# Patient Record
Sex: Female | Born: 1950 | Race: White | Hispanic: No | Marital: Married
Health system: Midwestern US, Community
[De-identification: ages and names within clinical notes are randomized; demographics above are authoritative.]

## PROBLEM LIST (undated history)

## (undated) DIAGNOSIS — R011 Cardiac murmur, unspecified: Secondary | ICD-10-CM

## (undated) DIAGNOSIS — L89154 Pressure ulcer of sacral region, stage 4: Secondary | ICD-10-CM

## (undated) DIAGNOSIS — I509 Heart failure, unspecified: Secondary | ICD-10-CM

## (undated) DIAGNOSIS — I10 Essential (primary) hypertension: Secondary | ICD-10-CM

## (undated) DIAGNOSIS — C541 Malignant neoplasm of endometrium: Secondary | ICD-10-CM

## (undated) DIAGNOSIS — E119 Type 2 diabetes mellitus without complications: Secondary | ICD-10-CM

## (undated) DIAGNOSIS — M47816 Spondylosis without myelopathy or radiculopathy, lumbar region: Secondary | ICD-10-CM

## (undated) DIAGNOSIS — Z978 Presence of other specified devices: Secondary | ICD-10-CM

## (undated) DIAGNOSIS — F32A Depression, unspecified: Secondary | ICD-10-CM

## (undated) DIAGNOSIS — F329 Major depressive disorder, single episode, unspecified: Secondary | ICD-10-CM

## (undated) DIAGNOSIS — Z8669 Personal history of other diseases of the nervous system and sense organs: Secondary | ICD-10-CM

## (undated) DIAGNOSIS — I5023 Acute on chronic systolic (congestive) heart failure: Secondary | ICD-10-CM

## (undated) DIAGNOSIS — F419 Anxiety disorder, unspecified: Secondary | ICD-10-CM

## (undated) DIAGNOSIS — D649 Anemia, unspecified: Secondary | ICD-10-CM

## (undated) DIAGNOSIS — I7789 Other specified disorders of arteries and arterioles: Secondary | ICD-10-CM

## (undated) DIAGNOSIS — G4733 Obstructive sleep apnea (adult) (pediatric): Secondary | ICD-10-CM

## (undated) DIAGNOSIS — Z960 Presence of urogenital implants: Secondary | ICD-10-CM

## (undated) DIAGNOSIS — R609 Edema, unspecified: Secondary | ICD-10-CM

## (undated) DIAGNOSIS — K760 Fatty (change of) liver, not elsewhere classified: Secondary | ICD-10-CM

## (undated) DIAGNOSIS — G8220 Paraplegia, unspecified: Secondary | ICD-10-CM

## (undated) HISTORY — PX: TONSILLECTOMY: SUR1361

## (undated) HISTORY — DX: Pressure ulcer of sacral region, stage 4: L89.154

## (undated) HISTORY — PX: DILATION AND CURETTAGE OF UTERUS: SHX78

## (undated) HISTORY — DX: Other specified disorders of arteries and arterioles: I77.89

## (undated) HISTORY — PX: JOINT REPLACEMENT: SHX530

## (undated) HISTORY — PX: FRACTURE SURGERY: SHX138

---

## 1999-10-01 DIAGNOSIS — C541 Malignant neoplasm of endometrium: Secondary | ICD-10-CM

## 1999-10-01 HISTORY — DX: Malignant neoplasm of endometrium: C54.1

## 1999-10-01 HISTORY — PX: VAGINAL HYSTERECTOMY: SUR661

## 2003-10-01 HISTORY — PX: FEMUR FRACTURE SURGERY: SHX633

## 2013-09-14 LAB — URINALYSIS W/ RFLX MICROSCOPIC
Bilirubin: NEGATIVE
Glucose: NEGATIVE mg/dL
Ketone: NEGATIVE mg/dL
Nitrites: POSITIVE — AB
Protein: NEGATIVE mg/dL
Specific gravity: 1.015 (ref 1.005–1.030)
Urobilinogen: 0.2 EU/dL (ref 0.1–1.0)
pH (UA): 6.5 (ref 4.5–8.0)

## 2013-09-14 LAB — METABOLIC PANEL, COMPREHENSIVE
A-G Ratio: 1.1 (ref 1.0–1.5)
ALT (SGPT): 39 U/L (ref 12–78)
AST (SGOT): 21 U/L (ref 15–37)
Albumin: 4.4 g/dL (ref 3.4–5.0)
Alk. phosphatase: 86 U/L (ref 50–136)
Anion gap: 11 mmol/L (ref 8–20)
BUN: 24 mg/dL — ABNORMAL HIGH (ref 7–18)
Bilirubin, total: 1.1 mg/dL — ABNORMAL HIGH (ref 0.2–1.0)
CO2: 28 mmol/L (ref 21–32)
Calcium: 11.9 mg/dL — ABNORMAL HIGH (ref 8.5–10.1)
Chloride: 102 mmol/L (ref 98–107)
Creatinine: 0.9 mg/dL (ref 0.6–1.3)
GFR est AA: >60 mL/min/{1.73_m2} (ref >60)
GFR est non-AA: >60 mL/min/{1.73_m2} (ref >60)
Globulin: 4 g/dL (ref 2.5–5.0)
Glucose: 137 mg/dL — ABNORMAL HIGH (ref 74–106)
Potassium: 4 mmol/L (ref 3.5–5.1)
Protein, total: 8.4 g/dL — ABNORMAL HIGH (ref 6.4–8.2)
Sodium: 141 mmol/L (ref 136–145)

## 2013-09-14 LAB — TSH 3RD GENERATION: TSH: 2.67 u[IU]/mL (ref 0.360–3.740)

## 2013-09-14 LAB — LIPID PANEL
CHOL/HDL Ratio: 3.7
Cholesterol, total: 191 mg/dL (ref <200)
HDL Cholesterol: 51 mg/dL (ref 40–60)
LDL, calculated: 113 MG/DL (ref 0–130)
Triglyceride: 135 mg/dL (ref <150)
VLDL, calculated: 27 MG/DL (ref 3–35)

## 2013-09-14 LAB — CBC W/O DIFF
HCT: 50.3 % — ABNORMAL HIGH (ref 37.0–47.0)
HGB: 16.5 g/dL — ABNORMAL HIGH (ref 12.0–16.0)
MCH: 28.3 PG (ref 27.0–31.0)
MCHC: 32.8 g/dL (ref 30.5–36.0)
MCV: 86.3 FL (ref 81.0–100.0)
MPV: 11.6 FL (ref 10.2–12.7)
PLATELET: 182 10*3/uL (ref 122–400)
RBC: 5.83 M/uL — ABNORMAL HIGH (ref 4.20–5.40)
RDW: 14.8 % — ABNORMAL HIGH (ref 11.4–14.6)
WBC: 11.3 10*3/uL — ABNORMAL HIGH (ref 4.8–10.8)

## 2013-09-14 LAB — URINE MICROSCOPIC

## 2013-09-14 LAB — VITAMIN D, 25 HYDROXY: Vitamin D 25-Hydroxy: 17.8 ng/mL

## 2013-09-14 LAB — HEMOGLOBIN A1C WITH EAG: Hemoglobin A1c: 5.2 % (ref 4.2–6.3)

## 2013-09-14 LAB — VITAMIN B12: Vitamin B12: 1403 pg/mL — ABNORMAL HIGH (ref 193–986)

## 2013-09-22 ENCOUNTER — Encounter

## 2013-10-03 LAB — PHOSPHORUS: Phosphorus: 2.7 mg/dL (ref 2.5–4.9)

## 2013-10-03 LAB — METABOLIC PANEL, BASIC
Anion gap: 8 mmol/L (ref 8–20)
BUN: 20 mg/dL — ABNORMAL HIGH (ref 7–18)
CO2: 28 mmol/L (ref 21–32)
Calcium: 11.2 mg/dL — ABNORMAL HIGH (ref 8.5–10.1)
Chloride: 106 mmol/L (ref 98–107)
Creatinine: 0.8 mg/dL (ref 0.6–1.3)
GFR est AA: >60 mL/min/{1.73_m2} (ref >60)
GFR est non-AA: >60 mL/min/{1.73_m2} (ref >60)
Glucose: 117 mg/dL — ABNORMAL HIGH (ref 74–106)
Potassium: 4.1 mmol/L (ref 3.5–5.1)
Sodium: 142 mmol/L (ref 136–145)

## 2013-10-03 LAB — MAGNESIUM: Magnesium: 2.1 mg/dL (ref 1.8–2.4)

## 2013-10-03 MED ORDER — ACETAMINOPHEN 325 MG TABLET
325 mg | Freq: Four times a day (QID) | ORAL | Status: DC | PRN
Start: 2013-10-03 — End: 2013-10-03
  Administered 2013-10-03: 20:00:00 via ORAL

## 2013-10-03 MED ORDER — IBUPROFEN 400 MG TAB
400 mg | Freq: Once | ORAL | Status: AC
Start: 2013-10-03 — End: 2013-10-03
  Administered 2013-10-03: 20:00:00 via ORAL

## 2013-10-03 NOTE — ED Notes (Signed)
Patient ate 100 percent of lunch.

## 2013-10-03 NOTE — ED Notes (Addendum)
Patient is awake, alert, and oriented, speech is clear and patient is and ready for discharge. Report to medics.  Verbal and written discharge instructions provided and patient  has the cognitive understanding of discharge instructions. Discharged home with medics via ambulance. All questions answered.

## 2013-10-03 NOTE — ED Notes (Signed)
Patient received in ED 3 from EMS.  Patient states that she has been having trouble with her knees for the past 2 weeks.  Patient states that "they feel like jelly".  Patient states that she fell last night while in the bathroom and injured the toes of her left foot and her left hand.  Patient states that EMS was called to her house and they were able to help her up - but today the pain was worse and her son called 911 for patient to be evaluated in the ED.  Patient being seen by MD at this time.

## 2013-10-03 NOTE — ED Notes (Signed)
Pt is resting on stretcher.  eval by md.  xrays ordered.
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
## 2013-10-03 NOTE — ED Notes (Signed)
Dr. Link SnufferXie.  Was at bedside to re eval.  Plan is for pt to go home via ambulance.  [facility]  Will be here in 30 min.
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
## 2013-10-04 NOTE — Progress Notes (Signed)
Spoke with Dr. Link SnufferXie who saw patient yesterday in ER, request that a homecare referral be done.  Patient has [employer], [employer] participating, referral done and faxed to [facility] for visiting nurse and home PT. I spoke with Sheri Fry and made him aware that referral was sent and gave him phone number for [employer].

## 2013-10-18 ENCOUNTER — Inpatient Hospital Stay
Admit: 2013-10-18 | Discharge: 2013-10-22 | Disposition: A | Discharge status: Skilled Nursing Facility | Payer: BLUE CROSS/BLUE SHIELD | Attending: Internal Medicine | Admitting: Internal Medicine

## 2013-10-18 DIAGNOSIS — M6281 Muscle weakness (generalized): Secondary | ICD-10-CM

## 2013-10-18 LAB — METABOLIC PANEL, COMPREHENSIVE
A-G Ratio: 1.1 (ref 1.0–1.5)
ALT (SGPT): 37 U/L (ref 12–78)
AST (SGOT): 32 U/L (ref 15–37)
Albumin: 4.2 g/dL (ref 3.4–5.0)
Alk. phosphatase: 121 U/L (ref 50–136)
Anion gap: 11 mmol/L (ref 8–20)
BUN: 19 mg/dL — ABNORMAL HIGH (ref 7–18)
Bilirubin, total: 1 mg/dL (ref 0.2–1.0)
CO2: 26 mmol/L (ref 21–32)
Calcium: 11.6 mg/dL — ABNORMAL HIGH (ref 8.5–10.1)
Chloride: 103 mmol/L (ref 98–107)
Creatinine: 1.1 mg/dL (ref 0.6–1.3)
GFR est AA: >60 mL/min/{1.73_m2} (ref >60)
GFR est non-AA: 54 mL/min/{1.73_m2} — ABNORMAL LOW (ref >60)
Globulin: 3.9 g/dL (ref 2.5–5.0)
Glucose: 132 mg/dL — ABNORMAL HIGH (ref 74–106)
Potassium: 4.3 mmol/L (ref 3.5–5.1)
Protein, total: 8.1 g/dL (ref 6.4–8.2)
Sodium: 140 mmol/L (ref 136–145)

## 2013-10-18 LAB — CBC WITH AUTOMATED DIFF
ABS. BASOPHILS: 0.4 10*3/uL
ABS. EOSINOPHILS: 0.3 10*3/uL
ABS. LYMPHOCYTES: 5.5 10*3/uL
ABS. MONOCYTES: 0.6 10*3/uL
ABS. NEUTROPHILS: 6.1 10*3/uL
ATYPICAL LYMPHS: 4 %
BASOPHILS: 3 % — ABNORMAL HIGH (ref 0–1)
EOSINOPHILS: 2 % (ref 0–2)
HCT: 48.8 % — ABNORMAL HIGH (ref 37.0–47.0)
HGB: 16.1 g/dL — ABNORMAL HIGH (ref 12.0–16.0)
LYMPHOCYTES: 39 % (ref 21–51)
MCH: 28.4 PG (ref 27.0–31.0)
MCHC: 33 g/dL (ref 30.5–36.0)
MCV: 86.2 FL (ref 81.0–100.0)
MONOCYTES: 5 % (ref 2–9)
MPV: 11.3 FL (ref 10.2–12.7)
NEUTROPHILS: 47 % (ref 42–75)
PLATELET ESTIMATE: ADEQUATE
PLATELET: 202 10*3/uL (ref 122–400)
RBC: 5.66 M/uL — ABNORMAL HIGH (ref 4.20–5.40)
RDW: 14.9 % — ABNORMAL HIGH (ref 11.4–14.6)
WBC: 12.9 10*3/uL — ABNORMAL HIGH (ref 4.8–10.8)

## 2013-10-18 LAB — BNP: BNP: 16 pg/mL (ref 0–100)

## 2013-10-18 NOTE — Progress Notes (Signed)
Pt received from ED via stretcher. Alert and oriented x3. Vitals stable. Eating and resting comfortably. Will continue to monitor.

## 2013-10-18 NOTE — Other (Addendum)
TRANSFER - IN REPORT:    Verbal report received from Susan McDowell(name) on Sheri Fry  being received from ED(unit) for routine progression of care      Report consisted of patient???s Situation, Background, Assessment and   Recommendations(SBAR).     Information from the following report(s) SBAR, Kardex and MAR was reviewed with the receiving nurse.    Opportunity for questions and clarification was provided.      Assessment completed upon patient???s arrival to unit and care assumed.

## 2013-10-18 NOTE — ED Notes (Signed)
Bedside and Verbal shift change report given to Lucius ConnEmily Kearns, RN (oncoming nurse) by Holli Humblesandy Pinkham, RN (offgoing nurse). Report included the following information SBAR and ED Summary.

## 2013-10-18 NOTE — H&P (Signed)
[facility]ator[facility] Systems  [facility]    History and Physical Examination      NAME:  Sheri FilbertRosita Fry   DOB:   [birth year]   MRN:   [id number]     Date/Time:  10/18/2013  ________________________________________________________    Chief Complaint:  Leg Pain  .  Primary Care Provider: Jabier GaussShuang-ping Wang, MD MD  History of Present Illness:  This is a 63 y.o. year old female with a h/o morbid obesity, HTN, high cholesterol, OSAHS, hypokalemia, lymphedema,  and vit D deficiency presented with c/o inability to ambulate and do minimal chores. As per pt and husband pt has been almost bed bound for the last month and has had difficulties using the commode. Her quality of life has been quickly deteriorating, and she had been depressed. Has been gaining weight.  Denies SOB, no cough, no CP or wheezing.  No n/v/d. No f/c/s.  No dizziness. Pt lives with husband who is a wheel chair bound.      Past Medical History:   has a past medical history of Hypertension; Obesity; High cholesterol; Hypokalemia; Vitamin D deficiency; and Sleep apnea.   Surgical History:  Past Surgical History   Procedure Laterality Date   ??? Hx hip fracture tx Right 2006   ??? Hx femur fracture tx Bilateral 2006      Social History:  The patient  reports that she has never smoked. She does not have any smokeless tobacco history on file. She reports that  drinks alcohol. She reports that she does not use illicit drugs.   Family History:  family history is not on file.    Home Medications:  Prior to Admission Medications   Outpatient Medications Last Dose Informant Patient Reported? Taking?   aspirin delayed-release 81 mg tablet   Yes No   Sig: Take 81 mg by mouth daily.   furosemide (LASIX) 40 mg tablet   Yes No   Sig: Take 40 mg by mouth daily.   metoprolol (LOPRESSOR) 100 mg tablet   Yes No   Sig: Take 100 mg by mouth daily.   potassium chloride SR (KLOR-CON 10) 10 mEq tablet   Yes No   Sig: Take 20 mEq by mouth two (2) times a day.    simvastatin (ZOCOR) 10 mg tablet   Yes No   Sig: Take 10 mg by mouth nightly.      Facility-Administered Medications: None       Review of Systems:  Constitutional:  No fever,chills, or night sweats. + Fatigue and weight gain.   HEENT:  No vision changes or headaches. No hearing loss  Respiratory:   No cough, no audible wheeze, respirations regular.  Cardiovascular:  No palpitations.  Gastrointestinal:  No vomiting, diarrhea or constipation.   Genitourinary:  No dysuria or hematuria.  Metabolic/Endocrine:  No polyuria, polydypsia, or polyphagia. No cold/ heat intolerance.  Neuro/Psychiatric:  No dizziness, no emotional disturbances.      Physical Examination:       General alert, well appearing, and in no distress.  Obese.    Vital signs  Blood pressure 184/74, pulse 78, temperature 98.3 ??F (36.8 ??C), resp. rate 20, height 5\' 2"  (1.575 m), weight 121.564 kg (268 lb), SpO2 97.00%.    Mental status alert, oriented to person, place, and time    Neck No JVD, Neck is short, supple and thick , no significant adenopathy    Chest clear to auscultation, no wheezes, rales or rhonchi, symmetric air  entry    Heart normal rate, regular rhythm, normal S1, S2, no murmurs, rubs, clicks or gallops    Abdomen Obese. Soft, nontender, nondistended, no masses or organomegaly    Neurological alert, oriented, normal speech, no focal findings or movement disorder noted    Extremity peripheral pulses normal,, no clubbing or cyanosis.  Bilateral Lymphedema (non-pitting.     Skin normal coloration and turgor, no rashes, no suspicious skin lesions noted        LABS    Labs: Results:       Chemistry Recent Labs      10/18/13   1800   GLU  132*   NA  140   K  4.3   CL  103   CO2  26   BUN  19*   CREA  1.1   CA  11.6*   AGAP  11   AP  121   TP  8.1   ALB  4.2   GLOB  3.9   AGRAT  1.1      CBC w/Diff Recent Labs      10/18/13   1800   WBC  12.9*   RBC  5.66*   HGB  16.1*   HCT  48.8*   PLT  202   GRANS  47   LYMPH  39   EOS  2      Cardiac  Enzymes No results found for this basename: CPK, CKRMB, CKND1, TROIP, MYO,  in the last 72 hours   Coagulation No results found for this basename: PTP, INR, APTT,  in the last 72 hours    Liver Enzymes Recent Labs      10/18/13   1800   TP  8.1   ALB  4.2   AP  121   SGOT  32      Urine Analysis Color   Date Value Range Status   09/14/2013 YELLOW  YEL   Final        Appearance   Date Value Range Status   09/14/2013 HAZY* CLEAR   Final        pH (UA)   Date Value Range Status   09/14/2013 6.5  4.5 - 8.0   Final        Protein   Date Value Range Status   09/14/2013 NEGATIVE   NEG mg/dL Final        Ketone   Date Value Range Status   09/14/2013 NEGATIVE   NEG mg/dL Final        Bilirubin   Date Value Range Status   09/14/2013 NEGATIVE   NEG   Final        Blood   Date Value Range Status   09/14/2013 TRACE* NEG   Final        Urobilinogen   Date Value Range Status   09/14/2013 0.2  0.1 - 1.0 EU/dL Final        Nitrites   Date Value Range Status   09/14/2013 POSITIVE* NEG   Final        Leukocyte Esterase   Date Value Range Status   09/14/2013 MODERATE* NEG   Final      BNP Recent Labs      10/18/13   1800   BNPP  16                imaging:      Results from Hospital Encounter encounter on 10/03/13   XR KNEE  LT 3 V   Narrative Radiographs of the LEFT knee     performed on 10/03/2013 11:37 AM     for patient      female of 62 years    CLINICAL INFORMATION:  Pain     TECHNIQUE:  Frontal, lateral and oblique views of the knee were obtained.    FINDINGS:  No prior exams are available for comparison    Intramedullary rod in the femur.  No fracture is seen.  No joint effusion is   found.  No loose body is identified.  Medial and patellofemoral compartments are  narrowed.  Patellar spurring is.      There are dystrophic calcifications in the soft tissues below the knee..  No   radiopaque foreign body is seen.           Impression IMPRESSION: No fracture demonstrated.      THIS DOCUMENT HAS BEEN ELECTRONICALLY SIGNED BY Viviann Spare  LEFFLER MD        Assessment/Plan:   Active Problems:    * No active hospital problems. *   This is a 63 y.o. year old female with a h/o morbid obesity, HTN, high cholesterol, OSAHS, hypokalemia, lymphedema,  and vit D deficiency, now with:  1. Debility, deconditioning,  and deteriorating QOL.  2. Morbid obesity.  3. OSAHS.  4. HTN.  5. Hypercholesterolemia.  6. DJD    Plan:  Admit to observation for placement as family could not take care of patient and pt is at high risk of fall.   Will use BiPAP to sleep with 16/8. Pt will benefit from NPSG and CPAP titration as an out patient.  Restart ASA, Lopressor, Lasix and simvastatin.  Low Na diet.  DVT prophylaxis.                Prophylaxis:  [] Lovenox  [] Coumadin  [x] Hep SQ  [x] SCD???s  [] H2B/PPI      Care Plan discussed with:    [x] Patient   [x] Family    [] Care Manager    [] Nursing   [] Consultant/Specialist :      Disposition:  [] Home w/ Family   [] HH PT,OT,RN   [] SNF/LTC   [x] SAH/Rehab        Dede Query, MD  October 18, 2013

## 2013-10-18 NOTE — ED Notes (Signed)
Pt c/o difficulty walking over past month, currently being seen by pt

## 2013-10-18 NOTE — Other (Signed)
TRANSFER - OUT REPORT:    Verbal report given to Jenness Corneriffany Oszmanski on [facility] of [facility]       Report consisted of patient???s Situation, Background, Assessment and   Recommendations(SBAR).     Information from the following report(s) SBAR was reviewed with the receiving nurse.    Opportunity for questions and clarification was provided.

## 2013-10-18 NOTE — ED Provider Notes (Signed)
HPI Comments: Patient states she has been having problems ambulating in the home and has been extremely depressed due to that    Patient is a 63 y.o. female presenting with leg pain. The history is provided by the patient. No language interpreter was used.   Leg Pain   This is a recurrent problem. The problem occurs constantly. The problem has been rapidly worsening. The pain is present in the left lower leg and right lower leg. The quality of the pain is described as aching. The pain is mild. Pertinent negatives include no numbness and no back pain.        Past Medical History   Diagnosis Date   ??? Hypertension    ??? Obesity    ??? High cholesterol    ??? Hypokalemia    ??? Vitamin D deficiency    ??? Sleep apnea         Past Surgical History   Procedure Laterality Date   ??? Hx hip fracture tx Right 2006   ??? Hx femur fracture tx Bilateral 2006         History reviewed. No pertinent family history.     History     Social History   ??? Marital Status: MARRIED     Spouse Name: N/A     Number of Children: N/A   ??? Years of Education: N/A     Occupational History   ??? Not on file.     Social History Main Topics   ??? Smoking status: Never Smoker    ??? Smokeless tobacco: Not on file   ??? Alcohol Use: Yes      Comment: Rare   ??? Drug Use: No   ??? Sexually Active: Not on file     Other Topics Concern   ??? Not on file     Social History Narrative   ??? No narrative on file                  ALLERGIES: Pcn      Review of Systems   Constitutional: Positive for fatigue. Negative for chills and appetite change.   HENT: Negative for congestion, sore throat, postnasal drip and sinus pressure.    Eyes: Negative for pain and discharge.   Respiratory: Negative.  Negative for cough, chest tightness and shortness of breath.    Cardiovascular: Negative.  Negative for chest pain, palpitations and leg swelling.   Gastrointestinal: Negative.  Negative for nausea, vomiting, abdominal pain, diarrhea and constipation.   Endocrine: Negative for cold intolerance and  polydipsia.   Genitourinary: Negative for dysuria, frequency and flank pain.   Musculoskeletal: Positive for joint swelling and gait problem. Negative for back pain.        Both leg swelling and pain     Skin: Negative for rash and wound.   Neurological: Negative for dizziness, light-headedness, numbness and headaches.   Psychiatric/Behavioral: Negative for confusion. The patient is not nervous/anxious.    All other systems reviewed and are negative.        Filed Vitals:    10/18/13 1756   BP: 184/74   Pulse: 78   Temp: 98.3 ??F (36.8 ??C)   Resp: 20   Height: 5' 2"  (1.575 m)   Weight: 121.564 kg (268 lb)   SpO2: 97%            Physical Exam   Nursing note and vitals reviewed.  Constitutional: She is oriented to person, place, and time. She appears well-developed  and well-nourished.   HENT:   Head: Normocephalic and atraumatic.   Eyes: Conjunctivae and EOM are normal.   Neck: Neck supple.   Pulmonary/Chest: Effort normal and breath sounds normal. No respiratory distress. She has no wheezes. She has no rales. She exhibits no tenderness.   Abdominal: Soft. Bowel sounds are normal. She exhibits no distension and no mass. There is no tenderness. There is no rebound and no guarding.   Musculoskeletal: Normal range of motion. She exhibits edema and tenderness.   3+ edema of bilateral legs,non pitting   Neurological: She is alert and oriented to person, place, and time.   Skin: Skin is warm and dry.      The following are your lab and/or imaging results:  Recent Results (from the past 24 hour(s))   CBC WITH AUTOMATED DIFF    Collection Time     10/18/13  6:00 PM       Result Value Range    WBC 12.9 (*) 4.8 - 10.8 K/uL    RBC 5.66 (*) 4.20 - 5.40 M/uL    HGB 16.1 (*) 12.0 - 16.0 g/dL    HCT 48.8 (*) 37.0 - 47.0 %    MCV 86.2  81.0 - 100.0 FL    MCH 28.4  27.0 - 31.0 PG    MCHC 33.0  30.5 - 36.0 g/dL    RDW 14.9 (*) 11.4 - 14.6 %    PLATELET 202  122 - 400 K/uL    MPV 11.3  10.2 - 12.7 FL    NEUTROPHILS 47  42 - 75 %     LYMPHOCYTES 39  21 - 51 %    ATYPICAL LYMPHS 4      MONOCYTES 5  2 - 9 %    EOSINOPHILS 2  0 - 2 %    BASOPHILS 3 (*) 0 - 1 %    ABS. NEUTROPHILS 6.1      ABS. LYMPHOCYTES 5.5      ABS. MONOCYTES 0.6      ABS. BASOPHILS 0.4      ABS. EOSINOPHILS 0.3      RBC COMMENTS 1+ (*) NORMO    RBC COMMENTS ANISOCYTOSIS (*) NORMO      PLATELET ESTIMATE ADEQUATE  ADEQ      DF MANUAL     METABOLIC PANEL, COMPREHENSIVE    Collection Time     10/18/13  6:00 PM       Result Value Range    Sodium 140  136 - 145 mmol/L    Potassium 4.3  3.5 - 5.1 mmol/L    Chloride 103  98 - 107 mmol/L    CO2 26  21 - 32 mmol/L    Anion gap 11  8 - 20 mmol/L    Glucose 132 (*) 74 - 106 mg/dL    BUN 19 (*) 7 - 18 mg/dL    Creatinine 1.1  0.6 - 1.3 mg/dL    GFR est AA >60  >60 ml/min/1.21m    GFR est non-AA 54 (*) >60 ml/min/1.720m   Calcium 11.6 (*) 8.5 - 10.1 mg/dL    Bilirubin, total 1.0  0.2 - 1.0 mg/dL    ALT 37  12 - 78 U/L    AST 32  15 - 37 U/L    Alk. phosphatase 121  50 - 136 U/L    Protein, total 8.1  6.4 - 8.2 g/dL    Albumin 4.2  3.4 - 5.0 g/dL  Globulin 3.9  2.5 - 5.0 g/dL    A-G Ratio 1.1  1.0 - 1.5     BNP    Collection Time     10/18/13  6:00 PM       Result Value Range    BNP 16  0 - 100 pg/mL     No results found.    Thank you for choosing our Emergency Department for your care.  Please understand that the emergency care received is not intended to be complete and definitive medical care and treatment. By signing our discharge, you acknowledge that have received instructions for your care, on any prescribed medications, and on follow up and will follow these recommendations for continued and complete medical diagnosis, care and treatment.  Please update your current medication list and please show this to your follow up care providers.  Please understand that interpretations of any imaging or diagnostic study may be preliminary, and that follow up to obtain final results as well as to obtain definitive medical care is important to  your well being.   Should your condition or symptoms persist or worsen at any time, please return to the emergency department or see your primary physician. Finally, please  understand that your signature authorizes this [facility] to release all or any part of my medical record to follow up physicians and providers.  We wish you good health.  Please follow up as recommended.  <EMERGENCY DEPARTMENT CASE SUMMARY>  8:05 PM    D/W DR HMIDI about admission    Impression/Differential Diagnosis: 1)Generalized weakness 2) Multiple falls 3) CHF 4) LYMPHADENITIS    Plan: ADMIT      Final Impression/Diagnosis: WEAKNESS/MULTIPLE FALLS    Patient condition at time of disposition: STABLE      I have reviewed the following home medications:    Prior to Admission medications    Medication Sig Start Date End Date Taking? Authorizing Provider   aspirin delayed-release 81 mg tablet Take 81 mg by mouth daily.    Phys Other, MD   furosemide (LASIX) 40 mg tablet Take 40 mg by mouth daily.    Phys Other, MD   metoprolol (LOPRESSOR) 100 mg tablet Take 100 mg by mouth daily.    Phys Other, MD   simvastatin (ZOCOR) 10 mg tablet Take 10 mg by mouth nightly.    Phys Other, MD   potassium chloride SR (KLOR-CON 10) 10 mEq tablet Take 20 mEq by mouth two (2) times a day.    Phys Other, MD         Dewain Penning, MD         MDM    Procedures

## 2013-10-19 MED ORDER — SIMVASTATIN 10 MG TAB
10 mg | Freq: Every evening | ORAL | Status: DC
Start: 2013-10-19 — End: 2013-10-22
  Administered 2013-10-19 – 2013-10-22 (×5): via ORAL

## 2013-10-19 MED ORDER — ASPIRIN 81 MG TAB, DELAYED RELEASE
81 mg | Freq: Every day | ORAL | Status: DC
Start: 2013-10-19 — End: 2013-10-22
  Administered 2013-10-19 – 2013-10-22 (×4): via ORAL

## 2013-10-19 MED ORDER — FUROSEMIDE 40 MG TAB
40 mg | Freq: Every day | ORAL | Status: DC
Start: 2013-10-19 — End: 2013-10-21
  Administered 2013-10-19 – 2013-10-21 (×3): via ORAL

## 2013-10-19 MED ORDER — METOPROLOL TARTRATE 100 MG TAB
100 mg | Freq: Every day | ORAL | Status: DC
Start: 2013-10-19 — End: 2013-10-22
  Administered 2013-10-19 – 2013-10-22 (×4): via ORAL

## 2013-10-19 MED ADMIN — docusate sodium (COLACE) capsule 100 mg: ORAL | @ 14:00:00 | NDC 63739047810

## 2013-10-19 MED ADMIN — docusate sodium (COLACE) capsule 100 mg: ORAL | @ 23:00:00 | NDC 63739047810

## 2013-10-19 MED ADMIN — heparin (porcine) injection 5,000 Units: SUBCUTANEOUS | @ 05:00:00 | NDC 25021040201

## 2013-10-19 MED ADMIN — heparin (porcine) injection 5,000 Units: SUBCUTANEOUS | @ 23:00:00 | NDC 25021040201

## 2013-10-19 MED ADMIN — heparin (porcine) injection 5,000 Units: SUBCUTANEOUS | @ 13:00:00 | NDC 25021040201

## 2013-10-19 MED FILL — ASPIRIN 81 MG TAB, DELAYED RELEASE: 81 mg | ORAL | Qty: 1

## 2013-10-19 MED FILL — METOPROLOL TARTRATE 100 MG TAB: 100 mg | ORAL | Qty: 1

## 2013-10-19 MED FILL — SIMVASTATIN 10 MG TAB: 10 mg | ORAL | Qty: 1

## 2013-10-19 MED FILL — HEPARIN (PORCINE) 5,000 UNIT/ML IJ SOLN: 5000 unit/mL | INTRAMUSCULAR | Qty: 1

## 2013-10-19 MED FILL — FUROSEMIDE 40 MG TAB: 40 mg | ORAL | Qty: 1

## 2013-10-19 MED FILL — DOCUSATE SODIUM 100 MG CAP: 100 mg | ORAL | Qty: 1

## 2013-10-19 NOTE — Progress Notes (Signed)
Problem: Patient Education: Go to Patient Education Activity  Goal: Patient/Family Education  Physical Therapy Goals 8 visits.  Initiated 10/19/2013  1. Patient will move from supine to sit and sit to supine in bed with moderate assistance within 8 visits.   2. Patient will be able to roll to the L ind with bedrail within 8 visits.  3. Pt. Will be able to sit unsupported at EOB 5 minutes within 8 visits.  PHYSICAL THERAPY EVALUATION    Patient: Sheri Fry 1(62 y.o. female)  Date: 10/19/2013  Primary Diagnosis: debility        Precautions:   Fall      ASSESSMENT :  Based on the objective data described below, the patient presents with pt. In bed, willing to participate with P.T. Tx. NAD.Marland Kitchen.    Patient will benefit from skilled intervention to address the above impairments.  Patient???s rehabilitation potential is considered to be Fair  Factors which may influence rehabilitation potential include:   [ ]          None noted  [ ]          Mental ability/status  [X]          Medical condition  [ ]          Home/family situation and support systems  [ ]          Safety awareness  [ ]          Pain tolerance/management  [ ]          Other:        PLAN :  Recommendations and Planned Interventions:  [X]            Bed Mobility Training             [ ]     Neuromuscular Re-Education  [ ]            Transfer Training                   [ ]     Orthotic/Prosthetic Training  [ ]            Gait Training                         [ ]     Modalities  [X]            Therapeutic Exercises           [ ]     Edema Management/Control  [ ]            Therapeutic Activities            [ ]     Patient and Family Training/Education  [ ]            Other (comment):    Frequency/Duration: Patient will be followed by physical therapy daily to address goals.  Discharge Recommendations: Skilled Nursing Facility  Further Equipment Recommendations for Discharge: hospital bed and mechanical lift       SUBJECTIVE:   Patient stated ???I'm afraid of falling.???      OBJECTIVE  DATA SUMMARY:       Past Medical History   Diagnosis Date   ??? Hypertension     ??? Obesity     ??? High cholesterol     ??? Hypokalemia     ??? Vitamin D deficiency     ??? Sleep apnea     ??? Ill-defined condition       Past Surgical History   Procedure  Laterality Date   ??? Hx hip fracture tx Right 2006   ??? Hx femur fracture tx Bilateral 2006     Prior Level of Function/Home Situation:   Home Situation  Home Environment: Private residence  # Steps to Enter: 1  Rails to Enter: Yes  Hand Rails : Bilateral  Wheelchair Ramp: No  One/Two Story Residence: Two story  Living Alone: No  Support Systems: Family member(s)  Patient Expects to be Discharged to:: Private residence  Current DME Used/Available at Home: CPAP;Cane, straight;Walker, rolling  Critical Behavior:  Neurologic State: Alert  Orientation Level: Oriented X4  Cognition: Follows commands  Safety/Judgement: Awareness of environment  Skin:       Skin Color: Appropriate for ethnicity (discoloration noted to BLLE)  Skin Condition/Temp: Warm  Skin Integrity: Intact  Strength:    Strength:  (UE's:3+/5; LE's:0-1/5)                    Tone & Sensation:                                  Range Of Motion:  AROM:  (UE's:WFL; LE's: subfct.)                       Functional Mobility:  Bed Mobility:  Rolling:  (to the R:ind with bedrail;to the L with bedrail & mod assist)  Supine to Sit: Maximum assistance;Verbal cues  Sit to Supine: Maximum assistance;Verbal cues  Scooting: Maximum assistance;Verbal cues  Transfers:                             Balance:   Sitting: Impaired;With support  Sitting - Static: Poor (constant support)  Sitting - Dynamic: Not tested  Standing:  (NE)  Ambulation/Gait Training/stair:                   Therapeutic Exercises:   Pt. Performed manual AR ex to UE's , p-aa ROM ex & quad sets to BLE's, 10x each, bedside.  Pain:  Pain Scale 1: Numeric (0 - 10)  Pain Intensity 1: 0              Activity Tolerance:   Pt. tol tx well. LOB post wioth pt. Sitting @ EOB. Pt.  Unable to sit @ EOB unsupported.  Please refer to the flowsheet for vital signs taken during this treatment.  After treatment:   [ ]          Patient left in no apparent distress sitting up in chair  [X]          Patient left in no apparent distress in bed  [X]          Call bell left within reach  [X]          Nursing notified  [ ]          Caregiver present  [ ]          Bed alarm activated      COMMUNICATION/EDUCATION:   The patient???s plan of care was discussed with: Registered Nurse and Child psychotherapist.  [ ]          Fall prevention education was provided and the patient/caregiver indicated understanding.  [ ]          Patient/family have participated as able in goal setting and plan of care.  [X]   Patient/family agree to work toward stated goals and plan of care.  [ ]          Patient understands intent and goals of therapy, but is neutral about his/her participation.  [ ]          Patient is unable to participate in goal setting and plan of care.    Thank you for this referral.  [place]   Time Calculation: 40 mins

## 2013-10-19 NOTE — Other (Signed)
Bedside, Verbal and Written shift change report given to Diana Woglom (oncoming nurse) by Tiffany Oszmanski (offgoing nurse). Report included the following information SBAR, Kardex and MAR.

## 2013-10-19 NOTE — Progress Notes (Signed)
[facility] is still reviewing info, I spoke with patient and her husband and updated them.

## 2013-10-19 NOTE — Progress Notes (Signed)
63 year old female placed on observation for debilitation, deconditioning.  She has been declining over the last month at home and is now unable to ambulate.  I met with patient bedside to discuss discharge plan, she has been to [facility] in the past and would like to return there for rehab.  She understands that insurance will need to approve.  PRI/Screen done and faxed to [employer].  I had called insurance, will need to have an accepting facility and then call for auth.  Elige Radon RN ACM

## 2013-10-19 NOTE — Other (Signed)
Bedside and Verbal shift change report given to Tiffany Oszmanski, RN (oncoming nurse) by DIANA M WOGLOM, RN   (offgoing nurse). Report included the following information SBAR, Kardex, MAR and Recent Results.

## 2013-10-19 NOTE — Progress Notes (Signed)
Hospitalist Progress Note    NAME:  Sheri FilbertRosita Fry   DOB:   [birth year]   MRN:   [id number]     Date/Time:  10/19/2013  Subjective:   Chief Complaint:    Feels fine       Review of Systems:    General: No fevers or chills.  Cardiovascular: No chest pain or pressure. No palpitations.   Pulmonary: No shortness of breath.   Gastrointestinal: No nausea, vomiting    Current Facility-Administered Medications   Medication Dose Route Frequency   ??? docusate sodium (COLACE) capsule 100 mg  100 mg Oral BID   ??? heparin (porcine) injection 5,000 Units  5,000 Units SubCUTAneous Q8H   ??? aspirin delayed-release tablet 81 mg  81 mg Oral DAILY   ??? furosemide (LASIX) tablet 40 mg  40 mg Oral DAILY   ??? metoprolol (LOPRESSOR) tablet 100 mg  100 mg Oral DAILY   ??? simvastatin (ZOCOR) tablet 10 mg  10 mg Oral QHS         Objective:   Vitals:  Visit Vitals   Item Reading   ??? BP 153/90   ??? Pulse 74   ??? Temp 97.9 ??F (36.6 ??C)   ??? Resp 20   ??? Ht 5\' 2"  (1.575 m)   ??? Wt 121.564 kg (268 lb)   ??? BMI 49.01 kg/m2   ??? SpO2 95%   ??? Breastfeeding No      Temp (24hrs), Avg:98.3 ??F (36.8 ??C), Min:97.9 ??F (36.6 ??C), Max:98.4 ??F (36.9 ??C)        PHYSICAL EXAM:  General  Alert, in no acute distress      Lungs:  Wheezing [] YES [x] NO  ,Rales [] YES  [x] No , Raspatory distress[] YES   [x] NO    Heart: Regular  Rhythm S1 , S 2     Abdomen: soft [x] YES   [] NO  ,   Bowl sounds [x] YES   [] NO      Tennder[] YES   [x] NO    Extremities: Edema [] YES   [x] NO,  Local swelling  [] YES   [x] NO    Neurologic:  Alert and oriented X 3.  No acute neurological findings             Labs: Results:       Chemistry Recent Labs      10/18/13   1800   GLU  132*   NA  140   K  4.3   CL  103   CO2  26   BUN  19*   CREA  1.1   CA  11.6*   AGAP  11   AP  121   TP  8.1   ALB  4.2   GLOB  3.9   AGRAT  1.1      CBC w/Diff Recent Labs      10/18/13   1800   WBC  12.9*   RBC  5.66*   HGB  16.1*   HCT  48.8*   PLT  202   GRANS  47   LYMPH  39   EOS  2      Cardiac Enzymes No results found for this  basename: CPK, CKRMB, CKND1, TROIP, MYO,  in the last 72 hours   Coagulation No results found for this basename: PTP, INR, APTT,  in the last 72 hours          Assessment/Plan:     Assessment :   Patient Active Problem List  Diagnosis Code   ??? Morbid obesity with BMI of 45.0-49.9, adult 278.01, V85.42   ??? Fall E888.9       Plan :  Cont present meds   CM aware about placement             Active Problems:    * No active hospital problems. *           ___________________________________________________    Prophylaxis:  [] Lovenox  [] Coumadin  [] Hep SQ  [] SCD???s  [] H2B/PPI    Care Plan discussed with:    [] Patient   [] Family    [] Care Manager    [] Nursing   [] Consultant/Specialist :      Disposition:  [] Home w/ Family   [] HH PT,OT,RN   [] SNF/LTC   [] SAH/Rehab      Wynonia Hazard  Demoni Gergen, MD  October 19, 2013

## 2013-10-20 MED ADMIN — heparin (porcine) injection 5,000 Units: SUBCUTANEOUS | @ 22:00:00 | NDC 00069005904

## 2013-10-20 MED ADMIN — docusate sodium (COLACE) capsule 100 mg: ORAL | @ 22:00:00 | NDC 63739047810

## 2013-10-20 MED ADMIN — heparin (porcine) injection 5,000 Units: SUBCUTANEOUS | @ 05:00:00 | NDC 25021040201

## 2013-10-20 MED ADMIN — docusate sodium (COLACE) capsule 100 mg: ORAL | @ 14:00:00 | NDC 63739047810

## 2013-10-20 MED ADMIN — heparin (porcine) injection 5,000 Units: SUBCUTANEOUS | @ 14:00:00 | NDC 25021040201

## 2013-10-20 MED FILL — SIMVASTATIN 10 MG TAB: 10 mg | ORAL | Qty: 1

## 2013-10-20 MED FILL — ASPIRIN 81 MG TAB, DELAYED RELEASE: 81 mg | ORAL | Qty: 1

## 2013-10-20 MED FILL — METOPROLOL TARTRATE 100 MG TAB: 100 mg | ORAL | Qty: 1

## 2013-10-20 MED FILL — HEPARIN (PORCINE) 5,000 UNIT/ML IJ SOLN: 5000 unit/mL | INTRAMUSCULAR | Qty: 1

## 2013-10-20 MED FILL — DOCUSATE SODIUM 100 MG CAP: 100 mg | ORAL | Qty: 1

## 2013-10-20 MED FILL — FUROSEMIDE 40 MG TAB: 40 mg | ORAL | Qty: 1

## 2013-10-20 NOTE — Other (Signed)
Bedside, Verbal and Written shift change report given to Sharon Degraw (oncoming nurse) by Tiffany Oszmanski (offgoing nurse). Report included the following information SBAR, Kardex and MAR.

## 2013-10-20 NOTE — Progress Notes (Addendum)
RECOMMENDATIONS:   Diet change to:1800 weight loss diet. Provided education for weight reduction       Initial Nutrition Assessment     '[x]'$   MD Consult  $Remove'[x]'vMQvLHu$   MST Screen    '[]'$  Follow-Up    Admitting Dx: debility   Medical Hx:   Past Medical History   Diagnosis Date   ??? Hypertension    ??? Obesity    ??? High cholesterol    ??? Hypokalemia    ??? Vitamin D deficiency    ??? Sleep apnea    ??? Ill-defined condition        Diet Order: Regular   Allergies: none to food  Labs: Results for Sheri, Fry (MRN [id number]) as of 10/20/2013 11:36    10/18/2013 18:00   Glucose  132 (H)   BUN  19 (H)     Nutritionally Significant Meds:none significant    Assessment    Last 3 Recorded Weights in this Encounter    10/18/13 1756   Weight: 121.564 kg (268 lb)     Height: $Remove'5\' 2"'DiDqYxp$  (157.5 cm)  Body mass index is 49.01 kg/(m^2).  UBW:  > 350lb   %UBW:lost 80lbs with Atkins diet  IBW: 110+-10%  %IBW: 221%    Appetite:  $RemoveBe'[x]'EOJivennx$ Goo'[]'$ Fair  $Rem'[]'ldxn$ Poor  % Meal Consumed:  $RemoveBe'[]'GvPdcPayS$ <25%  $Rem'[]'RQEd$ 25-50%  $Remov'[]'IxiToL$ 50-75%  $Remov'[x]'AdXJak$ 75-100%   Patient Vitals for the past 100 hrs:   % Diet Eaten   10/20/13 0834 100 %   10/19/13 1823 100 %   10/19/13 1326 100 %   10/19/13 0828 60 %      Oral/GI Issues:  $Remove'[]'LrFnSrW$ Difficulty chewing  $Remove'[]'bnxzZlw$ Difficulty swallowing  $RemoveBef'[]'WAvqtLIZoT$ Nausea  $Remov'[]'RFGEyb$ Vomiting  $RemoveB'[]'znhGQdAJ$ Diarrhea  $RemoveB'[]'nyBLCXbe$ Constipation  Fluid Intake (past 24hrs):  Net I/O:-78ml  IV: none  Skin: $Remo'[]'uKjBj$ Intact  $Remov'[]'ZMEikw$ Pressure Ulcer  $Remo'[]'KghiJ$ Surgical Wound     Subjective: 63 yr old female presents with debility. She is morbidly obese with BMI of 49.  Has lost weight with Atkins diet plan= 80 pounds.    Nutrition Rx: 2420kcal (20kcal/kg); 121g protein (1g/kg); 3614ml fluid (74ml/kg)  [based on: $RemoveBe'[]'btxAkKHGa$   [employer]  $Rem'[]'MGcC$   [employer]  $RemoveB'[x]'kKDPHzbh$   Kcal/kg]    Nutrition Diagnosis     '[]'$ Increased protein needs r/t    '[]'$ Decreased sodium/fat needs r/t    '[]'$ Nutrition knowledge deficit r/t    '[]'$ Altered Nutrition related labs r/t   '[]'$ Altered GI Function r/t   '[]'$ Inadequate Oral intake r/t   '[]'$ Inadequate Energy Needs r/t   '[]'$ Obesity r/t   '[x]'$ Morbid Obesity r/t decreased  mobility , increased calories in AEB  BMI= 49   '[]'$ Excessive CHO Intake r/t   '[]'$ Swallowing Difficulty r/t   '[]'$ Chewing Difficulty r/t      Goals  $Remo'[]'CHDBO$  Wt maintenance $RemoveBefore'[]'eHlLeWhYnqRbZ$ Wt Gain $Remov'[x]'OMNAcd$ Wt Loss  $Rem'[]'CUxH$  Adherence to diet recommendations  $RemoveBeforeDE'[]'vszDAuwjQunFczn$  Labs improved  $RemoveB'[]'LVAYLGiU$  Improved PO intake  $Remov'[]'YalpAb$  Diet advancement w/in 24-48hrs per MD   '[]'$  Tolerance of current diet      Intervention  Recommend:   '[]'$  Continue with current diet   '[x]'$  Diet change to:1800 wright loss diet   '[]'$  Supplement with:   '[]'$  Nutrition Support:   '[]'$  PO intake encouraged  $RemoveBef'[]'IdPEOxJtvV$  Snacks to be provided  $RemoveB'[]'CPlonyFx$  Food preferences taken  $Remo'[]'jyTiU$  Diet education provided  $RemoveB'[]'gkKKMdBU$  Referred to DM educator     Monitoring and Evaluation  $RemoveBef'[x]'rxiKgVrdsc$  Follow for fluid consumption and PO intake  $Remov'[]'MAIrof$  Monitor labs:   '[]'$ Reassess for additional diet reinforcement  $RemoveBefore'[]'pLzQxXObVZZvC$ Adjust EN/TPN pending lab  values and pt tolerance     _0   No cultural, religious, or ethnic dietary needs identified    _1   Cultural, religious and ethnic food preferences identified and addressed    _2   Participated in care plan, discharge planning/Interdisciplinary rounds     Previous Recommendations:   _3   Implemented  _4  Not Implemented (RD to address) <JGGEZMOQHUTMLYYT>_0<\/PTWSFKCLEXNTZGYF>_7 Not applicable   Previous Goal:    _6   Met  _7   Not Met (RD to CBSWHQP)<RFFMBWGYKZLDJTTS>_1<\/XBLTJQZESPQZRAQT>_6  Not applicable    Sheri Fry

## 2013-10-20 NOTE — Progress Notes (Signed)
Received a call from [facility], they cannot accept, no beds available.  I met with patient bedside and discussed other choices, she is agreeable to any facility that participates with her insurance and would like to stay [place] close as [place], [place] or [facility].  Will contact healthcare stratigies again to see who is participating and fax PRI to all facilities.  Elige Radon RN ACM

## 2013-10-20 NOTE — Progress Notes (Signed)
PRI was faxed to [facility], [facility], [facility] [place] and [place] for review.  [facility] a bed offer [facility] [place], I spoke with patient she would accept if we could get auth, I spoke with Dr. Bryn GullingMir, CT spine being done, patient may be ready tomorrow, Info faxed to health care staratigies, pending auth started.  Will await insurance review.  Earney NavyMichele Gregorio RN ACM

## 2013-10-20 NOTE — Progress Notes (Signed)
Hospitalist Progress Note    NAME:  Sheri Fry   DOB:   1951/08/22   MRN:   [id number]     Date/Time:  10/20/2013  Subjective:   Chief Complaint:    C/o L leg weakness   Not able to walk       Review of Systems:    General: No fevers or chills.  Cardiovascular: No chest pain or pressure. No palpitations.   Pulmonary: No shortness of breath.   Gastrointestinal: No nausea, vomiting    Current Facility-Administered Medications   Medication Dose Route Frequency   ??? docusate sodium (COLACE) capsule 100 mg  100 mg Oral BID   ??? heparin (porcine) injection 5,000 Units  5,000 Units SubCUTAneous Q8H   ??? aspirin delayed-release tablet 81 mg  81 mg Oral DAILY   ??? furosemide (LASIX) tablet 40 mg  40 mg Oral DAILY   ??? metoprolol (LOPRESSOR) tablet 100 mg  100 mg Oral DAILY   ??? simvastatin (ZOCOR) tablet 10 mg  10 mg Oral QHS         Objective:   Vitals:  Visit Vitals   Item Reading   ??? BP 148/72   ??? Pulse 63   ??? Temp 98.1 ??F (36.7 ??C)   ??? Resp 20   ??? Ht 5\' 2"  (1.575 m)   ??? Wt 121.564 kg (268 lb)   ??? BMI 49.01 kg/m2   ??? SpO2 95%   ??? Breastfeeding No      Temp (24hrs), Avg:98.1 ??F (36.7 ??C), Min:98 ??F (36.7 ??C), Max:98.2 ??F (36.8 ??C)        PHYSICAL EXAM:  General  Alert, in no acute distress      Lungs:  Wheezing [] YES [x] NO  ,Rales [] YES  [x] No , Raspatory distress[] YES   [x] NO    Heart: Regular  Rhythm S1 , S 2     Abdomen: soft [x] YES   [] NO  ,   Bowl sounds [x] YES   [] NO      Tennder[] YES   [x] NO    Extremities: Edema [] YES   [x] NO,  Local swelling  [] YES   [] NO    Neurologic:  Alert and oriented X 3.  Cant dorsi flex L foot . Can bend L leg on knee . Sensations are intact             Labs: Results:       Chemistry Recent Labs      10/18/13   1800   GLU  132*   NA  140   K  4.3   CL  103   CO2  26   BUN  19*   CREA  1.1   CA  11.6*   AGAP  11   AP  121   TP  8.1   ALB  4.2   GLOB  3.9   AGRAT  1.1      CBC w/Diff Recent Labs      10/18/13   1800   WBC  12.9*   RBC  5.66*   HGB  16.1*   HCT  48.8*   PLT  202   GRANS  47   LYMPH   39   EOS  2      Cardiac Enzymes No results found for this basename: CPK, CKRMB, CKND1, TROIP, MYO,  in the last 72 hours   Coagulation No results found for this basename: PTP, INR, APTT,  in the last 72 hours  Assessment/Plan:     Assessment :   This is a 63 y/o Morbidly obese F , h/o HTN admitted with Inability to ambulate   She was able to walk with cane Dan Humphreys/walker about a month ago . She fell few weeks ago and since then has not walked .   She has weakness of L leg . She stats that L leg used to be the good leg   She cant dori or plantilfex L foot . She has no back pain   She has normal bowl and bladder control   Plan :  Need w/u of LLE weakness which is contributing towards debility   Will start w/u with CT LS spine .   Cont Lopressor and Lasix   DVT prophylaxis             Active Problems:    * No active hospital problems. *           ___________________________________________________    Prophylaxis:  [] Lovenox  [] Coumadin  [] Hep SQ  [] SCD???s  [] H2B/PPI    Care Plan discussed with:    [] Patient   [] Family    [] Care Manager    [] Nursing   [] Consultant/Specialist :      Disposition:  [] Home w/ Family   [] HH PT,OT,RN   [] SNF/LTC   [] SAH/Rehab      Wynonia HazardKhawaja  Herman Mell, MD  October 20, 2013

## 2013-10-20 NOTE — Other (Signed)
Bedside and Verbal shift change report given to Tiffany Oszmanksi  (oncoming nurse) by Vista LawmanSharon DeGraw (offgoing nurse). Report included the following information SBAR, Kardex, Intake/Output, MAR and Recent Results.

## 2013-10-20 NOTE — Progress Notes (Signed)
physical Therapy TREATMENT  Patient: Sheri FilbertRosita Mair [age] y.o. female)  Date: 10/20/2013  Diagnosis: debility <principal problem not specified>       Precautions: Fall    ASSESSMENT:  Pt. In bed, willing to participate with bedside exercise. Pt. Just returned to room after having CT scan. Pt. Husband visiting.  Progression toward goals:  []     Improving appropriately and progressing toward goals  [x]     Improving slowly and progressing toward goals  []     Not making progress toward goals and plan of care will be adjusted     PLAN:  Patient continues to benefit from skilled intervention to address the above impairments.  Continue treatment per established plan of care.  Discharge Recommendations:  Skilled Nursing Facility  Further Equipment Recommendations for Discharge:  hospital bed, mechanical lift and wheelchair      SUBJECTIVE:   Patient stated ???I want to get strong.Marland Kitchen.???    OBJECTIVE DATA SUMMARY:   Critical Behavior:  Neurologic State: Alert  Orientation Level: Oriented X4  Cognition: Appropriate decision making;Appropriate for age attention/concentration;Appropriate safety awareness  Safety/Judgement: Awareness of environment  Functional Mobility Training:  Bed Mobility:                    Transfers:                                   Balance:     Ambulation/Gait Training:                                                        Stairs:            Neuro Re-Education:    Therapeutic Exercises:   Pt. Performed strengthening/ROM ex to BUE's & LE's, 10x each, bedside.  Pain:  Pain Scale 1: Numeric (0 - 10)  Pain Intensity 1: 0              Activity Tolerance:   Pt. tol tx well.  Please refer to the flowsheet for vital signs taken during this treatment.  After treatment:   []     Patient left in no apparent distress sitting up in chair  [x]     Patient left in no apparent distress in bed  [x]     Call bell left within reach  [x]     Nursing notified  []     Caregiver present  []     Bed alarm activated     COMMUNICATION/COLLABORATION:   The patient???s plan of care was discussed with: Social Worker    BeatriceFOTIS, [place] [phone]   Time Calculation: 15 mins

## 2013-10-21 LAB — METABOLIC PANEL, BASIC
Anion gap: 9 mmol/L (ref 8–20)
BUN: 23 mg/dL — ABNORMAL HIGH (ref 7–18)
CO2: 29 mmol/L (ref 21–32)
Calcium: 11.3 mg/dL — ABNORMAL HIGH (ref 8.5–10.1)
Chloride: 103 mmol/L (ref 98–107)
Creatinine: 1 mg/dL (ref 0.6–1.3)
GFR est AA: >60 mL/min/{1.73_m2} (ref >60)
GFR est non-AA: >60 mL/min/{1.73_m2} (ref >60)
Glucose: 112 mg/dL — ABNORMAL HIGH (ref 74–106)
Potassium: 3.5 mmol/L (ref 3.5–5.1)
Sodium: 141 mmol/L (ref 136–145)

## 2013-10-21 LAB — CBC WITH AUTOMATED DIFF
ABS. BASOPHILS: 0.1 10*3/uL (ref 0.0–0.1)
ABS. EOSINOPHILS: 0.4 10*3/uL — ABNORMAL HIGH (ref 0.0–0.2)
ABS. LYMPHOCYTES: 5.3 10*3/uL (ref 1.0–5.5)
ABS. MONOCYTES: 0.7 10*3/uL (ref 0.1–1.0)
ABS. NEUTROPHILS: 4 10*3/uL (ref 2.0–8.1)
BASOPHILS: 1 % (ref 0.0–1.0)
EOSINOPHILS: 4 % — ABNORMAL HIGH (ref 0.0–2.0)
HCT: 48.7 % — ABNORMAL HIGH (ref 37.0–47.0)
HGB: 16 g/dL (ref 12.0–16.0)
LYMPHOCYTES: 50 % (ref 20.5–51.1)
MCH: 28.8 PG (ref 27.0–31.0)
MCHC: 32.9 g/dL (ref 30.5–36.0)
MCV: 87.6 FL (ref 81.0–100.0)
MONOCYTES: 7 % (ref 1.7–10.0)
MPV: 11.2 FL (ref 10.2–12.7)
NEUTROPHILS: 38 % — ABNORMAL LOW (ref 42.2–75.2)
PLATELET: 209 10*3/uL (ref 122–400)
RBC: 5.56 M/uL — ABNORMAL HIGH (ref 4.20–5.40)
RDW: 15 % — ABNORMAL HIGH (ref 11.4–14.6)
WBC: 10.5 10*3/uL (ref 4.8–10.8)

## 2013-10-21 MED ADMIN — heparin (porcine) injection 5,000 Units: SUBCUTANEOUS | NDC 25021040201

## 2013-10-21 MED ADMIN — heparin (porcine) injection 5,000 Units: SUBCUTANEOUS | @ 04:00:00 | NDC 25021040201

## 2013-10-21 MED ADMIN — docusate sodium (COLACE) capsule 100 mg: ORAL | @ 13:00:00 | NDC 63739047810

## 2013-10-21 MED ADMIN — heparin (porcine) injection 5,000 Units: SUBCUTANEOUS | @ 14:00:00 | NDC 25021040201

## 2013-10-21 MED ADMIN — docusate sodium (COLACE) capsule 100 mg: ORAL | @ 22:00:00 | NDC 63739047810

## 2013-10-21 MED FILL — METOPROLOL TARTRATE 100 MG TAB: 100 mg | ORAL | Qty: 1

## 2013-10-21 MED FILL — HEPARIN (PORCINE) 5,000 UNIT/ML IJ SOLN: 5000 unit/mL | INTRAMUSCULAR | Qty: 1

## 2013-10-21 MED FILL — DOCUSATE SODIUM 100 MG CAP: 100 mg | ORAL | Qty: 1

## 2013-10-21 MED FILL — FUROSEMIDE 40 MG TAB: 40 mg | ORAL | Qty: 1

## 2013-10-21 MED FILL — SIMVASTATIN 10 MG TAB: 10 mg | ORAL | Qty: 1

## 2013-10-21 MED FILL — ASPIRIN 81 MG TAB, DELAYED RELEASE: 81 mg | ORAL | Qty: 1

## 2013-10-21 NOTE — Progress Notes (Signed)
Pt moved to room 131-1. Pt stated that she will inform her husband.

## 2013-10-21 NOTE — Other (Signed)
Bedside, Verbal and Written shift change report given to Liz Malocsay (oncoming nurse) by Tiffany Oszmanski (offgoing nurse). Report included the following information SBAR, Kardex and MAR.

## 2013-10-21 NOTE — Progress Notes (Signed)
MD does not feel patient can go to subacute yet, awaiting a neuro consult.  I spoke with patient and she is aware, [facility].  Will follow for medical readiness.  Earney NavyMichele Gregorio RN ACM

## 2013-10-21 NOTE — Consults (Addendum)
Consult Note    Patient: Sheri Fry MRN: [id number]  CSN: [id number]    Date of Birth: 05-16-1951  Age: 63 y.o.  Sex: female    DOA: 10/18/2013 LOS:  LOS: 3 days        Requesting Physician: Mir, MD  Reason for Consultation: LE weakness              HPI:     Melitza Fry is a 63 y.o. female who I have been asked to see for lower extremity weakness.  Per patient approx 3 weeks ago her RLE gave out as she was pivoting in the bathroom and she fell and was unable to get up.  She states she was on the ground for 20 minutes and EMS had to come and help her.  She was seen in the hospital and discharged with PT.  Per husband she has continued to deteriorate.  She is unable to ambulate.  She denies any bladder incontinence.  She does admit to some constipation.       Past Medical History   Diagnosis Date   ??? Hypertension    ??? Obesity    ??? High cholesterol    ??? Hypokalemia    ??? Vitamin D deficiency    ??? Sleep apnea    ??? Ill-defined condition        Past Surgical History   Procedure Laterality Date   ??? Hx hip fracture tx Right 2006   ??? Hx femur fracture tx Bilateral 2006       History reviewed. No pertinent family history.    History     Social History   ??? Marital Status: MARRIED     Spouse Name: N/A     Number of Children: N/A   ??? Years of Education: N/A     Social History Main Topics   ??? Smoking status: Never Smoker    ??? Smokeless tobacco: Not on file   ??? Alcohol Use: Yes      Comment: Rare   ??? Drug Use: No   ??? Sexually Active: Not on file     Other Topics Concern   ??? Not on file     Social History Narrative   ??? No narrative on file       Current Facility-Administered Medications   Medication Dose Route Frequency   ??? bisacodyl (DULCOLAX) suppository 10 mg  10 mg Rectal ONCE   ??? docusate sodium (COLACE) capsule 100 mg  100 mg Oral BID   ??? heparin (porcine) injection 5,000 Units  5,000 Units SubCUTAneous Q8H   ??? aspirin delayed-release tablet 81 mg  81 mg Oral DAILY   ??? metoprolol (LOPRESSOR) tablet 100 mg  100 mg Oral DAILY    ??? simvastatin (ZOCOR) tablet 10 mg  10 mg Oral QHS       Prior to Admission medications    Medication Sig Start Date End Date Taking? Authorizing Provider   cholecalciferol, vitamin D3, (VITAMIN D3) 2,000 unit tab Take 2,000 Units by mouth daily.   Yes Historical Provider   aspirin delayed-release 81 mg tablet Take 81 mg by mouth daily.   Yes Phys Other, MD   furosemide (LASIX) 40 mg tablet Take 40 mg by mouth daily.   Yes Phys Other, MD   metoprolol (LOPRESSOR) 100 mg tablet Take 100 mg by mouth daily.   Yes Phys Other, MD   simvastatin (ZOCOR) 10 mg tablet Take 10 mg by mouth nightly.   Yes Phys Other,  MD   potassium chloride SR (KLOR-CON 10) 10 mEq tablet Take 20 mEq by mouth daily.   Yes Phys Other, MD       Allergies   Allergen Reactions   ??? Pcn [Penicillins] Unknown (comments)     "I don't know what happens, I was told never to take it" states patient.       Review of Systems  GENERAL: No fevers or chills.  HEENT: No change in vision, no earache, tinnitus, sore throat or sinus congestion.   NECK: No pain or stiffness.   CARDIOVASCULAR: No chest pain or pressure. No palpitations.   PULMONARY: No shortness of breath, cough or wheeze.   GASTROINTESTINAL: + constipation  GENITOURINARY: No urinary frequency, urgency, hesitancy or dysuria. MUSCULOSKELETAL: No joint or muscle pain, no back pain, no recent trauma.           Physical Exam:      BP 157/77   Pulse 68   Temp(Src) 98.4 ??F (36.9 ??C)   Resp 20   Ht 5\' 2"  (1.575 m)   Wt 121.564 kg (268 lb)   BMI 49.01 kg/m2   SpO2 96%   Breastfeeding? No O2 Flow Rate (L/min): 3 l/min O2 Device: CPAP nasal    Temp (24hrs), Avg:98.3 ??F (36.8 ??C), Min:98.2 ??F (36.8 ??C), Max:98.4 ??F (36.9 ??C)    01/22 0700 - 01/22 1859  In: 860 [P.O.:860]  Out: -    01/20 1900 - 01/22 0659  In: 880 [P.O.:880]  Out: -     General:  Alert, cooperative, no distress, appears stated age. Obese   Head:  Normocephalic, without obvious abnormality, atraumatic.   Lungs:   Decreased breath sounds b/l    Extremities: Extremities normal, atraumatic, no cyanosis or edema.         Neurologic:    Mental Status: AAO x 3   Cranial Nerves: PERRL, EOMI, face symmetric, sensation intact, tongue midline   Motor Function: B/l upper extremities 5/5, LLE 1/5, RLE 2/5   Sensory: Intact sensation to LT and PP, no sensory level appreciated   Coordination: intact finger to nose b/l   Gait: patient unable to ambulate   Reflexes: Upper extremities 1, left knee 2, right knee unable to obtain due to edema.  B/l ankles 2, b/l toes up going, + clonus b/l   Normal rectal tone        Ancillary Studies:       Bloodwork:  Recent Results (from the past 24 hour(s))   CBC WITH AUTOMATED DIFF    Collection Time     10/21/13 12:30 PM       Result Value Range    WBC 10.5  4.8 - 10.8 K/uL    RBC 5.56 (*) 4.20 - 5.40 M/uL    HGB 16.0  12.0 - 16.0 g/dL    HCT 45.448.7 (*) 09.837.0 - 47.0 %    MCV 87.6  81.0 - 100.0 FL    MCH 28.8  27.0 - 31.0 PG    MCHC 32.9  30.5 - 36.0 g/dL    RDW 11.915.0 (*) 14.711.4 - 14.6 %    PLATELET 209  122 - 400 K/uL    MPV 11.2  10.2 - 12.7 FL    NEUTROPHILS 38 (*) 42.2 - 75.2 %    LYMPHOCYTES 50  20.5 - 51.1 %    MONOCYTES 7  1.7 - 10.0 %    EOSINOPHILS 4 (*) 0.0 - 2.0 %    BASOPHILS 1  0.0 - 1.0 %    ABS. NEUTROPHILS 4.0  2.0 - 8.1 K/UL    ABS. LYMPHOCYTES 5.3  1.0 - 5.5 K/UL    ABS. MONOCYTES 0.7  0.1 - 1.0 K/UL    ABS. EOSINOPHILS 0.4 (*) 0.0 - 0.2 K/UL    ABS. BASOPHILS 0.1  0.0 - 0.1 K/UL    DF AUTOMATED     METABOLIC PANEL, BASIC    Collection Time     10/21/13 12:30 PM       Result Value Range    Sodium 141  136 - 145 mmol/L    Potassium 3.5  3.5 - 5.1 mmol/L    Chloride 103  98 - 107 mmol/L    CO2 29  21 - 32 mmol/L    Anion gap 9  8 - 20 mmol/L    Glucose 112 (*) 74 - 106 mg/dL    BUN 23 (*) 7 - 18 mg/dL    Creatinine 1.0  0.6 - 1.3 mg/dL    GFR est AA >16  >10 ml/min/1.44m2    GFR est non-AA >60  >60 ml/min/1.56m2    Calcium 11.3 (*) 8.5 - 10.1 mg/dL       Radiology:  Xr Hand Lt Min 3 V    10/03/2013   Radiographs of the LEFT  hand    performed on 10/03/2013 11:37 AM    for patient      female of 62 years  CLINICAL INFORMATION:  Trauma   TECHNIQUE:  Frontal, oblique and lateral views of the hand were obtained.  FINDINGS:  No prior examinations are available for review.  The metacarpals and phalanges of the hand are intact, without fracture or  malalignment.  Joint spaces appear intact.    No soft tissue abnormality is seen.  No radiopaque foreign body is seen.   Probable remote avulsion fracture of the ulnar styloid but clinical correlation  is recommended.      10/03/2013   IMPRESSION:   Unremarkable radiographs of the LEFT hand.  Other findings as  described.   THIS DOCUMENT HAS BEEN ELECTRONICALLY SIGNED BY Viviann Spare LEFFLER MD    Xr Hip Lt Ap/lat Min 2 V    10/03/2013   Radiographs of the LEFT hip    performed on 10/03/2013 11:37 AM    for patient      female of 62 years  CLINICAL INFORMATION:  Pain   TECHNIQUE:  Frontal and extension views of the hip were obtained.  FINDINGS:  No prior exams are available for comparison.  No fracture is seen.  Intramedullary rod in the femur is noted.  Fracture  deformity mid femoral shaft.  The hip remains located.  No loose body is  identified.  The hip joint space is preserved.  No significant productive  changes or other cortical or trabecular abnormalities are recognized.  The  femoral head is intact without cortical collapse or radiographic evidence of  osteonecrosis.  No periarticular soft tissue abnormality is recognized.  No radiopaque foreign  body is found.      10/03/2013   IMPRESSION: No fracture demonstrated.  THIS DOCUMENT HAS BEEN ELECTRONICALLY SIGNED BY STEVEN LEFFLER MD    Xr Hip Rt Ap/lat Min 2 V    10/03/2013   Radiographs of the RIGHT hip    performed on 10/03/2013 11:37 AM    for patient      female of 62 years  CLINICAL INFORMATION:  Pain   TECHNIQUE:  Frontal and  extension views of the hip were obtained.  FINDINGS:  No prior exams are available for comparison.  Surgical hardware is present.   The gamma nail is lateral to the femoral head.   There are hypertrophic bony changes involving the trochanteric region.  In the  absence of prior studies, the findings may be acute or chronic.  One or more of  the buttress plate screws may be dislodged.  The hip remains located.  No loose  body is identified.  The hip joint space is preserved.   The femoral head is  intact without cortical collapse or radiographic evidence of osteonecrosis.  No periarticular soft tissue abnormality is recognized.  No radiopaque foreign  body is found.      10/03/2013   IMPRESSION: Postsurgical changes are present.  Surgical hardware stability  difficult to assess in the absence of prior studies and dislodgment or  underlying fracture cannot be excluded.  THIS DOCUMENT HAS BEEN ELECTRONICALLY SIGNED BY Viviann Spare LEFFLER MD    Xr Foot Lt Min 3 V    10/03/2013   Radiographs of the LEFT foot     performed on 10/03/2013 11:37 AM    for patient      female of 62 years  CLINICAL INFORMATION:  Pain   TECHNIQUE:  Frontal, oblique and lateral views of the foot were obtained.  FINDINGS:  No prior similar studies are available for review  The forefoot demonstated intact osseous structures without fracture or bone  lesion.  Alignment is maintained.  The sesamoids appear intact.  No soft tissue  abnormality is found.  Hammertoe deformities.  Mild deformity of the first MTP  joint.  The midfoot appears intact.  The hindfoot appears intact.  Posterior and plantar calcaneal spurs.  The soft  tissues appear intact.      10/03/2013   IMPRESSION: No fracture demonstrated.  THIS DOCUMENT HAS BEEN ELECTRONICALLY SIGNED BY STEVEN LEFFLER MD    Ct Spine Lumb Wo Cont    10/20/2013   CT lumbar spine without IV contrast    performed on 10/20/2013 2:17 PM    for patient      female of 62 years  CLINICAL INFORMATION:  L leg weakness   TECHNIQUE:  Continguous axial 2.58mm sections were obtained through the lumbar  spine using a single helical acquisition.  Sagittal , coronal  and 3-D color  volume rendered reformatted images were obtained.  This scan was performed using  automatic exposure control (radiation dose reduction software) to obtain a  diagnostic image quality scan with patient dose as low as reasonably achievable.  Estimated dose for this examination in total is DLP centered at mGY-cm.  FINDINGS:  Prior study: None  Levoscoliosis with apex at L3.  Mild anterior wedge deformity of L1.  Scrotal No  destructive bone lesion is found.  Facet joints appear aligned.  The visualized  sacral and pelvic bones appear intact.  Alignment is preserved.    Disc space narrowing at L5-S1.  There is multilevel vertebral spurring.  No  high-grade central canal compromise is recognized by the CT technique.  Neural  foramina appear intact.  MR would be required to evaluate the intervertebral  discs at higher sensitivity for disc pathology.    No paraspinal mass is recognized.  There is hypoattenuation of the RIGHT psoas  muscle attributed to atrophy.  Paraspinal soft tissues appear intact.  Calcified  aorta and iliac arteries.      10/20/2013   IMPRESSION:  Lumbar  spondylosis. Mild anterior wedge deformity of L1. RIGHT psoas muscle atrophy.  No discrete lesion of the source is identified but  followup is recommended if clinically warranted.    THIS DOCUMENT HAS BEEN ELECTRONICALLY SIGNED BY Viviann Spare LEFFLER MD    Xr Knee Lt 3 V    10/03/2013   Radiographs of the LEFT knee    performed on 10/03/2013 11:37 AM    for patient      female of 62 years  CLINICAL INFORMATION:  Pain   TECHNIQUE:  Frontal, lateral and oblique views of the knee were obtained.  FINDINGS:  No prior exams are available for comparison  Intramedullary rod in the femur.  No fracture is seen.  No joint effusion is  found.  No loose body is identified.  Medial and patellofemoral compartments are  narrowed.  Patellar spurring is.    There are dystrophic calcifications in the soft tissues below the knee..  No  radiopaque foreign body is  seen.      10/03/2013   IMPRESSION: No fracture demonstrated.   THIS DOCUMENT HAS BEEN ELECTRONICALLY SIGNED BY STEVEN LEFFLER MD    Xr Knee Rt 3 V    10/03/2013   Radiographs of the RIGHT knee    performed on 10/03/2013 11:37 AM    for patient      female of 62 years  CLINICAL INFORMATION:  Pain   TECHNIQUE:  Frontal, lateral and oblique views of the knee were obtained.  FINDINGS:  No prior exams are available for comparison  No fracture is seen.  No joint effusion is found.  No loose body is identified.   Narrowing of the medial and patellofemoral compartments.  Hypertrophic spurring  of the patella.    Dystrophic calcifications in the soft tissues below the knee.  No radiopaque  foreign body is seen.      10/03/2013   IMPRESSION: No fracture demonstrated.   THIS DOCUMENT HAS BEEN ELECTRONICALLY SIGNED BY STEVEN LEFFLER MD    Duplex Lower Ext Venous Right    09/27/2013   HISTORY:  Right lower extremity swelling and pain.  TECHNIQUE:  Multiple static images are obtained during a real time evaluation of  the major veins of the right lower extremity. Compressibility and degree of  augmentation is assessed. Color Doppler is utilized.  FULL RESULT:  The major veins of the right lower extremity are imaged from the  popliteal fossa to the groin, with imaging of the distal tibial veins antegrade  to the level of the common femoral vein.  There is normal compressibility of these structures and normal augmented  response, based on the appearance of the venous wave forms.     09/27/2013   IMPRESSION: Patency of the major veins of the right lower extremity. No evidence of deep venous thrombosis.      THIS DOCUMENT HAS BEEN ELECTRONICALLY SIGNED BY Mindi Junker BITTER MD        Impression and Plan:   Ms. Alamo is a 63 yo female seen in consultation for b/l LE weakness left worse than right x 1 month. CT L spine reviewed.   Now with UMN signs on exam.  Rec MRI L and T spine.  Case D/w Dr. Bryn Gulling.      Malka So, MD  10/21/2013  6:47 PM

## 2013-10-21 NOTE — Other (Signed)
Bedside and Verbal shift change report given to Jessica Janisheski RN (oncoming nurse) by Julisa Flippo MALOCSAY, RN   (offgoing nurse). Report included the following information SBAR, Kardex, Intake/Output, MAR and Recent Results.

## 2013-10-21 NOTE — Progress Notes (Signed)
[id number].  It is for 4 days.  Patient and her husband aware and will accept bed today.  Her husband will bring patient BIPAP in.  MD aware, discahrge plan will be [facility].  Earney NavyMichele Gregorio RN ACM

## 2013-10-21 NOTE — Progress Notes (Signed)
Hospitalist Progress Note    NAME:  Sheri Fry   DOB:   Mar 30, 1951   MRN:   [id number]     Date/Time:  10/21/2013  Subjective:   Chief Complaint:    Still not able to move L leg         Review of Systems:    General: No fevers or chills.  Cardiovascular: No chest pain or pressure. No palpitations.   Pulmonary: No shortness of breath.   Gastrointestinal: No nausea, vomiting    Current Facility-Administered Medications   Medication Dose Route Frequency   ??? docusate sodium (COLACE) capsule 100 mg  100 mg Oral BID   ??? heparin (porcine) injection 5,000 Units  5,000 Units SubCUTAneous Q8H   ??? aspirin delayed-release tablet 81 mg  81 mg Oral DAILY   ??? furosemide (LASIX) tablet 40 mg  40 mg Oral DAILY   ??? metoprolol (LOPRESSOR) tablet 100 mg  100 mg Oral DAILY   ??? simvastatin (ZOCOR) tablet 10 mg  10 mg Oral QHS         Objective:   Vitals:  Visit Vitals   Item Reading   ??? BP 157/77   ??? Pulse 68   ??? Temp 98.4 ??F (36.9 ??C)   ??? Resp 20   ??? Ht 5\' 2"  (1.575 m)   ??? Wt 121.564 kg (268 lb)   ??? BMI 49.01 kg/m2   ??? SpO2 96%   ??? Breastfeeding No      Temp (24hrs), Avg:98.3 ??F (36.8 ??C), Min:98.2 ??F (36.8 ??C), Max:98.4 ??F (36.9 ??C)        PHYSICAL EXAM:  General  Alert, in no acute distress      Lungs:  Wheezing [] YES [x] NO  ,Rales [] YES  [x] No , Raspatory distress[] YES   [x] NO    Heart: Regular  Rhythm S1 , S 2     Abdomen: soft [x] YES   [] NO  ,   Bowl sounds [x] YES   [] NO      Tennder[] YES   [x] NO    Extremities: Edema [] YES   [x] NO,  Local swelling  [] YES   [] NO    Neurologic:  Alert and oriented X 3.  No acute neurological findings             Labs: Results:       Chemistry Recent Labs      10/18/13   1800   GLU  132*   NA  140   K  4.3   CL  103   CO2  26   BUN  19*   CREA  1.1   CA  11.6*   AGAP  11   AP  121   TP  8.1   ALB  4.2   GLOB  3.9   AGRAT  1.1      CBC w/Diff Recent Labs      10/18/13   1800   WBC  12.9*   RBC  5.66*   HGB  16.1*   HCT  48.8*   PLT  202   GRANS  47   LYMPH  39   EOS  2      Cardiac Enzymes No results  found for this basename: CPK, CKRMB, CKND1, TROIP, MYO,  in the last 72 hours   Coagulation No results found for this basename: PTP, INR, APTT,  in the last 72 hours          Assessment/Plan:     Assessment :  [age] F with h/o Morbid obesity , admitted with Inability to walk . Not able to Move her Legs . L > r   Has HTN  And constipation .   Pt was able to walk a month ago . For last 3 weeks she is in the bed . She had a minor fall but no fractures   Her L leg is almost flaccid .   Sensations are intact . Im not able to see any motor response in  L leg   CT LS spine was negative .   There is concern about LMN disease like  GBS   I discussed with Neurology and she will  Be seen today   She needs w/u of above and will be converted to inpatient status .   DVT prophyalxis   Dc Lasix as I dont see any pitting edema   Cont Colace       Plan :            Active Problems:    * No active hospital problems. *           ___________________________________________________    Prophylaxis:  [] Lovenox  [] Coumadin  [] Hep SQ  [] SCD???s  [] H2B/PPI    Care Plan discussed with:    [] Patient   [] Family    [] Care Manager    [] Nursing   [] Consultant/Specialist :      Disposition:  [] Home w/ Family   [] HH PT,OT,RN   [] SNF/LTC   [] SAH/Rehab      Wynonia HazardKhawaja  Caterine Mcmeans, MD  October 21, 2013

## 2013-10-22 MED ADMIN — heparin (porcine) injection 5,000 Units: SUBCUTANEOUS | @ 14:00:00 | NDC 25021040201

## 2013-10-22 MED ADMIN — heparin (porcine) injection 5,000 Units: SUBCUTANEOUS | @ 05:00:00 | NDC 25021040201

## 2013-10-22 MED ADMIN — bisacodyl (DULCOLAX) suppository 10 mg: RECTAL | @ 02:00:00 | NDC 00574705012

## 2013-10-22 MED ADMIN — ioversol (OPTIRAY) 320 mg iodine/mL contrast injection 50-100 mL: INTRAVENOUS | @ 18:00:00 | NDC 00019132311

## 2013-10-22 MED ADMIN — docusate sodium (COLACE) capsule 100 mg: ORAL | @ 13:00:00 | NDC 63739047810

## 2013-10-22 MED FILL — HEPARIN (PORCINE) 5,000 UNIT/ML IJ SOLN: 5000 unit/mL | INTRAMUSCULAR | Qty: 1

## 2013-10-22 MED FILL — SIMVASTATIN 10 MG TAB: 10 mg | ORAL | Qty: 1

## 2013-10-22 MED FILL — ASPIRIN 81 MG TAB, DELAYED RELEASE: 81 mg | ORAL | Qty: 1

## 2013-10-22 MED FILL — DOCUSATE SODIUM 100 MG CAP: 100 mg | ORAL | Qty: 1

## 2013-10-22 MED FILL — METOPROLOL TARTRATE 100 MG TAB: 100 mg | ORAL | Qty: 1

## 2013-10-22 MED FILL — BISACODYL 10 MG RECTAL SUPPOSITORY: 10 mg | RECTAL | Qty: 1

## 2013-10-22 NOTE — Progress Notes (Signed)
Patient transported via [facility] with own CPAP and dc instructions and scripts. Husband escorted to car via wheelchair.
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
## 2013-10-22 NOTE — Other (Signed)
Bedside and Verbal shift change report given to Elizabeth Malocsay RN (oncoming nurse) by Kindle Strohmeier, RN   (offgoing nurse). Report included the following information SBAR, Kardex, MAR and Recent Results.

## 2013-10-22 NOTE — Progress Notes (Signed)
physical Therapy TREATMENT  Patient: Sheri FilbertRosita Boehning [age] y.o. female)  Date: 10/22/2013  Diagnosis: debility  Leg weakness, bilateral <principal problem not specified>       Precautions: Fall    ASSESSMENT:  Pt. In bed, willing to participate with P.T. Tx. Pt. In good spirits.  Progression toward goals:  []     Improving appropriately and progressing toward goals  [x]     Improving slowly and progressing toward goals  []     Not making progress toward goals and plan of care will be adjusted     PLAN:  Patient continues to benefit from skilled intervention to address the above impairments.  Continue treatment per established plan of care.  Discharge Recommendations:  Skilled Nursing Facility  Further Equipment Recommendations for Discharge:  hospital bed, mechanical lift and wheelchair.     SUBJECTIVE:   Patient stated ???It feels good to exercise.Marland Kitchen.???    OBJECTIVE DATA SUMMARY:   Critical Behavior:  Neurologic State: Alert  Orientation Level: Oriented X4  Cognition: Follows commands  Safety/Judgement: Awareness of environment  Functional Mobility Training:  Bed Mobility:                    Transfers:                                   Balance:     Ambulation/Gait Training:                                                        Stairs:            Neuro Re-Education:    Therapeutic Exercises:   Pt. Performed manual AR ex to UE's, quad/glut sets, p-aa ROM ex to LE's, 10x each bedside.  Pain:  Pain Scale 1: Numeric (0 - 10)  Pain Intensity 1: 0              Activity Tolerance:   Pt. tol tx well.  Please refer to the flowsheet for vital signs taken during this treatment.  After treatment:   []     Patient left in no apparent distress sitting up in chair  [x]     Patient left in no apparent distress in bed  [x]     Call bell left within reach  [x]     Nursing notified  []     Caregiver present  []     Bed alarm activated    COMMUNICATION/COLLABORATION:   The patient???s plan of care was discussed with: Registered Nurse    Maryla MorrowFOTIS, ELLEN  [phone]   Time Calculation: 15 mins

## 2013-10-22 NOTE — Progress Notes (Signed)
Full bed bath given head to toe. Patient turned and repositioned well. Encouraged to do exercises in bed. Encouraged to keep trying to move left leg. Linen changed. Husband at bedside.

## 2013-10-22 NOTE — Progress Notes (Signed)
Per MD, [facility]  [facility].  arrangments made for ambulance transport.  Patient and her husband aware.  Discharge plan is transfer to sub acute rehab.  Earney NavyMichele Gregorio RN ACM
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
## 2013-10-22 NOTE — Progress Notes (Signed)
[facility] [place] [facility] [phone]. Patient and husband aware of results and of transfer. All belongings packed and at the bedside awaiting transfer out via ambulance at 1630

## 2013-10-22 NOTE — Progress Notes (Signed)
Patient had one medium sized soft BM after suppository. No complaints of pain. Can move L lower leg to turn on side. Complains of decreased ability to wiggle toes, but is still able. Sensation present, pedal pulse present. Will continue to monitor.

## 2013-11-18 LAB — METABOLIC PANEL, COMPREHENSIVE
A-G Ratio: 1 (ref 1.0–1.5)
ALT (SGPT): 31 U/L (ref 12–78)
AST (SGOT): 17 U/L (ref 15–37)
Albumin: 3.8 g/dL (ref 3.4–5.0)
Alk. phosphatase: 82 U/L (ref 50–136)
Anion gap: 6 mmol/L — ABNORMAL LOW (ref 8–20)
BUN: 17 mg/dL (ref 7–18)
Bilirubin, total: 1.2 mg/dL — ABNORMAL HIGH (ref 0.2–1.0)
CO2: 30 mmol/L (ref 21–32)
Calcium: 11 mg/dL — ABNORMAL HIGH (ref 8.5–10.1)
Chloride: 101 mmol/L (ref 98–107)
Creatinine: 0.8 mg/dL (ref 0.6–1.3)
GFR est AA: >60 mL/min/{1.73_m2} (ref >60)
GFR est non-AA: >60 mL/min/{1.73_m2} (ref >60)
Globulin: 3.7 g/dL (ref 2.5–5.0)
Glucose: 102 mg/dL (ref 74–106)
Potassium: 3.9 mmol/L (ref 3.5–5.1)
Protein, total: 7.5 g/dL (ref 6.4–8.2)
Sodium: 137 mmol/L (ref 136–145)

## 2013-11-18 LAB — PROTEIN, CONFIRM

## 2013-11-18 LAB — URINALYSIS W/ RFLX MICROSCOPIC
Bilirubin: NEGATIVE
Glucose: NEGATIVE mg/dL
Ketone: NEGATIVE mg/dL
Nitrites: POSITIVE — AB
Protein: 100 mg/dL — AB
Specific gravity: 1.015 (ref 1.005–1.030)
Urobilinogen: 1 EU/dL (ref 0.1–1.0)
pH (UA): 7.5 (ref 4.5–8.0)

## 2013-11-18 LAB — CBC WITH AUTOMATED DIFF
ABS. BASOPHILS: 0.1 10*3/uL
ABS. EOSINOPHILS: 0.4 10*3/uL
ABS. LYMPHOCYTES: 5.5 10*3/uL
ABS. MONOCYTES: 1 10*3/uL
ABS. NEUTROPHILS: 5.7 10*3/uL
ATYPICAL LYMPHS: 14 %
BASOPHILS: 1 % (ref 0–1)
EOSINOPHILS: 3 % — ABNORMAL HIGH (ref 0–2)
HCT: 44.8 % (ref 37.0–47.0)
HGB: 15.2 g/dL (ref 12.0–16.0)
LYMPHOCYTES: 29 % (ref 21–51)
MCH: 29.5 PG (ref 27.0–31.0)
MCHC: 33.9 g/dL (ref 30.5–36.0)
MCV: 86.8 FL (ref 81.0–100.0)
MONOCYTES: 8 % (ref 2–9)
MPV: 11.8 FL (ref 10.2–12.7)
NEUTROPHILS: 45 % (ref 42–75)
PLATELET ESTIMATE: ADEQUATE
PLATELET: 161 10*3/uL (ref 122–400)
RBC: 5.16 M/uL (ref 4.20–5.40)
RDW: 14.4 % (ref 11.4–14.6)
WBC: 12.7 10*3/uL — ABNORMAL HIGH (ref 4.8–10.8)

## 2013-11-18 LAB — URINE MICROSCOPIC

## 2013-11-18 MED ORDER — NITROFURANTOIN (25% MACROCRYSTAL FORM) 100 MG CAP
100 mg | ORAL | Status: AC
Start: 2013-11-18 — End: 2013-11-18
  Administered 2013-11-18: 22:00:00 via ORAL

## 2013-11-18 MED ORDER — NITROFURANTOIN (25% MACROCRYSTAL FORM) 100 MG CAP
100 mg | ORAL_CAPSULE | Freq: Two times a day (BID) | ORAL | Status: DC
Start: 2013-11-18 — End: 2013-11-18

## 2013-11-18 MED ORDER — NITROFURANTOIN (25% MACROCRYSTAL FORM) 100 MG CAP
100 mg | ORAL_CAPSULE | Freq: Two times a day (BID) | ORAL | Status: AC
Start: 2013-11-18 — End: 2013-11-25

## 2013-11-18 MED ORDER — IOVERSOL 320 MG/ML IV SOLN
320 mg iodine/mL | Freq: Once | INTRAVENOUS | Status: AC
Start: 2013-11-18 — End: 2013-11-18
  Administered 2013-11-18: 20:00:00 via INTRAVENOUS

## 2013-11-18 MED ADMIN — 0.9% sodium chloride infusion: INTRAVENOUS | @ 18:00:00 | NDC 00409798309

## 2013-11-18 MED ADMIN — 0.9% sodium chloride infusion: INTRAVENOUS | @ 19:00:00 | NDC 87701099893

## 2013-11-18 MED FILL — NITROFURANTOIN (25% MACROCRYSTAL FORM) 100 MG CAP: 100 mg | ORAL | Qty: 1

## 2013-11-18 MED FILL — SODIUM CHLORIDE 0.9 % IV: INTRAVENOUS | Qty: 1000

## 2013-11-18 NOTE — ED Notes (Signed)
Report given to MudloggerCheryl supervisor at [facility]. Patient stable at this time, follow-up care reviewed as well as medications.

## 2013-11-18 NOTE — ED Notes (Signed)
Patient bib ems from [facility]. Patient reports she has had numbness and weakness to bilateral extremities for 5-6 weeks with limited movement. Patient reports over the last 4 or 5 days she has had no movement in left leg at all which she had some mobility in the past. Patient also reports urinary retention and no BM in 5 days. Patient had foley cath placed 3 days ago and now has bloody foul smelling urine at this time. Patient pmd wants neuro consulted to r/o spinal decompression. Patient can not have MRI because of rods in her legs.

## 2013-11-18 NOTE — ED Provider Notes (Signed)
HPI Comments: PT SENT FROM [facility] FOR FURTHER EVAL BILATERAL LEG WEAKNESS. PT REPORTEDLY HASN'T BEEN ABLE TO STAND OR WALK IN AT LEAST 6 WEEKS. ORIGINALLY SEEN FOR DJD IN KNEES/ LEGS AND FALL ADMITTED 10/18/2013, THEN DISCHARGED TO REHAB. SEEN BY NEUROLOGY TODAY AND SENT TO THE ED FOR FURTHER EVAL. NO CP, ABD PAIN, SOB, N/V/D, REPORTEDLY UNABLE TO VOID HAD FOLEY PLACED ABOUT 2 DAYS AGO W 1800CC PVR, NO BM SINCE MONDAY    Patient is a 63 y.o. female presenting with weakness. The history is provided by the patient.   Extremity Weakness  The current episode started more than 1 week ago. The problem has not changed since onset.There was no focality noted. Pertinent negatives include no focal weakness, no loss of sensation, no loss of balance, no slurred speech, no speech difficulty, no memory loss, no movement disorder, no agitation, no visual change, no auditory change, no mental status change, no unresponsiveness and no disorientation. There has been no fever. Pertinent negatives include no shortness of breath, no chest pain, no vomiting and no nausea.        Past Medical History   Diagnosis Date   ??? Hypertension    ??? Obesity    ??? High cholesterol    ??? Hypokalemia    ??? Vitamin D deficiency    ??? Sleep apnea    ??? Ill-defined condition         Past Surgical History   Procedure Laterality Date   ??? Hx hip fracture tx Right 2006   ??? Hx femur fracture tx Bilateral 2006         History reviewed. No pertinent family history.     History     Social History   ??? Marital Status: MARRIED     Spouse Name: N/A     Number of Children: N/A   ??? Years of Education: N/A     Occupational History   ??? Not on file.     Social History Main Topics   ??? Smoking status: Never Smoker    ??? Smokeless tobacco: Not on file   ??? Alcohol Use: Yes      Comment: Rare   ??? Drug Use: No   ??? Sexual Activity: Not on file     Other Topics Concern   ??? Not on file     Social History Narrative                  ALLERGIES: Levaquin and Pcn      Review of Systems    Constitutional: Negative for fever.   HENT: Negative.    Eyes: Negative.    Respiratory: Negative.  Negative for cough and shortness of breath.    Cardiovascular: Negative.  Negative for chest pain.   Gastrointestinal: Positive for constipation. Negative for nausea, vomiting, abdominal pain and rectal pain.   Genitourinary: Negative.    Musculoskeletal: Positive for gait problem.   Skin: Negative.  Negative for rash.   Neurological: Positive for weakness. Negative for focal weakness, speech difficulty, numbness and loss of balance.   Psychiatric/Behavioral: Negative.  Negative for memory loss and agitation.       Filed Vitals:    11/18/13 1209 11/18/13 1215   BP:  155/67   Pulse: 65    Temp: 98.1 ??F (36.7 ??C)    Resp: 18    Height: 5\' 2"  (1.575 m)    Weight: 117.935 kg (260 lb)    SpO2: 97%  Physical Exam   Constitutional: She is oriented to person, place, and time. She appears well-developed and well-nourished.   HENT:   Head: Normocephalic and atraumatic.   Eyes: Conjunctivae are normal. Pupils are equal, round, and reactive to light.   Neck: Normal range of motion. Neck supple.   Cardiovascular: Normal rate, regular rhythm and normal heart sounds.    Pulmonary/Chest: Effort normal and breath sounds normal.   Abdominal: Soft. Bowel sounds are normal. There is no tenderness.   MORBIDLY OBESE, SOFT, NON TENDER    Genitourinary: Rectal exam shows no internal hemorrhoid, no fissure, no mass, no tenderness and anal tone normal.   RECTAL FECAL IMPACTION, MANUAL DISIMPACTION. SPHINTER TONE SL DECREASED BUT PRESENT AS IS PERINEAL SENSATION   Musculoskeletal:   UPPER EXTREMITY ROM, STRENGTH NORMAL, LOWER EXTREMITIES APPEAR NORMAL   Neurological: She is alert and oriented to person, place, and time. No cranial nerve deficit.   UNABLE TO MOVE BILAT LE, POS BABINSKI, SUSTAINED CLONUS   Skin: Skin is warm and dry.   Nursing note and vitals reviewed.       MDM    Procedures    CASE DISCUSSED AT LENGTH WITH DR Grier RocherJAEGER  NEURO WHO SAW PT TODAY. WILL GET CT C SPINE W/WO CONTRAST PT HAS BILAT FEMUR RODS, PRECLUDES MRI, RECENT T/L SPINE CT DO NOT SUGGEST PATHOLOGY TO CAUSE DEFICIT    Ct Spine Cerv W Wo Cont    11/18/2013   CT cervical spine without and with IV contrast    performed on 11/18/2013 2:34 PM    for patient      female of 62 years  CLINICAL INFORMATION Cant move legs,  t/l spine ct neg   TECHNIQUE:  Contiguous axial 2.555mm sections were obtained through the cervical  spine using a single helical acquisition, with and without intravenous contrast.   Additional sagittal and coronal reconstructions of the spine were obtained.   This scan was performed using automatic exposure control (radiation dose  reduction software) to obtain a diagnostic image quality scan with patient dose  as low as reasonable achievable.   Estimated dose for this examination in total  is DLP 1001 mGY-cm.  FINDINGS:   No prior similar studies are available for review  Cervical vertebral body heights are maintained.  No vertebral fracture in seen.   No destructive bone lesion is found.  Facet joints appear intact and aligned.    Alignment is preserved.    No abnormal enhancement is appreciated.  No cord lesion is seen.  No evidence of  epidural or paraspinal mass or abscess.  Disc space narrowing and vertebral spurring at C5-6.  No significant productive  changes are found.  No high-grade central canal compromise is recognized by the  CT technique.  Neural foramina appear intact.  MR would be required to evaluate  the intervertebral discs at higher sensitivity for disc pathology.    The skull base appears intact.  No neck mass is recognized.  Paraspinal soft  tissues appear intact.  Visualized lymph nodes appear to be within physiologic  size limits.  There is a multinodular goiter.      11/18/2013   IMPRESSION:  No evidence of mass or abscess. MR is suggested for additional evaluation of the cord. Mild degenerative changes. Multinodular goiter.    THIS  DOCUMENT HAS BEEN ELECTRONICALLY SIGNED BY Viviann SpareSTEVEN LEFFLER MD    Ct Spine Lang [place] W Cont    10/22/2013   CT thoracic spine with IV contrast  performed on 10/22/2013 1:02 PM    for patient      female of 62 years  CLINICAL INFORMATION:  Leg weakness   TECHNIQUE: Following intravenous contrast, contiguous axial 2.5 mm sections were  obtained through the thoracic spine using a single helical acquisition.   Additional sagittal and coronal reconstructions of the spine were obtained.   This scan was performed using automatic exposure control (radiation dose  reduction software) to obtain a diagnostic image quality scan with patient dose  as low as reasonably achievable. Estimated dose for this examination in total is  DLP 2247 mGY-cm.  FINDINGS:  No prior similar studes are available for review.  Mild dextroscoliosis with apex at T7.  Thoracic vertebral body heights are  maintained.  No vertebral fracture is seen.  No destructive bone lesion is  found.  Facet joints appear aligned. Alignment is preserved.    Thoracic intervertebral disc spaces appear intact.  Mild multilevel vertebral  spurring.  No high-grade central canal compromise is recognized by the CT  technique.  Neural foramina appear intact.  MR would be required to evaluate the  intervertebral discs at higher sensitivity for disc pathology.  No paraspinal mass is recognized.  No enhancing lesions.  No evidence of  epidural or paraspinal abscess. Paraspinal soft tissues appear intact.    Enlarged heterogeneous thyroid with calcification.  Dilated aortic root, 4.0 cm  AP and ascending aorta 4.3 cm AP.  Dilated main pulmonary outflow tract, 40 mm,  which may be seen in pulmonary arterial hypertension.  Cardiomegaly.  Lungs are  clear.  Small RIGHT renal cyst.      10/22/2013   IMPRESSION:   No vertebral fracture is recognized. Unremarkable CT of the thoracic spine. Other findings as described.  THIS DOCUMENT HAS BEEN ELECTRONICALLY SIGNED BY STEVEN LEFFLER MD    Ct  Spine Lumb Wo Cont    10/20/2013   CT lumbar spine without IV contrast    performed on 10/20/2013 2:17 PM    for patient      female of 62 years  CLINICAL INFORMATION:  L leg weakness   TECHNIQUE:  Continguous axial 2.14mm sections were obtained through the lumbar  spine using a single helical acquisition.  Sagittal , coronal and 3-D color  volume rendered reformatted images were obtained.  This scan was performed using  automatic exposure control (radiation dose reduction software) to obtain a  diagnostic image quality scan with patient dose as low as reasonably achievable.  Estimated dose for this examination in total is DLP centered at mGY-cm.  FINDINGS:  Prior study: None  Levoscoliosis with apex at L3.  Mild anterior wedge deformity of L1.  Scrotal No  destructive bone lesion is found.  Facet joints appear aligned.  The visualized  sacral and pelvic bones appear intact.  Alignment is preserved.    Disc space narrowing at L5-S1.  There is multilevel vertebral spurring.  No  high-grade central canal compromise is recognized by the CT technique.  Neural  foramina appear intact.  MR would be required to evaluate the intervertebral  discs at higher sensitivity for disc pathology.    No paraspinal mass is recognized.  There is hypoattenuation of the RIGHT psoas  muscle attributed to atrophy.  Paraspinal soft tissues appear intact.  Calcified  aorta and iliac arteries.      10/20/2013   IMPRESSION:  Lumbar spondylosis. Mild anterior wedge deformity of L1. RIGHT psoas muscle atrophy.  No discrete lesion of the source  is identified but  followup is recommended if clinically warranted.    THIS DOCUMENT HAS BEEN ELECTRONICALLY SIGNED BY Viviann Spare LEFFLER MD    Ct Spine Lumb W Cont    10/22/2013   CT lumbar spine with IV contrast    performed on 10/22/2013 1:02 PM    for patient      female of 62 years  CLINICAL INFORMATION:  Leg weakness   TECHNIQUE:  Following intravenous contrast, contiguous axial 2.82mm sections were  obtained  through the lumbar spine using a single helical acquisition.  Sagittal  and coronal rendered reformatted images were obtained.  This scan was performed  using automatic exposure control (radiation dose reduction software) to obtain  a diagnostic image quality scan with patient dose as low as reasonably  achievable. Estimated dose for this examination in total is DLP 2247 mGY-cm.  FINDINGS:  Prior study: 10/20/2013  Levoscoliosis with apex at L3.  Mild anterior wedge deformity of L1.  No  destructive bone lesion is found.  Facet joints appear aligned.  The visualized  sacral and pelvic bones appear intact.  Alignment is preserved.    Disc space narrowing at L5-S1.  Multilevel vertebral spurring.  No high-grade  central canal compromise is recognized by the CT technique.  Neural foramina  appear intact.  MR would be required to evaluate the intervertebral discs at  higher sensitivity for disc pathology.    No paraspinal mass is recognized.  No enhancing lesion.  No epidural or  paraspinal abscess.  Paraspinal soft tissues appear intact.  Calcified aorta and  iliac arteries.  Rectal fecal impaction.      10/22/2013   IMPRESSION:  No change in lumbar spondylosis. No abnormal enhancement with contrast. Other findings as described.   THIS DOCUMENT HAS BEEN ELECTRONICALLY SIGNED BY STEVEN LEFFLER MD  Labs Reviewed   CBC WITH AUTOMATED DIFF - Abnormal; Notable for the following:     WBC 12.7 (*)     EOSINOPHILS 3 (*)     All other components within normal limits   METABOLIC PANEL, COMPREHENSIVE - Abnormal; Notable for the following:     Anion gap 6 (*)     Calcium 11.0 (*)     Bilirubin, total 1.2 (*)     All other components within normal limits   URINALYSIS W/ RFLX MICROSCOPIC - Abnormal; Notable for the following:     Color RED (*)     Appearance CLOUDY (*)     Protein 100 (*)     Blood LARGE (*)     Nitrites POSITIVE (*)     Leukocyte Esterase MODERATE (*)     All other components within normal limits   PROTEIN, CONFIRM  - Abnormal; Notable for the following:     Protein, confirm 2+ (*)     All other components within normal limits   URINE MICROSCOPIC - Abnormal; Notable for the following:     Bacteria 3+ (*)     All other components within normal limits   CULTURE, URINE     WORKUP NEGATIVE EXCEPT UTI, WILL NEED OUTPT EMG, OK TO RETURN PT TO [facility]. CONTINUE CURRENT CARE, MACROBID FOR UTI, CULTURE PENDING  RTC IF WORSE, PAIN, FEVER, ADDITIONAL NEURO FINDINGS,     <EMERGENCY DEPARTMENT CASE SUMMARY>    Impression/Differential Diagnosis: LEEG WEAKNESS, UTI, CORD COMPRESSION, CONSTIPATION    Plan: EXAM, LABS, CT C SPINE    ED Course: UNREMARKABLE    Final Impression/Diagnosis: BILATERAL LOWER EXTREMITY WEAKNESS,  UTI, FECAL IMPACTION IN RECTUM    Patient condition at time of disposition: STABLE      I have reviewed the following home medications:    Prior to Admission medications    Medication Sig Start Date End Date Taking? Authorizing Provider   polyethylene glycol (MIRALAX) 17 gram packet Take 17 g by mouth daily. Indications: CONSTIPATION   Yes Phys Other, MD   nitrofurantoin, macrocrystal-monohydrate, (MACROBID) 100 mg capsule Take 1 Cap by mouth two (2) times a day for 7 days. 11/18/13 11/25/13 Yes Andreas Blower, MD   docusate sodium (COLACE) 100 mg capsule Take 1 capsule by mouth two (2) times a day for 90 days. 10/22/13 01/20/14 Yes Khawaja S Mir, MD   cholecalciferol, vitamin D3, (VITAMIN D3) 2,000 unit tab Take 2,000 Units by mouth daily.   Yes Historical Provider   aspirin delayed-release 81 mg tablet Take 81 mg by mouth daily.   Yes Phys Other, MD   furosemide (LASIX) 40 mg tablet Take 40 mg by mouth daily.   Yes Phys Other, MD   metoprolol (LOPRESSOR) 100 mg tablet Take 50 mg by mouth daily.   Yes Phys Other, MD   potassium chloride SR (KLOR-CON 10) 10 mEq tablet Take 20 mEq by mouth daily.   Yes Phys Other, MD   simvastatin (ZOCOR) 10 mg tablet Take 10 mg by mouth nightly.    Phys Other, MD         Andreas Blower, MD

## 2013-11-18 NOTE — ED Notes (Signed)
[facility]

## 2013-11-20 LAB — CULTURE, URINE
Culture result:: >100000
Culture result:: >100000

## 2013-11-23 NOTE — ED Notes (Signed)
Final urine culture report rec'd. Pt DC'd on Macrobid which is susceptible to 1 bacteria but not the other. Dr. Elsie Stainoshe [facility]. SW Cristy FriedlanderFlorence, [facility]. Report faxed.

## 2014-08-30 DIAGNOSIS — Z8669 Personal history of other diseases of the nervous system and sense organs: Secondary | ICD-10-CM

## 2014-08-30 HISTORY — DX: Personal history of other diseases of the nervous system and sense organs: Z86.69

## 2015-03-13 ENCOUNTER — Encounter

## 2015-03-28 ENCOUNTER — Inpatient Hospital Stay
Admit: 2015-03-28 | Discharge: 2015-03-28 | Disposition: A | Discharge status: Home / Self Care | Payer: BLUE CROSS/BLUE SHIELD | Attending: Emergency Medicine

## 2015-03-28 DIAGNOSIS — Z043 Encounter for examination and observation following other accident: Secondary | ICD-10-CM

## 2015-03-28 NOTE — ED Notes (Signed)
[facility] will arrive between 5 and 530 pm.

## 2015-03-28 NOTE — Progress Notes (Signed)
Called by Er to assist with transport home.  I met with patient and her husband bedside.  She is wheelchair bound and uses a hoyer lift at home to transfer from bed to wheelchair.  She does not have her Wheelchair here but her aid went home to get it and she has her transfer pad for the wheelchair.  I questioned what service they use and they don't know.  When i asked if this was private pay they told me that they use insurance.  I questioned further, patient has a medicaid.  They do not have the card and don't know if it is a medicaid HMO.  We called Medical transport and they tell us that the patient has [place] [employer].  We contacted them for approval for transport, arrangements made with [facility].  We called [facility] and confirmed transport arrangements for 5-5:30 PM, phone number [phone].  ER aware of arrangement.

## 2015-03-28 NOTE — ED Notes (Signed)
disch to spouse awaiting [facility]

## 2015-03-28 NOTE — ED Notes (Signed)
Patient transport is being arrange by Carley HammedMichelle Gregorio RN to home.

## 2015-03-28 NOTE — ED Notes (Signed)
Patient arrives to the ED via ambulance with complaint of fell onto the floor out of the wheelchair because patient's safety belt wasn't on. "I would like to be checked out" per patient. Patient denies any trauma or pain.

## 2015-03-28 NOTE — ED Provider Notes (Signed)
HPI Comments: Patient with h/o falling of of her wheelchair while the safety belt was on latched.  She has no pain or complaints or evidence of trauma but she came in by ambulance to make sure she was ok.  She fell against her right side to the kitchen floor.  She is a paraplegic but is gradually getting her sensation back to her legs.  The incident occurred a short while before coming here. She denies head trauma, neck pain or chest or abdominal pain. 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      Patient is a 64 y.o. female presenting with fall. The history is provided by the patient and the spouse.   Fall  Associated symptoms include numbness (chronic to both legs but improving lately). Pertinent negatives include no abdominal pain, no nausea, no vomiting, no hematuria and no headaches.        Past Medical History:   Diagnosis Date   ??? Hypertension    ??? Obesity    ??? High cholesterol    ??? Hypokalemia    ??? Vitamin D deficiency    ??? Sleep apnea    ??? Ill-defined condition        Past Surgical History:   Procedure Laterality Date   ??? Hx hip fracture tx Right 2006   ??? Hx femur fracture tx Bilateral 2006         History reviewed. No pertinent family history.    History     Social History   ??? Marital Status: MARRIED     Spouse Name: N/A   ??? Number of Children: N/A   ??? Years of Education: N/A     Occupational History   ??? Not on file.     Social History Main Topics   ??? Smoking status: Never Smoker    ??? Smokeless tobacco: Not on file   ??? Alcohol Use: Yes      Comment: Rare   ??? Drug Use: No   ??? Sexual Activity: Not on file     Other Topics Concern   ??? Not on file     Social History Narrative         ALLERGIES: Levaquin and Pcn    Review of Systems   Constitutional: Negative for fatigue.   HENT: Negative for nosebleeds and rhinorrhea.    Eyes: Negative for visual disturbance.   Respiratory: Negative for chest tightness and shortness of breath.     Cardiovascular: Negative for chest pain, palpitations and leg swelling.   Gastrointestinal: Negative for nausea, vomiting and abdominal pain.   Genitourinary: Negative for hematuria and flank pain.   Musculoskeletal: Negative for myalgias, back pain, joint swelling, arthralgias, neck pain and neck stiffness.   Skin: Positive for wound (old bruising to the left lower leg ). Negative for pallor.   Neurological: Positive for weakness (paraplegic (both legs)) and numbness (chronic to both legs but improving lately). Negative for dizziness, syncope, light-headedness and headaches.   Psychiatric/Behavioral: Negative for confusion and decreased concentration.       Filed Vitals:    03/28/15 1326   BP: 159/85   Pulse: 63   Temp: 99.1 ??F (37.3 ??C)   Resp: 18   Height: 5\' 2"  (1.575 m)   Weight: 103.874 kg (229 lb)   SpO2: 98%            Physical Exam   Constitutional: She is oriented to person, place, and time. She  appears well-developed and well-nourished. No distress.   HENT:   Head: Normocephalic and atraumatic.   Nose: Nose normal.   Mouth/Throat: Oropharynx is clear and moist. No oropharyngeal exudate.   Eyes: Conjunctivae are normal. Pupils are equal, round, and reactive to light. Right eye exhibits no discharge. Left eye exhibits no discharge. No scleral icterus.   Neck: Normal range of motion. Neck supple. No JVD present.   No bogginess tenderness or swelling to cervical vertebrae   Cardiovascular: Normal rate, regular rhythm and normal heart sounds.  Exam reveals no gallop and no friction rub.    No murmur heard.  Pulmonary/Chest: Effort normal and breath sounds normal. No stridor. No respiratory distress. She has no wheezes. She has no rales. She exhibits no tenderness.   Abdominal: Soft. She exhibits no mass. There is no tenderness. There is no rebound and no guarding.   Genitourinary:   No cva or flank tenderness   Musculoskeletal:   No tenderness, swelling or deformity   Passive mobility of all joints of both legs without pain s/p left knee replacement with well healed surgical scar  Good ROM without pain to all joints of both arms   Lymphadenopathy:     She has no cervical adenopathy.   Neurological: She is alert and oriented to person, place, and time. GCS eye subscore is 4. GCS verbal subscore is 5. GCS motor subscore is 6.   Skin: Skin is warm. No rash noted. She is not diaphoretic. No erythema. No pallor.   Moist  Small area of yellow ecchymosis (old as per patient) to left lower leg  Are of discoloration of right lower leg due to past injury   Psychiatric: She has a normal mood and affect.   Nursing note and vitals reviewed.       MDM    Procedures      BP 159/85 mmHg   Pulse 63   Temp(Src) 99.1 ??F (37.3 ??C)   Resp 18   Ht  (1.575 m)   Wt 103.874 kg (229 lb)   BMI 41.87 kg/m2   SpO2 98%    2:52 PM  Patient continues to have no complaints.      <EMERGENCY DEPARTMENT CASE SUMMARY>    Impression/Differential Diagnosis: s/p fall rule out evidence of injury    Plan: exam    ED Course: uneventful    Final Impression/Diagnosis: s/p fall no evidence of apparent injury    Patient condition at time of disposition: stable      I have reviewed the following home medications:    Prior to Admission medications    Medication Sig Start Date End Date Taking? Authorizing Provider   ergocalciferol (ERGOCALCIFEROL) 50,000 unit capsule Take 50,000 Units by mouth every seven (7) days.   Yes Phys Other, MD   aspirin delayed-release 81 mg tablet Take 81 mg by mouth daily.   Yes Phys Other, MD   furosemide (LASIX) 40 mg tablet Take 40 mg by mouth daily.   Yes Phys Other, MD   metoprolol (LOPRESSOR) 100 mg tablet Take 50 mg by mouth daily.   Yes Phys Other, MD   simvastatin (ZOCOR) 10 mg tablet Take 10 mg by mouth nightly.   Yes Phys Other, MD   potassium chloride SR (KLOR-CON 10) 10 mEq tablet Take 40 mEq by mouth daily.   Yes Phys Other, MD    polyethylene glycol (MIRALAX) 17 gram packet Take 17 g by mouth daily. Indications: CONSTIPATION    Phys  Other, MD         Collier Salinaobert E Deseri Loss, MD

## 2015-03-28 NOTE — ED Notes (Signed)
Patient ate 90 percent of lunch.

## 2015-04-10 ENCOUNTER — Encounter

## 2015-04-14 ENCOUNTER — Inpatient Hospital Stay: Admit: 2015-04-14 | Payer: BLUE CROSS/BLUE SHIELD | Attending: Specialist | Primary: Internal Medicine

## 2015-04-14 DIAGNOSIS — G8220 Paraplegia, unspecified: Secondary | ICD-10-CM

## 2015-04-14 LAB — BUN: BUN: 21 mg/dL — ABNORMAL HIGH (ref 7–18)

## 2015-04-14 LAB — CREATININE: Creatinine: 0.93 mg/dL (ref 0.55–1.02)

## 2015-04-14 MED ORDER — GADOPENTETATE DIMEGLUMINE 10 MMOL/20 ML (469.01 MG/ML) IV
10 mmol/20 mL (469.01 mg/mL) | Freq: Once | INTRAVENOUS | Status: AC
Start: 2015-04-14 — End: 2015-04-14
  Administered 2015-04-14: 18:00:00 via INTRAVENOUS

## 2015-04-14 MED FILL — MAGNEVIST 10 MMOL/20 ML (469.01 MG/ML) INTRAVENOUS SOLUTION: 10 mmol/20 mL (469.01 mg/mL) | INTRAVENOUS | Qty: 20

## 2015-06-21 ENCOUNTER — Encounter: Primary: Internal Medicine

## 2015-06-21 ENCOUNTER — Encounter: Admit: 2015-06-21 | Discharge: 2015-06-21 | Payer: BLUE CROSS/BLUE SHIELD | Primary: Internal Medicine

## 2015-07-06 ENCOUNTER — Encounter: Admit: 2015-07-06 | Discharge: 2015-07-06 | Payer: BLUE CROSS/BLUE SHIELD | Primary: Internal Medicine

## 2015-07-17 ENCOUNTER — Encounter: Admit: 2015-07-17 | Discharge: 2015-07-17 | Payer: BLUE CROSS/BLUE SHIELD | Primary: Internal Medicine

## 2015-08-19 ENCOUNTER — Encounter: Primary: Internal Medicine

## 2015-08-22 ENCOUNTER — Encounter: Admit: 2015-08-22 | Discharge: 2015-08-22 | Payer: BLUE CROSS/BLUE SHIELD | Primary: Internal Medicine

## 2015-08-27 ENCOUNTER — Encounter: Admit: 2015-08-27 | Discharge: 2015-08-27 | Payer: BLUE CROSS/BLUE SHIELD | Primary: Internal Medicine

## 2015-08-28 ENCOUNTER — Encounter: Admit: 2015-08-28 | Discharge: 2015-08-28 | Payer: BLUE CROSS/BLUE SHIELD | Primary: Internal Medicine

## 2015-09-01 ENCOUNTER — Encounter: Admit: 2015-09-01 | Discharge: 2015-09-01 | Payer: BLUE CROSS/BLUE SHIELD | Primary: Internal Medicine

## 2015-09-20 ENCOUNTER — Encounter: Admit: 2015-09-20 | Discharge: 2015-09-20 | Payer: BLUE CROSS/BLUE SHIELD | Primary: Internal Medicine

## 2015-09-24 ENCOUNTER — Inpatient Hospital Stay
Admit: 2015-09-24 | Discharge: 2015-09-28 | Disposition: A | Discharge status: Home Health | Payer: BLUE CROSS/BLUE SHIELD | Attending: Internal Medicine | Admitting: Internal Medicine

## 2015-09-24 ENCOUNTER — Inpatient Hospital Stay: Admit: 2015-09-24 | Payer: BLUE CROSS/BLUE SHIELD | Primary: Internal Medicine

## 2015-09-24 ENCOUNTER — Emergency Department: Admit: 2015-09-24 | Payer: BLUE CROSS/BLUE SHIELD | Primary: Internal Medicine

## 2015-09-24 DIAGNOSIS — T83511A Infection and inflammatory reaction due to indwelling urethral catheter, initial encounter: Principal | ICD-10-CM

## 2015-09-24 LAB — CBC WITH AUTOMATED DIFF
ABS. LYMPHOCYTES: 2.5 10*3/uL
ABS. MONOCYTES: 0.8 10*3/uL
ABS. NEUTROPHILS: 15.9 10*3/uL
ATYPICAL LYMPHS: 3 %
BAND NEUTROPHILS: 7 % (ref 0–7)
HCT: 51.8 % — ABNORMAL HIGH (ref 37.0–47.0)
HGB: 16.2 g/dL — ABNORMAL HIGH (ref 12.0–16.0)
LYMPHOCYTES: 10 % — ABNORMAL LOW (ref 21–51)
MCH: 26.1 PG — ABNORMAL LOW (ref 27.0–31.0)
MCHC: 31.3 g/dL (ref 30.5–36.0)
MCV: 83.5 FL (ref 81.0–100.0)
METAMYELOCYTES: 3 % (ref 0–4)
MONOCYTES: 4 % (ref 2–9)
MPV: 11.4 FL (ref 10.2–12.7)
MYELOCYTES: 1 % (ref 0–5)
NEUTROPHILS: 72 % (ref 42–75)
PLATELET ESTIMATE: ADEQUATE
PLATELET: 277 10*3/uL (ref 122–400)
RBC: 6.2 M/uL — ABNORMAL HIGH (ref 4.20–5.40)
RDW: 17.5 % — ABNORMAL HIGH (ref 11.4–14.6)
WBC: 19.2 10*3/uL — ABNORMAL HIGH (ref 4.8–10.8)

## 2015-09-24 LAB — URINALYSIS W/ RFLX MICROSCOPIC
Bilirubin: NEGATIVE
Glucose: NEGATIVE mg/dL
Ketone: NEGATIVE mg/dL
Nitrites: POSITIVE — AB
Protein: 300 mg/dL — AB
Specific gravity: 1.015 (ref 1.005–1.030)
Urobilinogen: 0.2 EU/dL (ref 0.1–1.0)
pH (UA): 7 (ref 4.5–8.0)

## 2015-09-24 LAB — METABOLIC PANEL, COMPREHENSIVE
A-G Ratio: 0.9 — ABNORMAL LOW (ref 1.0–1.5)
ALT (SGPT): 58 U/L (ref 12–78)
AST (SGOT): 52 U/L — ABNORMAL HIGH (ref 15–37)
Albumin: 3.4 g/dL (ref 3.4–5.0)
Alk. phosphatase: 121 U/L — ABNORMAL HIGH (ref 46–116)
Anion gap: 16 mmol/L (ref 8–20)
BUN: 26 mg/dL — ABNORMAL HIGH (ref 7–18)
Bilirubin, total: 0.8 mg/dL (ref 0.2–1.0)
CO2: 24 mmol/L (ref 21–32)
Calcium: 10.4 mg/dL — ABNORMAL HIGH (ref 8.5–10.1)
Chloride: 100 mmol/L (ref 98–107)
Creatinine: 1.56 mg/dL — ABNORMAL HIGH (ref 0.55–1.02)
GFR est AA: 43 mL/min/{1.73_m2} — ABNORMAL LOW (ref >60)
GFR est non-AA: 36 mL/min/{1.73_m2} — ABNORMAL LOW (ref >60)
Globulin: 4 g/dL (ref 2.5–5.0)
Glucose: 195 mg/dL — ABNORMAL HIGH (ref 74–106)
Potassium: 4.2 mmol/L (ref 3.5–5.1)
Protein, total: 7.4 g/dL (ref 6.4–8.2)
Sodium: 140 mmol/L (ref 136–145)

## 2015-09-24 LAB — URINE MICROSCOPIC

## 2015-09-24 LAB — PROTEIN, CONFIRM

## 2015-09-24 LAB — LACTIC ACID: Lactic acid: 5.6 MMOL/L — CR (ref 0.4–2.0)

## 2015-09-24 LAB — INFLUENZA A & B AG (RAPID TEST)
Influenza A Ag: NEGATIVE
Influenza B Ag: NEGATIVE

## 2015-09-24 LAB — MAGNESIUM: Magnesium: 1.5 mg/dL — ABNORMAL LOW (ref 1.8–2.4)

## 2015-09-24 LAB — PTT: aPTT: 25.8 s (ref 24.3–32.2)

## 2015-09-24 LAB — LIPASE: Lipase: 114 U/L (ref 73–393)

## 2015-09-24 LAB — PROTHROMBIN TIME + INR
INR: 1.2 (ref 0.8–1.2)
Prothrombin time: 12.4 s — ABNORMAL HIGH (ref 9.6–11.0)

## 2015-09-24 LAB — TROPONIN I: Troponin-I, Qt.: <0.04 NG/ML (ref 0.00–0.09)

## 2015-09-24 LAB — BNP: BNP: 62 pg/mL (ref 0–100)

## 2015-09-24 MED ORDER — ACETAMINOPHEN 650 MG RECTAL SUPPOSITORY
650 mg | RECTAL | Status: AC
Start: 2015-09-24 — End: 2015-09-24
  Administered 2015-09-24: 20:00:00 via RECTAL

## 2015-09-24 MED ORDER — GENTAMICIN IN SALINE (ISO-OSMOTIC) 120 MG/100 ML IV PIGGY BACK
120 mg/100 mL | INTRAVENOUS | Status: AC
Start: 2015-09-24 — End: 2015-09-24
  Administered 2015-09-24 (×2): via INTRAVENOUS

## 2015-09-24 MED ORDER — HEPARIN (PORCINE) 5,000 UNIT/ML IJ SOLN
5000 unit/mL | Freq: Three times a day (TID) | INTRAMUSCULAR | Status: DC
Start: 2015-09-24 — End: 2015-09-28
  Administered 2015-09-25 – 2015-09-28 (×12): via SUBCUTANEOUS

## 2015-09-24 MED ORDER — ASPIRIN 81 MG CHEWABLE TAB
81 mg | ORAL | Status: AC
Start: 2015-09-24 — End: 2015-09-24
  Administered 2015-09-24: 20:00:00 via ORAL

## 2015-09-24 MED ORDER — SODIUM CHLORIDE 0.9 % IV PIGGY BACK
1 gram | INTRAVENOUS | Status: DC
Start: 2015-09-24 — End: 2015-09-24

## 2015-09-24 MED ORDER — SODIUM CHLORIDE 0.9 % IJ SYRG
INTRAMUSCULAR | Status: DC | PRN
Start: 2015-09-24 — End: 2015-09-28
  Administered 2015-09-25: 11:00:00 via INTRAVENOUS

## 2015-09-24 MED ORDER — METOPROLOL SUCCINATE SR 50 MG 24 HR TAB
50 mg | Freq: Every day | ORAL | Status: DC
Start: 2015-09-24 — End: 2015-09-28
  Administered 2015-09-25 – 2015-09-28 (×4): via ORAL

## 2015-09-24 MED ORDER — SODIUM CHLORIDE 0.9 % IV
INTRAVENOUS | Status: DC
Start: 2015-09-24 — End: 2015-09-27
  Administered 2015-09-24 – 2015-09-27 (×6): via INTRAVENOUS

## 2015-09-24 MED ORDER — SODIUM CHLORIDE 0.9% BOLUS IV
0.9 % | Freq: Once | INTRAVENOUS | Status: AC
Start: 2015-09-24 — End: 2015-09-24
  Administered 2015-09-24: 21:00:00 via INTRAVENOUS

## 2015-09-24 MED ORDER — ACETAMINOPHEN (TYLENOL) SOLUTION 32MG/ML
Freq: Four times a day (QID) | ORAL | Status: DC | PRN
Start: 2015-09-24 — End: 2015-09-28

## 2015-09-24 MED ORDER — AZTREONAM 1 GRAM SOLUTION FOR INJECTION
1 gram | INTRAMUSCULAR | Status: AC
Start: 2015-09-24 — End: 2015-09-24
  Administered 2015-09-24: 20:00:00 via INTRAVENOUS

## 2015-09-24 MED ORDER — FUROSEMIDE 40 MG TAB
40 mg | Freq: Every day | ORAL | Status: DC
Start: 2015-09-24 — End: 2015-09-28
  Administered 2015-09-25 – 2015-09-28 (×4): via ORAL

## 2015-09-24 MED ORDER — SODIUM CHLORIDE 0.9 % IJ SYRG
Freq: Three times a day (TID) | INTRAMUSCULAR | Status: DC
Start: 2015-09-24 — End: 2015-09-28
  Administered 2015-09-25 – 2015-09-28 (×8): via INTRAVENOUS

## 2015-09-24 MED ORDER — ACETAMINOPHEN 650 MG RECTAL SUPPOSITORY
650 mg | RECTAL | Status: AC
Start: 2015-09-24 — End: 2015-09-24

## 2015-09-24 MED ORDER — NOREPINEPHRINE BITARTRATE 1 MG/ML IV
1 mg/mL | INTRAVENOUS | Status: DC
Start: 2015-09-24 — End: 2015-09-24
  Administered 2015-09-24: 22:00:00 via INTRAVENOUS

## 2015-09-24 MED ORDER — POTASSIUM CHLORIDE SR 10 MEQ TAB
10 mEq | Freq: Two times a day (BID) | ORAL | Status: DC
Start: 2015-09-24 — End: 2015-09-27
  Administered 2015-09-25 – 2015-09-27 (×6): via ORAL

## 2015-09-24 MED ORDER — SODIUM CHLORIDE 0.9% BOLUS IV
0.9 % | Freq: Once | INTRAVENOUS | Status: DC
Start: 2015-09-24 — End: 2015-09-24
  Administered 2015-09-24: 22:00:00 via INTRAVENOUS

## 2015-09-24 MED ORDER — SODIUM CHLORIDE 0.9 % IV PIGGY BACK
1 gram | INTRAVENOUS | Status: DC
Start: 2015-09-24 — End: 2015-09-27
  Administered 2015-09-25 – 2015-09-27 (×4): via INTRAVENOUS

## 2015-09-24 MED ORDER — ASPIRIN 81 MG TAB, DELAYED RELEASE
81 mg | Freq: Every day | ORAL | Status: DC
Start: 2015-09-24 — End: 2015-09-28
  Administered 2015-09-25 – 2015-09-28 (×4): via ORAL

## 2015-09-24 MED FILL — GENTAMICIN IN SALINE (ISO-OSMOTIC) 120 MG/100 ML IV PIGGY BACK: 120 mg/100 mL | INTRAVENOUS | Qty: 100

## 2015-09-24 MED FILL — ACEPHEN 650 MG RECTAL SUPPOSITORY: 650 mg | RECTAL | Qty: 1

## 2015-09-24 MED FILL — AZTREONAM 1 GRAM SOLUTION FOR INJECTION: 1 gram | INTRAMUSCULAR | Qty: 1

## 2015-09-24 MED FILL — NORMAL SALINE FLUSH 0.9 % INJECTION SYRINGE: INTRAMUSCULAR | Qty: 10

## 2015-09-24 MED FILL — CHILDREN'S ASPIRIN 81 MG CHEWABLE TABLET: 81 mg | ORAL | Qty: 2

## 2015-09-24 MED FILL — SODIUM CHLORIDE 0.9 % IV: INTRAVENOUS | Qty: 1000

## 2015-09-24 NOTE — Other (Signed)
Bedside and Verbal shift change report given to Marisue BrooklynHannah Palmer (oncoming nurse) by Elyn AquasEllen Blumkin (offgoing nurse). Report included the following information SBAR, Kardex and ED Summary.

## 2015-09-24 NOTE — ED Notes (Signed)
Pt to get foley pulled and will give fluids over several hours as patient has ho some lasxi use and fluid retention.

## 2015-09-24 NOTE — Progress Notes (Signed)
Patient AOx4 arrived from ED in a stretcher with NS bolus continues infusion via 3 lumen TLC Right. Vs: BP 121/74, HR 94, temp. 98.4 F, RR 20. Patient was oriented to the room, call light within the reach. No complains about pain or any discomfort.

## 2015-09-24 NOTE — ED Notes (Signed)
Foley D/C'd as ordered.

## 2015-09-24 NOTE — ED Notes (Addendum)
TRANSFER - OUT REPORT:    Verbal report given to Morgan StanleyEllen Blumkin, RN(name) on Imagene Lynam  being transferred to 1st floor (unit) for routine progression of care       Report consisted of patient???s Situation, Background, Assessment and   Recommendations(SBAR).     Information from the following report(s) ED Summary, Intake/Output, MAR and Recent Results was reviewed with the receiving nurse.    Lines:   Venous Access Device TLC right subclavian 09/24/15 Upper chest (subclavicular area, right (Active)       Peripheral IV 09/24/15 Right Antecubital (Active)   Site Assessment Clean, dry, & intact 09/24/2015  3:14 PM   Phlebitis Assessment 0 09/24/2015  3:14 PM   Infiltration Assessment 0 09/24/2015  3:14 PM   Dressing Status Clean, dry, & intact 09/24/2015  3:14 PM       Peripheral IV 09/24/15 Left Antecubital (Active)   Site Assessment Clean, dry, & intact 09/24/2015  3:15 PM   Phlebitis Assessment 0 09/24/2015  3:15 PM   Dressing Status Clean, dry, & intact 09/24/2015  3:15 PM        Opportunity for questions and clarification was provided.      Patient transported with:   Telemetry, O2 at 2LPM via NC, IVF, tech.  Pt to CT on way to floor. RN on floor aware.

## 2015-09-24 NOTE — Other (Signed)
TRANSFER - IN REPORT:    Verbal report received from Laurena Beringeborah Freeman(name) on Sheri Fry  being received from ED(unit) for routine progression of care      Report consisted of patient???s Situation, Background, Assessment and   Recommendations(SBAR).     Information from the following report(s) SBAR, Kardex, ED Summary and Intake/Output was reviewed with the receiving nurse.    Opportunity for questions and clarification was provided.      Assessment completed upon patient???s arrival to unit and care assumed.

## 2015-09-24 NOTE — H&P (Signed)
[facility]        Hospitalist History and Physical      NAME:  Sheri FilbertRosita Fry   DOB:   [birth year]   MRN:   [id number]  ELOS:   5 days    Date/Time:  09/24/2015  ________________________________________________________    Chief Complaint:  Fever  .  Primary Care Provider: Jabier GaussShuang-Ping Wang, MD MD  History of Present Illness:  This is a 64 y.o. year old female PMHx of cervical cancer, hyperchol, HTN, hypoka, obesity, paraplegia of unknown etiology, sleep apnea, Vit D, chronic indwelling foley by choice placed x 2 weeks ago p/w fever.  States that she had decreased appetite and only having liquid diet for 24 hours but doesn't have appetite for that anymore.  Patient also states that she chose to have foley placed two weeks ago because she is incontinent and does not want skin breakdown.  Denies any chills, N/V/D/C, abdominal pain, chest pain, shortness of breath, palpitations, syncope/LOC.      Past Medical History:   has a past medical history of Cancer (HCC) (1992); High cholesterol; Hypertension; Hypokalemia; Ill-defined condition; Obesity; Paraplegia (HCC); Sleep apnea; and Vitamin D deficiency.   Surgical History:  Past Surgical History   Procedure Laterality Date   ??? Hx hip fracture tx Right 2006   ??? Hx femur fracture tx Bilateral 2006      Social History:  The patient  reports that she has never smoked. She does not have any smokeless tobacco history on file. She reports that she drinks alcohol. She reports that she does not use illicit drugs.   Family History:  family history is not on file.    Home Medications:  Prior to Admission Medications   Prescriptions Last Dose Informant Patient Reported? Taking?   OXYGEN-AIR DELIVERY SYSTEMS   Yes No   Sig: Take 3 L by inhalation nightly.   aspirin delayed-release 81 mg tablet 09/23/2015 at Unknown time  Yes Yes   Sig: Take 81 mg by mouth daily.   ergocalciferol (ERGOCALCIFEROL) 50,000 unit capsule   Yes No    Sig: Take 50,000 Units by mouth every seven (7) days.   furosemide (LASIX) 40 mg tablet 09/23/2015 at Unknown time  Yes Yes   Sig: Take 40 mg by mouth daily.   metoprolol succinate (TOPROL XL) 100 mg tablet 09/23/2015 at Unknown time  Yes Yes   Sig: Take 100 mg by mouth daily. Indications: Hypertension   potassium chloride SR (KLOR-CON 10) 10 mEq tablet 09/23/2015 at Unknown time  Yes Yes   Sig: Take 10 mEq by mouth two (2) times a day.      Facility-Administered Medications: None       Review of Systems:  Constitutional:  No fever, fatigue, or night sweats.   HEENT:  No vision changes or headaches. No hearing loss  Respiratory:   No cough, no audible wheeze, respirations regular.  Cardiovascular:  No palpitations.  Gastrointestinal:  No vomiting, diarrhea or constipation.   Genitourinary:  No dysuria or hematuria.  Metabolic/Endocrine:  No polyuria, polydypsia, or polyphagia. No cold/ heat intolerance.  Neuro/Psychiatric:  No dizziness, no emotional disturbances.  Physical Examination:   General alert, well appearing, and in no distress, obese    Vital signs  Blood pressure 93/64, pulse (!) 112, temperature 98 ??F (36.7 ??C), resp. rate 29, height 5\' 2"  (1.575 m), weight 149.7 kg (330 lb), SpO2 95 %.    Mental status alert, oriented  to person, place, and time    Neck supple, no significant adenopathy    Chest clear to auscultation, no wheezes, rales or rhonchi, symmetric air entry    Heart normal rate, regular rhythm, normal S1, S2, no murmurs, rubs, clicks or gallops    Abdomen soft, nontender, nondistended, no masses or organomegaly    Neurological alert, oriented, normal speech, no focal findings or movement disorder noted    Extremity peripheral pulses normal, no pedal edema, no clubbing or cyanosis    Skin normal coloration and turgor, no rashes, no suspicious skin lesions noted          LABS  Labs: Results:       Chemistry No results for input(s): GLU, NA, K, CL, CO2, BUN, CREA, CA,  AGAP, BUCR, TBIL, GPT, AP, TP, ALB, GLOB, AGRAT in the last 72 hours.   CBC w/Diff Recent Labs      09/24/15   1505   WBC  19.2*   RBC  6.20*   HGB  16.2*   HCT  51.8*   PLT  277   GRANS  PENDING   LYMPH  PENDING   EOS  PENDING      Cardiac Enzymes No results for input(s): CPK, CKND1, MYO in the last 72 hours.    No lab exists for component: CKRMB, TROIP   Coagulation Recent Labs      09/24/15   1505   PTP  12.4*   INR  1.2   APTT  25.8       Liver Enzymes No results for input(s): TP, ALB, TBIL, AP, SGOT, GPT in the last 72 hours.    No lab exists for component: DBIL   Urine Analysis Color   Date Value Ref Range Status   09/24/2015 YELLOW YEL   Final     Appearance   Date Value Ref Range Status   09/24/2015 CLOUDY (A) CLEAR   Final     pH (UA)   Date Value Ref Range Status   09/24/2015 7.0 4.5 - 8.0   Final     Protein   Date Value Ref Range Status   09/24/2015 300 (A) NEG mg/dL Final     Ketone   Date Value Ref Range Status   09/24/2015 NEGATIVE  NEG mg/dL Final     Bilirubin   Date Value Ref Range Status   09/24/2015 NEGATIVE  NEG   Final     Blood   Date Value Ref Range Status   09/24/2015 LARGE (A) NEG   Final     Urobilinogen   Date Value Ref Range Status   09/24/2015 0.2 0.1 - 1.0 EU/dL Final     Nitrites   Date Value Ref Range Status   09/24/2015 POSITIVE (A) NEG   Final     Leukocyte Esterase   Date Value Ref Range Status   09/24/2015 MODERATE (A) NEG   Final      BNP No results for input(s): BNPP in the last 72 hours.         Imaging:      Results from Hospital Encounter encounter on 10/03/13   XR KNEE LT 3 V   Narrative Radiographs of the LEFT knee     performed on 10/03/2013 11:37 AM     for patient      female of 62 years    CLINICAL INFORMATION:  Pain     TECHNIQUE:  Frontal, lateral and oblique views of the knee were  obtained.    FINDINGS:  No prior exams are available for comparison    Intramedullary rod in the femur.  No fracture is seen.  No joint effusion is    found.  No loose body is identified.  Medial and patellofemoral compartments are  narrowed.  Patellar spurring is.      There are dystrophic calcifications in the soft tissues below the knee..  No   radiopaque foreign body is seen.           Impression IMPRESSION: No fracture demonstrated.      THIS DOCUMENT HAS BEEN ELECTRONICALLY SIGNED BY STEVEN LEFFLER MD        EKG:  EKG:     All Lab and radiology data personally reviewed    Assessment/Plan:   Pt  is a 64 y.o. year old female PMHx of as above admitted for UTI causing sepsis  1. UTI - causing sepsis - from chronic indwelling foley.  Patient needs ID consult given several allergies.  Follow up cultures and start cefepime until ID evaluates patient.  IVF hydration as lactate is 5.6. Admit to tele.  Central line placed in ED currently.  Repeat lactate until normalized. CT A/P to be performed to evaluate for pyelo/hydro. Foley to be removed in ED as patient can void in diaper.     Continue all home medications except the following:   - lasix     GI Px - Protonix and venodynes     Hospital Problems  Never Reviewed          Codes Class Noted POA    UTI (urinary tract infection) ICD-10-CM: N39.0  ICD-9-CM: 599.0  09/24/2015 Unknown                 DVT Prophylaxis: started on Heparin sc, and SCD, OOB to chair, early ambulation.   Code Status: Full Code  Disposition: anticipate d/c home once clinically improved and cleared by ID .  Primary Care Provider: Jabier Gauss, MD    I spoke to the patient in detail at bedside.   The patient was updated on lab and radiology results and further plan of care.   All questions and queries addressed and patient verbalized understanding.   Further management as per recommendations of consultants and clinical presentation.     Jacaria Colburn F. Makaelah Cranfield, D.O  Electronic Signature  Hospitalist Attending  09/24/15   4:02 PM
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
## 2015-09-24 NOTE — ED Notes (Addendum)
O2 at 2LPM via NC placed on pt.

## 2015-09-24 NOTE — ED Notes (Signed)
PCXR done.

## 2015-09-24 NOTE — ED Notes (Signed)
VS reviewed with Dr. Vanroekens.

## 2015-09-24 NOTE — ED Notes (Addendum)
Pt have central line placed. Consent obtained. IV antibiotics finished. 2 peripheral lines running with NS at 999/hr at this time.

## 2015-09-24 NOTE — ED Notes (Addendum)
3 liters NS running through TLC. Pt to get total of .

## 2015-09-24 NOTE — ED Notes (Signed)
Await studies

## 2015-09-24 NOTE — ED Triage Notes (Signed)
Pt BIB EMS from home, pt states she had a fever this morning rectally of 104. Pt here for an eval.

## 2015-09-24 NOTE — Other (Addendum)
Bedside and Verbal shift change report given to Marisue BrooklynHannah Palmer RN (oncoming nurse) by Valora PiccoloEllen Blumpkin RN (offgoing nurse). Report included the following information SBAR and Kardex

## 2015-09-25 LAB — CBC WITH AUTOMATED DIFF
ABS. BASOPHILS: 0 10*3/uL (ref 0.0–0.1)
ABS. EOSINOPHILS: 0.1 10*3/uL (ref 0.0–0.2)
ABS. LYMPHOCYTES: 3.2 10*3/uL (ref 1.0–5.5)
ABS. MONOCYTES: 0.9 10*3/uL (ref 0.1–1.0)
ABS. NEUTROPHILS: 11.3 10*3/uL — ABNORMAL HIGH (ref 2.0–8.1)
BASOPHILS: 0 % (ref 0.0–1.0)
EOSINOPHILS: 1 % (ref 0.0–2.0)
HCT: 40.7 % (ref 37.0–47.0)
HGB: 12.7 g/dL (ref 12.0–16.0)
LYMPHOCYTES: 21 % (ref 20.5–51.1)
MCH: 26.8 PG — ABNORMAL LOW (ref 27.0–31.0)
MCHC: 31.2 g/dL (ref 30.5–36.0)
MCV: 86 FL (ref 81.0–100.0)
MONOCYTES: 6 % (ref 1.7–10.0)
MPV: 11.5 FL (ref 10.2–12.7)
NEUTROPHILS: 72 % (ref 42.2–75.2)
PLATELET: 164 10*3/uL (ref 122–400)
RBC: 4.73 M/uL (ref 4.20–5.40)
RDW: 17.2 % — ABNORMAL HIGH (ref 11.4–14.6)
WBC: 15.5 10*3/uL — ABNORMAL HIGH (ref 4.8–10.8)

## 2015-09-25 LAB — LACTIC ACID
Lactic acid: 3.3 MMOL/L — CR (ref 0.4–2.0)
Lactic acid: 2 MMOL/L (ref 0.4–2.0)

## 2015-09-25 LAB — METABOLIC PANEL, COMPREHENSIVE
A-G Ratio: 0.7 — ABNORMAL LOW (ref 1.0–1.5)
ALT (SGPT): 40 U/L (ref 12–78)
AST (SGOT): 28 U/L (ref 15–37)
Albumin: 2.6 g/dL — ABNORMAL LOW (ref 3.4–5.0)
Alk. phosphatase: 79 U/L (ref 46–116)
Anion gap: 11 mmol/L (ref 8–20)
BUN: 24 mg/dL — ABNORMAL HIGH (ref 7–18)
Bilirubin, total: 0.6 mg/dL (ref 0.2–1.0)
CO2: 21 mmol/L (ref 21–32)
Calcium: 8.5 mg/dL (ref 8.5–10.1)
Chloride: 106 mmol/L (ref 98–107)
Creatinine: 1.13 mg/dL — ABNORMAL HIGH (ref 0.55–1.02)
GFR est AA: >60 mL/min/{1.73_m2} (ref >60)
GFR est non-AA: 52 mL/min/{1.73_m2} — ABNORMAL LOW (ref >60)
Globulin: 3.5 g/dL (ref 2.5–5.0)
Glucose: 198 mg/dL — ABNORMAL HIGH (ref 74–106)
Potassium: 4.1 mmol/L (ref 3.5–5.1)
Protein, total: 6.1 g/dL — ABNORMAL LOW (ref 6.4–8.2)
Sodium: 138 mmol/L (ref 136–145)

## 2015-09-25 LAB — MAGNESIUM: Magnesium: 1.5 mg/dL — ABNORMAL LOW (ref 1.8–2.4)

## 2015-09-25 MED ORDER — SODIUM CHLORIDE 0.9 % IV PIGGY BACK
1000 mg | INTRAVENOUS | Status: DC
Start: 2015-09-25 — End: 2015-09-26
  Administered 2015-09-26: 02:00:00 via INTRAVENOUS

## 2015-09-25 MED FILL — POTASSIUM CHLORIDE SR 10 MEQ TAB: 10 mEq | ORAL | Qty: 1

## 2015-09-25 MED FILL — HEPARIN (PORCINE) 5,000 UNIT/ML IJ SOLN: 5000 unit/mL | INTRAMUSCULAR | Qty: 1

## 2015-09-25 MED FILL — ASPIRIN 81 MG TAB, DELAYED RELEASE: 81 mg | ORAL | Qty: 1

## 2015-09-25 MED FILL — FUROSEMIDE 40 MG TAB: 40 mg | ORAL | Qty: 1

## 2015-09-25 MED FILL — SODIUM CHLORIDE 0.9 % IV: INTRAVENOUS | Qty: 5000

## 2015-09-25 MED FILL — MAXIPIME 1 GRAM SOLUTION FOR INJECTION: 1 gram | INTRAMUSCULAR | Qty: 1

## 2015-09-25 MED FILL — METOPROLOL SUCCINATE SR 50 MG 24 HR TAB: 50 mg | ORAL | Qty: 2

## 2015-09-25 NOTE — Progress Notes (Signed)
Pt left cpap at home, uses nightly for sleep apnea, RT set up hospital cpap with nasal mask and husband will bring pt's own tomorrow.

## 2015-09-25 NOTE — Other (Signed)
Bedside and Verbal shift change report given to toszmanskirn (oncoming nurse) by Carol A Montalto, RN   (offgoing nurse). Report included the following information SBAR, Kardex and MAR.

## 2015-09-25 NOTE — Consults (Signed)
Urology Consultation      Requesting Physician: Dinani    Chief Complaint: hydronephrosis    History of Present Illness: patient with morbid obesity and paraplegia. Chronic incontinence. No h/o retention but has had a foley for the last 2 weeks. Presented with 2 day history of severe intermittent fever and intermittent mild throbbing right flank, nonradiating. No chills. No hematuria. Urine has been cloudy.     Past Medical History:   Past Medical History   Diagnosis Date   ??? Cancer (HCC) 1992     edometrial   ??? High cholesterol    ??? Hypertension    ??? Hypokalemia    ??? Ill-defined condition    ??? Obesity    ??? Paraplegia (HCC)    ??? Sleep apnea    ??? Vitamin D deficiency      Past Surgical History:     Past Surgical History   Procedure Laterality Date   ??? Hx hip fracture tx Right 2006   ??? Hx femur fracture tx Bilateral 2006        Medications:   Current Facility-Administered Medications   Medication Dose Route Frequency Provider Last Rate Last Dose   ??? vancomycin (VANCOCIN) 1,000 mg in 0.9% sodium chloride (MBP/ADV) 250 mL adv  1,000 mg IntraVENous Q24H Leane Para, DO 125 mL/hr at 09/25/15 2043 1,000 mg at 09/25/15 2043   ??? 0.9% sodium chloride infusion  100 mL/hr IntraVENous CONTINUOUS Arvella Nigh, MD 100 mL/hr at 09/24/15 2033 100 mL/hr at 09/24/15 2033   ??? cefepime (MAXIPIME) 1 g in 0.9% sodium chloride (MBP/ADV) 50 mL MBP  1 g IntraVENous Q24H Sakina Firoz Dinani, DO   Stopped at 09/25/15 1907   ??? metoprolol succinate (TOPROL-XL) XL tablet 100 mg  100 mg Oral DAILY Sakina Firoz Dinani, DO   100 mg at 09/25/15 0943   ??? furosemide (LASIX) tablet 40 mg  40 mg Oral DAILY Sakina Firoz Dinani, DO   40 mg at 09/25/15 1610   ??? aspirin delayed-release tablet 81 mg  81 mg Oral DAILY Sakina Firoz Dinani, DO   81 mg at 09/25/15 9604   ??? potassium chloride SR (KLOR-CON 10) tablet 10 mEq  10 mEq Oral BID Sakina Firoz Dinani, DO   10 mEq at 09/25/15 1759    ??? sodium chloride (NS) flush 5-10 mL  5-10 mL IntraVENous Q8H Sakina Firoz Dinani, DO   10 mL at 09/25/15 1348   ??? sodium chloride (NS) flush 5-10 mL  5-10 mL IntraVENous PRN Sakina Firoz Dinani, DO   10 mL at 09/25/15 0534   ??? acetaminophen (TYLENOL) solution 650 mg  650 mg Oral Q6H PRN Sakina Firoz Dinani, DO       ??? heparin (porcine) injection 5,000 Units  5,000 Units SubCUTAneous Q8H Sakina Firoz Dinani, DO   5,000 Units at 09/25/15 1800        Allergies:   Allergies   Allergen Reactions   ??? Levaquin [Levofloxacin] Other (comments)     Pt reports she does not remember but it was bad   ??? Pcn [Penicillins] Unknown (comments)     "I don't know what happens, I was told never to take it" states patient.        Family History:   History reviewed. No pertinent family history.     Social History:   Social History     Social History   ??? Marital status: MARRIED     Spouse name: N/A   ??? Number  of children: N/A   ??? Years of education: N/A     Occupational History   ??? Not on file.     Social History Main Topics   ??? Smoking status: Never Smoker   ??? Smokeless tobacco: Not on file   ??? Alcohol use Yes      Comment: Rare   ??? Drug use: No   ??? Sexual activity: Not on file     Other Topics Concern   ??? Not on file     Social History Narrative        Review of Systems - History obtained from the patient  General ROS: positive for  - fever  Psychological ROS: negative for - depression  ENT ROS: negative for - nasal congestion or nasal discharge  Hematological and Lymphatic ROS: negative for - bleeding problems  Endocrine ROS: negative for - polydipsia/polyuria  Respiratory ROS: no cough, shortness of breath, or wheezing  Cardiovascular ROS: negative for - chest pain  Gastrointestinal ROS: no abdominal pain, change in bowel habits, or black or bloody stools  Genito-Urinary ROS: negative for - hematuria  Musculoskeletal ROS: negative  negative for - back pain  Neurological ROS: no TIA or stroke symptoms  Dermatological ROS: negative   negative for - pruritus or rash     Physical Exam:   Constitutional:  Well developed, well nourished. In no acute distress.  Visit Vitals   ??? BP 151/67 (BP 1 Location: Left arm, BP Patient Position: At rest)   ??? Pulse 93   ??? Temp 98.4 ??F (36.9 ??C)   ??? Resp 20   ??? Ht 5\' 2"  (1.575 m)   ??? Wt 149.7 kg (330 lb)   ??? SpO2 95%   ??? Breastfeeding No   ??? BMI 60.36 kg/m2      Neurological/Psychiatric: oriented to person, place and time  Skin: inspection normal  Neck: supple. Nontender. No masses   Respiratory: normal respiratory effort  Cardiovascular: regular rate and rhythm. No leg edema  Lymphatic: no cervical or inguinal adenopathy  Gastrointestinal: obese, abdomen soft, nontender. No masses or organomegaly         Laboratory Tests:  CBC:   Lab Results   Component Value Date/Time    WBC 15.5 09/25/2015 05:40 AM    RBC 4.73 09/25/2015 05:40 AM    HGB 12.7 09/25/2015 05:40 AM    HCT 40.7 09/25/2015 05:40 AM    PLATELET 164 09/25/2015 05:40 AM     BMP:   Lab Results   Component Value Date/Time    Glucose 198 09/25/2015 05:40 AM    Sodium 138 09/25/2015 05:40 AM    Potassium 4.1 09/25/2015 05:40 AM    Chloride 106 09/25/2015 05:40 AM    CO2 21 09/25/2015 05:40 AM    BUN 24 09/25/2015 05:40 AM    Creatinine 1.13 09/25/2015 05:40 AM    Calcium 8.5 09/25/2015 05:40 AM     Lab Results   Component Value Date/Time    Color YELLOW 09/24/2015 02:58 PM    Appearance CLOUDY 09/24/2015 02:58 PM    Specific gravity 1.015 09/24/2015 02:58 PM    pH (UA) 7.0 09/24/2015 02:58 PM    Protein 300 09/24/2015 02:58 PM    Glucose NEGATIVE  09/24/2015 02:58 PM    Ketone NEGATIVE  09/24/2015 02:58 PM    Bilirubin NEGATIVE  09/24/2015 02:58 PM    Urobilinogen 0.2 09/24/2015 02:58 PM    Nitrites POSITIVE 09/24/2015 02:58 PM    Leukocyte  Esterase MODERATE 09/24/2015 02:58 PM    Bacteria 3+ 09/24/2015 02:58 PM    WBC TOO NUMEROUS TO COUNT 09/24/2015 02:58 PM    RBC 50-100 09/24/2015 02:58 PM    Casts NONE 09/24/2015 02:58 PM     Crystals NONE 11/18/2013 12:30 PM       Radiology Tests: Ct Abd Pelv Wo Cont    Result Date: 09/24/2015  EXAM:   CT Abdomen and Pelvis Without Intravenous Contrast. CLINICAL HISTORY:   64 years old, female; Eval of pyelo/hydro TECHNIQUE:   Axial computed tomography images of the abdomen and pelvis without intravenous  contrast.  This CT exam was performed using one or more of the following dose reduction techniques:  automated exposure control, adjustment of the mA and/or kV according to patient size, and/or use of iterative reconstruction technique.   Coronal and sagittal reformatted images were created and reviewed. EXAM DATE/TIME:   09/24/2015 5:46 PM COMPARISON:   CT L SPINE W CONTRAST 10/22/2013 1:22:18 PM FINDINGS:   Lower thorax: No acute findings.  ABDOMEN:   Liver:  Findings consistent with fatty infiltration of the liver are identified.   Gallbladder and bile ducts:  Gallstones are identified although there are no CT findings to suggest cholecystitis.  No ductal dilation.   Pancreas:  Unremarkable.  No ductal dilation.   Spleen:  Unremarkable.  No splenomegaly.   Adrenals:  Unremarkable.  No mass.   Kidneys and ureters:  There is bilateral hydroureteronephrosis. There is no obstructing ureteral mass or calcification. There is edema of both kidneys. There are bilateral renal calcifications.  PELVIS:   Bladder:  There is wall thickening of the bladder.  No stones.   Reproductive:  Unremarkable as visualized.   Appendix:  No findings to suggest acute appendicitis.  ABDOMEN and PELVIS:   Stomach and bowel:  Unremarkable.  No obstruction.  No mucosal thickening.   Peritoneum:  Unremarkable.  No significant fluid collection.  No free air.   Lymph nodes:  There is multifocal bulky left inguinal adenopathy with a short axis diameter of 3.1 cm. There is also retroperitoneal adenopathy.   Vasculature:  Unremarkable.  No aortic aneurysm.   Bones:  There has been ORIF of the bilateral proximal femurs.   Degenerative change is identified in the spine.  No acute fracture.     IMPRESSION:     There is bilateral hydroureteronephrosis. This is likely secondary to recently passed stones versus pyelonephritis. There is bladder cystitis. There is adenopathy in the abdomen and pelvis which may be reactive. Malignancy cannot be excluded.If there is desire for further evaluation, a PET scan could be performed. THIS DOCUMENT HAS BEEN ELECTRONICALLY SIGNED BY Tamsen Roers MD    I reviewed CT films. Bladder is empty. There is no ureteral dilation. 1.7cm nonobstructing stone left upper kidney. Mild dilation renal pelvis but I don't appreciate any significant caliectasis.       Assessment: acute pyelonephritis.   Left renal calculus.  I don't think there is any significant obstruction.  Morbid obesity.   paraplegia    Plan: await final culture results. Continue antibiotics as per ID.  No need for stents at this time.   Will eventually need treatment of the stone (probably with ureteroscopy) after infection cleared but this isn't planned during this admission.    Thank you for this consultation.      Hetty Ely, MD  09/25/2015  9:21 PM

## 2015-09-25 NOTE — Other (Signed)
Bedside and Verbal shift change report given to Kirkland Hunarol Montalto RN (oncoming nurse) by Marisue BrooklynHannah Palmer RN (offgoing nurse). Report included the following information SBAR, Kardex and MAR.

## 2015-09-25 NOTE — Progress Notes (Signed)
Blood cultures results reported to dr dinani gram positive cocci in clusters

## 2015-09-25 NOTE — Progress Notes (Signed)
[facility]  [facility])        Hospitalist Progress Note    NAME:  Sheri Fry   DOB:   1951-03-18   MRN:   [id number]   Code status: Full  Admission Day#: Hospital Day: 2    Date/Time:  09/25/2015  Subjective:   Patient seen and examined at bedside.     Review of Systems:  Constitutional:  No fever, fatigue, or night sweats.   HEENT:  No vision changes or headaches. No hearing loss  Cardiovascular:  No palpitations.  Respiratory:   No cough, no audible wheeze, respirations regular.  Gastrointestinal:  No vomiting, diarrhea or constipation.   Genitourinary:  No dysuria or hematuria.  Metabolic/Endocrine:  No polyuria, polydypsia, or polyphagia. No cold/ heat intolerance.  Neuro/Psychiatric:  No dizziness, no emotional disturbances.     Physical Examination:   Vital signs  Blood pressure 110/69, pulse 72, temperature 98 ??F (36.7 ??C), resp. rate 20, height  (1.575 m), weight 149.7 kg (330 lb), SpO2 95 %, not currently breastfeeding.      General alert, well appearing, and in no distress      Mental status alert, oriented to person, place, and time    Neck supple, no significant adenopathy    Chest clear to auscultation, no wheezes, rales or rhonchi, symmetric air entry    Heart normal rate, regular rhythm, normal S1, S2, no murmurs, rubs, clicks or gallops    Abdomen soft, nontender, nondistended, no masses or organomegaly    Neurological alert, oriented, normal speech, no focal findings or movement disorder noted    Extremity peripheral pulses normal, no pedal edema, no clubbing or cyanosis    Skin normal coloration and turgor, no rashes, no suspicious skin lesions noted       LABS    Labs: Results:       Chemistry Recent Labs      09/25/15   0540  09/24/15   1505   GLU  198*  195*   NA  138  140   K  4.1  4.2   CL  106  100   CO2  21  24   BUN  24*  26*   CREA  1.13*  1.56*   CA  8.5  10.4*   AGAP  11  16   AP  79  121*   TP  6.1*  7.4   ALB  2.6*  3.4   GLOB  3.5  4.0    AGRAT  0.7*  0.9*      CBC w/Diff Recent Labs      09/25/15   0540  09/24/15   1505   WBC  15.5*  19.2*   RBC  4.73  6.20*   HGB  12.7  16.2*   HCT  40.7  51.8*   PLT  164  277   GRANS  72  72   LYMPH  21  10*   EOS  1   --       Cardiac Enzymes No results for input(s): CPK, CKND1, MYO in the last 72 hours.    No lab exists for component: CKRMB, TROIP   Coagulation Recent Labs      09/24/15   1505   PTP  12.4*   INR  1.2   APTT  25.8       Liver Enzymes Recent Labs      09/25/15   0540  TP  6.1*   ALB  2.6*   AP  79   SGOT  28      Urine Analysis Color   Date Value Ref Range Status   09/24/2015 YELLOW YEL   Final     Appearance   Date Value Ref Range Status   09/24/2015 CLOUDY (A) CLEAR   Final     pH (UA)   Date Value Ref Range Status   09/24/2015 7.0 4.5 - 8.0   Final     Protein   Date Value Ref Range Status   09/24/2015 300 (A) NEG mg/dL Final     Ketone   Date Value Ref Range Status   09/24/2015 NEGATIVE  NEG mg/dL Final     Bilirubin   Date Value Ref Range Status   09/24/2015 NEGATIVE  NEG   Final     Blood   Date Value Ref Range Status   09/24/2015 LARGE (A) NEG   Final     Urobilinogen   Date Value Ref Range Status   09/24/2015 0.2 0.1 - 1.0 EU/dL Final     Nitrites   Date Value Ref Range Status   09/24/2015 POSITIVE (A) NEG   Final     Leukocyte Esterase   Date Value Ref Range Status   09/24/2015 MODERATE (A) NEG   Final      BNP Recent Labs      09/24/15   1505   BNPP  64            Imaging:      Results from Hospital Encounter encounter on 09/24/15   XR CHEST PORT   Narrative EXAM:   XR Chest, 1 View.    CLINICAL HISTORY:   64 years old, female; Post central line    TECHNIQUE:   Frontal view of the chest.    COMPARISON:   CR - XY CHEST PORTABLE 09/24/2015 3:06:47 PM    FINDINGS:   Lungs:  Low lung volumes   Pleural spaces:  Unremarkable.  No pneumothorax.   Heart:  Cardiomegaly   Mediastinum: Stable   Bones/joints: Stable No acute fracture.   Upper abdomen:  Elevated right hemidiaphragm    Tubes and lines:  Right central line terminates in the superior vena cava.         Impression IMPRESSION:        Right central line terminates in the superior vena cava.        THIS DOCUMENT HAS BEEN ELECTRONICALLY SIGNED BY Paulita FujitaELPHIA CLARKE MD        Assessment/Plan:   Pt  is a 64 y.o. year old female PMHx of cervical cancer, hyperchol, HTN, hypokalemia, obesity, paraplegia of unknown etiology, sleep apnea, Vit D, chronic indwelling foley by choice admitted for UTI secondary to catheter and also found to have bilateral hydroureteronephrosis  1. Sepsis secondary to UTI - continue cefepime.  ID and urology to eval patient today.  Continue Cefepime until cultures return and ID evals patient as leukocytosis is downtrending and clincal improvement noted.  Follow up Blood cultures x 2 and UA UC&S. Foley removed and able to void.  Lactate now 2.0  2. Cr improved as well most likely with removal of foley.  Acute renal insufficiency resolved wit hIVF hydration and foley removal       GI/DVT Px - Protonix and venodynes     Hospital Problems  Never Reviewed          Codes Class Noted POA  UTI (urinary tract infection) ICD-10-CM: N39.0  ICD-9-CM: 599.0  09/24/2015 Unknown                   ___________________________________________________  Physical Therapy:  Not ordered at this time 2/2 medical condition    Consults:  ID and Urology    Care Plan discussed with:     Patient      Family        Care Manager        Nursing       Consultant/Specialist :      Disposition:   Home w/ Family       Transfer to another facility:    SNF/LTC       SAH/Rehab     Kwasi Joung F. Laurin Morgenstern, D.O  Electronic Signature  Hospitalist Attending  09/25/15  9:57 AM

## 2015-09-25 NOTE — Consults (Signed)
Infectious Disease Consult    Today's Date: 09/25/2015   Admit Date: 09/24/2015  Date of Consultation:  September 25, 2015  Referring Physician: Regis Bill Dinani, DO    Reason for Consult: UTI Sepsis      HPI:       Patient is a 64 y.o. female with morbid obesity, paraplegia and chronic foley due to immobility placed 2 weeks ago who presents for fever. States chills and sweats for 2-3 days. Yesterday, her aide took her temperature and it was 104.4. Also had a throbbing in her right kidney.    On admission febrile, tachycardic with a leukocytosis  Wbc 19.6 and creat 1.56. Ua wbc TNTC and 50-100 rbc   Urine cx mixed GN flora  CT A/p There is bilateral hydroureteronephrosis. This is likely secondary to recently passed stones versus pyelonephritis. There is bladder cystitis.   Given Aztreonam and Cefepime  1/2 blood cultures GPC       Patient Active Problem List   Diagnosis Code   ??? Morbid obesity with BMI of 45.0-49.9, adult ([place]) E66.01, Z68.42   ??? Fall W19.Merril Abbe   ??? Leg weakness, bilateral M62.81   ??? UTI (urinary tract infection) N39.0     Past Medical History   Diagnosis Date   ??? Cancer ([place]) 1992     edometrial   ??? High cholesterol    ??? Hypertension    ??? Hypokalemia    ??? Ill-defined condition    ??? Obesity    ??? Paraplegia ([place])    ??? Sleep apnea    ??? Vitamin D deficiency       History reviewed. No pertinent family history.   Social History   Substance Use Topics   ??? Smoking status: Never Smoker   ??? Smokeless tobacco: Not on file   ??? Alcohol use Yes      Comment: Rare     Past Surgical History   Procedure Laterality Date   ??? Hx hip fracture tx Right 2006   ??? Hx femur fracture tx Bilateral 2006      Prior to Admission medications    Medication Sig Start Date End Date Taking? Authorizing Provider   metoprolol succinate (TOPROL XL) 100 mg tablet Take 100 mg by mouth daily. Indications: Hypertension 09/20/15  Yes Historical Provider   potassium chloride SR (KLOR-CON 10) 10 mEq tablet Take 10 mEq by mouth two  (2) times a day. 08/22/15  Yes Phys Other, MD   aspirin delayed-release 81 mg tablet Take 81 mg by mouth daily.   Yes Phys Other, MD   furosemide (LASIX) 40 mg tablet Take 40 mg by mouth daily.   Yes Phys Other, MD   OXYGEN-AIR DELIVERY SYSTEMS Take 3 L by inhalation nightly.    Billie Lade, MD   ergocalciferol (ERGOCALCIFEROL) 50,000 unit capsule Take 50,000 Units by mouth every seven (7) days.    Phys Other, MD       Allergies   Allergen Reactions   ??? Levaquin [Levofloxacin] Other (comments)     Pt reports she does not remember but it was bad   ??? Pcn [Penicillins] Unknown (comments)     "I don't know what happens, I was told never to take it" states patient.        Review of Systems: Review of Systems   All other systems reviewed and are negative.  except per HPI    Objective:     Visit Vitals   ??? BP 112/63   ???  Pulse 80   ??? Temp 98.4 ??F (36.9 ??C)   ??? Resp 20   ??? Ht 5' 2"  (1.575 m)   ??? Wt 149.7 kg (330 lb)   ??? SpO2 95%   ??? Breastfeeding No   ??? BMI 60.36 kg/m2      Temp (24hrs), Avg:98.1 ??F (36.7 ??C), Min:97.7 ??F (36.5 ??C), Max:98.4 ??F (36.9 ??C)     Physical Exam   Constitutional: She is oriented to person, place, and time. She appears well-developed and well-nourished. No distress.   HENT:   Head: Normocephalic and atraumatic.   Mouth/Throat: Oropharynx is clear and moist.   Eyes: Conjunctivae and EOM are normal. No scleral icterus.   Neck: Normal range of motion. Neck supple. No tracheal deviation present.   Cardiovascular: Normal heart sounds.    Pulmonary/Chest: Effort normal and breath sounds normal. No respiratory distress. She has no wheezes. She has no rales.   Abdominal: Soft. Bowel sounds are normal. She exhibits no distension. There is no tenderness. There is no rebound.   No CVA tenderness   Musculoskeletal: She exhibits edema.   Neurological: She is alert and oriented to person, place, and time. No cranial nerve deficit.   Skin: Skin is warm and dry. No rash noted. She is not diaphoretic. No  erythema.   Psychiatric: She has a normal mood and affect.         Current meds     Current Facility-Administered Medications   Medication Dose Route Frequency   ??? 0.9% sodium chloride infusion  100 mL/hr IntraVENous CONTINUOUS   ??? cefepime (MAXIPIME) 1 g in 0.9% sodium chloride (MBP/ADV) 50 mL MBP  1 g IntraVENous Q24H   ??? metoprolol succinate (TOPROL-XL) XL tablet 100 mg  100 mg Oral DAILY   ??? furosemide (LASIX) tablet 40 mg  40 mg Oral DAILY   ??? aspirin delayed-release tablet 81 mg  81 mg Oral DAILY   ??? potassium chloride SR (KLOR-CON 10) tablet 10 mEq  10 mEq Oral BID   ??? sodium chloride (NS) flush 5-10 mL  5-10 mL IntraVENous Q8H   ??? sodium chloride (NS) flush 5-10 mL  5-10 mL IntraVENous PRN   ??? acetaminophen (TYLENOL) solution 650 mg  650 mg Oral Q6H PRN   ??? heparin (porcine) injection 5,000 Units  5,000 Units SubCUTAneous Q8H       Data Review:     Recent Results (from the past 24 hour(s))   LACTIC ACID, PLASMA    Collection Time: 09/24/15 10:00 PM   Result Value Ref Range    Lactic acid 3.3 (HH) 0.4 - 2.0 MMOL/L   METABOLIC PANEL, COMPREHENSIVE    Collection Time: 09/25/15  5:40 AM   Result Value Ref Range    Sodium 138 136 - 145 mmol/L    Potassium 4.1 3.5 - 5.1 mmol/L    Chloride 106 98 - 107 mmol/L    CO2 21 21 - 32 mmol/L    Anion gap 11 8 - 20 mmol/L    Glucose 198 (H) 74 - 106 mg/dL    BUN 24 (H) 7 - 18 mg/dL    Creatinine 1.13 (H) 0.55 - 1.02 mg/dL    GFR est AA >60 >60 ml/min/1.81m    GFR est non-AA 52 (L) >60 ml/min/1.758m   Calcium 8.5 8.5 - 10.1 mg/dL    Bilirubin, total 0.6 0.2 - 1.0 mg/dL    ALT 40 12 - 78 U/L    AST 28 15 -  37 U/L    Alk. phosphatase 79 46 - 116 U/L    Protein, total 6.1 (L) 6.4 - 8.2 g/dL    Albumin 2.6 (L) 3.4 - 5.0 g/dL    Globulin 3.5 2.5 - 5.0 g/dL    A-G Ratio 0.7 (L) 1.0 - 1.5     MAGNESIUM    Collection Time: 09/25/15  5:40 AM   Result Value Ref Range    Magnesium 1.5 (L) 1.8 - 2.4 mg/dL   CBC WITH AUTOMATED DIFF    Collection Time: 09/25/15  5:40 AM    Result Value Ref Range    WBC 15.5 (H) 4.8 - 10.8 K/uL    RBC 4.73 4.20 - 5.40 M/uL    HGB 12.7 12.0 - 16.0 g/dL    HCT 40.7 37.0 - 47.0 %    MCV 86.0 81.0 - 100.0 FL    MCH 26.8 (L) 27.0 - 31.0 PG    MCHC 31.2 30.5 - 36.0 g/dL    RDW 17.2 (H) 11.4 - 14.6 %    PLATELET 164 122 - 400 K/uL    MPV 11.5 10.2 - 12.7 FL    NEUTROPHILS 72 42.2 - 75.2 %    LYMPHOCYTES 21 20.5 - 51.1 %    MONOCYTES 6 1.7 - 10.0 %    EOSINOPHILS 1 0.0 - 2.0 %    BASOPHILS 0 0.0 - 1.0 %    ABS. NEUTROPHILS 11.3 (H) 2.0 - 8.1 K/UL    ABS. LYMPHOCYTES 3.2 1.0 - 5.5 K/UL    ABS. MONOCYTES 0.9 0.1 - 1.0 K/UL    ABS. EOSINOPHILS 0.1 0.0 - 0.2 K/UL    ABS. BASOPHILS 0.0 0.0 - 0.1 K/UL    DF AUTOMATED     LACTIC ACID, PLASMA    Collection Time: 09/25/15  5:40 AM   Result Value Ref Range    Lactic acid 2.0 0.4 - 2.0 MMOL/L       Microbiology:     12/25 1/2 b/c GPC  12/25 uc mixed GN flora  12/25 flu A/b negative    Imaging:     Ct Abd Pelv Wo Cont    Result Date: 09/24/2015  EXAM:   CT Abdomen and Pelvis Without Intravenous Contrast. CLINICAL HISTORY:   64 years old, female; Eval of pyelo/hydro TECHNIQUE:   Axial computed tomography images of the abdomen and pelvis without intravenous  contrast.  This CT exam was performed using one or more of the following dose reduction techniques:  automated exposure control, adjustment of the mA and/or kV according to patient size, and/or use of iterative reconstruction technique.   Coronal and sagittal reformatted images were created and reviewed. EXAM DATE/TIME:   09/24/2015 5:46 PM COMPARISON:   CT L SPINE W CONTRAST 10/22/2013 1:22:18 PM FINDINGS:   Lower thorax: No acute findings.  ABDOMEN:   Liver:  Findings consistent with fatty infiltration of the liver are identified.   Gallbladder and bile ducts:  Gallstones are identified although there are no CT findings to suggest cholecystitis.  No ductal dilation.   Pancreas:  Unremarkable.  No ductal dilation.   Spleen:  Unremarkable.  No splenomegaly.   Adrenals:   Unremarkable.  No mass.   Kidneys and ureters:  There is bilateral hydroureteronephrosis. There is no obstructing ureteral mass or calcification. There is edema of both kidneys. There are bilateral renal calcifications.  PELVIS:   Bladder:  There is wall thickening of the bladder.  No stones.   Reproductive:  Unremarkable as visualized.   Appendix:  No findings to suggest acute appendicitis.  ABDOMEN and PELVIS:   Stomach and bowel:  Unremarkable.  No obstruction.  No mucosal thickening.   Peritoneum:  Unremarkable.  No significant fluid collection.  No free air.   Lymph nodes:  There is multifocal bulky left inguinal adenopathy with a short axis diameter of 3.1 cm. There is also retroperitoneal adenopathy.   Vasculature:  Unremarkable.  No aortic aneurysm.   Bones:  There has been ORIF of the bilateral proximal femurs.  Degenerative change is identified in the spine.  No acute fracture.     IMPRESSION:     There is bilateral hydroureteronephrosis. This is likely secondary to recently passed stones versus pyelonephritis. There is bladder cystitis. There is adenopathy in the abdomen and pelvis which may be reactive. Malignancy cannot be excluded.If there is desire for further evaluation, a PET scan could be performed. THIS DOCUMENT HAS BEEN ELECTRONICALLY SIGNED BY Mosetta Pigeon MD      Impression:       1. Sepsis with fevers, tachycardia and leukocytosis POA form #2. Poorly controlled    2. Pyelonephritis. Poorly controlled  - CT: There is bilateral hydroureteronephrosis. This is likely secondary to recently passed stones versus pyelonephritis. There is bladder cystitis.    3. GPC BSI. Poorly controlled  ? contamination    4.AKI from #2. Poorly controlled    5. Antibiotic Allergies  - Levaquin: Unknown  - PCN: unknown    Plan:     Continue Cefepime 1 gm IV q24  Repeat blood cultures. Then Start Vancomycin 1 gm IV daily, check trough  Await ID GPC  W/u urine culture   Recommend Urology consult due to bilateral hydroureteronephrosis  Will follow              Signed By: Juluis Rainier, DO     September 25, 2015

## 2015-09-26 LAB — METABOLIC PANEL, COMPREHENSIVE
A-G Ratio: 0.6 — ABNORMAL LOW (ref 1.0–1.5)
ALT (SGPT): 34 U/L (ref 12–78)
AST (SGOT): 20 U/L (ref 15–37)
Albumin: 2.4 g/dL — ABNORMAL LOW (ref 3.4–5.0)
Alk. phosphatase: 76 U/L (ref 46–116)
Anion gap: 9 mmol/L (ref 8–20)
BUN: 14 mg/dL (ref 7–18)
Bilirubin, total: 0.5 mg/dL (ref 0.2–1.0)
CO2: 23 mmol/L (ref 21–32)
Calcium: 8.8 mg/dL (ref 8.5–10.1)
Chloride: 108 mmol/L — ABNORMAL HIGH (ref 98–107)
Creatinine: 0.98 mg/dL (ref 0.55–1.02)
GFR est AA: >60 mL/min/{1.73_m2} (ref >60)
GFR est non-AA: >60 mL/min/{1.73_m2} (ref >60)
Globulin: 3.8 g/dL (ref 2.5–5.0)
Glucose: 210 mg/dL — ABNORMAL HIGH (ref 74–106)
Potassium: 3.7 mmol/L (ref 3.5–5.1)
Protein, total: 6.2 g/dL — ABNORMAL LOW (ref 6.4–8.2)
Sodium: 140 mmol/L (ref 136–145)

## 2015-09-26 LAB — CBC WITH AUTOMATED DIFF
ABS. BASOPHILS: 0 10*3/uL (ref 0.0–0.1)
ABS. EOSINOPHILS: 0.3 10*3/uL — ABNORMAL HIGH (ref 0.0–0.2)
ABS. LYMPHOCYTES: 2.7 10*3/uL (ref 1.0–5.5)
ABS. MONOCYTES: 0.5 10*3/uL (ref 0.1–1.0)
ABS. NEUTROPHILS: 5.5 10*3/uL (ref 2.0–8.1)
BASOPHILS: 0 % (ref 0.0–1.0)
EOSINOPHILS: 3 % — ABNORMAL HIGH (ref 0.0–2.0)
HCT: 38.2 % (ref 37.0–47.0)
HGB: 12.1 g/dL (ref 12.0–16.0)
LYMPHOCYTES: 30 % (ref 20.5–51.1)
MCH: 26.6 PG — ABNORMAL LOW (ref 27.0–31.0)
MCHC: 31.7 g/dL (ref 30.5–36.0)
MCV: 84 FL (ref 81.0–100.0)
MONOCYTES: 5 % (ref 1.7–10.0)
MPV: 11.9 FL (ref 10.2–12.7)
NEUTROPHILS: 62 % (ref 42.2–75.2)
PLATELET: 158 10*3/uL (ref 122–400)
RBC: 4.55 M/uL (ref 4.20–5.40)
RDW: 17 % — ABNORMAL HIGH (ref 11.4–14.6)
WBC: 9 10*3/uL (ref 4.8–10.8)

## 2015-09-26 LAB — CULTURE, BLOOD

## 2015-09-26 LAB — EKG, 12 LEAD, INITIAL
Atrial Rate: 117 {beats}/min
Calculated P Axis: 50 degrees
Calculated R Axis: -10 degrees
Calculated T Axis: 32 degrees
P-R Interval: 136 ms
Q-T Interval: 334 ms
QRS Duration: 106 ms
QTC Calculation (Bezet): 465 ms
Ventricular Rate: 117 {beats}/min

## 2015-09-26 LAB — MAGNESIUM: Magnesium: 1.5 mg/dL — ABNORMAL LOW (ref 1.8–2.4)

## 2015-09-26 MED ORDER — SODIUM CHLORIDE 0.65 % NASAL SPRAY AEROSOL
0.65 % | NASAL | Status: DC | PRN
Start: 2015-09-26 — End: 2015-09-28
  Administered 2015-09-26: via NASAL

## 2015-09-26 MED FILL — METOPROLOL SUCCINATE SR 50 MG 24 HR TAB: 50 mg | ORAL | Qty: 2

## 2015-09-26 MED FILL — ASPIRIN 81 MG TAB, DELAYED RELEASE: 81 mg | ORAL | Qty: 1

## 2015-09-26 MED FILL — HEPARIN (PORCINE) 5,000 UNIT/ML IJ SOLN: 5000 unit/mL | INTRAMUSCULAR | Qty: 1

## 2015-09-26 MED FILL — VANCOMYCIN 1,000 MG IV SOLR: 1000 mg | INTRAVENOUS | Qty: 1000

## 2015-09-26 MED FILL — POTASSIUM CHLORIDE SR 10 MEQ TAB: 10 mEq | ORAL | Qty: 1

## 2015-09-26 MED FILL — FUROSEMIDE 40 MG TAB: 40 mg | ORAL | Qty: 1

## 2015-09-26 MED FILL — SALINE MIST 0.65 % NASAL SPRAY AEROSOL: 0.65 % | NASAL | Qty: 45

## 2015-09-26 NOTE — Progress Notes (Signed)
Pt received from previous shift alert and oriented x 4. Pt has no complaints. IVFs infusing in R neck TLC. Pt has no sensation in BLE. Incontinent of urine and stool. IV abx administered as ordered. Will continue to monitor.

## 2015-09-26 NOTE — Other (Signed)
Bedside and Verbal shift change report given to Haley Breslin (oncoming nurse) by Kathy Cordes (offgoing nurse). Report included the following information SBAR, Kardex, Intake/Output, MAR and Recent Results.

## 2015-09-26 NOTE — Progress Notes (Signed)
RECOMMENDATIONS:        Recommend Check A1C level, May benefit from CCD diet at this time    Initial Nutrition Assessment       MD Consult    MST Screen     Follow-Up    Admitting Dx: UTI (urinary tract infection)  UTI (urinary tract infection)   Medical Hx:   Past Medical History   Diagnosis Date   ??? Cancer ([place]) 1992     edometrial   ??? High cholesterol    ??? Hypertension    ??? Hypokalemia    ??? Ill-defined condition    ??? Leukemia in remission [facility])    ??? Obesity    ??? Paraplegia ([place])    ??? Sleep apnea    ??? Vitamin D deficiency        Diet Order: Cadiac  Allergies: none to food  Labs: Results for ZERIYAH, WAIN (MRN [id number]) as of 09/26/2015 15:53   09/26/2015 09:10   Glucose 210 (H)     Nutritionally Significant Meds: none significant  Assessment    Last 3 Recorded Weights in this Encounter    09/24/15 1455   Weight: 149.7 kg (330 lb)     Height: _0  (157.5 cm)  Body mass index is 60.36 kg/(m^2).  UBW: 330    %UBW: 100%  IBW: 110+-10%    %IBW: 270%    Appetite:  Good Fair  Poor  % Meal Consumed:  <25%  25-50%  50-75%  75-100%   Patient Vitals for the past 100 hrs:   % Diet Eaten   09/26/15 0838 100 %   09/25/15 1235 50 %   09/25/15 1050 100 %      Oral/GI Issues:  Difficulty chewing  Difficulty swallowing  Nausea  Vomiting  Diarrhea  Constipation  Fluid Intake (past 24hrs):  Net I/O: +1818m  IV: 1350   Skin: Intact  Pressure Ulcer  Surgical Wound     Subjective: 64yr old female with morbid obeisty  presents with UTI .Receiovng Cardiac diet with good intake most meals at 100% consumption.  Current BMI=60.  Currently poor glycemic control no history of DM.     Nutrition Rx: 2322kcal ); 149g protein (1g/kg); 23275mfluid (54m36mcal)  [based on:   [facility] Nutrition Diagnosis   Increased protein needs r/t    Decreased sodium/fat needs r/t    Nutrition knowledge deficit r/t    Altered Nutrition related labs r/t glycemic control with no PMNHx of diabetes AEB Glucose 210.    Altered GI Function r/t   Inadequate Oral intake r/t   Inadequate Energy Needs r/t   Obesity r/t   Morbid Obesity r/t excessive calorie intake, paraplegia AEB BMI=60 Excessive CHO Intake r/t   Swallowing Difficulty r/t   Chewing Difficulty r/t      Goals   Wt maintenance Wt Gain Wt Loss   Adherence to diet recommendations   Labs improved   Improved PO intake   Diet advancement w/in 24-48hrs per MD    Tolerance of current diet      Intervention  Recommend:    Continue with current diet    Diet change to:CCD, Cardiac    Supplement with:    Nutrition Support:    PO intake encouraged   Snacks to be provided   Food preferences taken   Diet education provided on previous admission/ reviewed    Referred  to DM educator     Monitoring and Evaluation   Follow for fluid consumption and PO intake   Monitor labs:   Reassess for additional diet reinforcement  Adjust EN/TPN pending lab values and pt tolerance       No cultural, religious, or ethnic dietary needs identified      Cultural, religious and ethnic food preferences identified and addressed      Participated in care plan, discharge planning/Interdisciplinary rounds     Previous Recommendations:     Implemented   Not Implemented (RD to address) Not applicable   Previous Goal:      Met    Not Met (RD to address) Not applicable    [name], RD

## 2015-09-26 NOTE — Other (Signed)
Bedside, Verbal and Written shift change report given to Kathy Cordes (oncoming nurse) by Tiffany Oszmanski (offgoing nurse). Report included the following information SBAR, Kardex and MAR.

## 2015-09-26 NOTE — Progress Notes (Signed)
ID Progress Note  09/26/2015    Subjective:     No fevers or chills  No back pain or abdominal pain    Blood cultures with CoNS    Objective:       Vitals:   Visit Vitals   ??? BP 150/74 (BP 1 Location: Right arm, BP Patient Position: At rest)   ??? Pulse 75   ??? Temp 98.4 ??F (36.9 ??C)   ??? Resp 20   ??? Ht 5' 2"  (1.575 m)   ??? Wt 149.7 kg (330 lb)   ??? SpO2 98%   ??? Breastfeeding No   ??? BMI 60.36 kg/m2        Tmax:  Temp (24hrs), Avg:98.3 ??F (36.8 ??C), Min:98.1 ??F (36.7 ??C), Max:98.4 ??F (36.9 ??C)      Line/Insert Date:  12/25 Right IJ CVC    Physical Exam   Constitutional: She is oriented to person, place, and time. She appears well-developed and well-nourished. No distress.   HENT:   Head: Normocephalic and atraumatic.   Mouth/Throat: Oropharynx is clear and moist.   Eyes: Conjunctivae and EOM are normal. No scleral icterus.   Neck: Normal range of motion. Neck supple. No tracheal deviation present. No thyromegaly present.   Cardiovascular: Normal heart sounds.    Pulmonary/Chest: Effort normal and breath sounds normal. No respiratory distress. She has no wheezes. She has no rales.   Abdominal: Soft. Bowel sounds are normal. She exhibits no distension. There is no tenderness. There is no rebound.   Neurological: She is alert and oriented to person, place, and time. No cranial nerve deficit.   Skin: Skin is warm and dry. She is not diaphoretic.         Medications:    Current Facility-Administered Medications   Medication Dose Route Frequency   ??? sodium chloride (OCEAN) 0.65 % nasal spray 2 Spray  2 Spray Both Nostrils PRN   ??? 0.9% sodium chloride infusion  100 mL/hr IntraVENous CONTINUOUS   ??? cefepime (MAXIPIME) 1 g in 0.9% sodium chloride (MBP/ADV) 50 mL MBP  1 g IntraVENous Q24H   ??? metoprolol succinate (TOPROL-XL) XL tablet 100 mg  100 mg Oral DAILY   ??? furosemide (LASIX) tablet 40 mg  40 mg Oral DAILY   ??? aspirin delayed-release tablet 81 mg  81 mg Oral DAILY    ??? potassium chloride SR (KLOR-CON 10) tablet 10 mEq  10 mEq Oral BID   ??? sodium chloride (NS) flush 5-10 mL  5-10 mL IntraVENous Q8H   ??? sodium chloride (NS) flush 5-10 mL  5-10 mL IntraVENous PRN   ??? acetaminophen (TYLENOL) solution 650 mg  650 mg Oral Q6H PRN   ??? heparin (porcine) injection 5,000 Units  5,000 Units SubCUTAneous Q8H         Labs:        Recent Results (from the past 24 hour(s))   CBC WITH AUTOMATED DIFF    Collection Time: 09/26/15  9:10 AM   Result Value Ref Range    WBC 9.0 4.8 - 10.8 K/uL    RBC 4.55 4.20 - 5.40 M/uL    HGB 12.1 12.0 - 16.0 g/dL    HCT 38.2 37.0 - 47.0 %    MCV 84.0 81.0 - 100.0 FL    MCH 26.6 (L) 27.0 - 31.0 PG    MCHC 31.7 30.5 - 36.0 g/dL    RDW 17.0 (H) 11.4 - 14.6 %    PLATELET 158 122 - 400 K/uL  MPV 11.9 10.2 - 12.7 FL    NEUTROPHILS 62 42.2 - 75.2 %    LYMPHOCYTES 30 20.5 - 51.1 %    MONOCYTES 5 1.7 - 10.0 %    EOSINOPHILS 3 (H) 0.0 - 2.0 %    BASOPHILS 0 0.0 - 1.0 %    ABS. NEUTROPHILS 5.5 2.0 - 8.1 K/UL    ABS. LYMPHOCYTES 2.7 1.0 - 5.5 K/UL    ABS. MONOCYTES 0.5 0.1 - 1.0 K/UL    ABS. EOSINOPHILS 0.3 (H) 0.0 - 0.2 K/UL    ABS. BASOPHILS 0.0 0.0 - 0.1 K/UL    DF AUTOMATED     METABOLIC PANEL, COMPREHENSIVE    Collection Time: 09/26/15  9:10 AM   Result Value Ref Range    Sodium 140 136 - 145 mmol/L    Potassium 3.7 3.5 - 5.1 mmol/L    Chloride 108 (H) 98 - 107 mmol/L    CO2 23 21 - 32 mmol/L    Anion gap 9 8 - 20 mmol/L    Glucose 210 (H) 74 - 106 mg/dL    BUN 14 7 - 18 mg/dL    Creatinine 0.98 0.55 - 1.02 mg/dL    GFR est AA >60 >60 ml/min/1.19m    GFR est non-AA >60 >60 ml/min/1.769m   Calcium 8.8 8.5 - 10.1 mg/dL    Bilirubin, total 0.5 0.2 - 1.0 mg/dL    ALT 34 12 - 78 U/L    AST 20 15 - 37 U/L    Alk. phosphatase 76 46 - 116 U/L    Protein, total 6.2 (L) 6.4 - 8.2 g/dL    Albumin 2.4 (L) 3.4 - 5.0 g/dL    Globulin 3.8 2.5 - 5.0 g/dL    A-G Ratio 0.6 (L) 1.0 - 1.5     MAGNESIUM    Collection Time: 09/26/15  9:10 AM   Result Value Ref Range     Magnesium 1.5 (L) 1.8 - 2.4 mg/dL         Cultures:   12/25 1/2 b/c CoNS  12/25 uc mixed GN flora  12/25 flu A/b negative  ??    Radiology:   No results found.        Assessment:     ??  1. Sepsis with fevers, tachycardia and leukocytosis POA form #2. Improving  ??  2. Pyelonephritis. Improving  - CT: There is bilateral hydroureteronephrosis. This is likely secondary to recently passed stones versus pyelonephritis. There is bladder cystitis.  ??  3. GPC from 1/2 blood cultures suspect contamination   ??  4.AKI from #2. Improved   ??  5. Antibiotic Allergies  - Levaquin: Unknown  - PCN: unknown    Plan:     Continue Cefepime D2  Vancomycin was discontinue    Await W/u urine culture  Urology consult appreciated. Stone to be addressed as outpatient  Will follow  ????    ArJuluis RainierDO

## 2015-09-26 NOTE — Progress Notes (Signed)
[facility]  [facility])        Hospitalist Progress Note    NAME:  Sheri Fry   DOB:   1951/07/04   MRN:   [id number]   Code status: Full  Admission Day#: Hospital Day: 3    Date/Time:  09/26/2015  Subjective:   Patient seen and examined at bedside.     Review of Systems:  Constitutional:  No fever, fatigue, or night sweats.   HEENT:  No vision changes or headaches. No hearing loss  Cardiovascular:  No palpitations.  Respiratory:   No cough, no audible wheeze, respirations regular.  Gastrointestinal:  No vomiting, diarrhea or constipation.   Genitourinary:  No dysuria or hematuria.  Metabolic/Endocrine:  No polyuria, polydypsia, or polyphagia. No cold/ heat intolerance.  Neuro/Psychiatric:  No dizziness, no emotional disturbances.     Physical Examination:   Vital signs  Blood pressure 140/78, pulse 69, temperature 98.1 ??F (36.7 ??C), resp. rate 20, height  (1.575 m), weight 149.7 kg (330 lb), SpO2 98 %, not currently breastfeeding.      General alert, well appearing, and in no distress      Mental status alert, oriented to person, place, and time    Neck supple, no significant adenopathy    Chest clear to auscultation, no wheezes, rales or rhonchi, symmetric air entry    Heart normal rate, regular rhythm, normal S1, S2, no murmurs, rubs, clicks or gallops    Abdomen soft, nontender, nondistended, no masses or organomegaly    Neurological alert, oriented, normal speech, no focal findings or movement disorder noted    Extremity peripheral pulses normal, no pedal edema, no clubbing or cyanosis    Skin normal coloration and turgor, no rashes, no suspicious skin lesions noted       LABS    Labs: Results:       Chemistry Recent Labs      09/26/15   0910  09/25/15   0540  09/24/15   1505   GLU  210*  198*  195*   NA  140  138  140   K  3.7  4.1  4.2   CL  108*  106  100   CO2  BUN  14  24*  26*   CREA  0.98  1.13*  1.56*   CA  8.8  8.5  10.4*   AGAP  AP  76  79  121*   TP  6.2*  6.1*  7.4   ALB  2.4*  2.6*  3.4   GLOB  3.8  3.5  4.0   AGRAT  0.6*  0.7*  0.9*      CBC w/Diff Recent Labs      09/26/15   0910  09/25/15   0540  09/24/15   1505   WBC  9.0  15.5*  19.2*   RBC  4.55  4.73  6.20*   HGB  12.1  12.7  16.2*   HCT  38.2  40.7  51.8*   PLT  158  164  277   GRANS  62  72  72   LYMPH  30  21  10*   EOS  3*  1   --       Cardiac Enzymes No results for input(s): CPK, CKND1, MYO in the last 72 hours.    No  lab exists for component: CKRMB, TROIP   Coagulation Recent Labs      09/24/15   1505   PTP  12.4*   INR  1.2   APTT  25.8       Liver Enzymes Recent Labs      09/26/15   0910   TP  6.2*   ALB  2.4*   AP  76   SGOT  20      Urine Analysis Color   Date Value Ref Range Status   09/24/2015 YELLOW YEL   Final     Appearance   Date Value Ref Range Status   09/24/2015 CLOUDY (A) CLEAR   Final     pH (UA)   Date Value Ref Range Status   09/24/2015 7.0 4.5 - 8.0   Final     Protein   Date Value Ref Range Status   09/24/2015 300 (A) NEG mg/dL Final     Ketone   Date Value Ref Range Status   09/24/2015 NEGATIVE  NEG mg/dL Final     Bilirubin   Date Value Ref Range Status   09/24/2015 NEGATIVE  NEG   Final     Blood   Date Value Ref Range Status   09/24/2015 LARGE (A) NEG   Final     Urobilinogen   Date Value Ref Range Status   09/24/2015 0.2 0.1 - 1.0 EU/dL Final     Nitrites   Date Value Ref Range Status   09/24/2015 POSITIVE (A) NEG   Final     Leukocyte Esterase   Date Value Ref Range Status   09/24/2015 MODERATE (A) NEG   Final      BNP Recent Labs      09/24/15   1505   BNPP  62            Imaging:      Results from Hospital Encounter encounter on 09/24/15   XR CHEST PORT   Narrative EXAM:   XR Chest, 1 View.    CLINICAL HISTORY:   64 years old, female; Post central line    TECHNIQUE:   Frontal view of the chest.    COMPARISON:   CR - XY CHEST PORTABLE 09/24/2015 3:06:47 PM    FINDINGS:   Lungs:  Low lung volumes   Pleural spaces:  Unremarkable.  No pneumothorax.    Heart:  Cardiomegaly   Mediastinum: Stable   Bones/joints: Stable No acute fracture.   Upper abdomen:  Elevated right hemidiaphragm   Tubes and lines:  Right central line terminates in the superior vena cava.         Impression IMPRESSION:        Right central line terminates in the superior vena cava.        THIS DOCUMENT HAS BEEN ELECTRONICALLY SIGNED BY Paulita FujitaELPHIA CLARKE MD        Assessment/Plan:   Pt  is a 64 y.o. year old female PMHx of cervical cancer, hyperchol, HTN, hypokalemia, obesity, paraplegia of unknown etiology, sleep apnea, Vit D, chronic indwelling foley by choice admitted for UTI secondary to catheter and also found to have bilateral hydroureteronephrosis  1. Sepsis secondary to UTI - lekocytosis resolved therefore continue cefepime.  ID and evaluated and stated to continue Cefepime until cultures return and ID evals patient as leukocytosis is downtrending and clincal improvement noted.  Follow up Blood cultures x 2 and UA UC&S. Foley removed and able to void.  Lactate now 2.0  Vanco discontinued as gram +ve in blood cultures likely contaminant.   2. Cr improved as well most likely with removal of foley.  Acute renal insufficiency resolved wit hIVF hydration and foley removal       GI/DVT Px - Protonix and venodynes     Hospital Problems  Never Reviewed          Codes Class Noted POA    * (Principal)Catheter-associated urinary tract infection (HCC) ICD-10-CM: T83.511A, N39.0  ICD-9-CM: 996.64, 599.0  09/24/2015 Yes                   ___________________________________________________  Physical Therapy:  Not ordered at this time 2/2 medical condition    Consults:  ID and Urology    Care Plan discussed with:     Patient      Family        Care Manager        Nursing       Consultant/Specialist :      Disposition:   Home w/ Family       Transfer to another facility:    SNF/LTC       SAH/Rehab     Kenisha Lynds F. Caid Radin, D.O  Electronic Signature  Hospitalist Attending  09/26/15  9:57 AM

## 2015-09-27 LAB — CULTURE, URINE: Culture result:: >100000

## 2015-09-27 LAB — CBC WITH AUTOMATED DIFF
ABS. BASOPHILS: 0 10*3/uL (ref 0.0–0.1)
ABS. EOSINOPHILS: 0.2 10*3/uL (ref 0.0–0.2)
ABS. LYMPHOCYTES: 3.3 10*3/uL (ref 1.0–5.5)
ABS. MONOCYTES: 0.8 10*3/uL (ref 0.1–1.0)
ABS. NEUTROPHILS: 4 10*3/uL (ref 2.0–8.1)
BASOPHILS: 0 % (ref 0.0–1.0)
EOSINOPHILS: 2 % (ref 0.0–2.0)
HCT: 39.7 % (ref 37.0–47.0)
HGB: 12.7 g/dL (ref 12.0–16.0)
LYMPHOCYTES: 40 % (ref 20.5–51.1)
MCH: 26.7 PG — ABNORMAL LOW (ref 27.0–31.0)
MCHC: 32 g/dL (ref 30.5–36.0)
MCV: 83.6 FL (ref 81.0–100.0)
MONOCYTES: 9 % (ref 1.7–10.0)
MPV: 11.7 FL (ref 10.2–12.7)
NEUTROPHILS: 49 % (ref 42.2–75.2)
PLATELET: 181 10*3/uL (ref 122–400)
RBC: 4.75 M/uL (ref 4.20–5.40)
RDW: 16.8 % — ABNORMAL HIGH (ref 11.4–14.6)
WBC: 8.2 10*3/uL (ref 4.8–10.8)

## 2015-09-27 LAB — METABOLIC PANEL, COMPREHENSIVE
A-G Ratio: 0.7 — ABNORMAL LOW (ref 1.0–1.5)
ALT (SGPT): 32 U/L (ref 12–78)
AST (SGOT): 26 U/L (ref 15–37)
Albumin: 2.7 g/dL — ABNORMAL LOW (ref 3.4–5.0)
Alk. phosphatase: 88 U/L (ref 46–116)
Anion gap: 10 mmol/L (ref 8–20)
BUN: 13 mg/dL (ref 7–18)
Bilirubin, total: 0.5 mg/dL (ref 0.2–1.0)
CO2: 25 mmol/L (ref 21–32)
Calcium: 9.1 mg/dL (ref 8.5–10.1)
Chloride: 105 mmol/L (ref 98–107)
Creatinine: 0.83 mg/dL (ref 0.55–1.02)
GFR est AA: >60 mL/min/{1.73_m2} (ref >60)
GFR est non-AA: >60 mL/min/{1.73_m2} (ref >60)
Globulin: 3.8 g/dL (ref 2.5–5.0)
Glucose: 147 mg/dL — ABNORMAL HIGH (ref 74–106)
Potassium: 3.4 mmol/L — ABNORMAL LOW (ref 3.5–5.1)
Protein, total: 6.5 g/dL (ref 6.4–8.2)
Sodium: 139 mmol/L (ref 136–145)

## 2015-09-27 LAB — MAGNESIUM: Magnesium: 1.7 mg/dL — ABNORMAL LOW (ref 1.8–2.4)

## 2015-09-27 MED ORDER — MAGNESIUM SULFATE IN D5W 1 GRAM/100 ML IV PIGGY BACK
1 gram/00 mL | Freq: Once | INTRAVENOUS | Status: AC
Start: 2015-09-27 — End: 2015-09-27
  Administered 2015-09-27: 15:00:00 via INTRAVENOUS

## 2015-09-27 MED ORDER — POTASSIUM CHLORIDE SR 10 MEQ TAB
10 mEq | Freq: Two times a day (BID) | ORAL | Status: DC
Start: 2015-09-27 — End: 2015-09-28
  Administered 2015-09-27 – 2015-09-28 (×2): via ORAL

## 2015-09-27 MED ORDER — POTASSIUM CHLORIDE SR 10 MEQ TAB
10 mEq | ORAL | Status: AC
Start: 2015-09-27 — End: 2015-09-27
  Administered 2015-09-27: 15:00:00 via ORAL

## 2015-09-27 MED FILL — ASPIRIN 81 MG TAB, DELAYED RELEASE: 81 mg | ORAL | Qty: 1

## 2015-09-27 MED FILL — HEPARIN (PORCINE) 5,000 UNIT/ML IJ SOLN: 5000 unit/mL | INTRAMUSCULAR | Qty: 1

## 2015-09-27 MED FILL — POTASSIUM CHLORIDE SR 10 MEQ TAB: 10 mEq | ORAL | Qty: 1

## 2015-09-27 MED FILL — MAXIPIME 1 GRAM SOLUTION FOR INJECTION: 1 gram | INTRAMUSCULAR | Qty: 1

## 2015-09-27 MED FILL — MAGNESIUM SULFATE IN D5W 1 GRAM/100 ML IV PIGGY BACK: 1 gram/00 mL | INTRAVENOUS | Qty: 100

## 2015-09-27 MED FILL — FUROSEMIDE 40 MG TAB: 40 mg | ORAL | Qty: 1

## 2015-09-27 MED FILL — POTASSIUM CHLORIDE SR 10 MEQ TAB: 10 mEq | ORAL | Qty: 2

## 2015-09-27 MED FILL — METOPROLOL SUCCINATE SR 50 MG 24 HR TAB: 50 mg | ORAL | Qty: 2

## 2015-09-27 NOTE — Progress Notes (Signed)
[facility]  [facility])        Hospitalist Progress Note    NAME:  Sheri Fry   DOB:   08-04-1951   MRN:   [id number]   Code status: Full  Admission Day#: Hospital Day: 4    Date/Time:  09/27/2015  Subjective:   Patient seen and examined at bedside.     Review of Systems:  Constitutional:  No fever, fatigue, or night sweats.   HEENT:  No vision changes or headaches. No hearing loss  Cardiovascular:  No palpitations.  Respiratory:   No cough, no audible wheeze, respirations regular.  Gastrointestinal:  No vomiting, diarrhea or constipation.   Genitourinary:  No dysuria or hematuria.  Metabolic/Endocrine:  No polyuria, polydypsia, or polyphagia. No cold/ heat intolerance.  Neuro/Psychiatric:  No dizziness, no emotional disturbances.     Physical Examination:   Vital signs  Blood pressure 116/66, pulse 68, temperature 97.9 ??F (36.6 ??C), resp. rate 22, height  (1.575 m), weight 149.7 kg (330 lb), SpO2 94 %, not currently breastfeeding.      General alert, well appearing, and in no distress      Mental status alert, oriented to person, place, and time    Neck supple, no significant adenopathy    Chest clear to auscultation, no wheezes, rales or rhonchi, symmetric air entry    Heart normal rate, regular rhythm, normal S1, S2, no murmurs, rubs, clicks or gallops    Abdomen soft, nontender, nondistended, no masses or organomegaly    Neurological alert, oriented, normal speech, no focal findings or movement disorder noted    Extremity peripheral pulses normal, no pedal edema, no clubbing or cyanosis    Skin normal coloration and turgor, no rashes, no suspicious skin lesions noted       LABS    Labs: Results:       Chemistry Recent Labs      09/27/15   0540  09/26/15   0910  09/25/15   0540   GLU  147*  210*  198*   NA  139  140  138   K  3.4*  3.7  4.1   CL  105  108*  106   CO2  BUN  13  14  24*   CREA  0.83  0.98  1.13*   CA  9.1  8.8  8.5   AGAP  AP  88  76  79   TP  6.5  6.2*  6.1*   ALB  2.7*  2.4*  2.6*   GLOB  3.8  3.8  3.5   AGRAT  0.7*  0.6*  0.7*      CBC w/Diff Recent Labs      09/27/15   0540  09/26/15   0910  09/25/15   0540   WBC  8.2  9.0  15.5*   RBC  4.75  4.55  4.73   HGB  12.7  12.1  12.7   HCT  39.7  38.2  40.7   PLT  181  158  164   GRANS  49  62  72   LYMPH  40  30  21   EOS  2  3*  1      Cardiac Enzymes No results for input(s): CPK, CKND1, MYO in the last 72 hours.    No lab exists  for component: CKRMB, TROIP   Coagulation No results for input(s): PTP, INR, APTT in the last 72 hours.    No lab exists for component: INREXT, INREXT    Liver Enzymes Recent Labs      09/27/15   0540   TP  6.5   ALB  2.7*   AP  88   SGOT  26      Urine Analysis Color   Date Value Ref Range Status   09/24/2015 YELLOW YEL   Final     Appearance   Date Value Ref Range Status   09/24/2015 CLOUDY (A) CLEAR   Final     pH (UA)   Date Value Ref Range Status   09/24/2015 7.0 4.5 - 8.0   Final     Protein   Date Value Ref Range Status   09/24/2015 300 (A) NEG mg/dL Final     Ketone   Date Value Ref Range Status   09/24/2015 NEGATIVE  NEG mg/dL Final     Bilirubin   Date Value Ref Range Status   09/24/2015 NEGATIVE  NEG   Final     Blood   Date Value Ref Range Status   09/24/2015 LARGE (A) NEG   Final     Urobilinogen   Date Value Ref Range Status   09/24/2015 0.2 0.1 - 1.0 EU/dL Final     Nitrites   Date Value Ref Range Status   09/24/2015 POSITIVE (A) NEG   Final     Leukocyte Esterase   Date Value Ref Range Status   09/24/2015 MODERATE (A) NEG   Final      BNP No results for input(s): BNPP in the last 72 hours.         Imaging:      Results from Hospital Encounter encounter on 09/24/15   XR CHEST PORT   Narrative EXAM:   XR Chest, 1 View.    CLINICAL HISTORY:   64 years old, female; Post central line    TECHNIQUE:   Frontal view of the chest.    COMPARISON:   CR - XY CHEST PORTABLE 09/24/2015 3:06:47 PM    FINDINGS:   Lungs:  Low lung volumes    Pleural spaces:  Unremarkable.  No pneumothorax.   Heart:  Cardiomegaly   Mediastinum: Stable   Bones/joints: Stable No acute fracture.   Upper abdomen:  Elevated right hemidiaphragm   Tubes and lines:  Right central line terminates in the superior vena cava.         Impression IMPRESSION:        Right central line terminates in the superior vena cava.        THIS DOCUMENT HAS BEEN ELECTRONICALLY SIGNED BY Paulita FujitaELPHIA CLARKE MD        Assessment/Plan:   Pt  is a 64 y.o. year old female PMHx of cervical cancer, hyperchol, HTN, hypokalemia, obesity, paraplegia of unknown etiology, sleep apnea, Vit D, chronic indwelling foley by choice admitted for UTI secondary to catheter and also found to have bilateral hydroureteronephrosis  1. Sepsis secondary to UTI - lekocytosis resolved therefore continue cefepime.  ID and evaluated and stated to continue Cefepime until cultures return and ID evals patient as leukocytosis is downtrending and clincal improvement noted.  Follow up Blood cultures x 2 negative and UA UC&S positive for proteus. Foley removed and able to void.  Lactate now resolved and Vanco discontinued as gram +ve in blood cultures likely contaminant.   2. Cr  resolved   3. Hypokalemia and hypomagnesemia - repleted     GI/DVT Px - Protonix and venodynes     Hospital Problems  Never Reviewed          Codes Class Noted POA    * (Principal)Catheter-associated urinary tract infection (HCC) ICD-10-CM: T83.511A, N39.0  ICD-9-CM: 996.64, 599.0  09/24/2015 Yes                   ___________________________________________________  Physical Therapy:  Not ordered at this time 2/2 medical condition    Consults:  ID and Urology    Care Plan discussed with:     Patient      Family        Care Manager        Nursing       Consultant/Specialist :      Disposition:   Home w/ Family       Transfer to another facility:    SNF/LTC       SAH/Rehab     Jewels Langone F. Mariya Mottley, D.O  Electronic Signature  Hospitalist Attending  09/27/15  9:57 AM

## 2015-09-27 NOTE — Other (Signed)
Bedside and Verbal shift change report given to Belva ChimesKen Milo (oncoming nurse) by Molinda BailiffAnn Warren (offgoing nurse). Report included the following information SBAR, Intake/Output, MAR and Recent Results.

## 2015-09-27 NOTE — Progress Notes (Signed)
Received in bed alert and oriented x4. No sob noted. No s/s distress. Respirations even and unlabored.Call bell within reach- will continue to monitor closely.

## 2015-09-27 NOTE — Other (Signed)
Bedside and Verbal shift change report given to Ann Warren (oncoming nurse) by Haley Breslin (offgoing nurse). Report included the following information SBAR, Kardex, Intake/Output, MAR and Recent Results.

## 2015-09-27 NOTE — Progress Notes (Addendum)
ID Progress Note  09/27/2015    Subjective:     No fevers  No back pain  Urine culture grew proteus    Objective:       Vitals:   Visit Vitals   ??? BP 159/79 (BP 1 Location: Right arm, BP Patient Position: At rest)   ??? Pulse 86   ??? Temp 98.1 ??F (36.7 ??C)   ??? Resp 20   ??? Ht '5\' 2"'$  (1.575 m)   ??? Wt 149.7 kg (330 lb)   ??? SpO2 94%   ??? Breastfeeding No   ??? BMI 60.36 kg/m2        Tmax:  Temp (24hrs), Avg:98.4 ??F (36.9 ??C), Min:97.9 ??F (36.6 ??C), Max:99.5 ??F (37.5 ??C)      Line/Insert Date:  12/25 Right IJ CVC    Physical Exam   Constitutional: She is oriented to person, place, and time. She appears well-developed and well-nourished. No distress.   HENT:   Head: Normocephalic and atraumatic.   Mouth/Throat: Oropharynx is clear and moist.   Eyes: Conjunctivae and EOM are normal. No scleral icterus.   Neck: Normal range of motion. Neck supple. No tracheal deviation present. No thyromegaly present.   Cardiovascular: Normal heart sounds.    Pulmonary/Chest: Effort normal and breath sounds normal. No respiratory distress. She has no wheezes. She has no rales.   Abdominal: Soft. Bowel sounds are normal. She exhibits no distension. There is no tenderness. There is no rebound.   Neurological: She is alert and oriented to person, place, and time. No cranial nerve deficit.   Skin: Skin is warm and dry. She is not diaphoretic.         Medications:    Current Facility-Administered Medications   Medication Dose Route Frequency   ??? potassium chloride SR (KLOR-CON 10) tablet 20 mEq  20 mEq Oral BID   ??? sodium chloride (OCEAN) 0.65 % nasal spray 2 Spray  2 Spray Both Nostrils PRN   ??? cefepime (MAXIPIME) 1 g in 0.9% sodium chloride (MBP/ADV) 50 mL MBP  1 g IntraVENous Q24H   ??? metoprolol succinate (TOPROL-XL) XL tablet 100 mg  100 mg Oral DAILY   ??? furosemide (LASIX) tablet 40 mg  40 mg Oral DAILY   ??? aspirin delayed-release tablet 81 mg  81 mg Oral DAILY   ??? sodium chloride (NS) flush 5-10 mL  5-10 mL IntraVENous Q8H    ??? sodium chloride (NS) flush 5-10 mL  5-10 mL IntraVENous PRN   ??? acetaminophen (TYLENOL) solution 650 mg  650 mg Oral Q6H PRN   ??? heparin (porcine) injection 5,000 Units  5,000 Units SubCUTAneous Q8H         Labs:        Recent Results (from the past 24 hour(s))   CBC WITH AUTOMATED DIFF    Collection Time: 09/27/15  5:40 AM   Result Value Ref Range    WBC 8.2 4.8 - 10.8 K/uL    RBC 4.75 4.20 - 5.40 M/uL    HGB 12.7 12.0 - 16.0 g/dL    HCT 39.7 37.0 - 47.0 %    MCV 83.6 81.0 - 100.0 FL    MCH 26.7 (L) 27.0 - 31.0 PG    MCHC 32.0 30.5 - 36.0 g/dL    RDW 16.8 (H) 11.4 - 14.6 %    PLATELET 181 122 - 400 K/uL    MPV 11.7 10.2 - 12.7 FL    NEUTROPHILS 49 42.2 - 75.2 %  LYMPHOCYTES 40 20.5 - 51.1 %    MONOCYTES 9 1.7 - 10.0 %    EOSINOPHILS 2 0.0 - 2.0 %    BASOPHILS 0 0.0 - 1.0 %    ABS. NEUTROPHILS 4.0 2.0 - 8.1 K/UL    ABS. LYMPHOCYTES 3.3 1.0 - 5.5 K/UL    ABS. MONOCYTES 0.8 0.1 - 1.0 K/UL    ABS. EOSINOPHILS 0.2 0.0 - 0.2 K/UL    ABS. BASOPHILS 0.0 0.0 - 0.1 K/UL    DF AUTOMATED     METABOLIC PANEL, COMPREHENSIVE    Collection Time: 09/27/15  5:40 AM   Result Value Ref Range    Sodium 139 136 - 145 mmol/L    Potassium 3.4 (L) 3.5 - 5.1 mmol/L    Chloride 105 98 - 107 mmol/L    CO2 25 21 - 32 mmol/L    Anion gap 10 8 - 20 mmol/L    Glucose 147 (H) 74 - 106 mg/dL    BUN 13 7 - 18 mg/dL    Creatinine 0.83 0.55 - 1.02 mg/dL    GFR est AA >60 >60 ml/min/1.38m    GFR est non-AA >60 >60 ml/min/1.793m   Calcium 9.1 8.5 - 10.1 mg/dL    Bilirubin, total 0.5 0.2 - 1.0 mg/dL    ALT 32 12 - 78 U/L    AST 26 15 - 37 U/L    Alk. phosphatase 88 46 - 116 U/L    Protein, total 6.5 6.4 - 8.2 g/dL    Albumin 2.7 (L) 3.4 - 5.0 g/dL    Globulin 3.8 2.5 - 5.0 g/dL    A-G Ratio 0.7 (L) 1.0 - 1.5     MAGNESIUM    Collection Time: 09/27/15  5:40 AM   Result Value Ref Range    Magnesium 1.7 (L) 1.8 - 2.4 mg/dL         Cultures:   12/25 1/2 b/c CoNS  12/25 uc proteus  12/25 flu A/b negative  ??    Radiology:   No results found.         Assessment:     ??  1. Sepsis with fevers, tachycardia and leukocytosis POA form #2. Improving  ??  2. Proteus Pyelonephritis. Improving  - CT: There is bilateral hydroureteronephrosis. This is likely secondary to recently passed stones versus pyelonephritis. There is bladder cystitis.  ??  3. GPC from 1/2 blood cultures suspect contamination   ??  4.AKI from #2. Improved   ??  5. Antibiotic Allergies  - Levaquin: Unknown  - PCN: unknown    Plan:     Discontinue Cefepime D3  Start Ceftin 500 mg po BID for 5 more days  Will sign off. Please call with questions. Thank you  ????    ArJuluis RainierDO

## 2015-09-27 NOTE — Progress Notes (Signed)
Care Management Interventions  PCP Verified by CM: Yes (Dr. Mina Marble)  Mode of Transport at Discharge: BLS  Transition of Care Consult (CM Consult): Home Health  Current Support Network: Lives with Spouse  Confirm Follow Up Transport: [place] discussed with Pt/Family/Caregiver: Yes  Freedom of Choice Offered: Yes  Discharge Location  Discharge Placement: Home with home health     64 year old patient admitted with sepsis secondary to UTI, pyelonephritis, AKI, IV fluids @ 174m/hr, IV cefepime, IV Heparin, IV magnesium sulfate. Met with pt and her spouse, Lon, at bedside. Pt is wheelchair bound at home, uses a hoyer for transfers. [facility], from 11pm until the morning pt and spouse do not have assistance. [facility]. Pt's spouse requesting [employer] account executivefor transportation home. CM to follow.

## 2015-09-28 LAB — CBC WITH AUTOMATED DIFF
ABS. BASOPHILS: 0.1 10*3/uL
ABS. EOSINOPHILS: 0.2 10*3/uL
ABS. LYMPHOCYTES: 2.7 10*3/uL
ABS. MONOCYTES: 0.8 10*3/uL
ABS. NEUTROPHILS: 4.3 10*3/uL
BASOPHILS: 1 % (ref 0–1)
EOSINOPHILS: 3 % — ABNORMAL HIGH (ref 0–2)
HCT: 41.5 % (ref 37.0–47.0)
HGB: 13 g/dL (ref 12.0–16.0)
LYMPHOCYTES: 33 % (ref 21–51)
MCH: 26.2 PG — ABNORMAL LOW (ref 27.0–31.0)
MCHC: 31.3 g/dL (ref 30.5–36.0)
MCV: 83.5 FL (ref 81.0–100.0)
MONOCYTES: 10 % — ABNORMAL HIGH (ref 2–9)
MPV: 11.4 FL (ref 10.2–12.7)
NEUTROPHILS: 53 % (ref 42–75)
NRBC: 1 PER 100 WBC — ABNORMAL HIGH
PLATELET ESTIMATE: ADEQUATE
PLATELET: 194 10*3/uL (ref 122–400)
RBC: 4.97 M/uL (ref 4.20–5.40)
RDW: 16.8 % — ABNORMAL HIGH (ref 11.4–14.6)
WBC: 8.1 10*3/uL (ref 4.8–10.8)

## 2015-09-28 LAB — METABOLIC PANEL, COMPREHENSIVE
A-G Ratio: 0.7 — ABNORMAL LOW (ref 1.0–1.5)
ALT (SGPT): 39 U/L (ref 12–78)
AST (SGOT): 43 U/L — ABNORMAL HIGH (ref 15–37)
Albumin: 2.8 g/dL — ABNORMAL LOW (ref 3.4–5.0)
Alk. phosphatase: 94 U/L (ref 46–116)
Anion gap: 9 mmol/L (ref 8–20)
BUN: 16 mg/dL (ref 7–18)
Bilirubin, total: 0.4 mg/dL (ref 0.2–1.0)
CO2: 26 mmol/L (ref 21–32)
Calcium: 9.3 mg/dL (ref 8.5–10.1)
Chloride: 104 mmol/L (ref 98–107)
Creatinine: 0.89 mg/dL (ref 0.55–1.02)
GFR est AA: >60 mL/min/{1.73_m2} (ref >60)
GFR est non-AA: >60 mL/min/{1.73_m2} (ref >60)
Globulin: 4.1 g/dL (ref 2.5–5.0)
Glucose: 141 mg/dL — ABNORMAL HIGH (ref 74–106)
Potassium: 3.5 mmol/L (ref 3.5–5.1)
Protein, total: 6.9 g/dL (ref 6.4–8.2)
Sodium: 139 mmol/L (ref 136–145)

## 2015-09-28 LAB — MAGNESIUM: Magnesium: 1.8 mg/dL (ref 1.8–2.4)

## 2015-09-28 MED ORDER — CEFUROXIME AXETIL 500 MG TAB
500 mg | ORAL_TABLET | Freq: Two times a day (BID) | ORAL | 0 refills | Status: AC
Start: 2015-09-28 — End: 2015-10-03

## 2015-09-28 MED ORDER — CEFUROXIME AXETIL 500 MG TAB
500 mg | Freq: Two times a day (BID) | ORAL | Status: DC
Start: 2015-09-28 — End: 2015-09-28
  Administered 2015-09-28: 15:00:00 via ORAL

## 2015-09-28 MED FILL — HEPARIN (PORCINE) 5,000 UNIT/ML IJ SOLN: 5000 unit/mL | INTRAMUSCULAR | Qty: 1

## 2015-09-28 MED FILL — SALINE MIST 0.65 % NASAL SPRAY AEROSOL: 0.65 % | NASAL | Qty: 45

## 2015-09-28 MED FILL — ASPIRIN 81 MG TAB, DELAYED RELEASE: 81 mg | ORAL | Qty: 1

## 2015-09-28 MED FILL — CEFUROXIME AXETIL 500 MG TAB: 500 mg | ORAL | Qty: 1

## 2015-09-28 MED FILL — METOPROLOL SUCCINATE SR 50 MG 24 HR TAB: 50 mg | ORAL | Qty: 2

## 2015-09-28 MED FILL — FUROSEMIDE 40 MG TAB: 40 mg | ORAL | Qty: 1

## 2015-09-28 MED FILL — POTASSIUM CHLORIDE SR 10 MEQ TAB: 10 mEq | ORAL | Qty: 2

## 2015-09-28 NOTE — Discharge Summary (Signed)
Discharge Summary     Patient: Sheri FilbertRosita Fry MRN: [id number]  [id number]    Date of Birth: 06/07/1951  Age: 64 y.o.  Sex: female      Less than 30 mins spent on this discharge.      Admit Date: 09/24/2015    Discharge Date: No discharge date for patient encounter.      Admission Diagnoses:  Principal Diagnosis Catheter-associated urinary tract infection (HCC)  UTI (urinary tract infection)  UTI (urinary tract infection)    Discharge Diagnoses:   Problem List as of 09/28/2015  Never Reviewed          Codes Class Noted - Resolved    * (Principal)Catheter-associated urinary tract infection (HCC) ICD-10-CM: T83.511A, N39.0  ICD-9-CM: 996.64, 599.0  09/24/2015 - Present        Leg weakness, bilateral ICD-10-CM: M62.81  ICD-9-CM: 729.89  10/22/2013 - Present        Morbid obesity with BMI of 45.0-49.9, adult (HCC) ICD-10-CM: E66.01, Z68.42  ICD-9-CM: 278.01, V85.42  10/03/2013 - Present        Fall ICD-10-CM: W19.Lorne SkeensXXXA  ICD-9-CM: E888.9  10/03/2013 - Present               Admission Condition: Poor    Discharge Condition: Stable    Reason for Admission: UTI    Hospital Course: Admitted for UTI found to have UT Isecondary to a foley she chose to have placed two weeks ago.  Started on IV Abx and given 5L of fluid in ED for sepsis secondary to UTI.  Patient improved and responded well to Abx and foley was removed and patient informed that she cannot have foley to decreases chance of havign UTI.   Patient seen and examined on day of discharge and deemed medically stable for discharge at this time.        Physical Examination:   Vital signs  Blood pressure 133/83, pulse 64, temperature 97.7 ??F (36.5 ??C), resp. rate 20, height 5\' 2"  (1.575 m), weight 149.7 kg (330 lb), SpO2 99 %, not currently breastfeeding.      General alert, well appearing, and in no distress      Mental status alert, oriented to person, place, and time    Neck supple, no significant adenopathy     Chest clear to auscultation, no wheezes, rales or rhonchi, symmetric air entry    Heart normal rate, regular rhythm, normal S1, S2, no murmurs, rubs, clicks or gallops    Abdomen soft, nontender, nondistended, no masses or organomegaly    Neurological alert, oriented, normal speech, no focal findings or movement disorder noted    Extremity peripheral pulses normal, no pedal edema, no clubbing or cyanosis    Skin normal coloration and turgor, no rashes, no suspicious skin lesions noted     Procedures/Surgeries: * No surgery found *    Consults: Infectious Disease and Urology    Disposition:  @DISPOSITION @     LABS    Labs: Results:       Chemistry Recent Labs      09/28/15   0620  09/27/15   0540  09/26/15   0910   GLU  141*  147*  210*   NA  139  139  140   K  3.5  3.4*  3.7   CL  104  105  108*   CO2  26  25  23    BUN  16  13  14    CREA  0.89  0.83  0.98   CA  9.3  9.1  8.8   AGAP  9  10  9    AP  94  88  76   TP  6.9  6.5  6.2*   ALB  2.8*  2.7*  2.4*   GLOB  4.1  3.8  3.8   AGRAT  0.7*  0.7*  0.6*      CBC w/Diff Recent Labs      09/28/15   0620  09/27/15   0540  09/26/15   0910   WBC  8.1  8.2  9.0   RBC  4.97  4.75  4.55   HGB  13.0  12.7  12.1   HCT  41.5  39.7  38.2   PLT  194  181  158   GRANS  PENDING  49  62   LYMPH  PENDING  40  30   EOS  PENDING  2  3*      Cardiac Enzymes No results for input(s): CPK, CKND1, MYO in the last 72 hours.    No lab exists for component: CKRMB, TROIP   Coagulation No results for input(s): PTP, INR, APTT in the last 72 hours.    No lab exists for component: Rennie Natter    Liver Enzymes Recent Labs      09/28/15   0620   TP  6.9   ALB  2.8*   AP  94   SGOT  43*      Urine Analysis Color   Date Value Ref Range Status   09/24/2015 YELLOW YEL   Final     Appearance   Date Value Ref Range Status   09/24/2015 CLOUDY (A) CLEAR   Final     pH (UA)   Date Value Ref Range Status   09/24/2015 7.0 4.5 - 8.0   Final     Protein   Date Value Ref Range Status    09/24/2015 300 (A) NEG mg/dL Final     Ketone   Date Value Ref Range Status   09/24/2015 NEGATIVE  NEG mg/dL Final     Bilirubin   Date Value Ref Range Status   09/24/2015 NEGATIVE  NEG   Final     Blood   Date Value Ref Range Status   09/24/2015 LARGE (A) NEG   Final     Urobilinogen   Date Value Ref Range Status   09/24/2015 0.2 0.1 - 1.0 EU/dL Final     Nitrites   Date Value Ref Range Status   09/24/2015 POSITIVE (A) NEG   Final     Leukocyte Esterase   Date Value Ref Range Status   09/24/2015 MODERATE (A) NEG   Final      BNP No results for input(s): BNPP in the last 72 hours.         Imaging:    Ct Abd Pelv Wo Cont    Result Date: 09/24/2015  EXAM:   CT Abdomen and Pelvis Without Intravenous Contrast. CLINICAL HISTORY:   64 years old, female; Eval of pyelo/hydro TECHNIQUE:   Axial computed tomography images of the abdomen and pelvis without intravenous  contrast.  This CT exam was performed using one or more of the following dose reduction techniques:  automated exposure control, adjustment of the mA and/or kV according to patient size, and/or use of iterative reconstruction technique.   Coronal and sagittal reformatted images were created and reviewed. EXAM DATE/TIME:   09/24/2015 5:46 PM COMPARISON:  CT L SPINE W CONTRAST 10/22/2013 1:22:18 PM FINDINGS:   Lower thorax: No acute findings.  ABDOMEN:   Liver:  Findings consistent with fatty infiltration of the liver are identified.   Gallbladder and bile ducts:  Gallstones are identified although there are no CT findings to suggest cholecystitis.  No ductal dilation.   Pancreas:  Unremarkable.  No ductal dilation.   Spleen:  Unremarkable.  No splenomegaly.   Adrenals:  Unremarkable.  No mass.   Kidneys and ureters:  There is bilateral hydroureteronephrosis. There is no obstructing ureteral mass or calcification. There is edema of both kidneys. There are bilateral renal calcifications.  PELVIS:   Bladder:  There is wall thickening of the  bladder.  No stones.   Reproductive:  Unremarkable as visualized.   Appendix:  No findings to suggest acute appendicitis.  ABDOMEN and PELVIS:   Stomach and bowel:  Unremarkable.  No obstruction.  No mucosal thickening.   Peritoneum:  Unremarkable.  No significant fluid collection.  No free air.   Lymph nodes:  There is multifocal bulky left inguinal adenopathy with a short axis diameter of 3.1 cm. There is also retroperitoneal adenopathy.   Vasculature:  Unremarkable.  No aortic aneurysm.   Bones:  There has been ORIF of the bilateral proximal femurs.  Degenerative change is identified in the spine.  No acute fracture.     IMPRESSION:     There is bilateral hydroureteronephrosis. This is likely secondary to recently passed stones versus pyelonephritis. There is bladder cystitis. There is adenopathy in the abdomen and pelvis which may be reactive. Malignancy cannot be excluded.If there is desire for further evaluation, a PET scan could be performed. THIS DOCUMENT HAS BEEN ELECTRONICALLY SIGNED BY Tamsen Roers MD    Xr Chest Port    Result Date: 09/24/2015  EXAM:   XR Chest, 1 View. CLINICAL HISTORY:   64 years old, female; Post central line TECHNIQUE:   Frontal view of the chest. COMPARISON:   CR - XY CHEST PORTABLE 09/24/2015 3:06:47 PM FINDINGS:   Lungs:  Low lung volumes   Pleural spaces:  Unremarkable.  No pneumothorax.   Heart:  Cardiomegaly   Mediastinum: Stable   Bones/joints: Stable No acute fracture.   Upper abdomen:  Elevated right hemidiaphragm   Tubes and lines:  Right central line terminates in the superior vena cava.     IMPRESSION:       Right central line terminates in the superior vena cava.  THIS DOCUMENT HAS BEEN ELECTRONICALLY SIGNED BY Paulita Fujita MD    Xr Chest Port    Result Date: 09/24/2015  EXAM:   XR Chest, 1 View. CLINICAL HISTORY:   64 years old, female; Chest pain TECHNIQUE:   Frontal view of the chest. COMPARISON:   CT SPINE [facility]  W CONT 10/22/2013 1:22:18 PM FINDINGS:   Lungs:  Opacities in the  left base may represent atelectasis or pneumonia.   Pleural spaces:  Unremarkable.  No pneumothorax.   Heart:  Unremarkable.  No cardiomegaly.   Mediastinum:  Tortuous aorta   Bones/joints:  Degenerative changes along the spine  No acute fracture.   Upper abdomen:  Elevated right hemidiaphragm     IMPRESSION:       Opacities in the  left base may represent atelectasis or pneumonia.  THIS DOCUMENT HAS BEEN ELECTRONICALLY SIGNED BY Paulita Fujita MD      Discharge Medications:   Current Discharge Medication List      START taking these  medications    Details   cefUROXime (CEFTIN) 500 mg tablet Take 1 Tab by mouth every twelve (12) hours for 5 days.  Qty: 10 Tab, Refills: 0         CONTINUE these medications which have NOT CHANGED    Details   metoprolol succinate (TOPROL XL) 100 mg tablet Take 100 mg by mouth daily. Indications: Hypertension      potassium chloride SR (KLOR-CON 10) 10 mEq tablet Take 10 mEq by mouth two (2) times a day.      aspirin delayed-release 81 mg tablet Take 81 mg by mouth daily.      furosemide (LASIX) 40 mg tablet Take 40 mg by mouth daily.      docusate sodium (COLACE) 100 mg capsule Take 100 mg by mouth two (2) times daily as needed for Constipation. Hold for loose stool.      nystatin (NYSTOP) powder Apply 1 Dose to affected area two (2) times a day. as needed for Left armpit rash.      OXYGEN-AIR DELIVERY SYSTEMS Take 3 L by inhalation nightly.      ergocalciferol (ERGOCALCIFEROL) 50,000 unit capsule Take 50,000 Units by mouth every seven (7) days.             Activity: Activity as tolerated  Diet: Cardiac Diet    Follow-up Information     Follow up With Details Comments Contact Info    Jabier Gauss, MD In 1 week  [address]  [place] [address]  [phone]      Hetty Ely, MD Go in 2 weeks  [address]    [employer] [place] [address]  [phone]            Jameila Keeny F. Natara Monfort, D.O   Electronic Signature  Hospitalist Attending  09/28/15

## 2015-09-28 NOTE — Progress Notes (Signed)
Recd pt awake, alert and oriented x 4 with clear speech and no c/o shortness of breath or pain.  Bypap in use, changed over to nasal cannula for meal.

## 2015-09-28 NOTE — ED Notes (Signed)
[facility] [id number]  EMERGENCY DEPT  [facility] [phone]  Re pt's lab results  Signed release information form was received  Record of pt's visits and tests results were faxed to [phone]

## 2015-09-28 NOTE — Progress Notes (Signed)
IV line removed from right neck by Dr Sue Lushinani with pressure drsng applied.  Pt with no signs or symptoms of bleeding.  Will continue to monitor.

## 2015-09-28 NOTE — Progress Notes (Signed)
Pt discharge instructions reviewed with patient.  Drsng to right neck dry and intact with no drainage present.  Drsng removed from lower right leg with no open area present.   Belongings with pt upon discharge.

## 2015-09-28 NOTE — Progress Notes (Signed)
While going over discharge meds with pt she states she does not take ergocalciferol, Dr Allen Derryanini made aware and  pt instructed to not take.

## 2015-09-28 NOTE — Progress Notes (Signed)
Pt to be discharged home today. [facility]. Also, [facility] informed that pt will be discharged home today. Spoke to pt's spouse, Sheri Fry, to inform of discharge. Pt has all needed DME at home. [facility]'s [facility]. [facility] for pick up between 6 and 6:30pm.

## 2015-09-28 NOTE — Other (Signed)
Bedside and Verbal shift change report given to Dreslyn, Dejong, RN (oncoming nurse) by Ken Milo, RN (offgoing nurse). Report included the following information SBAR, Kardex, Accordion, Recent Results and Med Rec Status.

## 2015-09-29 LAB — CULTURE, BLOOD: Culture result:: NO GROWTH

## 2015-09-30 ENCOUNTER — Encounter: Primary: Internal Medicine

## 2015-09-30 ENCOUNTER — Encounter: Admit: 2015-09-30 | Discharge: 2015-09-30 | Payer: BLUE CROSS/BLUE SHIELD | Primary: Internal Medicine

## 2015-10-01 LAB — CULTURE, BLOOD: Culture result:: NO GROWTH

## 2015-10-03 ENCOUNTER — Encounter: Admit: 2015-10-03 | Discharge: 2015-10-03 | Payer: BLUE CROSS/BLUE SHIELD | Primary: Internal Medicine

## 2015-10-04 ENCOUNTER — Encounter: Admit: 2015-10-04 | Discharge: 2015-10-04 | Payer: BLUE CROSS/BLUE SHIELD | Primary: Internal Medicine

## 2015-10-05 ENCOUNTER — Encounter: Admit: 2015-10-05 | Discharge: 2015-10-05 | Payer: BLUE CROSS/BLUE SHIELD | Primary: Internal Medicine

## 2015-10-05 ENCOUNTER — Encounter: Primary: Internal Medicine

## 2015-10-09 ENCOUNTER — Encounter: Primary: Internal Medicine

## 2015-10-10 ENCOUNTER — Encounter: Primary: Internal Medicine

## 2015-10-10 ENCOUNTER — Encounter: Admit: 2015-10-10 | Discharge: 2015-10-10 | Payer: BLUE CROSS/BLUE SHIELD | Primary: Internal Medicine

## 2015-10-11 ENCOUNTER — Encounter: Primary: Internal Medicine

## 2015-10-12 ENCOUNTER — Encounter: Admit: 2015-10-12 | Discharge: 2015-10-12 | Payer: BLUE CROSS/BLUE SHIELD | Primary: Internal Medicine

## 2015-10-16 ENCOUNTER — Encounter: Admit: 2015-10-16 | Discharge: 2015-10-16 | Payer: BLUE CROSS/BLUE SHIELD | Primary: Internal Medicine

## 2015-10-16 ENCOUNTER — Encounter: Primary: Internal Medicine

## 2015-10-17 ENCOUNTER — Encounter: Admit: 2015-10-17 | Discharge: 2015-10-17 | Payer: BLUE CROSS/BLUE SHIELD | Primary: Internal Medicine

## 2015-10-17 ENCOUNTER — Encounter: Primary: Internal Medicine

## 2015-10-18 ENCOUNTER — Encounter: Admit: 2015-10-18 | Discharge: 2015-10-18 | Payer: BLUE CROSS/BLUE SHIELD | Primary: Internal Medicine

## 2015-10-23 ENCOUNTER — Encounter: Primary: Internal Medicine

## 2015-10-24 ENCOUNTER — Encounter: Primary: Internal Medicine

## 2015-10-25 ENCOUNTER — Encounter: Admit: 2015-10-25 | Discharge: 2015-10-25 | Payer: BLUE CROSS/BLUE SHIELD | Primary: Internal Medicine

## 2015-10-25 ENCOUNTER — Encounter: Primary: Internal Medicine

## 2015-10-26 ENCOUNTER — Encounter: Admit: 2015-10-26 | Discharge: 2015-10-26 | Payer: BLUE CROSS/BLUE SHIELD | Primary: Internal Medicine

## 2015-10-27 ENCOUNTER — Encounter: Primary: Internal Medicine

## 2015-10-27 ENCOUNTER — Encounter: Admit: 2015-10-27 | Discharge: 2015-10-27 | Payer: BLUE CROSS/BLUE SHIELD | Primary: Internal Medicine

## 2015-10-30 ENCOUNTER — Encounter: Primary: Internal Medicine

## 2015-10-31 ENCOUNTER — Encounter: Primary: Internal Medicine

## 2015-11-01 ENCOUNTER — Encounter: Primary: Internal Medicine

## 2015-11-02 ENCOUNTER — Encounter: Primary: Internal Medicine

## 2015-11-03 ENCOUNTER — Encounter: Primary: Internal Medicine

## 2015-11-04 ENCOUNTER — Encounter: Primary: Internal Medicine

## 2015-11-06 ENCOUNTER — Encounter: Primary: Internal Medicine

## 2015-11-07 ENCOUNTER — Encounter: Admit: 2015-11-07 | Discharge: 2015-11-07 | Payer: BLUE CROSS/BLUE SHIELD | Primary: Internal Medicine

## 2015-11-07 ENCOUNTER — Encounter: Primary: Internal Medicine

## 2015-11-08 ENCOUNTER — Encounter: Primary: Internal Medicine

## 2015-11-08 ENCOUNTER — Encounter: Admit: 2015-11-08 | Discharge: 2015-11-08 | Payer: BLUE CROSS/BLUE SHIELD | Primary: Internal Medicine

## 2015-11-09 ENCOUNTER — Encounter: Primary: Internal Medicine

## 2015-11-13 ENCOUNTER — Encounter: Primary: Internal Medicine

## 2015-11-13 ENCOUNTER — Encounter: Admit: 2015-11-13 | Discharge: 2015-11-13 | Payer: BLUE CROSS/BLUE SHIELD | Primary: Internal Medicine

## 2015-11-14 ENCOUNTER — Encounter: Primary: Internal Medicine

## 2015-11-15 ENCOUNTER — Encounter: Primary: Internal Medicine

## 2015-11-15 ENCOUNTER — Encounter: Admit: 2015-11-15 | Discharge: 2015-11-15 | Payer: BLUE CROSS/BLUE SHIELD | Primary: Internal Medicine

## 2015-11-16 ENCOUNTER — Encounter: Admit: 2015-11-16 | Discharge: 2015-11-16 | Payer: BLUE CROSS/BLUE SHIELD | Primary: Internal Medicine

## 2015-11-16 ENCOUNTER — Encounter: Primary: Internal Medicine

## 2015-11-21 ENCOUNTER — Encounter: Primary: Internal Medicine

## 2015-11-23 ENCOUNTER — Encounter: Primary: Internal Medicine

## 2015-11-28 ENCOUNTER — Encounter: Primary: Internal Medicine

## 2015-11-30 ENCOUNTER — Encounter: Primary: Internal Medicine

## 2015-12-06 ENCOUNTER — Encounter: Admit: 2015-12-06 | Discharge: 2015-12-06 | Payer: BLUE CROSS/BLUE SHIELD | Primary: Internal Medicine

## 2015-12-07 ENCOUNTER — Encounter: Admit: 2015-12-07 | Discharge: 2015-12-07 | Payer: BLUE CROSS/BLUE SHIELD | Primary: Internal Medicine

## 2015-12-11 ENCOUNTER — Encounter: Primary: Internal Medicine

## 2015-12-14 ENCOUNTER — Encounter: Admit: 2015-12-14 | Discharge: 2015-12-14 | Payer: BLUE CROSS/BLUE SHIELD | Primary: Internal Medicine

## 2015-12-18 ENCOUNTER — Encounter: Admit: 2015-12-18 | Discharge: 2015-12-18 | Payer: BLUE CROSS/BLUE SHIELD | Primary: Internal Medicine

## 2015-12-21 ENCOUNTER — Encounter: Admit: 2015-12-21 | Discharge: 2015-12-21 | Payer: BLUE CROSS/BLUE SHIELD | Primary: Internal Medicine

## 2015-12-22 ENCOUNTER — Encounter: Primary: Internal Medicine

## 2015-12-28 ENCOUNTER — Encounter: Admit: 2015-12-28 | Discharge: 2015-12-28 | Payer: BLUE CROSS/BLUE SHIELD | Primary: Internal Medicine

## 2015-12-29 ENCOUNTER — Encounter: Admit: 2015-12-29 | Discharge: 2015-12-29 | Payer: BLUE CROSS/BLUE SHIELD | Primary: Internal Medicine

## 2016-01-04 ENCOUNTER — Encounter: Primary: Internal Medicine

## 2016-01-10 ENCOUNTER — Encounter: Primary: Internal Medicine

## 2016-01-11 ENCOUNTER — Encounter: Primary: Internal Medicine

## 2016-01-17 ENCOUNTER — Encounter: Admit: 2016-01-17 | Discharge: 2016-01-17 | Payer: BLUE CROSS/BLUE SHIELD | Primary: Internal Medicine

## 2016-01-17 ENCOUNTER — Encounter: Primary: Internal Medicine

## 2016-01-18 ENCOUNTER — Encounter: Primary: Internal Medicine

## 2016-02-06 ENCOUNTER — Encounter: Payer: BLUE CROSS/BLUE SHIELD | Primary: Internal Medicine

## 2016-02-06 ENCOUNTER — Encounter: Admit: 2016-02-06 | Discharge: 2016-02-06 | Payer: BLUE CROSS/BLUE SHIELD | Primary: Internal Medicine

## 2016-02-13 ENCOUNTER — Encounter: Admit: 2016-02-13 | Discharge: 2016-02-13 | Payer: BLUE CROSS/BLUE SHIELD | Primary: Internal Medicine

## 2016-02-13 ENCOUNTER — Inpatient Hospital Stay: Admit: 2016-02-13 | Payer: BLUE CROSS/BLUE SHIELD | Primary: Internal Medicine

## 2016-02-13 DIAGNOSIS — E7800 Pure hypercholesterolemia, unspecified: Secondary | ICD-10-CM

## 2016-02-13 LAB — LIPID PANEL
CHOL/HDL Ratio: 7.3
Cholesterol, total: 220 mg/dL — ABNORMAL HIGH (ref <200)
HDL Cholesterol: 30 mg/dL — ABNORMAL LOW (ref 40–60)
LDL, calculated: 154 MG/DL — ABNORMAL HIGH (ref 0–130)
Triglyceride: 180 mg/dL — ABNORMAL HIGH (ref <150)
VLDL, calculated: 36 MG/DL — ABNORMAL HIGH (ref 3–35)

## 2016-02-13 LAB — MAGNESIUM: Magnesium: 1.7 mg/dL — ABNORMAL LOW (ref 1.8–2.4)

## 2016-02-13 LAB — METABOLIC PANEL, COMPREHENSIVE
A-G Ratio: 0.7 — ABNORMAL LOW (ref 1.0–1.5)
ALT (SGPT): 38 U/L (ref 12–78)
AST (SGOT): 50 U/L — ABNORMAL HIGH (ref 15–37)
Albumin: 3.2 g/dL — ABNORMAL LOW (ref 3.4–5.0)
Alk. phosphatase: 72 U/L (ref 46–116)
Anion gap: 12 mmol/L (ref 8–20)
BUN: 24 mg/dL — ABNORMAL HIGH (ref 7–18)
Bilirubin, total: 0.8 mg/dL (ref 0.2–1.0)
CO2: 27 mmol/L (ref 21–32)
Calcium: 10.3 mg/dL — ABNORMAL HIGH (ref 8.5–10.1)
Chloride: 99 mmol/L (ref 98–107)
Creatinine: 1.05 mg/dL — ABNORMAL HIGH (ref 0.55–1.02)
GFR est AA: >60 mL/min/{1.73_m2} (ref >60)
GFR est non-AA: 56 mL/min/{1.73_m2} — ABNORMAL LOW (ref >60)
Globulin: 4.4 g/dL (ref 2.5–5.0)
Glucose: 201 mg/dL — ABNORMAL HIGH (ref 74–106)
Potassium: 3.3 mmol/L — ABNORMAL LOW (ref 3.5–5.1)
Protein, total: 7.6 g/dL (ref 6.4–8.2)
Sodium: 138 mmol/L (ref 136–145)

## 2016-02-13 LAB — URINALYSIS W/ RFLX MICROSCOPIC
Bilirubin: NEGATIVE
Glucose: NEGATIVE mg/dL
Ketone: NEGATIVE mg/dL
Nitrites: POSITIVE — AB
Specific gravity: 1.015 (ref 1.005–1.030)
Urobilinogen: 0.2 EU/dL (ref 0.1–1.0)
pH (UA): 6 (ref 4.5–8.0)

## 2016-02-13 LAB — URINE MICROSCOPIC

## 2016-02-13 LAB — URIC ACID: Uric acid: 8 mg/dL — ABNORMAL HIGH (ref 2.6–6.0)

## 2016-02-13 LAB — TSH 3RD GENERATION: TSH: 2.19 u[IU]/mL (ref 0.360–3.740)

## 2016-02-13 LAB — PROTEIN, CONFIRM

## 2016-02-14 LAB — VITAMIN D, 25 HYDROXY: Vitamin D 25-Hydroxy: 32.9 ng/mL

## 2016-02-15 ENCOUNTER — Encounter: Admit: 2016-02-15 | Discharge: 2016-02-15 | Payer: BLUE CROSS/BLUE SHIELD | Primary: Internal Medicine

## 2016-02-20 ENCOUNTER — Encounter: Admit: 2016-02-20 | Discharge: 2016-02-20 | Payer: BLUE CROSS/BLUE SHIELD | Primary: Internal Medicine

## 2016-02-22 ENCOUNTER — Encounter: Admit: 2016-02-22 | Discharge: 2016-02-22 | Payer: BLUE CROSS/BLUE SHIELD | Primary: Internal Medicine

## 2016-02-22 ENCOUNTER — Inpatient Hospital Stay: Admit: 2016-02-22 | Payer: BLUE CROSS/BLUE SHIELD | Primary: Internal Medicine

## 2016-02-22 DIAGNOSIS — E7800 Pure hypercholesterolemia, unspecified: Secondary | ICD-10-CM

## 2016-02-22 LAB — CBC WITH AUTOMATED DIFF
ABS. EOSINOPHILS: 0.8 10*3/uL
ABS. LYMPHOCYTES: 4.6 10*3/uL
ABS. MONOCYTES: 0.4 10*3/uL
ABS. NEUTROPHILS: 6.7 10*3/uL
EOSINOPHILS: 6 % — ABNORMAL HIGH (ref 0–2)
HCT: 44 % (ref 37.0–47.0)
HGB: 14.4 g/dL (ref 12.0–16.0)
LYMPHOCYTES: 37 % (ref 21–51)
MCH: 28 PG (ref 27.0–31.0)
MCHC: 32.7 g/dL (ref 30.5–36.0)
MCV: 85.6 FL (ref 81.0–100.0)
MONOCYTES: 3 % (ref 2–9)
MPV: 11.9 FL (ref 10.2–12.7)
NEUTROPHILS: 54 % (ref 42–75)
PLATELET ESTIMATE: ADEQUATE
PLATELET: 253 10*3/uL (ref 122–400)
RBC: 5.14 M/uL (ref 4.20–5.40)
RDW: 15.7 % — ABNORMAL HIGH (ref 11.4–14.6)
WBC: 12.5 10*3/uL — ABNORMAL HIGH (ref 4.8–10.8)

## 2016-02-28 ENCOUNTER — Encounter: Admit: 2016-02-28 | Discharge: 2016-02-28 | Payer: BLUE CROSS/BLUE SHIELD | Primary: Internal Medicine

## 2016-03-06 ENCOUNTER — Encounter: Primary: Internal Medicine

## 2016-03-07 ENCOUNTER — Encounter: Primary: Internal Medicine

## 2016-03-11 ENCOUNTER — Encounter: Admit: 2016-03-11 | Discharge: 2016-03-11 | Payer: BLUE CROSS/BLUE SHIELD | Primary: Internal Medicine

## 2016-03-13 ENCOUNTER — Emergency Department (HOSPITAL_COMMUNITY): Payer: BLUE CROSS/BLUE SHIELD

## 2016-03-13 ENCOUNTER — Encounter (HOSPITAL_COMMUNITY): Payer: Self-pay | Admitting: Emergency Medicine

## 2016-03-13 ENCOUNTER — Inpatient Hospital Stay (HOSPITAL_COMMUNITY)
Admission: EM | Admit: 2016-03-13 | Discharge: 2016-03-21 | DRG: 871 | Disposition: A | Discharge status: Other Acute Inpatient Hospital | Payer: BLUE CROSS/BLUE SHIELD | Attending: Internal Medicine | Admitting: Internal Medicine

## 2016-03-13 DIAGNOSIS — I10 Essential (primary) hypertension: Secondary | ICD-10-CM | POA: Diagnosis present

## 2016-03-13 DIAGNOSIS — Z883 Allergy status to other anti-infective agents status: Secondary | ICD-10-CM

## 2016-03-13 DIAGNOSIS — E872 Acidosis, unspecified: Secondary | ICD-10-CM | POA: Diagnosis present

## 2016-03-13 DIAGNOSIS — A4151 Sepsis due to Escherichia coli [E. coli]: Secondary | ICD-10-CM | POA: Diagnosis not present

## 2016-03-13 DIAGNOSIS — L89159 Pressure ulcer of sacral region, unspecified stage: Secondary | ICD-10-CM

## 2016-03-13 DIAGNOSIS — R6521 Severe sepsis with septic shock: Secondary | ICD-10-CM | POA: Diagnosis not present

## 2016-03-13 DIAGNOSIS — D649 Anemia, unspecified: Secondary | ICD-10-CM | POA: Diagnosis present

## 2016-03-13 DIAGNOSIS — E559 Vitamin D deficiency, unspecified: Secondary | ICD-10-CM | POA: Diagnosis present

## 2016-03-13 DIAGNOSIS — K5900 Constipation, unspecified: Secondary | ICD-10-CM | POA: Diagnosis not present

## 2016-03-13 DIAGNOSIS — K6289 Other specified diseases of anus and rectum: Secondary | ICD-10-CM | POA: Diagnosis present

## 2016-03-13 DIAGNOSIS — D509 Iron deficiency anemia, unspecified: Secondary | ICD-10-CM | POA: Diagnosis present

## 2016-03-13 DIAGNOSIS — R6520 Severe sepsis without septic shock: Secondary | ICD-10-CM | POA: Diagnosis present

## 2016-03-13 DIAGNOSIS — R7881 Bacteremia: Secondary | ICD-10-CM | POA: Diagnosis present

## 2016-03-13 DIAGNOSIS — A4189 Other specified sepsis: Secondary | ICD-10-CM | POA: Diagnosis present

## 2016-03-13 DIAGNOSIS — L8994 Pressure ulcer of unspecified site, stage 4: Secondary | ICD-10-CM

## 2016-03-13 DIAGNOSIS — B372 Candidiasis of skin and nail: Secondary | ICD-10-CM | POA: Diagnosis not present

## 2016-03-13 DIAGNOSIS — Z6841 Body Mass Index (BMI) 40.0 and over, adult: Secondary | ICD-10-CM

## 2016-03-13 DIAGNOSIS — N319 Neuromuscular dysfunction of bladder, unspecified: Secondary | ICD-10-CM | POA: Diagnosis present

## 2016-03-13 DIAGNOSIS — L89154 Pressure ulcer of sacral region, stage 4: Secondary | ICD-10-CM | POA: Diagnosis present

## 2016-03-13 DIAGNOSIS — Z79899 Other long term (current) drug therapy: Secondary | ICD-10-CM

## 2016-03-13 DIAGNOSIS — Z880 Allergy status to penicillin: Secondary | ICD-10-CM

## 2016-03-13 DIAGNOSIS — Z993 Dependence on wheelchair: Secondary | ICD-10-CM

## 2016-03-13 DIAGNOSIS — G4733 Obstructive sleep apnea (adult) (pediatric): Secondary | ICD-10-CM | POA: Diagnosis present

## 2016-03-13 DIAGNOSIS — R197 Diarrhea, unspecified: Secondary | ICD-10-CM | POA: Diagnosis present

## 2016-03-13 DIAGNOSIS — I5032 Chronic diastolic (congestive) heart failure: Secondary | ICD-10-CM | POA: Diagnosis present

## 2016-03-13 DIAGNOSIS — A419 Sepsis, unspecified organism: Secondary | ICD-10-CM

## 2016-03-13 DIAGNOSIS — E876 Hypokalemia: Secondary | ICD-10-CM | POA: Diagnosis present

## 2016-03-13 DIAGNOSIS — I110 Hypertensive heart disease with heart failure: Secondary | ICD-10-CM | POA: Diagnosis present

## 2016-03-13 DIAGNOSIS — E1165 Type 2 diabetes mellitus with hyperglycemia: Secondary | ICD-10-CM | POA: Diagnosis present

## 2016-03-13 DIAGNOSIS — G8220 Paraplegia, unspecified: Secondary | ICD-10-CM | POA: Diagnosis present

## 2016-03-13 DIAGNOSIS — I509 Heart failure, unspecified: Secondary | ICD-10-CM | POA: Diagnosis present

## 2016-03-13 DIAGNOSIS — E0781 Sick-euthyroid syndrome: Secondary | ICD-10-CM | POA: Diagnosis present

## 2016-03-13 DIAGNOSIS — L089 Local infection of the skin and subcutaneous tissue, unspecified: Secondary | ICD-10-CM | POA: Diagnosis present

## 2016-03-13 DIAGNOSIS — L308 Other specified dermatitis: Secondary | ICD-10-CM | POA: Diagnosis not present

## 2016-03-13 DIAGNOSIS — N179 Acute kidney failure, unspecified: Secondary | ICD-10-CM | POA: Diagnosis present

## 2016-03-13 DIAGNOSIS — I712 Thoracic aortic aneurysm, without rupture: Secondary | ICD-10-CM | POA: Diagnosis present

## 2016-03-13 DIAGNOSIS — L8990 Pressure ulcer of unspecified site, unspecified stage: Secondary | ICD-10-CM | POA: Diagnosis present

## 2016-03-13 DIAGNOSIS — Z96643 Presence of artificial hip joint, bilateral: Secondary | ICD-10-CM | POA: Diagnosis present

## 2016-03-13 DIAGNOSIS — M4628 Osteomyelitis of vertebra, sacral and sacrococcygeal region: Secondary | ICD-10-CM | POA: Diagnosis present

## 2016-03-13 DIAGNOSIS — A408 Other streptococcal sepsis: Secondary | ICD-10-CM | POA: Diagnosis present

## 2016-03-13 DIAGNOSIS — Z936 Other artificial openings of urinary tract status: Secondary | ICD-10-CM

## 2016-03-13 DIAGNOSIS — I700 Atherosclerosis of aorta: Secondary | ICD-10-CM | POA: Diagnosis present

## 2016-03-13 DIAGNOSIS — E871 Hypo-osmolality and hyponatremia: Secondary | ICD-10-CM | POA: Diagnosis present

## 2016-03-13 DIAGNOSIS — N390 Urinary tract infection, site not specified: Secondary | ICD-10-CM | POA: Diagnosis present

## 2016-03-13 DIAGNOSIS — E119 Type 2 diabetes mellitus without complications: Secondary | ICD-10-CM

## 2016-03-13 DIAGNOSIS — Z9981 Dependence on supplemental oxygen: Secondary | ICD-10-CM

## 2016-03-13 DIAGNOSIS — R531 Weakness: Secondary | ICD-10-CM | POA: Diagnosis not present

## 2016-03-13 DIAGNOSIS — M869 Osteomyelitis, unspecified: Secondary | ICD-10-CM

## 2016-03-13 DIAGNOSIS — Z7982 Long term (current) use of aspirin: Secondary | ICD-10-CM

## 2016-03-13 DIAGNOSIS — E860 Dehydration: Secondary | ICD-10-CM | POA: Diagnosis present

## 2016-03-13 DIAGNOSIS — E042 Nontoxic multinodular goiter: Secondary | ICD-10-CM | POA: Diagnosis present

## 2016-03-13 HISTORY — DX: Personal history of other diseases of the nervous system and sense organs: Z86.69

## 2016-03-13 HISTORY — DX: Essential (primary) hypertension: I10

## 2016-03-13 HISTORY — DX: Presence of other specified devices: Z97.8

## 2016-03-13 HISTORY — DX: Morbid (severe) obesity due to excess calories: E66.01

## 2016-03-13 HISTORY — DX: Heart failure, unspecified: I50.9

## 2016-03-13 HISTORY — DX: Spondylosis without myelopathy or radiculopathy, lumbar region: M47.816

## 2016-03-13 HISTORY — DX: Presence of urogenital implants: Z96.0

## 2016-03-13 LAB — URINE MICROSCOPIC-ADD ON

## 2016-03-13 LAB — COMPREHENSIVE METABOLIC PANEL
ALBUMIN: 3.2 g/dL — AB (ref 3.5–5.0)
ALK PHOS: 88 U/L (ref 38–126)
ALT: 21 U/L (ref 14–54)
AST: 39 U/L (ref 15–41)
Anion gap: 16 — ABNORMAL HIGH (ref 5–15)
BILIRUBIN TOTAL: 1.1 mg/dL (ref 0.3–1.2)
BUN: 22 mg/dL — AB (ref 6–20)
CALCIUM: 10.4 mg/dL — AB (ref 8.9–10.3)
CO2: 20 mmol/L — ABNORMAL LOW (ref 22–32)
CREATININE: 1.35 mg/dL — AB (ref 0.44–1.00)
Chloride: 94 mmol/L — ABNORMAL LOW (ref 101–111)
GFR calc Af Amer: 48 mL/min — ABNORMAL LOW (ref >60)
GFR, EST NON AFRICAN AMERICAN: 41 mL/min — AB (ref >60)
GLUCOSE: 285 mg/dL — AB (ref 65–99)
POTASSIUM: 2.9 mmol/L — AB (ref 3.5–5.1)
Sodium: 130 mmol/L — ABNORMAL LOW (ref 135–145)
TOTAL PROTEIN: 7.5 g/dL (ref 6.5–8.1)

## 2016-03-13 LAB — CBC WITH DIFFERENTIAL/PLATELET
BASOS ABS: 0 10*3/uL (ref 0.0–0.1)
Basophils Relative: 0 %
EOS ABS: 0 10*3/uL (ref 0.0–0.7)
EOS PCT: 0 %
HCT: 40.6 % (ref 36.0–46.0)
Hemoglobin: 13.3 g/dL (ref 12.0–15.0)
LYMPHS PCT: 9 %
Lymphs Abs: 2.1 10*3/uL (ref 0.7–4.0)
MCH: 27.1 pg (ref 26.0–34.0)
MCHC: 32.8 g/dL (ref 30.0–36.0)
MCV: 82.9 fL (ref 78.0–100.0)
MONO ABS: 0.2 10*3/uL (ref 0.1–1.0)
Monocytes Relative: 1 %
Neutro Abs: 21.1 10*3/uL — ABNORMAL HIGH (ref 1.7–7.7)
Neutrophils Relative %: 90 %
Platelets: 452 10*3/uL — ABNORMAL HIGH (ref 150–400)
RBC: 4.9 MIL/uL (ref 3.87–5.11)
RDW: 15 % (ref 11.5–15.5)
WBC: 23.5 10*3/uL — ABNORMAL HIGH (ref 4.0–10.5)

## 2016-03-13 LAB — TROPONIN I: Troponin I: 0.03 ng/mL (ref <0.031)

## 2016-03-13 LAB — I-STAT CG4 LACTIC ACID, ED: Lactic Acid, Venous: 6.38 mmol/L (ref 0.5–2.0)

## 2016-03-13 LAB — URINALYSIS, ROUTINE W REFLEX MICROSCOPIC
BILIRUBIN URINE: NEGATIVE
Glucose, UA: NEGATIVE mg/dL
Ketones, ur: NEGATIVE mg/dL
NITRITE: NEGATIVE
PH: 6 (ref 5.0–8.0)
PROTEIN: 100 mg/dL — AB
SPECIFIC GRAVITY, URINE: 1.011 (ref 1.005–1.030)

## 2016-03-13 LAB — BRAIN NATRIURETIC PEPTIDE: B Natriuretic Peptide: 48.6 pg/mL (ref 0.0–100.0)

## 2016-03-13 MED ORDER — IOPAMIDOL (ISOVUE-370) INJECTION 76%
100.0000 mL | Freq: Once | INTRAVENOUS | Status: AC | PRN
  Administered 2016-03-13: 80 mL via INTRAVENOUS

## 2016-03-13 MED ORDER — SODIUM CHLORIDE 0.9 % IV BOLUS (SEPSIS): 1000.0000 mL | Freq: Once | INTRAVENOUS | Status: DC

## 2016-03-13 MED ORDER — SODIUM CHLORIDE 0.9 % IV BOLUS (SEPSIS)
1000.0000 mL | Freq: Once | INTRAVENOUS | Status: DC
  Administered 2016-03-14: 1000 mL via INTRAVENOUS

## 2016-03-13 MED ORDER — SODIUM CHLORIDE 0.9 % IV BOLUS (SEPSIS)
1000.0000 mL | Freq: Once | INTRAVENOUS | Status: AC
  Administered 2016-03-13: 1000 mL via INTRAVENOUS

## 2016-03-13 MED ORDER — SODIUM CHLORIDE 0.9 % IV SOLN
1000.0000 mL | INTRAVENOUS | Status: DC
  Administered 2016-03-13 – 2016-03-16 (×5): 1000 mL via INTRAVENOUS

## 2016-03-13 MED ORDER — METRONIDAZOLE IN NACL 5-0.79 MG/ML-% IV SOLN
500.0000 mg | Freq: Once | INTRAVENOUS | Status: AC
  Administered 2016-03-14: 500 mg via INTRAVENOUS
  Filled 2016-03-13: qty 100

## 2016-03-13 MED ORDER — SODIUM CHLORIDE 0.9 % IV BOLUS (SEPSIS): 500.0000 mL | Freq: Once | INTRAVENOUS | Status: DC

## 2016-03-13 MED ORDER — DEXTROSE 5 % IV SOLN
2.0000 g | INTRAVENOUS | Status: AC
  Administered 2016-03-13: 2 g via INTRAVENOUS
  Filled 2016-03-13: qty 2

## 2016-03-13 NOTE — ED Notes (Signed)
PA at bedside.

## 2016-03-13 NOTE — ED Notes (Signed)
Bed: [id number] Expected date:  Expected time:  Means of arrival:  Comments: 65 yr old weakness

## 2016-03-13 NOTE — ED Provider Notes (Signed)
CSN: [id number]     Arrival date & time 03/13/16  2203 History  By signing my name below, I, [name], attest that this documentation has been prepared under the direction and in the presence of [name], PA-C. Electronically Signed: [name], ED Scribe. 03/13/2016. 10:32 PM.   Chief Complaint  Patient presents with  . Weakness   The history is provided by the patient. No language interpreter was used.  HPI Comments: Sharon Warner is a 65 y.o. Female with a hx of CHF, DM, and HTN brought in by EMS who presents to the Emergency Department complaining of gradually worsening generalized weakness onset 4 days days ago. She states, "I haven't felt like myself." Pt reports associated fatigue, decreased liquid and food intake, SOB starting today, and low grade fevers tmax 100 F that began a week ago. 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 She reports that the cause of her paralysis is unknown. Husband reports that pt also has a decubitus ulcer to the left buttocks due to an old mattress. This was first noticed one month ago and has not been evaluated by wound care. Pt uses oxygen at home during nighttime. She states that she has been taking all of her medications as directed. Pt states that she has been flying all day today from [place] to [place]. She denies hx of blood clots, abdominal pain, nausea, vomiting, diarrhea, headache, dysuria, hematuria, decreased urine output, lightheadedness, dizziness, chest pain or cough.  Past Medical History  Diagnosis Date  . CHF (congestive heart failure) ([place])   . Hypertension   . Diabetes mellitus without complication [facility])    Past Surgical History  Procedure Laterality Date  . Vaginal hysterectomy     No family history on file. Social History  Substance Use Topics  . Smoking status: Never Smoker   . Smokeless tobacco: None  . Alcohol  Use: No   OB History    No data available     Review of Systems  Constitutional: Positive for fever, appetite change and fatigue.  HENT: Negative for sore throat and trouble swallowing.   Eyes: Negative for visual disturbance.  Respiratory: Positive for shortness of breath. Negative for cough.   Cardiovascular: Negative for chest pain and palpitations.  Gastrointestinal: Negative for nausea, vomiting, abdominal pain and diarrhea.  Genitourinary: Negative for dysuria, hematuria and decreased urine volume.  Musculoskeletal: Negative for myalgias.  Skin: Positive for wound.  Neurological: Positive for weakness. Negative for dizziness, light-headedness and headaches.   Allergies  Levaquin and Penicillins  Home Medications   Prior to Admission medications   Medication Sig Start Date End Date Taking? Authorizing Provider  aspirin EC 81 MG tablet Take 81 mg by mouth daily.   Yes Historical Provider, MD  B Complex-C (B-COMPLEX WITH VITAMIN C) tablet Take 1 tablet by mouth daily.   Yes Historical Provider, MD  CRANBERRY PO Take 1 capsule by mouth daily.   Yes Historical Provider, MD  furosemide (LASIX) 40 MG tablet Take 40 mg by mouth daily.   Yes Historical Provider, MD  metoprolol succinate (TOPROL-XL) 100 MG 24 hr tablet Take 200 mg by mouth daily. Take with or immediately following a meal.   Yes Historical Provider, MD  potassium chloride (K-DUR) 10 MEQ tablet Take 10 mEq by mouth 2 (two) times daily.   Yes Historical Provider, MD  Vitamin D, Ergocalciferol, (DRISDOL) 50000 units CAPS  capsule Take 50,000 Units by mouth every 7 (seven) days.   Yes Historical Provider, MD  zinc sulfate 220 (50 Zn) MG capsule Take 220 mg by mouth daily.   Yes Historical Provider, MD   BP 104/69 mmHg  Pulse 107  Temp(Src) 103.6 F (39.8 C) (Rectal)  Resp 33  Ht 5\' 1"  (1.549 m)  Wt 158.759 kg  BMI 66.17 kg/m2  SpO2 100% Physical Exam  Constitutional: She appears well-developed and well-nourished. No  distress.  Morbidly obese  HENT:  Head: Normocephalic and atraumatic.  Mouth/Throat: Oropharynx is clear and moist.  Eyes: Conjunctivae and EOM are normal. Pupils are equal, round, and reactive to light. Right eye exhibits no discharge. Left eye exhibits no discharge.  Neck: Neck supple. No JVD present.  Cardiovascular: Regular rhythm, normal heart sounds and intact distal pulses.  Tachycardia present.  Exam reveals no gallop and no friction rub.   No murmur heard. Tachycardia HR 130; bilateral radial pulses intact; good capillary refill to distal fingertips  Pulmonary/Chest: Effort normal and breath sounds normal. No stridor. Tachypnea noted. No respiratory distress. She has no wheezes. She has no rhonchi. She has no rales.  Slight tachypnea with respirations around 26; no increased work up breathing; on 2L nasal cannula oxygen saturation 97%  Abdominal: Soft. There is no tenderness. There is no guarding.  Abdomen is soft and nontender to palpation.  Genitourinary:  Foley catheter in place with full bag of urine  Musculoskeletal: She exhibits no edema.  Lymphadenopathy:    She has no cervical adenopathy.  Neurological: She is alert. Coordination normal.  Skin: Skin is warm and dry. No rash noted. She is not diaphoretic. No erythema. No pallor.  Large stage 4 decubitus ulcer to her sacrum with foul smelling odor and discharge. Bone is visualized through ulcer.   Psychiatric: She has a normal mood and affect. Her behavior is normal.  Nursing note and vitals reviewed.   ED Course  Procedures (including critical care time) DIAGNOSTIC STUDIES: Oxygen Saturation is 97% on RA, normal by my interpretation.    COORDINATION OF CARE: 10:25 PM-Discussed treatment plan which includes labs and chest x-ray with pt at bedside and pt agreed to plan.   Sepsis - Repeat Assessment  Performed at:    00:20   Vitals     Blood pressure 114/61, pulse 106, temperature 103.6 F (39.8 C), temperature  source Rectal, resp. rate 37, height 5\' 1"  (1.549 m), weight 158.759 kg, SpO2 100 %.  Heart:     Tachycardic  Lungs:    CTA  Capillary Refill:   <2 sec  Peripheral Pulse:   Radial pulse palpable  Skin:     Normal Color   Labs Review Labs Reviewed  COMPREHENSIVE METABOLIC PANEL - Abnormal; Notable for the following:    Sodium 130 (*)    Potassium 2.9 (*)    Chloride 94 (*)    CO2 20 (*)    Glucose, Bld 285 (*)    BUN 22 (*)    Creatinine, Ser 1.35 (*)    Calcium 10.4 (*)    Albumin 3.2 (*)    GFR calc non Af Amer 41 (*)    GFR calc Af Amer 48 (*)    Anion gap 16 (*)    All other components within normal limits  CBC WITH DIFFERENTIAL/PLATELET - Abnormal; Notable for the following:    WBC 23.5 (*)    Platelets 452 (*)    Neutro Abs 21.1 (*)  All other components within normal limits  URINALYSIS, ROUTINE W REFLEX MICROSCOPIC (NOT AT [facility]) - Abnormal; Notable for the following:    APPearance TURBID (*)    Hgb urine dipstick MODERATE (*)    Protein, ur 100 (*)    Leukocytes, UA LARGE (*)    All other components within normal limits  URINE MICROSCOPIC-ADD ON - Abnormal; Notable for the following:    Squamous Epithelial / LPF 0-5 (*)    Bacteria, UA MANY (*)    All other components within normal limits  I-STAT CG4 LACTIC ACID, ED - Abnormal; Notable for the following:    Lactic Acid, Venous 6.38 (*)    All other components within normal limits  CULTURE, BLOOD (ROUTINE X 2)  CULTURE, BLOOD (ROUTINE X 2)  URINE CULTURE  AEROBIC CULTURE (SUPERFICIAL SPECIMEN)  TROPONIN I  BRAIN NATRIURETIC PEPTIDE  I-STAT CG4 LACTIC ACID, ED    Imaging Review Dg Chest 2 View  03/13/2016  CLINICAL DATA:  Initial evaluation for acute shortness of breath. History of CHF, hypertension, diabetes. EXAM: CHEST  2 VIEW COMPARISON:  None. FINDINGS: Accentuation of the cardiac silhouette related AP technique and shallow lung inflation. Mediastinal silhouette within normal limits. Lungs are  hypoinflated with elevation the right hemidiaphragm. Associated mild right basilar atelectasis. Lungs otherwise clear without focal infiltrate or pulmonary edema. No pleural effusion. No pneumothorax. No acute osseous abnormality. IMPRESSION: 1. Shallow lung inflation with elevation the right hemidiaphragm. Associated mild right basilar atelectasis. 2. No other active cardiopulmonary disease. Electronically Signed   By: Jeannine Boga M.D.   On: 03/13/2016 23:41   Dg Lumbar Spine 2-3 Views  03/13/2016  CLINICAL DATA:  Initial evaluation for decubitus ulcer. EXAM: LUMBAR SPINE - 2-3 VIEW COMPARISON:  None. FINDINGS: Five non rib-bearing lumbar type vertebral bodies. Vertebral body heights preserved. No acute fracture or subluxation. Moderate degenerative changes within the lower lumbar spine. Soft tissue ulceration posterior to the distal sacrum/coccyx with underlying osseous erosion. Large amount of retained stool within the rectal vault. Gaseous distension of the bowels proximally. Osteopenia. IMPRESSION: 1. Decubitus ulcer with underlying osseous erosion of the distal sacrum/coccyx, better evaluated on concomitant radiograph of the sacrum/coccyx. 2. No other acute abnormality about the lumbar spine. 3. Moderate to advanced degenerative spondylolysis within the lower lumbar spine. 4. Large amount of retained stool within the rectal vault with gaseous distension proximally. Electronically Signed   By: Jeannine Boga M.D.   On: 03/13/2016 23:39   Dg Sacrum/coccyx  03/13/2016  CLINICAL DATA:  Initial evaluation for decubitus ulcer. EXAM: SACRUM AND COCCYX - 2+ VIEW COMPARISON:  None. FINDINGS: Soft tissue irregularity with emphysema present posterior to the sacrum/ coccyx, consistent with decubitus ulcer. Underlying distal sacrum/coccyx appears to be eroded. No dissecting soft tissue emphysema. No radiopaque foreign body. Remainder the visualized pelvis intact. Fixation hardware about the proximal  femurs/hips. The left femoral proximal left femur appears subluxed superiorly relative to the left femoral neck. Osteopenia. Advanced degenerative changes within the visualized lower lumbar spine. IMPRESSION: 1. Soft tissue decubitus ulcer with underlying osseous erosion of the distal sacrum/coccyx. 2. Fixation hardware about the hips bilaterally. The proximal left femur appears subluxed superiorly relative to the left femoral neck. This is not well evaluated on this exam. Correlation with dedicated hip radiographs could be performed for further evaluation as clinically indicated. 3. Large amount of retained stool within the distal colon. 4. Osteopenia. Electronically Signed   By: Jeannine Boga M.D.   On: 03/13/2016 23:37  Ct Angio Chest Pe W/cm &/or Wo Cm  03/14/2016  CLINICAL DATA:  Low-grade fever for several days. EXAM: CT ANGIOGRAPHY CHEST WITH CONTRAST TECHNIQUE: Multidetector CT imaging of the chest was performed using the standard protocol during bolus administration of intravenous contrast. Multiplanar CT image reconstructions and MIPs were obtained to evaluate the vascular anatomy. CONTRAST:  80 mL Isovue 370 COMPARISON:  Chest radiography 03/13/2016 FINDINGS: Mediastinum/Lymph Nodes: Thyroid tissue is enlarged and heterogeneous. There are bilateral nodules, right-sided nodule measuring roughly 2.9 cm. Study has technical limitations due to breathing artifact. Limited evaluation of the pulmonary arteries beyond the main pulmonary arteries. However, there is no evidence to suggest a large or central pulmonary embolism. Main pulmonary artery is large measuring up to 4.1 cm. The mid ascending thoracic aorta is also large for size measuring up to 4.2 cm. No evidence for an aortic dissection. Great vessels are patent. Descending thoracic aorta measures 3.3 cm. No evidence for chest lymphadenopathy. No significant pericardial fluid. Lungs/Pleura: Trachea and mainstem bronchi are patent. No pleural  effusions. Volume loss in the right lower lobe. Patchy areas of ground-glass densities are suggestive for atelectasis. No large areas of consolidation or confluent airspace disease. Upper abdomen: Elevation of the right hemidiaphragm. Cannot exclude hepatic steatosis. Liver appears to be prominent for size. Entire liver is not visualized. Musculoskeletal: No acute bone abnormality. Review of the MIP images confirms the above findings. IMPRESSION: Limited evaluation for pulmonary emboli due to motion artifact and poor opacification of the pulmonary arteries. No evidence for a large central pulmonary embolism but poor characterization of the pulmonary arteries beyond the main pulmonary arteries. Enlarged main pulmonary artery could represent underlying pulmonary hypertension. Enlargement of the ascending thoracic aorta measuring up to 4.2 cm. Recommend annual imaging followup by CTA or MRA. This recommendation follows 2010 ACCF/AHA/AATS/ACR/ASA/SCA/SCAI/SIR/STS/SVM Guidelines for the Diagnosis and Management of Patients with Thoracic Aortic Disease. Circulation. 2010; 121: [id number] Scattered ground-glass densities in lungs likely related to atelectasis. Probable hepatomegaly. Thyroid nodules. Recommend further characterization with thyroid ultrasound. Electronically Signed   By: Markus Daft M.D.   On: 03/14/2016 00:30   I have personally reviewed and evaluated these images and lab results as part of my medical decision-making.   EKG Interpretation   Date/Time:  Wednesday March 13 2016 22:47:02 EDT Ventricular Rate:  122 PR Interval:  137 QRS Duration: 107 QT Interval:  330 QTC Calculation: 470 R Axis:   -75 Text Interpretation:  Sinus tachycardia Incomplete RBBB and LAFB Abnormal  R-wave progression, late transition ST elevation, consider inferior injury  Confirmed by [facility] MD, JULIE ([id number]) on 03/13/2016 10:54:58 PM      Filed Vitals:   03/14/16 0011 03/14/16 0012 03/14/16 0030 03/14/16 0104   BP:  114/61 113/70 104/69  Pulse: 106  106 107  Temp:      TempSrc:      Resp: 34 37 32 33  Height:      Weight:      SpO2: 100%  100% 100%     MDM   Meds given in ED:  Medications  0.9 %  sodium chloride infusion (1,000 mLs Intravenous New Bag/Given 03/13/16 2255)  sodium chloride 0.9 % bolus 1,000 mL (not administered)    And  sodium chloride 0.9 % bolus 1,000 mL (not administered)    And  sodium chloride 0.9 % bolus 1,000 mL (not administered)    And  sodium chloride 0.9 % bolus 1,000 mL (not administered)    And  sodium chloride  0.9 % bolus 500 mL (not administered)  sodium chloride 0.9 % bolus 1,000 mL (1,000 mLs Intravenous New Bag/Given 03/13/16 2255)  aztreonam (AZACTAM) 2 g in dextrose 5 % 50 mL IVPB (0 g Intravenous Stopped 03/14/16 0013)  metroNIDAZOLE (FLAGYL) IVPB 500 mg (500 mg Intravenous New Bag/Given 03/14/16 0012)  iopamidol (ISOVUE-370) 76 % injection 100 mL (80 mLs Intravenous Contrast Given 03/13/16 2350)    New Prescriptions   No medications on file    Final diagnoses:  Decubital ulcer  Severe sepsis ([place])  Stage 4 decubitus ulcer ([place])   This is a 65 y.o. Female with a hx of CHF, DM, and HTN brought in by EMS who presents to the Emergency Department complaining of gradually worsening generalized weakness onset 4 days days ago. She states, "I haven't felt like myself." Pt reports associated fatigue, decreased liquid and food intake, SOB starting today, and low grade fevers tmax 100 F that began a week ago. 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 She reports that the cause of her paralysis is unknown. Husband reports that pt also has a decubitus ulcer to the left buttocks due to an old mattress. This was first noticed one month ago and has not been evaluated by wound care. Pt uses oxygen at home during nighttime. She states that she has been taking all of  her medications as directed. Pt states that she has been flying all day today from [place] to [place]. On exam patient's lungs are clear to auscultation bilaterally. Her abdomen is soft and nontender. She has a large stage IV decubitus ulcer to her sacrum with foul discharge and bone visualized. She is tachycardic with a heart rate of 1:30. She has some tachypnea. She is requiring 2 L via nasal cannula and has an oxygen saturation of 100%.  Code sepsis was activated at time of evaluation. Lactic acid is 6.38. Patient needs Cartia for severe sepsis. Full fluid bolus ordered. 2 IVs were ordered. CBC reveals leukocytosis with a white count of 23,000. CMP is normal for a potassium of 2.9 and a creatinine of 1.35. She has a gap of 16. EKG shows no STEMI. Troponin is not elevated. BNP is not elevated. Urinalysis is nitrite negative with large leukocytes and too numerous to count white blood cells. Urine sent for culture. Wound culture sent for ulcer. Blood cultures sent. Patient started on Azactam and Flagyl due to multiple drug allergies.  Chest x-ray shows shallow lung inflation with elevation of the right hemidiaphragm. Associated mild right basilar atelectasis. No other active acute cardio pulmonary disease. X-rays of her lumbar spine and sacrum reveal a decubitus ulcer with underlying osseous erosion of the distal sacrum and coccyx. CT angiogram chest was obtained in this patient is a patient was tachycardic, recent travel, is immobilized and complains of shortness of breath. The CTA was degraded due to motion artifact. There is no evidence of PE. Reevaluation patient reports she is feeling much better. She is less tachycardic. Plan for admission. Family is in agreement with admission.  Records were requested from her hospital in [place] and should arrive soon.   I consulted with Dr. Eulas Post who accepted the patient for admission. She requested temporary admission orders for stepdown bed. She requested  consultation to critical care medicine.  I consulted with critical care medicine physician Dr. Ashby Dawes who will see the patient in consult.   This patient was  discussed with and evaluated by Dr. Gilford Raid who agrees with assessment and plan.     CRITICAL CARE Performed by: Hanley Hays   Total critical care time: 50 minutes  Critical care time was exclusive of separately billable procedures and treating other patients.  Critical care was necessary to treat or prevent imminent or life-threatening deterioration.  Critical care was time spent personally by me on the following activities: development of treatment plan with patient and/or surrogate as well as nursing, discussions with consultants, evaluation of patient's response to treatment, examination of patient, obtaining history from patient or surrogate, ordering and performing treatments and interventions, ordering and review of laboratory studies, ordering and review of radiographic studies, pulse oximetry and re-evaluation of patient's condition.   I personally performed the services described in this documentation, which was scribed in my presence. The recorded information has been reviewed and is accurate.       Waynetta Pean, PA-C 03/14/16 0126  Isla Pence, MD 03/14/16 [phone]

## 2016-03-13 NOTE — ED Notes (Signed)
Critical lactic acid value given to MD, PA, and RN

## 2016-03-13 NOTE — ED Notes (Addendum)
Pt BIB EMS from home; pt has had low grade fever for several days; yesterday pt began feeling more tired than normal; pt has been traveling all day today; pt has indwelling catheter due to paralysis; pt has decubitus ulcer as well; A&Ox4; pt c/o SOB as well; hx of CHF and HTN.

## 2016-03-13 NOTE — Progress Notes (Addendum)
Pharmacy Antibiotic Note  Sharon Warner is a 65 y.o. female admitted on 03/13/2016 with wound infection.  Pharmacy has been consulted for azactam/flagyl/vancomycin dosing.  Plan: Azactam 2Gm IV q8h Flagyl 500mg  IV q8h Vancomycin 2500mg  x1 then 1250mg  IV q24h ( VT=15-20 mg/L code sepsis called/infected stage 4 sacral decuitus)     Temp (24hrs), Avg:100.9 F (38.3 C), Min:98.2 F (36.8 C), Max:103.6 F (39.8 C)   Recent Labs Lab 03/13/16 2247 03/13/16 2257  WBC 23.5*  --   LATICACIDVEN  --  6.38*    CrCl cannot be calculated (Unknown ideal weight.).    Allergies  Allergen Reactions  . Levaquin [Levofloxacin In D5w] Other (See Comments)    Unknown childhood allergy  . Penicillins Other (See Comments)    Unknown childhood allergy    Antimicrobials this admission:  6/14 azactam >>  6/14 flagyl >>  6/15 vancomycin >> Dose adjustments this admission:   Microbiology results:  BCx:   UCx:    Sputum:    MRSA PCR:   Thank you for allowing pharmacy to be a part of this patient's care.  Dorrene German 03/13/2016 11:19 PM

## 2016-03-14 ENCOUNTER — Encounter (HOSPITAL_COMMUNITY): Payer: Self-pay | Admitting: Internal Medicine

## 2016-03-14 ENCOUNTER — Inpatient Hospital Stay (HOSPITAL_COMMUNITY): Payer: BLUE CROSS/BLUE SHIELD

## 2016-03-14 DIAGNOSIS — L8990 Pressure ulcer of unspecified site, unspecified stage: Secondary | ICD-10-CM | POA: Diagnosis not present

## 2016-03-14 DIAGNOSIS — L8994 Pressure ulcer of unspecified site, stage 4: Secondary | ICD-10-CM

## 2016-03-14 DIAGNOSIS — R7881 Bacteremia: Secondary | ICD-10-CM | POA: Diagnosis not present

## 2016-03-14 DIAGNOSIS — E872 Acidosis, unspecified: Secondary | ICD-10-CM | POA: Diagnosis present

## 2016-03-14 DIAGNOSIS — N319 Neuromuscular dysfunction of bladder, unspecified: Secondary | ICD-10-CM

## 2016-03-14 DIAGNOSIS — M8610 Other acute osteomyelitis, unspecified site: Secondary | ICD-10-CM

## 2016-03-14 DIAGNOSIS — I110 Hypertensive heart disease with heart failure: Secondary | ICD-10-CM | POA: Diagnosis present

## 2016-03-14 DIAGNOSIS — E876 Hypokalemia: Secondary | ICD-10-CM | POA: Diagnosis not present

## 2016-03-14 DIAGNOSIS — I10 Essential (primary) hypertension: Secondary | ICD-10-CM | POA: Diagnosis not present

## 2016-03-14 DIAGNOSIS — B372 Candidiasis of skin and nail: Secondary | ICD-10-CM | POA: Diagnosis not present

## 2016-03-14 DIAGNOSIS — A4151 Sepsis due to Escherichia coli [E. coli]: Secondary | ICD-10-CM | POA: Diagnosis present

## 2016-03-14 DIAGNOSIS — L89154 Pressure ulcer of sacral region, stage 4: Secondary | ICD-10-CM | POA: Diagnosis not present

## 2016-03-14 DIAGNOSIS — Z6841 Body Mass Index (BMI) 40.0 and over, adult: Secondary | ICD-10-CM | POA: Diagnosis not present

## 2016-03-14 DIAGNOSIS — Z960 Presence of urogenital implants: Secondary | ICD-10-CM

## 2016-03-14 DIAGNOSIS — A4189 Other specified sepsis: Secondary | ICD-10-CM | POA: Diagnosis present

## 2016-03-14 DIAGNOSIS — A419 Sepsis, unspecified organism: Secondary | ICD-10-CM | POA: Diagnosis not present

## 2016-03-14 DIAGNOSIS — E1165 Type 2 diabetes mellitus with hyperglycemia: Secondary | ICD-10-CM | POA: Diagnosis present

## 2016-03-14 DIAGNOSIS — E871 Hypo-osmolality and hyponatremia: Secondary | ICD-10-CM | POA: Diagnosis present

## 2016-03-14 DIAGNOSIS — Z7401 Bed confinement status: Secondary | ICD-10-CM

## 2016-03-14 DIAGNOSIS — N179 Acute kidney failure, unspecified: Secondary | ICD-10-CM | POA: Diagnosis present

## 2016-03-14 DIAGNOSIS — R531 Weakness: Secondary | ICD-10-CM | POA: Diagnosis present

## 2016-03-14 DIAGNOSIS — E042 Nontoxic multinodular goiter: Secondary | ICD-10-CM | POA: Diagnosis present

## 2016-03-14 DIAGNOSIS — G8220 Paraplegia, unspecified: Secondary | ICD-10-CM | POA: Diagnosis not present

## 2016-03-14 DIAGNOSIS — A408 Other streptococcal sepsis: Secondary | ICD-10-CM | POA: Diagnosis present

## 2016-03-14 DIAGNOSIS — B9689 Other specified bacterial agents as the cause of diseases classified elsewhere: Secondary | ICD-10-CM | POA: Diagnosis not present

## 2016-03-14 DIAGNOSIS — I700 Atherosclerosis of aorta: Secondary | ICD-10-CM | POA: Diagnosis present

## 2016-03-14 DIAGNOSIS — E559 Vitamin D deficiency, unspecified: Secondary | ICD-10-CM | POA: Diagnosis present

## 2016-03-14 DIAGNOSIS — I5032 Chronic diastolic (congestive) heart failure: Secondary | ICD-10-CM | POA: Diagnosis present

## 2016-03-14 DIAGNOSIS — I7789 Other specified disorders of arteries and arterioles: Secondary | ICD-10-CM | POA: Diagnosis not present

## 2016-03-14 DIAGNOSIS — N3000 Acute cystitis without hematuria: Secondary | ICD-10-CM

## 2016-03-14 DIAGNOSIS — L89153 Pressure ulcer of sacral region, stage 3: Secondary | ICD-10-CM | POA: Diagnosis not present

## 2016-03-14 DIAGNOSIS — G4733 Obstructive sleep apnea (adult) (pediatric): Secondary | ICD-10-CM

## 2016-03-14 DIAGNOSIS — R197 Diarrhea, unspecified: Secondary | ICD-10-CM | POA: Diagnosis not present

## 2016-03-14 DIAGNOSIS — R6521 Severe sepsis with septic shock: Secondary | ICD-10-CM | POA: Diagnosis not present

## 2016-03-14 DIAGNOSIS — M4628 Osteomyelitis of vertebra, sacral and sacrococcygeal region: Secondary | ICD-10-CM | POA: Diagnosis not present

## 2016-03-14 DIAGNOSIS — I509 Heart failure, unspecified: Secondary | ICD-10-CM | POA: Diagnosis present

## 2016-03-14 DIAGNOSIS — D509 Iron deficiency anemia, unspecified: Secondary | ICD-10-CM | POA: Diagnosis present

## 2016-03-14 DIAGNOSIS — N390 Urinary tract infection, site not specified: Secondary | ICD-10-CM | POA: Diagnosis present

## 2016-03-14 DIAGNOSIS — B954 Other streptococcus as the cause of diseases classified elsewhere: Secondary | ICD-10-CM | POA: Diagnosis not present

## 2016-03-14 DIAGNOSIS — L89159 Pressure ulcer of sacral region, unspecified stage: Secondary | ICD-10-CM | POA: Diagnosis not present

## 2016-03-14 DIAGNOSIS — B9620 Unspecified Escherichia coli [E. coli] as the cause of diseases classified elsewhere: Secondary | ICD-10-CM | POA: Diagnosis not present

## 2016-03-14 DIAGNOSIS — Z993 Dependence on wheelchair: Secondary | ICD-10-CM

## 2016-03-14 DIAGNOSIS — L089 Local infection of the skin and subcutaneous tissue, unspecified: Secondary | ICD-10-CM | POA: Diagnosis present

## 2016-03-14 DIAGNOSIS — R6520 Severe sepsis without septic shock: Secondary | ICD-10-CM

## 2016-03-14 DIAGNOSIS — E119 Type 2 diabetes mellitus without complications: Secondary | ICD-10-CM

## 2016-03-14 HISTORY — DX: Other specified disorders of arteries and arterioles: I77.89

## 2016-03-14 LAB — CBC
HEMATOCRIT: 37.1 % (ref 36.0–46.0)
HEMATOCRIT: 35.5 % — AB (ref 36.0–46.0)
HEMOGLOBIN: 11.9 g/dL — AB (ref 12.0–15.0)
HEMOGLOBIN: 11.5 g/dL — AB (ref 12.0–15.0)
MCH: 27 pg (ref 26.0–34.0)
MCH: 27 pg (ref 26.0–34.0)
MCHC: 32.1 g/dL (ref 30.0–36.0)
MCHC: 32.4 g/dL (ref 30.0–36.0)
MCV: 84.1 fL (ref 78.0–100.0)
MCV: 83.3 fL (ref 78.0–100.0)
Platelets: 366 10*3/uL (ref 150–400)
Platelets: 341 10*3/uL (ref 150–400)
RBC: 4.41 MIL/uL (ref 3.87–5.11)
RBC: 4.26 MIL/uL (ref 3.87–5.11)
RDW: 15 % (ref 11.5–15.5)
RDW: 15.3 % (ref 11.5–15.5)
WBC: 29.9 10*3/uL — ABNORMAL HIGH (ref 4.0–10.5)
WBC: 33 10*3/uL — ABNORMAL HIGH (ref 4.0–10.5)

## 2016-03-14 LAB — LACTIC ACID, PLASMA
LACTIC ACID, VENOUS: 3.9 mmol/L — AB (ref 0.5–2.0)
LACTIC ACID, VENOUS: 3.5 mmol/L — AB (ref 0.5–2.0)
LACTIC ACID, VENOUS: 3.1 mmol/L — AB (ref 0.5–2.0)

## 2016-03-14 LAB — BASIC METABOLIC PANEL
Anion gap: 12 (ref 5–15)
BUN: 23 mg/dL — AB (ref 6–20)
CHLORIDE: 97 mmol/L — AB (ref 101–111)
CO2: 20 mmol/L — AB (ref 22–32)
CREATININE: 1.34 mg/dL — AB (ref 0.44–1.00)
Calcium: 9.4 mg/dL (ref 8.9–10.3)
GFR calc Af Amer: 48 mL/min — ABNORMAL LOW (ref >60)
GFR calc non Af Amer: 41 mL/min — ABNORMAL LOW (ref >60)
Glucose, Bld: 324 mg/dL — ABNORMAL HIGH (ref 65–99)
POTASSIUM: 2.9 mmol/L — AB (ref 3.5–5.1)
SODIUM: 129 mmol/L — AB (ref 135–145)

## 2016-03-14 LAB — MRSA PCR SCREENING: MRSA BY PCR: NEGATIVE

## 2016-03-14 LAB — GLUCOSE, CAPILLARY
Glucose-Capillary: 278 mg/dL — ABNORMAL HIGH (ref 65–99)
Glucose-Capillary: 222 mg/dL — ABNORMAL HIGH (ref 65–99)
Glucose-Capillary: 166 mg/dL — ABNORMAL HIGH (ref 65–99)
Glucose-Capillary: 183 mg/dL — ABNORMAL HIGH (ref 65–99)

## 2016-03-14 LAB — POTASSIUM: POTASSIUM: 3.9 mmol/L (ref 3.5–5.1)

## 2016-03-14 LAB — PROTIME-INR
INR: 1.53 — ABNORMAL HIGH (ref 0.00–1.49)
PROTHROMBIN TIME: 17.9 s — AB (ref 11.6–15.2)

## 2016-03-14 LAB — PROCALCITONIN: Procalcitonin: 41.36 ng/mL

## 2016-03-14 LAB — APTT: APTT: 35 s (ref 24–37)

## 2016-03-14 LAB — CREATININE, SERUM
Creatinine, Ser: 1.43 mg/dL — ABNORMAL HIGH (ref 0.44–1.00)
GFR calc Af Amer: 44 mL/min — ABNORMAL LOW (ref >60)
GFR calc non Af Amer: 38 mL/min — ABNORMAL LOW (ref >60)

## 2016-03-14 LAB — T4, FREE: FREE T4: 1.23 ng/dL — AB (ref 0.61–1.12)

## 2016-03-14 LAB — TSH: TSH: 1.785 u[IU]/mL (ref 0.350–4.500)

## 2016-03-14 MED ORDER — ASPIRIN EC 81 MG PO TBEC
81.0000 mg | DELAYED_RELEASE_TABLET | Freq: Every day | ORAL | Status: DC
  Administered 2016-03-14 – 2016-03-21 (×8): 81 mg via ORAL
  Filled 2016-03-14 (×8): qty 1

## 2016-03-14 MED ORDER — ENOXAPARIN SODIUM 40 MG/0.4ML ~~LOC~~ SOLN: 40.0000 mg | SUBCUTANEOUS | Status: DC

## 2016-03-14 MED ORDER — COLLAGENASE 250 UNIT/GM EX OINT
TOPICAL_OINTMENT | Freq: Every day | CUTANEOUS | Status: DC
  Administered 2016-03-14: 10:00:00 via TOPICAL
  Administered 2016-03-15: 1 via TOPICAL
  Administered 2016-03-16: 11:00:00 via TOPICAL
  Administered 2016-03-17: 1 via TOPICAL
  Administered 2016-03-18 – 2016-03-21 (×4): via TOPICAL
  Filled 2016-03-14: qty 30

## 2016-03-14 MED ORDER — ACETAMINOPHEN 650 MG RE SUPP: 650.0000 mg | Freq: Four times a day (QID) | RECTAL | Status: DC | PRN

## 2016-03-14 MED ORDER — POTASSIUM CHLORIDE CRYS ER 20 MEQ PO TBCR
40.0000 meq | EXTENDED_RELEASE_TABLET | ORAL | Status: AC
  Administered 2016-03-14 (×3): 40 meq via ORAL
  Filled 2016-03-14 (×3): qty 2

## 2016-03-14 MED ORDER — ONDANSETRON HCL 4 MG PO TABS: 4.0000 mg | ORAL_TABLET | Freq: Four times a day (QID) | ORAL | Status: DC | PRN

## 2016-03-14 MED ORDER — POLYETHYLENE GLYCOL 3350 17 G PO PACK
17.0000 g | PACK | Freq: Every day | ORAL | Status: DC
  Administered 2016-03-14: 17 g via ORAL
  Filled 2016-03-14 (×4): qty 1

## 2016-03-14 MED ORDER — DEXTROSE 5 % IV SOLN
2.0000 g | Freq: Three times a day (TID) | INTRAVENOUS | Status: DC
  Administered 2016-03-14 – 2016-03-15 (×4): 2 g via INTRAVENOUS
  Filled 2016-03-14 (×5): qty 2

## 2016-03-14 MED ORDER — ONDANSETRON HCL 4 MG/2ML IJ SOLN: 4.0000 mg | Freq: Four times a day (QID) | INTRAMUSCULAR | Status: DC | PRN

## 2016-03-14 MED ORDER — BISACODYL 10 MG RE SUPP: 10.0000 mg | Freq: Every day | RECTAL | Status: DC | PRN

## 2016-03-14 MED ORDER — ENSURE ENLIVE PO LIQD
237.0000 mL | Freq: Three times a day (TID) | ORAL | Status: DC
  Administered 2016-03-14 – 2016-03-16 (×5): 237 mL via ORAL

## 2016-03-14 MED ORDER — VANCOMYCIN HCL 10 G IV SOLR
1250.0000 mg | INTRAVENOUS | Status: DC
  Administered 2016-03-15: 1250 mg via INTRAVENOUS
  Filled 2016-03-14: qty 1250

## 2016-03-14 MED ORDER — METRONIDAZOLE IN NACL 5-0.79 MG/ML-% IV SOLN
500.0000 mg | Freq: Three times a day (TID) | INTRAVENOUS | Status: DC
  Administered 2016-03-14 – 2016-03-15 (×4): 500 mg via INTRAVENOUS
  Filled 2016-03-14 (×4): qty 100

## 2016-03-14 MED ORDER — INSULIN ASPART 100 UNIT/ML ~~LOC~~ SOLN
0.0000 [IU] | Freq: Three times a day (TID) | SUBCUTANEOUS | Status: DC
  Administered 2016-03-14: 4 [IU] via SUBCUTANEOUS
  Administered 2016-03-14: 11 [IU] via SUBCUTANEOUS
  Administered 2016-03-14: 7 [IU] via SUBCUTANEOUS
  Administered 2016-03-15: 4 [IU] via SUBCUTANEOUS
  Administered 2016-03-15 – 2016-03-16 (×3): 7 [IU] via SUBCUTANEOUS
  Administered 2016-03-16 – 2016-03-17 (×4): 4 [IU] via SUBCUTANEOUS
  Administered 2016-03-17 – 2016-03-18 (×3): 3 [IU] via SUBCUTANEOUS
  Administered 2016-03-18: 4 [IU] via SUBCUTANEOUS
  Administered 2016-03-19 (×3): 3 [IU] via SUBCUTANEOUS
  Administered 2016-03-20 (×2): 4 [IU] via SUBCUTANEOUS
  Administered 2016-03-20: 3 [IU] via SUBCUTANEOUS
  Administered 2016-03-21 (×2): 4 [IU] via SUBCUTANEOUS

## 2016-03-14 MED ORDER — ACETAMINOPHEN 325 MG PO TABS: 650.0000 mg | ORAL_TABLET | Freq: Four times a day (QID) | ORAL | Status: DC | PRN

## 2016-03-14 MED ORDER — VANCOMYCIN HCL 10 G IV SOLR
2500.0000 mg | Freq: Once | INTRAVENOUS | Status: AC
  Administered 2016-03-14: 2500 mg via INTRAVENOUS
  Filled 2016-03-14: qty 2500

## 2016-03-14 MED ORDER — ENOXAPARIN SODIUM 60 MG/0.6ML ~~LOC~~ SOLN: 0.5000 mg/kg | Freq: Every day | SUBCUTANEOUS | Status: DC

## 2016-03-14 MED ORDER — INSULIN GLARGINE 100 UNIT/ML ~~LOC~~ SOLN
8.0000 [IU] | Freq: Every day | SUBCUTANEOUS | Status: DC
  Administered 2016-03-14 – 2016-03-15 (×2): 8 [IU] via SUBCUTANEOUS
  Filled 2016-03-14 (×3): qty 0.08

## 2016-03-14 MED ORDER — ADULT MULTIVITAMIN W/MINERALS CH
1.0000 | ORAL_TABLET | Freq: Every day | ORAL | Status: DC
  Administered 2016-03-15 – 2016-03-21 (×7): 1 via ORAL
  Filled 2016-03-14 (×7): qty 1

## 2016-03-14 MED ORDER — PRO-STAT SUGAR FREE PO LIQD
30.0000 mL | Freq: Every day | ORAL | Status: DC
  Administered 2016-03-14 – 2016-03-21 (×6): 30 mL via ORAL
  Filled 2016-03-14 (×7): qty 30

## 2016-03-14 MED ORDER — SENNOSIDES-DOCUSATE SODIUM 8.6-50 MG PO TABS
1.0000 | ORAL_TABLET | Freq: Two times a day (BID) | ORAL | Status: DC
  Administered 2016-03-14: 1 via ORAL
  Filled 2016-03-14: qty 1

## 2016-03-14 MED ORDER — ENOXAPARIN SODIUM 60 MG/0.6ML ~~LOC~~ SOLN
0.5000 mg/kg | SUBCUTANEOUS | Status: DC
  Administered 2016-03-14 – 2016-03-19 (×6): 55 mg via SUBCUTANEOUS
  Filled 2016-03-14 (×7): qty 0.6

## 2016-03-14 MED ORDER — INSULIN ASPART 100 UNIT/ML ~~LOC~~ SOLN
0.0000 [IU] | Freq: Every day | SUBCUTANEOUS | Status: DC
  Administered 2016-03-16: 2 [IU] via SUBCUTANEOUS

## 2016-03-14 MED ORDER — ENOXAPARIN SODIUM 80 MG/0.8ML ~~LOC~~ SOLN
80.0000 mg | Freq: Every day | SUBCUTANEOUS | Status: DC
Start: 2016-03-14 — End: 2016-03-14
  Filled 2016-03-14 (×2): qty 0.8

## 2016-03-14 NOTE — Progress Notes (Signed)
On admission pt stated that she is Sharon Warner, does not want any blood transfusions.  Macario Golds

## 2016-03-14 NOTE — ED Notes (Signed)
Pt returned from CT °

## 2016-03-14 NOTE — Progress Notes (Signed)
Initial Nutrition Assessment  DOCUMENTATION CODES:   Morbid obesity  INTERVENTION:  - Will order Ensure Enlive TID, each supplement provides 350 kcal and 20 grams of protein - Will order 30 mL Prostat once/day, this supplement provides 100 kcal and 15 grams of protein. - Will order daily multivitamin with minerals. - Encourage intake of supplements and protein foods. - RD will continue to monitor for needs, need for education prior to d/c.  NUTRITION DIAGNOSIS:   Increased nutrient needs related to wound healing as evidenced by estimated needs.  GOAL:   Patient will meet greater than or equal to 90% of their needs  MONITOR:   PO intake, Supplement acceptance, Weight trends, Labs, Skin, I & O's  REASON FOR ASSESSMENT:   Malnutrition Screening Tool  ASSESSMENT:   65 y.o. woman with a history of paraplegia x 4 years (etiology unclear, wheelchair bound, chronic indwelling foley catheter), morbid obesity, HTN, DM (diet controlled), and vitamin D deficiency who relocated to this area today with her husband. She apparently came straight to this ED via ambulance shortly after landing. She complains of increased fatigue and recurrent fevers. She also knew that she needed to have her sacral wound evaluated. She has had nausea but no vomiting. She explicitly denies chest pain or shortness of breath. No abdominal pain. No diarrhea. No headaches. No syncope/LOC. Prior to her move, she received home health care for her sacral wound.  Pt seen for MST. BMI indicates morbid obesity. No intakes documented. Visualized lunch tray with <25% completion and pt reports she will likely not eat much more of this meal. She states she ate 2-3 small pancakes with butter and sugar-free syrup for breakfast this AM. Pt states that for several weeks PTA she was having intermittent nausea without associated emesis. Pt states that nothing seemed to exacerbate this feeling; if it occurred at the time of  waking in the AM eating would resolve it but if it occurred later in the day nothing seemed to resolve it. Pt states that nausea is well controlled today. She states that in the few weeks PTA she was typically only eating once/day due to nauseous feeling.  Physical assessment shows no muscle or fat wasting, mild edema present. Unable to discuss weight hx at this time and no weight hx available in the chart; pt may meet criteria for malnutrition if significant weight loss has occurred and will need to document on this at follow-up.   She states that PTA she was attempting to focus on protein foods. Encouraged pt to continue this practice during admission. Talked with her about nutrition supplements available during hospitalization and she is very interested in trying Ensure. Will order supplements as outlined above and adjust as needed at follow-up.   Pt not meeting needs at this time. IVF: NS @ 125 mL/hr. Medications reviewed. Labs reviewed; Na: 129 mmol/L, K: 2.9 mmol/L, Cl: 97 mmol/L, BUN/creatinine elevated, GFR: 41.   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  Estimated Nutritional Needs:   Kcal:  2100-2300  Protein:  130-140 grams  Fluid:  >/= 2.1 L/day  EDUCATION NEEDS:   No education needs identified at this time     Jarome Matin, MS, RD, LDN Inpatient Clinical Dietitian Pager # [phone] After hours/weekend pager # [phone]

## 2016-03-14 NOTE — H&P (Signed)
History and Physical    [name] VY:[id number] DOB: 1951-07-30 DOA: 03/13/2016  PCP: [name], MD  Patient just relocated to this area from [place].  [place]  Chief Complaint: Fever, fatigue, wanted sacral wound checked  HPI: Sharon Warner is a 65 y.o. woman with a history of paraplegia x 4 years (etiology unclear, wheelchair bound, chronic indwelling foley catheter), morbid obesity, HTN, DM (diet controlled), and vitamin D deficiency who relocated to this area today with her husband.  She apparently came straight to this ED via ambulance shortly after landing.  She complains of increased fatigue and recurrent fevers.  She also knew that she needed to have her sacral wound evaluated.  She has had nausea but no vomiting.  She explicitly denies chest pain or shortness of breath.  No abdominal pain.  No diarrhea.  No headaches.  No syncope/LOC.  Prior to her move, she received home health care for her sacral wound.  ED Course: The ED attending and PA evaluated the patient's wound with the assistance of multiple ED personnel.  The patient has a Stage 4 sacral ulcer (bone is visible) with active, purulent, malodorous drainage (pictures were not taken, but per report, Dr. Gilford Raid could fit her entire fist inside the wound).  The patient has evidence of severe sepsis with a lactic acidosis, AKI, marked leukocytosis, and hypokalemia.  She has received aggressive IV fluid resuscitation per protocol. She has received empiric aztreonam and flagyl.  Urine, blood, and wound cultures have been sent.  Hospitalist asked to admit.  PCCM consult requested due to severe sepsis and lactic acid level greater than 4 (even though she is not in shock at this time).  Review of Systems: As per HPI otherwise 10 point review of systems negative.    Past Medical History  Diagnosis Date  . Hypertension   . Diabetes mellitus without complication ([place])   . H/O paraplegia   . Chronic  indwelling Foley catheter   . Morbid obesity ([place])   . Lumbar spondylosis     Past Surgical History  Procedure Laterality Date  . Vaginal hysterectomy    . Joint replacement Bilateral     Hip  . Tonsillectomy       reports that she has never smoked. She does not have any smokeless tobacco history on file. She reports that she does not drink alcohol or use illicit drugs.  She is married.  She has two adopted children.  She does not receive disability income.  Her husband was employed prior to their relocation to [place].  Allergies  Allergen Reactions  . Levaquin [Levofloxacin In D5w] Other (See Comments)    Unknown childhood allergy  . Penicillins Other (See Comments)    Unknown childhood allergy    Family History  Problem Relation Age of Onset  . Hypothyroidism Mother   . Diabetes Maternal Grandmother   . Heart disease Maternal Grandfather      Prior to Admission medications   Medication Sig Start Date End Date Taking? Authorizing Provider  aspirin EC 81 MG tablet Take 81 mg by mouth daily.   Yes Historical Provider, MD  B Complex-C (B-COMPLEX WITH VITAMIN C) tablet Take 1 tablet by mouth daily.   Yes Historical Provider, MD  CRANBERRY PO Take 1 capsule by mouth daily.   Yes Historical Provider, MD  furosemide (LASIX) 40 MG tablet Take 40 mg by mouth daily.   Yes Historical Provider, MD  metoprolol succinate (TOPROL-XL) 100 MG 24  hr tablet Take 200 mg by mouth daily. Take with or immediately following a meal.   Yes Historical Provider, MD  potassium chloride (K-DUR) 10 MEQ tablet Take 10 mEq by mouth 2 (two) times daily.   Yes Historical Provider, MD  Vitamin D, Ergocalciferol, (DRISDOL) 50000 units CAPS capsule Take 50,000 Units by mouth every 7 (seven) days.   Yes Historical Provider, MD  zinc sulfate 220 (50 Zn) MG capsule Take 220 mg by mouth daily.   Yes Historical Provider, MD    Physical Exam: Filed Vitals:   03/14/16 0011 03/14/16 0012 03/14/16 0030  03/14/16 0104  BP:  114/61 113/70 104/69  Pulse: 106  106 107  Temp:      TempSrc:      Resp: 34 37 32 33  Height:      Weight:      SpO2: 100%  100% 100%      Constitutional: NAD, calm, comfortable, surprisingly nontoxic appearing Filed Vitals:   03/14/16 0011 03/14/16 0012 03/14/16 0030 03/14/16 0104  BP:  114/61 113/70 104/69  Pulse: 106  106 107  Temp:      TempSrc:      Resp: 34 37 32 33  Height:      Weight:      SpO2: 100%  100% 100%   Eyes: PERRL, lids and conjunctivae normal ENMT: Mucous membranes are moist. Posterior pharynx clear of any exudate or lesions.Normal dentition.  Neck: thick but supple Respiratory: clear to auscultation bilaterally, no wheezing, no crackles. Normal respiratory effort. No accessory muscle use.  Cardiovascular: Regular rate and rhythm, no murmurs / rubs / gallops. No significant pitting edema.  2+ pedal pulses. Abdomen: super obese, no tenderness, no abdominal wall edema.  Bowel sounds positive.  Musculoskeletal: No joint deformity upper and lower extremities. Good ROM, no contractures. No voluntary movement in bilateral lower extremities. Skin: Stage 4 sacral decubitus ulcer present on admission, as documented in the ED.  Repeat visualization deferred by me. Neurologic: CN 2-12 grossly intact. Decreased sensation in bilateral lower extremities. Psychiatric: Normal judgment and insight. Alert and oriented x 3. Normal mood.     Labs on Admission: I have personally reviewed following labs and imaging studies  CBC:  Recent Labs Lab 03/13/16 2247  WBC 23.5*  NEUTROABS 21.1*  HGB 13.3  HCT 40.6  MCV 82.9  PLT [id number]*   Basic Metabolic Panel:  Recent Labs Lab 03/13/16 2247  NA 130*  K 2.9*  CL 94*  CO2 20*  GLUCOSE 285*  BUN 22*  CREATININE 1.35*  CALCIUM 10.4*   GFR: Estimated Creatinine Clearance: 62.9 mL/min (by C-G formula based on Cr of 1.35). Liver Function Tests:  Recent Labs Lab 03/13/16 2247  AST 39  ALT 21   ALKPHOS 88  BILITOT 1.1  PROT 7.5  ALBUMIN 3.2*   Cardiac Enzymes:  Recent Labs Lab 03/13/16 2247  TROPONINI 0.03   Urine analysis:    Component Value Date/Time   COLORURINE YELLOW 03/13/2016 2241   APPEARANCEUR TURBID* 03/13/2016 2241   LABSPEC 1.011 03/13/2016 2241   PHURINE 6.0 03/13/2016 2241   GLUCOSEU NEGATIVE 03/13/2016 2241   HGBUR MODERATE* 03/13/2016 2241   BILIRUBINUR NEGATIVE 03/13/2016 2241   KETONESUR NEGATIVE 03/13/2016 2241   PROTEINUR 100* 03/13/2016 2241   NITRITE NEGATIVE 03/13/2016 2241   LEUKOCYTESUR LARGE* 03/13/2016 2241   Sepsis Labs:  Lactic acid level 6.38  Radiological Exams on Admission: Dg Chest 2 View  03/13/2016  CLINICAL DATA:  Initial evaluation  for acute shortness of breath. History of CHF, hypertension, diabetes. EXAM: CHEST  2 VIEW COMPARISON:  None. FINDINGS: Accentuation of the cardiac silhouette related AP technique and shallow lung inflation. Mediastinal silhouette within normal limits. Lungs are hypoinflated with elevation the right hemidiaphragm. Associated mild right basilar atelectasis. Lungs otherwise clear without focal infiltrate or pulmonary edema. No pleural effusion. No pneumothorax. No acute osseous abnormality. IMPRESSION: 1. Shallow lung inflation with elevation the right hemidiaphragm. Associated mild right basilar atelectasis. 2. No other active cardiopulmonary disease. Electronically Signed   By: Jeannine Boga M.D.   On: 03/13/2016 23:41   Dg Lumbar Spine 2-3 Views  03/13/2016  CLINICAL DATA:  Initial evaluation for decubitus ulcer. EXAM: LUMBAR SPINE - 2-3 VIEW COMPARISON:  None. FINDINGS: Five non rib-bearing lumbar type vertebral bodies. Vertebral body heights preserved. No acute fracture or subluxation. Moderate degenerative changes within the lower lumbar spine. Soft tissue ulceration posterior to the distal sacrum/coccyx with underlying osseous erosion. Large amount of retained stool within the rectal vault.  Gaseous distension of the bowels proximally. Osteopenia. IMPRESSION: 1. Decubitus ulcer with underlying osseous erosion of the distal sacrum/coccyx, better evaluated on concomitant radiograph of the sacrum/coccyx. 2. No other acute abnormality about the lumbar spine. 3. Moderate to advanced degenerative spondylolysis within the lower lumbar spine. 4. Large amount of retained stool within the rectal vault with gaseous distension proximally. Electronically Signed   By: Jeannine Boga M.D.   On: 03/13/2016 23:39   Dg Sacrum/coccyx  03/13/2016  CLINICAL DATA:  Initial evaluation for decubitus ulcer. EXAM: SACRUM AND COCCYX - 2+ VIEW COMPARISON:  None. FINDINGS: Soft tissue irregularity with emphysema present posterior to the sacrum/ coccyx, consistent with decubitus ulcer. Underlying distal sacrum/coccyx appears to be eroded. No dissecting soft tissue emphysema. No radiopaque foreign body. Remainder the visualized pelvis intact. Fixation hardware about the proximal femurs/hips. The left femoral proximal left femur appears subluxed superiorly relative to the left femoral neck. Osteopenia. Advanced degenerative changes within the visualized lower lumbar spine. IMPRESSION: 1. Soft tissue decubitus ulcer with underlying osseous erosion of the distal sacrum/coccyx. 2. Fixation hardware about the hips bilaterally. The proximal left femur appears subluxed superiorly relative to the left femoral neck. This is not well evaluated on this exam. Correlation with dedicated hip radiographs could be performed for further evaluation as clinically indicated. 3. Large amount of retained stool within the distal colon. 4. Osteopenia. Electronically Signed   By: Jeannine Boga M.D.   On: 03/13/2016 23:37   Ct Angio Chest Pe W/cm &/or Wo Cm  03/14/2016  CLINICAL DATA:  Low-grade fever for several days. EXAM: CT ANGIOGRAPHY CHEST WITH CONTRAST TECHNIQUE: Multidetector CT imaging of the chest was performed using the standard  protocol during bolus administration of intravenous contrast. Multiplanar CT image reconstructions and MIPs were obtained to evaluate the vascular anatomy. CONTRAST:  80 mL Isovue 370 COMPARISON:  Chest radiography 03/13/2016 FINDINGS: Mediastinum/Lymph Nodes: Thyroid tissue is enlarged and heterogeneous. There are bilateral nodules, right-sided nodule measuring roughly 2.9 cm. Study has technical limitations due to breathing artifact. Limited evaluation of the pulmonary arteries beyond the main pulmonary arteries. However, there is no evidence to suggest a large or central pulmonary embolism. Main pulmonary artery is large measuring up to 4.1 cm. The mid ascending thoracic aorta is also large for size measuring up to 4.2 cm. No evidence for an aortic dissection. Great vessels are patent. Descending thoracic aorta measures 3.3 cm. No evidence for chest lymphadenopathy. No significant pericardial fluid. Lungs/Pleura: Trachea  and mainstem bronchi are patent. No pleural effusions. Volume loss in the right lower lobe. Patchy areas of ground-glass densities are suggestive for atelectasis. No large areas of consolidation or confluent airspace disease. Upper abdomen: Elevation of the right hemidiaphragm. Cannot exclude hepatic steatosis. Liver appears to be prominent for size. Entire liver is not visualized. Musculoskeletal: No acute bone abnormality. Review of the MIP images confirms the above findings. IMPRESSION: Limited evaluation for pulmonary emboli due to motion artifact and poor opacification of the pulmonary arteries. No evidence for a large central pulmonary embolism but poor characterization of the pulmonary arteries beyond the main pulmonary arteries. Enlarged main pulmonary artery could represent underlying pulmonary hypertension. Enlargement of the ascending thoracic aorta measuring up to 4.2 cm. Recommend annual imaging followup by CTA or MRA. This recommendation follows 2010  ACCF/AHA/AATS/ACR/ASA/SCA/SCAI/SIR/STS/SVM Guidelines for the Diagnosis and Management of Patients with Thoracic Aortic Disease. Circulation. 2010; 121: [id number] Scattered ground-glass densities in lungs likely related to atelectasis. Probable hepatomegaly. Thyroid nodules. Recommend further characterization with thyroid ultrasound. Electronically Signed   By: Markus Daft M.D.   On: 03/14/2016 00:30    EKG: Independently reviewed. Tachycardia, NSR with incomplete RBBB, LAFB.  ST elevations in III, avF, V1.  Discussed with cardiology fellow on call, who reviewed EKG per my request.  Repeat EKG at slower rate recommended.  In the absence of symptoms and negative troponin, continue to monitor for now.  Assessment/Plan Principal Problem:   Severe sepsis (HCC) Active Problems:   Morbid obesity (HCC)   Hypokalemia   Type 2 diabetes mellitus (HCC)   AKI (acute kidney injury) ([place])   Lactic acidosis   Multiple thyroid nodules   Enlarged thoracic aorta (HCC)   HTN (hypertension)    Severe sepsis with lactic acidosis secondary to infected Stage 4 sacral decubitus (osteomyelitis equivalent) --PCCM consult requested due to severity of illness --Continue aggressive maintenance fluids for now; holding anti-hypertensives --Empiric aztreonam, flagyl, and vancomycin per pharmacy consult --Wound, urine and blood cultures pending.  Abnormal U/A noted but question specimen since the patient has chronic foley catheter.  Await culture results. --Exchange foley catheter NOW --Check procalcitonin level --Repeat lactic acid level per protocol --CBC in the AM --Wound care consult in the AM --Will likely need ID consult in the AM as well for recommendations on duration of IV antibiotic therapy  AKI --HOLD lasix --Expect improvement to baseline with IV fluids  Hypokalemia --Replacement ordered --Repeat BMP in the AM  Type 2 DM, expect she is uncontrolled --A1c --Aggressive SSI coverage  AC/HS --Continue baby aspirin daily  Morbid obesity with history of paraplegia, wheelchair bound at baseline --Will need case management assistance with discharge planning, home health/home care needs --PT/OT eval and treat when appropriate  Incidental finding of thyroid nodules --Will need dedicated ultrasound at some point --Will check TFTs  Incidental finding of enlarged thoracic aorta --will need dedicated CTA or MRA at some point   DVT prophylaxis: Lovenox Code Status: FULL Family Communication: Husband gone home by the time I arrived at bedside for admission in the ED Disposition Plan: To be determined Consults called: PCCM requested by ED personnel Admission status: Inpatient, SDU   Eber Jones MD [facility]  If 7PM-7AM, please contact night-coverage [email or web address] Password [email or web address]  03/14/2016, 1:54 AM

## 2016-03-14 NOTE — Consult Note (Signed)
[place] for Infectious Disease            Date of Admission:  03/13/2016           Day 1 vancomycin        Day 1 aztreonam        Day 1 metronidazole       Reason for Consult: Sepsis complicating infected sacral decubitus    Referring Physician: Dr. Posey Pronto  Principal Problem:   Infected decubitus ulcer Active Problems:   Severe sepsis ([place])   AKI (acute kidney injury) ([place])   Lactic acidosis   Paraplegia (HCC)   Morbid obesity (HCC)   Hypokalemia   Type 2 diabetes mellitus (HCC)   Multiple thyroid nodules   Enlarged thoracic aorta (HCC)   HTN (hypertension)   Pressure ulcer   Neurogenic bladder   Obstructive sleep apnea   Normocytic anemia   . aspirin EC  81 mg Oral Daily  . aztreonam  2 g Intravenous Q8H  . collagenase   Topical Daily  . enoxaparin (LOVENOX) injection  0.5 mg/kg Subcutaneous Q24H  . feeding supplement (ENSURE ENLIVE)  237 mL Oral TID BM  . feeding supplement (PRO-STAT SUGAR FREE 64)  30 mL Oral Daily  . insulin aspart  0-20 Units Subcutaneous TID WC  . insulin aspart  0-5 Units Subcutaneous QHS  . insulin glargine  8 Units Subcutaneous Daily  . metronidazole  500 mg Intravenous Q8H  . multivitamin with minerals  1 tablet Oral Daily  . polyethylene glycol  17 g Oral Daily  . senna-docusate  1 tablet Oral BID  . [START ON 03/15/2016] vancomycin  1,250 mg Intravenous Q24H    Recommendations: 1. Continue current antibiotics pending final blood culture and MRI scan results   Assessment: Sharon Warner has a sacral pressure sore which has been worsening over the past 6 months and is now infected. She is at high risk for pelvic osteomyelitis. I believe this is the most likely source for her sepsis. She may also have a symptomatic urinary tract infection. Her printed records from [place] do not reveal any recent culture data and give more no more detail about her history of penicillin allergy. I agree with current empiric antibiotic  therapy pending culture results, MRI results and further observation.    HPI: Sharon Warner is a 65 y.o. female who developed progressive lower extremity weakness starting in 2014. She became paraplegic. She was living in [place] at that time and she saw multiple specialists but states that no one was ever able to explain why she became paraplegic. She has been bed and wheelchair-bound since that time. She has a neurogenic bladder with chronic indwelling Foley catheter. She states that she developed a sacral pressure sore about 6 months ago and that it has been getting progressively larger. She was not hospitalized. She did not have a special bed. She had a nurse's aide changing her dressing every 1-2 days. She was recently diagnosed with a urinary tract infection but was not started on antibiotics. She thinks she has only been on antibiotics on one occasion in the past 12 months for a UTI. She and her family decided to move here to [place]. She was hoping to find new doctors who could help her. She states that she has been feeling progressively weaker for the past month. She started having chills and sweats one month ago as well. She was in the process of  lying down here yesterday when she got much weaker. After landing EMS was called and she was brought here and admitted. She was in septic shock. Evaluation the emergency department revealed a large sacral wound with exposed bone and purulent, malodorous drainage. She is feeling better today. She has a history of remote penicillin allergy. She is not sure what the reaction was. She thinks she has tolerated amoxicillin in the past.   Review of Systems: Review of Systems  Constitutional: Positive for fever, chills, malaise/fatigue and diaphoresis. Negative for weight loss.       She has had recent anorexia. She is not sure if she has had any change in her weight.  HENT: Negative for sore throat.   Respiratory: Positive for shortness of breath. Negative  for cough and sputum production.   Cardiovascular: Negative for chest pain.  Gastrointestinal: Negative for heartburn, nausea, vomiting, abdominal pain and diarrhea.  Musculoskeletal: Negative for myalgias and joint pain.  Skin: Negative for rash.  Neurological: Positive for focal weakness and weakness. Negative for headaches.    Past Medical History  Diagnosis Date  . Hypertension   . Diabetes mellitus without complication ([place])   . H/O paraplegia     ascending paralysis unclear etiology  . Chronic indwelling Foley catheter     last changed few days ago  . Morbid obesity ([place])   . Lumbar spondylosis   . OSA on CPAP     Social History  Substance Use Topics  . Smoking status: Never Smoker   . Smokeless tobacco: None  . Alcohol Use: No    Family History  Problem Relation Age of Onset  . Hypothyroidism Mother   . Diabetes Maternal Grandmother   . Heart disease Maternal Grandfather    Allergies  Allergen Reactions  . Levaquin [Levofloxacin In D5w] Other (See Comments)    Unknown childhood allergy  . Penicillins Other (See Comments)    Unknown childhood allergy    OBJECTIVE: Blood pressure 147/45, pulse 91, temperature 98.9 F (37.2 C), temperature source Oral, resp. rate 29, height 5\' 1"  (1.549 m), weight 250 lb 14.1 oz (113.8 kg), SpO2 95 %.  Physical Exam  Constitutional: She is oriented to person, place, and time.  She is alert and calm and resting quietly in bed.  HENT:  Mouth/Throat: No oropharyngeal exudate.  Eyes: Conjunctivae are normal.  Cardiovascular: Normal rate and regular rhythm.   No murmur heard. Distant heart sounds.  Pulmonary/Chest: Effort normal and breath sounds normal. She has no wheezes. She has no rales.  Abdominal: Soft. There is no tenderness.  Neurological: She is alert and oriented to person, place, and time.  Skin: No rash noted.  Psychiatric: Mood and affect normal.    Lab Results Lab Results  Component Value Date   WBC 33.0*  03/14/2016   HGB 11.5* 03/14/2016   HCT 35.5* 03/14/2016   MCV 83.3 03/14/2016   PLT 341 03/14/2016    Lab Results  Component Value Date   CREATININE 1.34* 03/14/2016   BUN 23* 03/14/2016   NA 129* 03/14/2016   K 3.9 03/14/2016   CL 97* 03/14/2016   CO2 20* 03/14/2016    Lab Results  Component Value Date   ALT 21 03/13/2016   AST 39 03/13/2016   ALKPHOS 88 03/13/2016   BILITOT 1.1 03/13/2016     Microbiology: Recent Results (from the past 240 hour(s))  Wound or Superficial Culture     Status: None (Preliminary result)   Collection Time: 03/13/16  10:38 PM  Result Value Ref Range Status   Specimen Description DECUBITIS  Final   Special Requests Normal  Final   Gram Stain   Final    RARE WBC PRESENT, PREDOMINANTLY PMN MODERATE GRAM POSITIVE COCCI IN PAIRS FEW GRAM VARIABLE ROD Performed at [facility]    Culture PENDING  Incomplete   Report Status PENDING  Incomplete  Blood Culture (routine x 2)     Status: None (Preliminary result)   Collection Time: 03/13/16 10:49 PM  Result Value Ref Range Status   Specimen Description LEFT ANTECUBITAL  Final   Special Requests BOTTLES DRAWN AEROBIC AND ANAEROBIC 5CC  Final   Culture   Final    NO GROWTH < 24 HOURS Performed at [facility]    Report Status PENDING  Incomplete  Blood Culture (routine x 2)     Status: None (Preliminary result)   Collection Time: 03/13/16 11:35 PM  Result Value Ref Range Status   Specimen Description BLOOD RIGHT FOREARM  Final   Special Requests BOTTLES DRAWN AEROBIC AND ANAEROBIC 5CC  Final   Culture   Final    NO GROWTH < 12 HOURS Performed at [facility]    Report Status PENDING  Incomplete  MRSA PCR Screening     Status: None   Collection Time: 03/14/16  1:58 AM  Result Value Ref Range Status   MRSA by PCR NEGATIVE NEGATIVE Final    Comment:        The GeneXpert MRSA Assay (FDA approved for NASAL specimens only), is one component of a comprehensive MRSA  colonization surveillance program. It is not intended to diagnose MRSA infection nor to guide or monitor treatment for MRSA infections.     Michel Bickers, MD [facility] [place] [facility] [phone] pager   [phone] cell 03/14/2016, 4:30 PM

## 2016-03-14 NOTE — Progress Notes (Signed)
[facility] PLAN OF CARE NOTE Patient: Sharon Warner [id number]   PCP: [name], MD DOB: Feb 05, 1951   DOA: 03/13/2016   DOS: 03/14/2016    Patient was admitted by my colleague Dr.Carter earlier on 03/14/2016. I have reviewed the H&P as well as assessment and plan and agree with the same. Important changes in the plan are listed below.  Plan of care: Principal Problem:   Infected decubitus ulcer Continue IV antibiotics at present. Infectious diseases consulted. MRI spine ordered. Continue IV fluids.  Active Problems: Hypokalemia. Improving.    Type 2 diabetes mellitus (HCC) Hemoglobin A1c pending. Patient does not use insulin or any oral hypoglycemic agents at home. We will add 8 units of Lantus. Sliding scale insulin.  Author: Berle Mull, MD [facility] Pager: [phone] 03/14/2016 7:20 PM   If 7PM-7AM, please contact night-coverage at [email or web address], password [facility]

## 2016-03-14 NOTE — Progress Notes (Signed)
Per Dr. Ammie Dalton verbal order, foley catheter removed and replaced with new foley catheter.  Rudene Re

## 2016-03-14 NOTE — Progress Notes (Addendum)
WOC wound consult note Reason for Consult: Stage 4 sacral pressure injury on admission Wound type:Stage 4 pressure injury to sacrum Pressure Ulcer POA: Yes Measurement:4.5 cm x 3 cm x 3.1 cm. Has undermining from 9 o'clock to 10 o'clock measuring approximately 4 cm Wound bed: Patches of dark colored necrotic tissue Drainage (amount, consistency, odor) Minimal foul smelling serosanguinous drainage Periwound: Maceration around wound edges Dressing procedure/placement/frequency: Cleanse sacral wound with normal saline. Apply quarter size of Santyl Ointment to wound bed then loosely pack wound with moist 4x4 gauze. Cover with dry 4x4 and secure with tape. Change dressing daily.     Re consult if needed, will not follow at this time. Thanks, Melba Coon MSN, RN, [employer]

## 2016-03-14 NOTE — Consult Note (Signed)
PULMONARY / CRITICAL CARE MEDICINE   Name: Sharon Warner MRN: [id number] DOB: [birth year]    ADMISSION DATE:  03/13/2016 CONSULTATION DATE:  03/14/2016  REFERRING MD:  Lily Kocher, M.D. / [facility]  CHIEF COMPLAINT:  Sepsis  HISTORY OF PRESENT ILLNESS:  65 year old female with history of paraplegia for approximately 4 years with chronic indwelling Foley catheter. [place] with reported increased fatigue and subjective fever. Patient has experienced nausea without vomiting or diarrhea. No chest pain or pressure. No dyspnea or cough. No abdominal pain. No headache or vision changes. Patient had a purulent, malodorous drainage from her sacral decubitus ulcer. Patient was started on broad-spectrum antibiotic coverage in the emergency department. PCCM consult was requested due to elevated lactic acid. Patient confirms approximately one month of progressively worsening fatigue and malaise. She reports she was diagnosed with a urinary tract infection approximately 1 week ago but antibiotics were withheld. Patient reports that she has subsequently developed a fever to 104F at home with chills, rigors, and sweats. Denies any nausea, diarrhea, or abdominal pain. She has had decreased oral intake. She reports today she did experience some mild dyspnea but denies any cough. No chest pain or pressure. Has noted sediment within her urine.  PAST MEDICAL HISTORY :  Past Medical History  Diagnosis Date  . Hypertension   . Diabetes mellitus without complication ([place])   . H/O paraplegia     ascending paralysis unclear etiology  . Chronic indwelling Foley catheter     last changed few days ago  . Morbid obesity ([place])   . Lumbar spondylosis   . OSA on CPAP     PAST SURGICAL HISTORY: Past Surgical History  Procedure Laterality Date  . Vaginal hysterectomy    . Joint replacement Right     Hip  . Tonsillectomy       Allergies  Allergen Reactions  . Levaquin  [Levofloxacin In D5w] Other (See Comments)    Unknown childhood allergy  . Penicillins Other (See Comments)    Unknown childhood allergy    No current facility-administered medications on file prior to encounter.   No current outpatient prescriptions on file prior to encounter.    FAMILY HISTORY:  Family History  Problem Relation Age of Onset  . Hypothyroidism Mother   . Diabetes Maternal Grandmother   . Heart disease Maternal Grandfather     SOCIAL HISTORY: Social History  Substance Use Topics  . Smoking status: Never Smoker   . Smokeless tobacco: None  . Alcohol Use: No    REVIEW OF SYSTEMS:  No rashes or abnormal bruising. No headache, vision changes, or near syncope. A pertinent 14 point review of systems is negative except as per the history of presenting illness.  VITAL SIGNS: BP 98/48 mmHg  Pulse 89  Temp(Src) 98.8 F (37.1 C) (Oral)  Resp 25  Ht 5\' 1"  (1.549 m)  Wt 250 lb 14.1 oz (113.8 kg)  BMI 47.43 kg/m2  SpO2 100%  HEMODYNAMICS:    VENTILATOR SETTINGS:    INTAKE / OUTPUT:    PHYSICAL EXAMINATION: General:  Awake. Alert. No acute distress. Morbidly obese.  Integument:  Warm & dry. No rash on exposed skin. Sacral decubitus noted by nurse on admission reportedly stage III. Lymphatics:  No appreciated cervical or supraclavicular lymphadenoapthy. HEENT:  Tacky mucus membranes. No oral ulcers. No scleral injection or icterus.  Cardiovascular:  Regular rate. Normal S1 & S2. No appreciable JVD.  Pulmonary:  Good aeration & clear  to auscultation bilaterally. Symmetric chest wall expansion. No accessory muscle use on CPAP. Abdomen: Soft. Normal bowel sounds. Protuberant. Grossly nontender. Musculoskeletal:  Normal bulk . Hand grip strength 5/5 bilaterally. No joint deformity or effusion appreciated. Neurological:  CN 2-12 grossly in tact. No meningismus. Paraplegia. Psychiatric:  Mood and affect congruent. Speech normal rhythm, rate & tone.    LABS:  BMET  Recent Labs Lab 03/13/16 2247 03/14/16 0242  NA 130*  --   K 2.9*  --   CL 94*  --   CO2 20*  --   BUN 22*  --   CREATININE 1.35* 1.43*  GLUCOSE 285*  --     Electrolytes  Recent Labs Lab 03/13/16 2247  CALCIUM 10.4*    CBC  Recent Labs Lab 03/13/16 2247 03/14/16 0242  WBC 23.5* 29.9*  HGB 13.3 11.9*  HCT 40.6 37.1  PLT 452* 366    Coag's  Recent Labs Lab 03/14/16 0242  APTT 35  INR 1.53*    Sepsis Markers  Recent Labs Lab 03/13/16 2257 03/14/16 0242  LATICACIDVEN 6.38* 3.9*    ABG No results for input(s): PHART, PCO2ART, PO2ART in the last 168 hours.  Liver Enzymes  Recent Labs Lab 03/13/16 2247  AST 39  ALT 21  ALKPHOS 88  BILITOT 1.1  ALBUMIN 3.2*    Cardiac Enzymes  Recent Labs Lab 03/13/16 2247  TROPONINI 0.03    Glucose No results for input(s): GLUCAP in the last 168 hours.  Imaging Dg Chest 2 View  03/13/2016  CLINICAL DATA:  Initial evaluation for acute shortness of breath. History of CHF, hypertension, diabetes. EXAM: CHEST  2 VIEW COMPARISON:  None. FINDINGS: Accentuation of the cardiac silhouette related AP technique and shallow lung inflation. Mediastinal silhouette within normal limits. Lungs are hypoinflated with elevation the right hemidiaphragm. Associated mild right basilar atelectasis. Lungs otherwise clear without focal infiltrate or pulmonary edema. No pleural effusion. No pneumothorax. No acute osseous abnormality. IMPRESSION: 1. Shallow lung inflation with elevation the right hemidiaphragm. Associated mild right basilar atelectasis. 2. No other active cardiopulmonary disease. Electronically Signed   By: Jeannine Boga M.D.   On: 03/13/2016 23:41   Dg Lumbar Spine 2-3 Views  03/13/2016  CLINICAL DATA:  Initial evaluation for decubitus ulcer. EXAM: LUMBAR SPINE - 2-3 VIEW COMPARISON:  None. FINDINGS: Five non rib-bearing lumbar type vertebral bodies. Vertebral body heights preserved. No  acute fracture or subluxation. Moderate degenerative changes within the lower lumbar spine. Soft tissue ulceration posterior to the distal sacrum/coccyx with underlying osseous erosion. Large amount of retained stool within the rectal vault. Gaseous distension of the bowels proximally. Osteopenia. IMPRESSION: 1. Decubitus ulcer with underlying osseous erosion of the distal sacrum/coccyx, better evaluated on concomitant radiograph of the sacrum/coccyx. 2. No other acute abnormality about the lumbar spine. 3. Moderate to advanced degenerative spondylolysis within the lower lumbar spine. 4. Large amount of retained stool within the rectal vault with gaseous distension proximally. Electronically Signed   By: Jeannine Boga M.D.   On: 03/13/2016 23:39   Dg Sacrum/coccyx  03/13/2016  CLINICAL DATA:  Initial evaluation for decubitus ulcer. EXAM: SACRUM AND COCCYX - 2+ VIEW COMPARISON:  None. FINDINGS: Soft tissue irregularity with emphysema present posterior to the sacrum/ coccyx, consistent with decubitus ulcer. Underlying distal sacrum/coccyx appears to be eroded. No dissecting soft tissue emphysema. No radiopaque foreign body. Remainder the visualized pelvis intact. Fixation hardware about the proximal femurs/hips. The left femoral proximal left femur appears subluxed superiorly relative to the  left femoral neck. Osteopenia. Advanced degenerative changes within the visualized lower lumbar spine. IMPRESSION: 1. Soft tissue decubitus ulcer with underlying osseous erosion of the distal sacrum/coccyx. 2. Fixation hardware about the hips bilaterally. The proximal left femur appears subluxed superiorly relative to the left femoral neck. This is not well evaluated on this exam. Correlation with dedicated hip radiographs could be performed for further evaluation as clinically indicated. 3. Large amount of retained stool within the distal colon. 4. Osteopenia. Electronically Signed   By: Jeannine Boga M.D.   On:  03/13/2016 23:37   Ct Angio Chest Pe W/cm &/or Wo Cm  03/14/2016  CLINICAL DATA:  Low-grade fever for several days. EXAM: CT ANGIOGRAPHY CHEST WITH CONTRAST TECHNIQUE: Multidetector CT imaging of the chest was performed using the standard protocol during bolus administration of intravenous contrast. Multiplanar CT image reconstructions and MIPs were obtained to evaluate the vascular anatomy. CONTRAST:  80 mL Isovue 370 COMPARISON:  Chest radiography 03/13/2016 FINDINGS: Mediastinum/Lymph Nodes: Thyroid tissue is enlarged and heterogeneous. There are bilateral nodules, right-sided nodule measuring roughly 2.9 cm. Study has technical limitations due to breathing artifact. Limited evaluation of the pulmonary arteries beyond the main pulmonary arteries. However, there is no evidence to suggest a large or central pulmonary embolism. Main pulmonary artery is large measuring up to 4.1 cm. The mid ascending thoracic aorta is also large for size measuring up to 4.2 cm. No evidence for an aortic dissection. Great vessels are patent. Descending thoracic aorta measures 3.3 cm. No evidence for chest lymphadenopathy. No significant pericardial fluid. Lungs/Pleura: Trachea and mainstem bronchi are patent. No pleural effusions. Volume loss in the right lower lobe. Patchy areas of ground-glass densities are suggestive for atelectasis. No large areas of consolidation or confluent airspace disease. Upper abdomen: Elevation of the right hemidiaphragm. Cannot exclude hepatic steatosis. Liver appears to be prominent for size. Entire liver is not visualized. Musculoskeletal: No acute bone abnormality. Review of the MIP images confirms the above findings. IMPRESSION: Limited evaluation for pulmonary emboli due to motion artifact and poor opacification of the pulmonary arteries. No evidence for a large central pulmonary embolism but poor characterization of the pulmonary arteries beyond the main pulmonary arteries. Enlarged main  pulmonary artery could represent underlying pulmonary hypertension. Enlargement of the ascending thoracic aorta measuring up to 4.2 cm. Recommend annual imaging followup by CTA or MRA. This recommendation follows 2010 ACCF/AHA/AATS/ACR/ASA/SCA/SCAI/SIR/STS/SVM Guidelines for the Diagnosis and Management of Patients with Thoracic Aortic Disease. Circulation. 2010; 121: [id number] Scattered ground-glass densities in lungs likely related to atelectasis. Probable hepatomegaly. Thyroid nodules. Recommend further characterization with thyroid ultrasound. Electronically Signed   By: Markus Daft M.D.   On: 03/14/2016 00:30     STUDIES:  Lumbar Spine X-ray 6/14: Decubitus ulcer with underlying osseous erosion of distal sacrum/coccyx. No acute abnormality of lumbar spine. Sacral X-ray 6/14: Soft tissue decubitus ulcer with underlying osseous erosion of distal sacrum/coccyx. Proximal left femur appears subluxed superiorly relative to left femoral neck. CTA CHEST 6/15:  Poor study with motion artifact. No large central pulmonary embolus. 4.2 cm evaluationin the aorta. Enlarged main pulmonary artery. Thyroid nodules noted. No focal opacity. No pleural effusion or thickening. No pericardial effusion. No pathologic mediastinal adenopathy.  MICROBIOLOGY: Wound Culture 6/14 >>> Blood Ctx x2 6/14 >>> Urine Ctx 6/14 >>>  ANTIBIOTICS: Vancomycin 6/15 >> Flagyl 6/15 >> Aztreonam 6/15 >>  SIGNIFICANT EVENTS: 6/14 - Admit  LINES/TUBES: PIV x2  ASSESSMENT / PLAN:  65 year old female with history of paraplegia and chronic indwelling  Foley catheter. Presenting with sepsis and lactic acidosis. No prior labs to correlate with regard to her elevated creatinine which could represent acute renal failure as well. Leukocytosis is most certainly secondary to her underlying sepsis. Source for the patient's sepsis could certainly be due to her sacral decubitus ulcer however her urinalysis also shows a have a leukocyte count and  many bacteria.  1. Sepsis: Likely secondary to urinary tract infection versus sacral decubitus with osteomyelitis given findings on x-rays as noted above. Currently on broad-spectrum antibiotic therapy with vancomycin, Flagyl, and aztreonam. Blood, urine, and wound cultures have been sent. Recommend further tapering antibiotics depending upon these culture results.  2. UTI with chronic indwelling Foley catheter: Recommend changing patient's Foley catheter in the next 24-48 hours. I do not believe the patient has colonization of her catheter as this is a relatively new catheter and this likely represents an acute infection. 3. Sacral decubitus with probable osteomyelitis: Wound care has been consulted. Recommend infectious diseases consult to provide further recommendations on antibiotic therapy and further workup. 4. Lactic acidosis: Resolving post-IV fluid administration. Recommend continuing to trend lactic acid. 5. OSA: Recommend continuing nocturnal CPAP therapy.  At this time I do not feel the patient requires ICU level care or admission. Please contact us if we can be of any further assistance in the care of this patient.  Sonia Baller Ashok Cordia, M.D. [facility] Pager:  [phone] After 3pm or if no response, call [phone] 3:39 AM 03/14/2016

## 2016-03-15 ENCOUNTER — Ambulatory Visit (HOSPITAL_COMMUNITY)
Admit: 2016-03-15 | Discharge: 2016-03-15 | Disposition: A | Discharge status: Home / Self Care | Payer: BLUE CROSS/BLUE SHIELD | Attending: Internal Medicine | Admitting: Internal Medicine

## 2016-03-15 DIAGNOSIS — I10 Essential (primary) hypertension: Secondary | ICD-10-CM

## 2016-03-15 DIAGNOSIS — D649 Anemia, unspecified: Secondary | ICD-10-CM

## 2016-03-15 DIAGNOSIS — M869 Osteomyelitis, unspecified: Secondary | ICD-10-CM

## 2016-03-15 DIAGNOSIS — E118 Type 2 diabetes mellitus with unspecified complications: Secondary | ICD-10-CM

## 2016-03-15 DIAGNOSIS — L89159 Pressure ulcer of sacral region, unspecified stage: Secondary | ICD-10-CM | POA: Insufficient documentation

## 2016-03-15 DIAGNOSIS — R7881 Bacteremia: Secondary | ICD-10-CM

## 2016-03-15 LAB — BLOOD CULTURE ID PANEL (REFLEXED)
ACINETOBACTER BAUMANNII: NOT DETECTED
Acinetobacter baumannii: NOT DETECTED
CANDIDA GLABRATA: NOT DETECTED
CANDIDA KRUSEI: NOT DETECTED
CANDIDA PARAPSILOSIS: NOT DETECTED
CANDIDA TROPICALIS: NOT DETECTED
CARBAPENEM RESISTANCE: NOT DETECTED
CARBAPENEM RESISTANCE: NOT DETECTED
Candida albicans: NOT DETECTED
Candida albicans: NOT DETECTED
Candida glabrata: NOT DETECTED
Candida krusei: NOT DETECTED
Candida parapsilosis: NOT DETECTED
Candida tropicalis: NOT DETECTED
ENTEROCOCCUS SPECIES: NOT DETECTED
ESCHERICHIA COLI: DETECTED — AB
Enterobacter cloacae complex: NOT DETECTED
Enterobacter cloacae complex: NOT DETECTED
Enterobacteriaceae species: DETECTED — AB
Enterobacteriaceae species: NOT DETECTED
Enterococcus species: NOT DETECTED
Escherichia coli: NOT DETECTED
HAEMOPHILUS INFLUENZAE: NOT DETECTED
HAEMOPHILUS INFLUENZAE: NOT DETECTED
Klebsiella oxytoca: NOT DETECTED
Klebsiella oxytoca: NOT DETECTED
Klebsiella pneumoniae: NOT DETECTED
Klebsiella pneumoniae: NOT DETECTED
LISTERIA MONOCYTOGENES: NOT DETECTED
Listeria monocytogenes: NOT DETECTED
METHICILLIN RESISTANCE: NOT DETECTED
Methicillin resistance: NOT DETECTED
NEISSERIA MENINGITIDIS: NOT DETECTED
Neisseria meningitidis: NOT DETECTED
PROTEUS SPECIES: NOT DETECTED
PROTEUS SPECIES: NOT DETECTED
Pseudomonas aeruginosa: NOT DETECTED
Pseudomonas aeruginosa: NOT DETECTED
SERRATIA MARCESCENS: NOT DETECTED
SERRATIA MARCESCENS: NOT DETECTED
STAPHYLOCOCCUS SPECIES: NOT DETECTED
STAPHYLOCOCCUS SPECIES: NOT DETECTED
STREPTOCOCCUS AGALACTIAE: NOT DETECTED
STREPTOCOCCUS PYOGENES: NOT DETECTED
STREPTOCOCCUS PYOGENES: NOT DETECTED
STREPTOCOCCUS SPECIES: DETECTED — AB
Staphylococcus aureus (BCID): NOT DETECTED
Staphylococcus aureus (BCID): NOT DETECTED
Streptococcus agalactiae: NOT DETECTED
Streptococcus pneumoniae: NOT DETECTED
Streptococcus pneumoniae: NOT DETECTED
Streptococcus species: DETECTED — AB
Vancomycin resistance: NOT DETECTED
Vancomycin resistance: NOT DETECTED

## 2016-03-15 LAB — LACTIC ACID, PLASMA: Lactic Acid, Venous: 2.5 mmol/L (ref 0.5–2.0)

## 2016-03-15 LAB — CBC WITH DIFFERENTIAL/PLATELET
BASOS ABS: 0 10*3/uL (ref 0.0–0.1)
BASOS PCT: 0 %
EOS ABS: 0.2 10*3/uL (ref 0.0–0.7)
Eosinophils Relative: 1 %
HCT: 33.2 % — ABNORMAL LOW (ref 36.0–46.0)
HEMOGLOBIN: 11 g/dL — AB (ref 12.0–15.0)
LYMPHS PCT: 18 %
Lymphs Abs: 2.7 10*3/uL (ref 0.7–4.0)
MCH: 26.9 pg (ref 26.0–34.0)
MCHC: 33.1 g/dL (ref 30.0–36.0)
MCV: 81.2 fL (ref 78.0–100.0)
Monocytes Absolute: 0.9 10*3/uL (ref 0.1–1.0)
Monocytes Relative: 6 %
NEUTROS PCT: 75 %
Neutro Abs: 11.3 10*3/uL — ABNORMAL HIGH (ref 1.7–7.7)
Platelets: 318 10*3/uL (ref 150–400)
RBC: 4.09 MIL/uL (ref 3.87–5.11)
RDW: 15.1 % (ref 11.5–15.5)
WBC: 15.1 10*3/uL — ABNORMAL HIGH (ref 4.0–10.5)

## 2016-03-15 LAB — URINE CULTURE

## 2016-03-15 LAB — GLUCOSE, CAPILLARY
GLUCOSE-CAPILLARY: 240 mg/dL — AB (ref 65–99)
GLUCOSE-CAPILLARY: 247 mg/dL — AB (ref 65–99)
GLUCOSE-CAPILLARY: 192 mg/dL — AB (ref 65–99)
Glucose-Capillary: 191 mg/dL — ABNORMAL HIGH (ref 65–99)

## 2016-03-15 LAB — COMPREHENSIVE METABOLIC PANEL
ALBUMIN: 2.5 g/dL — AB (ref 3.5–5.0)
ALK PHOS: 51 U/L (ref 38–126)
ALT: 16 U/L (ref 14–54)
AST: 21 U/L (ref 15–41)
Anion gap: 9 (ref 5–15)
BUN: 23 mg/dL — ABNORMAL HIGH (ref 6–20)
CALCIUM: 9.3 mg/dL (ref 8.9–10.3)
CO2: 19 mmol/L — AB (ref 22–32)
CREATININE: 1 mg/dL (ref 0.44–1.00)
Chloride: 107 mmol/L (ref 101–111)
GFR calc Af Amer: >60 mL/min (ref >60)
GFR calc non Af Amer: 59 mL/min — ABNORMAL LOW (ref >60)
GLUCOSE: 233 mg/dL — AB (ref 65–99)
Potassium: 3.8 mmol/L (ref 3.5–5.1)
SODIUM: 135 mmol/L (ref 135–145)
Total Bilirubin: 0.5 mg/dL (ref 0.3–1.2)
Total Protein: 6.1 g/dL — ABNORMAL LOW (ref 6.5–8.1)

## 2016-03-15 LAB — PROTIME-INR
INR: 1.53 — ABNORMAL HIGH (ref 0.00–1.49)
Prothrombin Time: 17.9 seconds — ABNORMAL HIGH (ref 11.6–15.2)

## 2016-03-15 LAB — CK: CK TOTAL: 113 U/L (ref 38–234)

## 2016-03-15 LAB — HEMOGLOBIN A1C
HEMOGLOBIN A1C: 8.4 % — AB (ref 4.8–5.6)
MEAN PLASMA GLUCOSE: 194 mg/dL

## 2016-03-15 LAB — MAGNESIUM: Magnesium: 1.8 mg/dL (ref 1.7–2.4)

## 2016-03-15 LAB — C-REACTIVE PROTEIN: CRP: 21.9 mg/dL — AB (ref <1.0)

## 2016-03-15 LAB — SEDIMENTATION RATE: Sed Rate: 95 mm/hr — ABNORMAL HIGH (ref 0–22)

## 2016-03-15 MED ORDER — SENNOSIDES-DOCUSATE SODIUM 8.6-50 MG PO TABS: 1.0000 | ORAL_TABLET | Freq: Two times a day (BID) | ORAL | Status: DC | PRN

## 2016-03-15 MED ORDER — SODIUM CHLORIDE 0.9 % IV SOLN
1.0000 g | INTRAVENOUS | Status: DC
  Administered 2016-03-15 – 2016-03-20 (×6): 1 g via INTRAVENOUS
  Filled 2016-03-15 (×6): qty 1

## 2016-03-15 MED ORDER — LIVING WELL WITH DIABETES BOOK
Freq: Once | Status: AC
  Administered 2016-03-15: 18:00:00
  Filled 2016-03-15: qty 1

## 2016-03-15 MED ORDER — CHLORHEXIDINE GLUCONATE 0.12 % MT SOLN
15.0000 mL | Freq: Two times a day (BID) | OROMUCOSAL | Status: DC
  Administered 2016-03-16 – 2016-03-21 (×9): 15 mL via OROMUCOSAL
  Filled 2016-03-15 (×12): qty 15

## 2016-03-15 MED ORDER — CETYLPYRIDINIUM CHLORIDE 0.05 % MT LIQD
7.0000 mL | Freq: Two times a day (BID) | OROMUCOSAL | Status: DC
  Administered 2016-03-16 – 2016-03-21 (×8): 7 mL via OROMUCOSAL

## 2016-03-15 NOTE — Progress Notes (Signed)
[facility] Progress Note  Patient: Sharon Warner [id number]   PCP: [name], MD DOB: 1950/10/09   DOA: 03/13/2016   DOS: 03/15/2016   Date of Service: the patient was seen and examined on 03/15/2016  Subjective: Patient mentions she is feeling better. no nausea no vomiting. She prefers nutritional supplementations over food. Did have a BM yesterday. No worsening of her chronic weakness. Nutrition: Tolerating oral diet  Brief hospital course: Pt. with PMH of morbid obesity, OSA, type 2 diabetes mellitus, not on any medications, HTN, chronic paraplegia, chronic indwelling Foley catheter, chronic sacral ulcer; admitted on 03/13/2016, with complaint of generalized weakness and fatigue, was found to have sepsis probably from sacral ulcer infection with osteomyelitis. Currently further plan is continue IV antibiotics.  Assessment and Plan: 1. Infected decubitus ulcer causing sepsis Chronic sacral ulcer stage IV. Wound care consultation appreciated. ID consultation appreciated as well. Patient started on IV vancomycin, aztreonam, Flagyl. Blood culture 1/2 set is growing Escherichia coli as well as Streptococcus. We will repeat blood cultures today. Lactic acid is improving. CK is normal, ESR is 95 Leukocytosis is improving from 33 yesterday to 15.1 today. Still has sinus tachycardia. Continue current plan. Mild elevation of INR as there will give her oral vitamin K.  2. Type 2 diabetes mellitus. Hemoglobin A1c A1c 8.4. Currently we will continue with insulin Lantus as well as sliding scale coverage.  Titrate insulin Lantus tomorrow Sugars appears to be well controlled.  3. Objective sleep apnea. Morbid obesity. Continue CPAP daily at bedtime.  4. Sick euthyroid syndrome. Mild elevation of serum 54. Currently no indication for treatment at present.  5. Acute kidney injury. Serum creatinine is significantly improving. We will continue with gentle IV hydration.  6.  Essential hypertension. Blood pressure mildly elevated. Currently we will mostly monitoring the patient. Would avoid treatment until lactic acid normalizes and sepsis physiology resolves.  7. Chronic paraplegia. Neurogenic bladder. Chronic indwelling Foley catheter. Foley catheter changed on 03/14/2016 in ER. Patient will need neurology follow-up as an outpatient to establish care. At present no workup since no further worsening.  8. Constipation. Currently resolved. We will continue MiraLAX daily. Change Senokot 2 twice a day as needed.  9. Hypokalemia. Hyponatremia. Probably from dehydration as well as poor oral intake, Significantly improved with IV hydration. Daily BMP.  Pain management: When necessary Tylenol Activity: Consulted physical therapy Bowel regimen: last BM 03/14/2016 Diet: Regular diet carb modified DVT Prophylaxis: subcutaneous Heparin  Advance goals of care discussion: Full code  Family Communication: no family was present at bedside, at the time of interview.   Disposition:  Discharge to probably SNF pending physical therapy evaluation. Expected discharge date: 03/19/2016, blood culture clearance as well as further workup for sacral ulcer  Consultants: Infectious disease Procedures: None  Antibiotics: Anti-infectives    Start     Dose/Rate Route Frequency Ordered Stop   03/15/16 0600  vancomycin (VANCOCIN) 1,250 mg in sodium chloride 0.9 % 250 mL IVPB     1,250 mg 166.7 mL/hr over 90 Minutes Intravenous Every 24 hours 03/14/16 0234     03/14/16 0800  metroNIDAZOLE (FLAGYL) IVPB 500 mg    Comments:  Per pharmacy consult   500 mg 100 mL/hr over 60 Minutes Intravenous Every 8 hours 03/14/16 0215     03/14/16 0800  aztreonam (AZACTAM) 2 g in dextrose 5 % 50 mL IVPB     2 g 100 mL/hr over 30 Minutes Intravenous Every 8 hours 03/14/16 0226     03/14/16  0300  vancomycin (VANCOCIN) 2,500 mg in sodium chloride 0.9 % 500 mL IVPB     2,500 mg 250 mL/hr  over 120 Minutes Intravenous  Once 03/14/16 0229 03/14/16 0500   03/13/16 2330  metroNIDAZOLE (FLAGYL) IVPB 500 mg     500 mg 100 mL/hr over 60 Minutes Intravenous  Once 03/13/16 2257 03/14/16 0112   03/13/16 2300  aztreonam (AZACTAM) 2 g in dextrose 5 % 50 mL IVPB     2 g 100 mL/hr over 30 Minutes Intravenous STAT 03/13/16 2257 03/14/16 0013        Intake/Output Summary (Last 24 hours) at 03/15/16 0803 Last data filed at 03/15/16 0616  Gross per 24 hour  Intake 3353.75 ml  Output   2100 ml  Net 1253.75 ml   Filed Weights   03/13/16 2325 03/14/16 0200 03/15/16 0500  Weight: 158.759 kg (350 lb) 113.8 kg (250 lb 14.1 oz) 114.8 kg (253 lb 1.4 oz)    Objective: Physical Exam: Filed Vitals:   03/14/16 2158 03/15/16 0000 03/15/16 0400 03/15/16 0500  BP:  100/55 113/61   Pulse: 109 110 106   Temp:  98.7 F (37.1 C) 98 F (36.7 C)   TempSrc:  Oral Axillary   Resp: _0 Height:      Weight:    114.8 kg (253 lb 1.4 oz)  SpO2: 100% 97% 98%     General: Alert, Awake and Oriented to Time, Place and Person. Appear in mild distress Eyes: PERRL, Conjunctiva normal ENT: Oral Mucosa clear moist. Neck: difficult to assess JVD, no Abnormal Mass Or lumps Cardiovascular: S1 and S2 Present, no Murmur, Respiratory: Bilateral Air entry equal and Decreased, Clear to Auscultation, no Crackles, no wheezes Abdomen: Bowel Sound present, Soft and no tenderness Skin: Stage IV sacral ulcer Extremities: trace Pedal edema, no calf tenderness Neurologic: Grossly no focal neuro deficit. Bilaterally Equal motor strength chronic paraplegia bilaterally  Data Reviewed: CBC:  Recent Labs Lab 03/13/16 2247 03/14/16 0242 03/14/16 0512 03/15/16 0309  WBC 23.5* 29.9* 33.0* 15.1*  NEUTROABS 21.1*  --   --  11.3*  HGB 13.3 11.9* 11.5* 11.0*  HCT 40.6 37.1 35.5* 33.2*  MCV 82.9 84.1 83.3 81.2  PLT 452* 366 341 165   Basic Metabolic Panel:  Recent Labs Lab 03/13/16 2247 03/14/16 0242  03/14/16 0512 03/14/16 1353 03/15/16 0309  NA 130*  --  129*  --  135  K 2.9*  --  2.9* 3.9 3.8  CL 94*  --  97*  --  107  CO2 20*  --  20*  --  19*  GLUCOSE 285*  --  324*  --  233*  BUN 22*  --  23*  --  23*  CREATININE 1.35* 1.43* 1.34*  --  1.00  CALCIUM 10.4*  --  9.4  --  9.3  MG  --   --   --   --  1.8    Liver Function Tests:  Recent Labs Lab 03/13/16 2247 03/15/16 0309  AST 39 21  ALT 21 16  ALKPHOS 88 51  BILITOT 1.1 0.5  PROT 7.5 6.1*  ALBUMIN 3.2* 2.5*   No results for input(s): LIPASE, AMYLASE in the last 168 hours. No results for input(s): AMMONIA in the last 168 hours. Coagulation Profile:  Recent Labs Lab 03/14/16 0242 03/15/16 0309  INR 1.53* 1.53*   Cardiac Enzymes:  Recent Labs Lab 03/13/16 2247 03/15/16 0309  CKTOTAL  --  113  TROPONINI 0.03  --    BNP (last 3 results) No results for input(s): PROBNP in the last 8760 hours.  CBG:  Recent Labs Lab 03/14/16 0827 03/14/16 1149 03/14/16 1659 03/14/16 2132  GLUCAP 278* 222* 166* 183*    Studies: No results found.   Scheduled Meds: . antiseptic oral rinse  7 mL Mouth Rinse q12n4p  . aspirin EC  81 mg Oral Daily  . aztreonam  2 g Intravenous Q8H  . chlorhexidine  15 mL Mouth Rinse BID  . collagenase   Topical Daily  . enoxaparin (LOVENOX) injection  0.5 mg/kg Subcutaneous Q24H  . feeding supplement (ENSURE ENLIVE)  237 mL Oral TID BM  . feeding supplement (PRO-STAT SUGAR FREE 64)  30 mL Oral Daily  . insulin aspart  0-20 Units Subcutaneous TID WC  . insulin aspart  0-5 Units Subcutaneous QHS  . insulin glargine  8 Units Subcutaneous Daily  . metronidazole  500 mg Intravenous Q8H  . multivitamin with minerals  1 tablet Oral Daily  . polyethylene glycol  17 g Oral Daily  . senna-docusate  1 tablet Oral BID  . vancomycin  1,250 mg Intravenous Q24H   Continuous Infusions: . sodium chloride 1,000 mL (03/15/16 0500)   PRN Meds: acetaminophen **OR** acetaminophen, bisacodyl,  ondansetron **OR** ondansetron (ZOFRAN) IV  Time spent: 30 minutes  Author: Berle Mull, MD [facility] Pager: [phone] 03/15/2016 8:03 AM  If 7PM-7AM, please contact night-coverage at [email or web address], password [facility]

## 2016-03-15 NOTE — Progress Notes (Signed)
PHARMACY - PHYSICIAN COMMUNICATION CRITICAL VALUE ALERT - BLOOD CULTURE IDENTIFICATION (BCID)  Results for orders placed or performed during the hospital encounter of 03/13/16  Blood Culture ID Panel (Reflexed) (Collected: 03/13/2016 10:49 PM)  Result Value Ref Range   Enterococcus species NOT DETECTED NOT DETECTED   Vancomycin resistance NOT DETECTED NOT DETECTED   Listeria monocytogenes NOT DETECTED NOT DETECTED   Staphylococcus species NOT DETECTED NOT DETECTED   Staphylococcus aureus NOT DETECTED NOT DETECTED   Methicillin resistance NOT DETECTED NOT DETECTED   Streptococcus species DETECTED (A) NOT DETECTED   Streptococcus agalactiae NOT DETECTED NOT DETECTED   Streptococcus pneumoniae NOT DETECTED NOT DETECTED   Streptococcus pyogenes NOT DETECTED NOT DETECTED   Acinetobacter baumannii NOT DETECTED NOT DETECTED   Enterobacteriaceae species DETECTED (A) NOT DETECTED   Enterobacter cloacae complex NOT DETECTED NOT DETECTED   Escherichia coli DETECTED (A) NOT DETECTED   Klebsiella oxytoca NOT DETECTED NOT DETECTED   Klebsiella pneumoniae NOT DETECTED NOT DETECTED   Proteus species NOT DETECTED NOT DETECTED   Serratia marcescens NOT DETECTED NOT DETECTED   Carbapenem resistance NOT DETECTED NOT DETECTED   Haemophilus influenzae NOT DETECTED NOT DETECTED   Neisseria meningitidis NOT DETECTED NOT DETECTED   Pseudomonas aeruginosa NOT DETECTED NOT DETECTED   Candida albicans NOT DETECTED NOT DETECTED   Candida glabrata NOT DETECTED NOT DETECTED   Candida krusei NOT DETECTED NOT DETECTED   Candida parapsilosis NOT DETECTED NOT DETECTED   Candida tropicalis NOT DETECTED NOT DETECTED    Name of physician (or Provider) Contacted: Tylene Fantasia [phone] via text message.  Changes to prescribed antibiotics required: Current abx (Azactam/Vancomycin) provide adequate coverage.  Could consider stopping Flagyl.  And if patient PCN allergy is confirmed to be mild, Rocephin would provide appropriate  coverage.   Dorrene German 03/15/2016  2:03 AM

## 2016-03-15 NOTE — Progress Notes (Signed)
PT Cancellation Note  Patient Details Name: Sharon Warner MRN: [id number] DOB: 02-Aug-1951   Cancelled Treatment:    Reason Eval/Treat Not Completed: Patient at procedure or test/unavailable. Will check back at another time  As schedule allows.   Claretha Cooper 03/15/2016, 12:47 PM

## 2016-03-15 NOTE — Progress Notes (Signed)
CRITICAL VALUE ALERT  Critical value received:  Lactic acid 2.5  Date of notification:  03/15/2016  Time of notification:  0400  Critical value read back:Yes.    Nurse who received alert:  Reche Dixon  MD notified (1st page):  Tylene Fantasia (NP on call)  Time of first page:  0402  MD notified (2nd page):  Time of second page:  Responding MD:  Tylene Fantasia   Time MD responded:  0500

## 2016-03-15 NOTE — Plan of Care (Signed)
Problem: Food- and Nutrition-Related Knowledge Deficit (NB-1.1) Goal: Nutrition education Formal process to instruct or train a patient/client in a skill or to impart knowledge to help patients/clients voluntarily manage or modify food choices and eating behavior to maintain or improve health. Outcome: Completed/Met Date Met:  03/15/16  RD consulted for nutrition education regarding diabetes.     Lab Results  Component Value Date    HGBA1C 8.4* 03/14/2016    RD provided "Carbohydrate Counting for People with Diabetes" handout from the Academy of [employer] Dietetics. Discussed different food groups and their effects on blood sugar, emphasizing carbohydrate-containing foods. Provided list of carbohydrates and recommended serving sizes of common foods.  Discussed importance of controlled and consistent carbohydrate intake throughout the day. Provided examples of ways to balance meals/snacks and encouraged intake of high-fiber, whole grain complex carbohydrates. Teach back method used.  Expect fair compliance.  Body mass index is 47.85 kg/(m^2). Pt meets criteria for obese class III based on current BMI.  Current diet order is carb modified, patient is consuming approximately 25-50% of meals at this time. Labs and medications reviewed. No further nutrition interventions warranted at this time. RD contact information provided. If additional nutrition issues arise, please re-consult RD.  Sharon Anis. Kagen Kunath, MS, RD LDN Inpatient Clinical Dietitian Pager [phone]

## 2016-03-15 NOTE — Progress Notes (Signed)
PHARMACY - PHYSICIAN COMMUNICATION CRITICAL VALUE ALERT - BLOOD CULTURE IDENTIFICATION (BCID)  Results for orders placed or performed during the hospital encounter of 03/13/16  Blood Culture ID Panel (Reflexed) (Collected: 03/13/2016 11:35 PM)  Result Value Ref Range   Enterococcus species NOT DETECTED NOT DETECTED   Vancomycin resistance NOT DETECTED NOT DETECTED   Listeria monocytogenes NOT DETECTED NOT DETECTED   Staphylococcus species NOT DETECTED NOT DETECTED   Staphylococcus aureus NOT DETECTED NOT DETECTED   Methicillin resistance NOT DETECTED NOT DETECTED   Streptococcus species DETECTED (A) NOT DETECTED   Streptococcus agalactiae NOT DETECTED NOT DETECTED   Streptococcus pneumoniae NOT DETECTED NOT DETECTED   Streptococcus pyogenes NOT DETECTED NOT DETECTED   Acinetobacter baumannii NOT DETECTED NOT DETECTED   Enterobacteriaceae species NOT DETECTED NOT DETECTED   Enterobacter cloacae complex NOT DETECTED NOT DETECTED   Escherichia coli NOT DETECTED NOT DETECTED   Klebsiella oxytoca NOT DETECTED NOT DETECTED   Klebsiella pneumoniae NOT DETECTED NOT DETECTED   Proteus species NOT DETECTED NOT DETECTED   Serratia marcescens NOT DETECTED NOT DETECTED   Carbapenem resistance NOT DETECTED NOT DETECTED   Haemophilus influenzae NOT DETECTED NOT DETECTED   Neisseria meningitidis NOT DETECTED NOT DETECTED   Pseudomonas aeruginosa NOT DETECTED NOT DETECTED   Candida albicans NOT DETECTED NOT DETECTED   Candida glabrata NOT DETECTED NOT DETECTED   Candida krusei NOT DETECTED NOT DETECTED   Candida parapsilosis NOT DETECTED NOT DETECTED   Candida tropicalis NOT DETECTED NOT DETECTED   6/14 Anaerobic blood culture now growing gram variable rod and GPC in pairs (likely same streptococcus species as other blood culture)  Name of physician (or Provider) Contacted: None - Dr. Megan Salon already aware per his note 6/16   Changes to prescribed antibiotics required: None  Ralene Bathe,  PharmD, BCPS 03/15/2016, 12:13 PM  Pager: [phone]

## 2016-03-15 NOTE — Progress Notes (Addendum)
Inpatient Diabetes Program Recommendations  AACE/ADA: New Consensus Statement on Inpatient Glycemic Control (2015)  Target Ranges:  Prepandial:   less than 140 mg/dL      Peak postprandial:   less than 180 mg/dL (1-2 hours)      Critically ill patients:  140 - 180 mg/dL   Results for ASHONTA, RUFFALO (MRN [id number]) as of 03/15/2016 08:43  Ref. Range 03/14/2016 02:42  Hemoglobin A1C Latest Ref Range: 4.8-5.6 % 8.4 (H)   Results for GENESA, ARGENTO (MRN [id number]) as of 03/15/2016 08:43  Ref. Range 03/14/2016 08:27 03/14/2016 11:49 03/14/2016 16:59 03/14/2016 21:32  Glucose-Capillary Latest Ref Range: 65-99 mg/dL 278 (H) 222 (H) 166 (H) 183 (H)   Results for LANNA, XU (MRN [id number]) as of 03/15/2016 08:43  Ref. Range 03/15/2016 08:18  Glucose-Capillary Latest Ref Range: 65-99 mg/dL 240 (H)    Admit with: Infected decubitus ulcer causing sepsis  History: DM2  Home DM Meds: None-Diet controlled  Current Insulin Orders: Lantus 8 units daily      Novolog Resistant Correction Scale/ SSI (0-20 units) TID AC + HS      -Note current A1c= 8.4%.  Patient may likely need some type of diabetes medication at time of discharge.  May do well with oral diabetes medication regimen.  -Agree with insulin while hospitalized to help promote healing.  -Fasting glucose elevated again this AM.     MD- Please consider the following in-hospital insulin adjustments:  If fasting glucose elevated again tomorrow (06/17), please consider increasing Lantus to 15 units daily (~0.15 units/kg dosing)    Addendum 1PM: Spoke with patient this AM.  Patient told me she was diagnosed with DM about 1 month prior to coming to [facility].  Has not received any formal training for her DM.  Not taking any medications at home.  Would like to try try diet modification but is willing to take oral DM medications.  Not willing at all to take insulin b/c of her fear of needles.  Does not have CBG meter at home either.  Spoke with  pt about her diabetes.  Discussed A1C results with her and explained what an A1C is, basic pathophysiology of DM Type 2, basic home care, basic diabetes diet nutrition principles, importance of checking CBGs and maintaining good CBG control to prevent long-term and short-term complications.  Reviewed signs and symptoms of hyperglycemia and hypoglycemia and how to treat hypoglycemia at home.  Also reviewed blood sugar goals at home.    RNs to provide ongoing basic DM education at bedside with this patient.  Have ordered educational booklet and DM videos.  Have also placed RD consult for DM diet education for this patient.     --Will follow patient during hospitalization--  Wyn Quaker RN, MSN, CDE Diabetes Coordinator Inpatient Glycemic Control Team Team Pager: [phone] (8a-5p)

## 2016-03-15 NOTE — Progress Notes (Addendum)
Patient ID: Sharon Warner, female   DOB: 1950-11-14, 65 y.o.   MRN: [id number]         [facility]    Date of Admission:  03/13/2016   Total days of antibiotics 2         Principal Problem:   Infected decubitus ulcer Active Problems:   Severe sepsis (HCC)   AKI (acute kidney injury) ([place])   Lactic acidosis   Bacteremia   Paraplegia (HCC)   Morbid obesity (HCC)   Hypokalemia   Type 2 diabetes mellitus (HCC)   Multiple thyroid nodules   Enlarged thoracic aorta (HCC)   HTN (hypertension)   Pressure ulcer   Neurogenic bladder   Obstructive sleep apnea   Normocytic anemia   . antiseptic oral rinse  7 mL Mouth Rinse q12n4p  . aspirin EC  81 mg Oral Daily  . aztreonam  2 g Intravenous Q8H  . chlorhexidine  15 mL Mouth Rinse BID  . collagenase   Topical Daily  . enoxaparin (LOVENOX) injection  0.5 mg/kg Subcutaneous Q24H  . feeding supplement (ENSURE ENLIVE)  237 mL Oral TID BM  . feeding supplement (PRO-STAT SUGAR FREE 64)  30 mL Oral Daily  . insulin aspart  0-20 Units Subcutaneous TID WC  . insulin aspart  0-5 Units Subcutaneous QHS  . insulin glargine  8 Units Subcutaneous Daily  . metronidazole  500 mg Intravenous Q8H  . multivitamin with minerals  1 tablet Oral Daily  . polyethylene glycol  17 g Oral Daily  . vancomycin  1,250 mg Intravenous Q24H    SUBJECTIVE: She is feeling much better. She slept well last night.  Review of Systems: Review of Systems  Constitutional: Positive for malaise/fatigue. Negative for fever, chills and diaphoresis.    Past Medical History  Diagnosis Date  . Hypertension   . Diabetes mellitus without complication ([place])   . H/O paraplegia     ascending paralysis unclear etiology  . Chronic indwelling Foley catheter     last changed few days ago  . Morbid obesity ([place])   . Lumbar spondylosis   . OSA on CPAP     Social History  Substance Use Topics  . Smoking status: Never Smoker   . Smokeless  tobacco: None  . Alcohol Use: No    Family History  Problem Relation Age of Onset  . Hypothyroidism Mother   . Diabetes Maternal Grandmother   . Heart disease Maternal Grandfather    Allergies  Allergen Reactions  . Levaquin [Levofloxacin In D5w] Other (See Comments)    Unknown childhood allergy  . Penicillins Other (See Comments)    Unknown childhood allergy    OBJECTIVE: Filed Vitals:   03/15/16 0400 03/15/16 0500 03/15/16 0821 03/15/16 0838  BP: 113/61   119/54  Pulse: 106   118  Temp: 98 F (36.7 C)  98.3 F (36.8 C)   TempSrc: Axillary  Oral   Resp: 21   29  Height:      Weight:  253 lb 1.4 oz (114.8 kg)    SpO2: 98%   97%   Body mass index is 47.85 kg/(m^2).  Physical Exam  Constitutional: She is oriented to person, place, and time. No distress.  Musculoskeletal:  She has a large sacral pressure sore about 4-1/2 cm in diameter and about 4 cm deep. There is about 1 cm of undermining around most of the wound but 3-1/2 cm of undermining from about 2:00 to 6:00 position.  The base of the wound is just over bone. The base is covered in shaggy dark exudate. There is a moderate amount of yellow drainage with some malodor. Some of the surrounding skin is macerated. The entire area is insensate.  Neurological: She is alert and oriented to person, place, and time.  Psychiatric: Mood and affect normal.    Lab Results Lab Results  Component Value Date   WBC 15.1* 03/15/2016   HGB 11.0* 03/15/2016   HCT 33.2* 03/15/2016   MCV 81.2 03/15/2016   PLT 318 03/15/2016    Lab Results  Component Value Date   CREATININE 1.00 03/15/2016   BUN 23* 03/15/2016   NA 135 03/15/2016   K 3.8 03/15/2016   CL 107 03/15/2016   CO2 19* 03/15/2016    Lab Results  Component Value Date   ALT 16 03/15/2016   AST 21 03/15/2016   ALKPHOS 51 03/15/2016   BILITOT 0.5 03/15/2016     Microbiology: Recent Results (from the past 240 hour(s))  Wound or Superficial Culture     Status: None  (Preliminary result)   Collection Time: 03/13/16 10:38 PM  Result Value Ref Range Status   Specimen Description DECUBITIS  Final   Special Requests Normal  Final   Gram Stain   Final    RARE WBC PRESENT, PREDOMINANTLY PMN MODERATE GRAM POSITIVE COCCI IN PAIRS FEW GRAM VARIABLE ROD Performed at [facility]    Culture PENDING  Incomplete   Report Status PENDING  Incomplete  Urine culture     Status: Abnormal   Collection Time: 03/13/16 10:42 PM  Result Value Ref Range Status   Specimen Description URINE, CATHETERIZED  Final   Special Requests NONE  Final   Culture MULTIPLE SPECIES PRESENT, SUGGEST RECOLLECTION (A)  Final   Report Status 03/15/2016 FINAL  Final  Blood Culture (routine x 2)     Status: None (Preliminary result)   Collection Time: 03/13/16 10:49 PM  Result Value Ref Range Status   Specimen Description LEFT ANTECUBITAL  Final   Special Requests BOTTLES DRAWN AEROBIC AND ANAEROBIC 5CC  Final   Culture  Setup Time   Final    GRAM NEGATIVE RODS GRAM POSITIVE COCCI IN PAIRS ANAEROBIC BOTTLE ONLY Organism ID to follow CRITICAL RESULT CALLED TO, READ BACK BY AND VERIFIED WITH: BETH GREEN,PHARMD @0135  03/15/16 MKELLY GRAM NEGATIVE RODS AEROBIC BOTTLE ONLY    Culture   Final    CULTURE REINCUBATED FOR BETTER GROWTH Performed at [facility]    Report Status PENDING  Incomplete  Blood Culture ID Panel (Reflexed)     Status: Abnormal   Collection Time: 03/13/16 10:49 PM  Result Value Ref Range Status   Enterococcus species NOT DETECTED NOT DETECTED Final   Vancomycin resistance NOT DETECTED NOT DETECTED Final   Listeria monocytogenes NOT DETECTED NOT DETECTED Final   Staphylococcus species NOT DETECTED NOT DETECTED Final   Staphylococcus aureus NOT DETECTED NOT DETECTED Final   Methicillin resistance NOT DETECTED NOT DETECTED Final   Streptococcus species DETECTED (A) NOT DETECTED Final    Comment: CRITICAL RESULT CALLED TO, READ BACK BY AND VERIFIED  WITH: BETH GREEN, PHARMD @0135  03/15/16 MKELLY    Streptococcus agalactiae NOT DETECTED NOT DETECTED Final   Streptococcus pneumoniae NOT DETECTED NOT DETECTED Final   Streptococcus pyogenes NOT DETECTED NOT DETECTED Final   Acinetobacter baumannii NOT DETECTED NOT DETECTED Final   Enterobacteriaceae species DETECTED (A) NOT DETECTED Final    Comment: CRITICAL RESULT CALLED  TO, READ BACK BY AND VERIFIED WITH: BETH GREEN, PHARMD @0135  03/15/16 MKELLY    Enterobacter cloacae complex NOT DETECTED NOT DETECTED Final   Escherichia coli DETECTED (A) NOT DETECTED Final    Comment: CRITICAL RESULT CALLED TO, READ BACK BY AND VERIFIED WITH: BETH GREEN, PHARMD @0135  03/15/16 MKELLY    Klebsiella oxytoca NOT DETECTED NOT DETECTED Final   Klebsiella pneumoniae NOT DETECTED NOT DETECTED Final   Proteus species NOT DETECTED NOT DETECTED Final   Serratia marcescens NOT DETECTED NOT DETECTED Final   Carbapenem resistance NOT DETECTED NOT DETECTED Final   Haemophilus influenzae NOT DETECTED NOT DETECTED Final   Neisseria meningitidis NOT DETECTED NOT DETECTED Final   Pseudomonas aeruginosa NOT DETECTED NOT DETECTED Final   Candida albicans NOT DETECTED NOT DETECTED Final   Candida glabrata NOT DETECTED NOT DETECTED Final   Candida krusei NOT DETECTED NOT DETECTED Final   Candida parapsilosis NOT DETECTED NOT DETECTED Final   Candida tropicalis NOT DETECTED NOT DETECTED Final  Blood Culture (routine x 2)     Status: None (Preliminary result)   Collection Time: 03/13/16 11:35 PM  Result Value Ref Range Status   Specimen Description BLOOD RIGHT FOREARM  Final   Special Requests BOTTLES DRAWN AEROBIC AND ANAEROBIC 5CC  Final   Culture  Setup Time   Final    GRAM VARIABLE ROD ANAEROBIC BOTTLE ONLY Organism ID to follow Performed at [facility] ROD  Final   Report Status PENDING  Incomplete  MRSA PCR Screening     Status: None   Collection Time: 03/14/16  1:58 AM    Result Value Ref Range Status   MRSA by PCR NEGATIVE NEGATIVE Final    Comment:        The GeneXpert MRSA Assay (FDA approved for NASAL specimens only), is one component of a comprehensive MRSA colonization surveillance program. It is not intended to diagnose MRSA infection nor to guide or monitor treatment for MRSA infections.      ASSESSMENT: She has a large infected sacral wound complicated by polymicrobial bacteremia with Escherichia coli, strep species and a gram variable rod. She is improving on broad empiric antibiotic therapy. I will Consolidate antibiotic therapy to ertapenem alone. She is having problems with IV access. I will order PICC placement. She is scheduled for [facility] at [facility] later this afternoon.  PLAN: 1. Change antibiotics to IV ertapenem 2. Await final blood culture results 3. PICC placement 4. Await MRI results 5. I will have my partner, Dr. Carlyle Basques [phone]) monitor culture results and make any antibiotic adjustments needed this weekend. I will follow-up on Monday.  Michel Bickers, MD [facility] [phone] pager   [phone] cell 03/15/2016, 10:54 AM

## 2016-03-16 DIAGNOSIS — M4628 Osteomyelitis of vertebra, sacral and sacrococcygeal region: Secondary | ICD-10-CM | POA: Diagnosis present

## 2016-03-16 DIAGNOSIS — K6289 Other specified diseases of anus and rectum: Secondary | ICD-10-CM | POA: Diagnosis present

## 2016-03-16 DIAGNOSIS — I7789 Other specified disorders of arteries and arterioles: Secondary | ICD-10-CM

## 2016-03-16 LAB — MAGNESIUM: MAGNESIUM: 1.7 mg/dL (ref 1.7–2.4)

## 2016-03-16 LAB — BASIC METABOLIC PANEL
Anion gap: 7 (ref 5–15)
BUN: 21 mg/dL — AB (ref 6–20)
CALCIUM: 9.6 mg/dL (ref 8.9–10.3)
CO2: 23 mmol/L (ref 22–32)
CREATININE: 0.8 mg/dL (ref 0.44–1.00)
Chloride: 107 mmol/L (ref 101–111)
GFR calc non Af Amer: >60 mL/min (ref >60)
Glucose, Bld: 217 mg/dL — ABNORMAL HIGH (ref 65–99)
Potassium: 3.3 mmol/L — ABNORMAL LOW (ref 3.5–5.1)
SODIUM: 137 mmol/L (ref 135–145)

## 2016-03-16 LAB — CBC WITH DIFFERENTIAL/PLATELET
BASOS PCT: 0 %
Basophils Absolute: 0 10*3/uL (ref 0.0–0.1)
EOS ABS: 0.2 10*3/uL (ref 0.0–0.7)
Eosinophils Relative: 2 %
HCT: 32.6 % — ABNORMAL LOW (ref 36.0–46.0)
Hemoglobin: 10.4 g/dL — ABNORMAL LOW (ref 12.0–15.0)
Lymphocytes Relative: 30 %
Lymphs Abs: 3.5 10*3/uL (ref 0.7–4.0)
MCH: 26.9 pg (ref 26.0–34.0)
MCHC: 31.9 g/dL (ref 30.0–36.0)
MCV: 84.2 fL (ref 78.0–100.0)
MONO ABS: 0.7 10*3/uL (ref 0.1–1.0)
MONOS PCT: 6 %
Neutro Abs: 7.2 10*3/uL (ref 1.7–7.7)
Neutrophils Relative %: 62 %
Platelets: 297 10*3/uL (ref 150–400)
RBC: 3.87 MIL/uL (ref 3.87–5.11)
RDW: 15.3 % (ref 11.5–15.5)
WBC: 11.6 10*3/uL — ABNORMAL HIGH (ref 4.0–10.5)

## 2016-03-16 LAB — GLUCOSE, CAPILLARY
GLUCOSE-CAPILLARY: 194 mg/dL — AB (ref 65–99)
Glucose-Capillary: 234 mg/dL — ABNORMAL HIGH (ref 65–99)
Glucose-Capillary: 155 mg/dL — ABNORMAL HIGH (ref 65–99)
Glucose-Capillary: 208 mg/dL — ABNORMAL HIGH (ref 65–99)

## 2016-03-16 MED ORDER — PHYTONADIONE 5 MG PO TABS: 2.5000 mg | ORAL_TABLET | Freq: Every day | ORAL | Status: DC

## 2016-03-16 MED ORDER — SODIUM CHLORIDE 0.9 % IV SOLN
1000.0000 mL | INTRAVENOUS | Status: DC
  Administered 2016-03-17 – 2016-03-18 (×2): 1000 mL via INTRAVENOUS

## 2016-03-16 MED ORDER — PHYTONADIONE 5 MG PO TABS
2.5000 mg | ORAL_TABLET | Freq: Once | ORAL | Status: AC
  Administered 2016-03-16: 2.5 mg via ORAL
  Filled 2016-03-16: qty 1

## 2016-03-16 MED ORDER — POTASSIUM CHLORIDE CRYS ER 20 MEQ PO TBCR
40.0000 meq | EXTENDED_RELEASE_TABLET | ORAL | Status: AC
  Administered 2016-03-16 (×2): 40 meq via ORAL
  Filled 2016-03-16 (×2): qty 2

## 2016-03-16 MED ORDER — INSULIN GLARGINE 100 UNIT/ML ~~LOC~~ SOLN
12.0000 [IU] | Freq: Every day | SUBCUTANEOUS | Status: DC
  Administered 2016-03-16: 12 [IU] via SUBCUTANEOUS
  Filled 2016-03-16 (×2): qty 0.12

## 2016-03-16 NOTE — Progress Notes (Signed)
Demonstrated and explained process and technique of how to check cbg with the hospital meter to pt. Pt explained technique back to me but is still nervous about sticking herself. She stated she has fear of needles. I encouraged pt to participate with checking her cbg in the am at breakfast time.  Rosie Fate

## 2016-03-16 NOTE — Evaluation (Signed)
Physical Therapy Evaluation Patient Details Name: Sharon Warner MRN: [id number] DOB: 09-27-1951 Today's Date: 03/16/2016   History of Present Illness  65 yo female adm 03/15/16 with sacral wound-->sepsis; PMHx:  paraplegic x69yr, OSA, DM  Clinical Impression  Patient evaluated by Physical Therapy with no further acute PT needs identified. All education has been completed and the patient has no further questions. See below for any follow-up Physical Therapy or equipment needs. PT is signing off. Thank you for this referral. Instructed pt in PROM and importance of as well as positioning in bed with neutral hips and avoiding "frog leg"  Position; encouraged pt to have her caregivers perform PROM to LEs when doing ADLS so that it is done everyday; PROM is not a skilled service and therefore we will not follow this pt; she is dependent with mobility at her baseline; I have reviewed this with pt; recommend the NT perform PROM while she is in hospital, have provided pt with a h/o     Follow Up Recommendations No PT follow up    Equipment Recommendations       Recommendations for Other Services       Precautions / Restrictions Precautions Precautions: Fall Restrictions Weight Bearing Restrictions: No      Mobility  Bed Mobility               General bed mobility comments: NT  Transfers                    Ambulation/Gait                Stairs            Wheelchair Mobility    Modified Rankin (Stroke Patients Only)       Balance                                             Pertinent Vitals/Pain Pain Assessment: No/denies pain    Home Living Family/patient expects to be discharged to:: Private residence Living Arrangements: Spouse/significant other Available Help at Discharge: Personal care attendant ("most of day"   7days per week) Type of Home:  (condo) Home Access: Level entry;Elevator     Home Layout: One level Home  Equipment: Wheelchair - manual;Other (comment) (hoyer) Additional Comments: pt is dependent with transfers/bed mobility, uses hoyer lift for OOB    Prior Function Level of Independence: Needs assistance   Gait / Transfers Assistance Needed: non-ambulatory/paraplegic           Hand Dominance        Extremity/Trunk Assessment               Lower Extremity Assessment: RLE deficits/detail;LLE deficits/detail RLE Deficits / Details: paraplegic LLE Deficits / Details: paraplegic     Communication   Communication: No difficulties  Cognition Arousal/Alertness: Awake/alert Behavior During Therapy: WFL for tasks assessed/performed Overall Cognitive Status: Within Functional Limits for tasks assessed                      General Comments      Exercises General Exercises - Lower Extremity Ankle Circles/Pumps: PROM;Both;5 reps Gluteal Sets: Both Heel Slides: PROM;Both;5 reps Hip ABduction/ADduction: PROM      Assessment/Plan    PT Assessment All further PT needs can be met in the next venue of care  PT Diagnosis Other (  comment) (paraplegia)   PT Problem List    PT Treatment Interventions     PT Goals (Current goals can be found in the Care Plan section) Acute Rehab PT Goals Patient Stated Goal: none stated PT Goal Formulation: All assessment and education complete, DC therapy    Frequency     Barriers to discharge        Co-evaluation               End of Session   Activity Tolerance: Patient tolerated treatment well Patient left: in bed;with call bell/phone within reach Nurse Communication: Mobility status         Time: 1040-1058 PT Time Calculation (min) (ACUTE ONLY): 18 min   Charges:   PT Evaluation $PT Eval Low Complexity: 1 Procedure     PT G Codes:        Winona Sison Apr 01, 2016, 11:58 AM

## 2016-03-16 NOTE — Progress Notes (Signed)
Pt was awake and lying in bed,on the phone when I arrived but motioned for me to come in. She said she was doing ok and did not need anything at the time. Please page if additional support is needed. Chaplain Ernest Haber, M.Div.   03/16/16 1900  Clinical Encounter Type  Visited With Patient

## 2016-03-16 NOTE — Progress Notes (Signed)
[facility] Progress Note  Patient: Sharon Warner [id number]   PCP: [name], MD DOB: May 06, 1951   DOA: 03/13/2016   DOS: 03/16/2016   Date of Service: the patient was seen and examined on 03/16/2016  Subjective: pt denies any vomiting or nausea or abdominal pain, shortness of breath. No fever chills or chest pain. Nutrition: Tolerating oral diet  Brief hospital course: Pt. with PMH of morbid obesity, OSA, type 2 diabetes mellitus, not on any medications, HTN, chronic paraplegia, chronic indwelling Foley catheter, chronic sacral ulcer; admitted on 03/13/2016, with complaint of generalized weakness and fatigue, was found to have sepsis from sacral ulcer infection with osteomyelitis. Currently further plan is continue IV antibiotics.  Assessment and Plan: 1. 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 Wound care consultation appreciated. ID consultation appreciated as well. Patient started on IV vancomycin, aztreonam, Flagyl. Now transitioned to [facility] by ID. Blood culture 03/13/2016 grew Escherichia coli and Streptococcus.  Repeat blood culture on 03/15/2016 no growth to date. MRI positive for sacral osteomyelitis without any abscess also shows proctitis probably associated with same infection. Lactic acid is improving. CK is normal, ESR is 95 Leukocytosis is improving. Continue IV antibiotics, PICC line after 48 hours of culture clearance. Transferred to telemetry.  2. Type 2 diabetes mellitus. Hemoglobin A1c 8.4. Currently we will continue with insulin Lantus as well as sliding scale coverage.  Increase Lantus to 12 units Sugars appears to be well controlled.  3. Objective sleep apnea. Morbid obesity. Continue CPAP daily at bedtime.  4. Sick euthyroid syndrome. Mild elevation of serum T4. Currently no indication for treatment at present.  5. Acute kidney injury. Serum creatinine is significantly improving. We will continue with gentle  IV hydration. Reduce IV fluid rate  6. Essential hypertension. Blood pressure stable. Continue to monitor  7. Chronic paraplegia. Neurogenic bladder. Chronic indwelling Foley catheter. Foley catheter changed on 03/14/2016 in ER. Patient will need neurology follow-up as an outpatient to establish care. At present no workup since no further worsening.  8. Constipation. Currently resolved. We will continue MiraLAX daily. Change Senokot 2 twice a day as needed.  9. Hypokalemia. Replacing orally. Hyponatremia. Probably from dehydration as well as poor oral intake, Significantly improved with IV hydration. Daily BMP.  Pain management: When necessary Tylenol Activity: Consulted physical therapy Bowel regimen: last BM 03/15/2016 Diet: Regular diet carb modified DVT Prophylaxis: subcutaneous Heparin  Advance goals of care discussion: Full code  Family Communication: no family was present at bedside, at the time of interview.   Disposition:  Discharge pending physical therapy evaluation. Expected discharge date: 03/18/2016, blood culture clearance and PICC line insertion  Consultants: Infectious disease Procedures: None  Antibiotics: Anti-infectives    Start     Dose/Rate Route Frequency Ordered Stop   03/15/16 1200  ertapenem (INVANZ) 1 g in sodium chloride 0.9 % 50 mL IVPB     1 g 100 mL/hr over 30 Minutes Intravenous Every 24 hours 03/15/16 1105     03/15/16 0600  vancomycin (VANCOCIN) 1,250 mg in sodium chloride 0.9 % 250 mL IVPB  Status:  Discontinued     1,250 mg 166.7 mL/hr over 90 Minutes Intravenous Every 24 hours 03/14/16 0234 03/15/16 1105   03/14/16 0800  metroNIDAZOLE (FLAGYL) IVPB 500 mg  Status:  Discontinued    Comments:  Per pharmacy consult   500 mg 100 mL/hr over 60 Minutes Intravenous Every 8 hours 03/14/16 0215 03/15/16 1105   03/14/16 0800  aztreonam (AZACTAM) 2 g in  dextrose 5 % 50 mL IVPB  Status:  Discontinued     2 g 100 mL/hr over 30 Minutes  Intravenous Every 8 hours 03/14/16 0226 03/15/16 1105   03/14/16 0300  vancomycin (VANCOCIN) 2,500 mg in sodium chloride 0.9 % 500 mL IVPB     2,500 mg 250 mL/hr over 120 Minutes Intravenous  Once 03/14/16 0229 03/14/16 0500   03/13/16 2330  metroNIDAZOLE (FLAGYL) IVPB 500 mg     500 mg 100 mL/hr over 60 Minutes Intravenous  Once 03/13/16 2257 03/14/16 0112   03/13/16 2300  aztreonam (AZACTAM) 2 g in dextrose 5 % 50 mL IVPB     2 g 100 mL/hr over 30 Minutes Intravenous STAT 03/13/16 2257 03/14/16 0013        Intake/Output Summary (Last 24 hours) at 03/16/16 1011 Last data filed at 03/16/16 0900  Gross per 24 hour  Intake   3522 ml  Output   2445 ml  Net   1077 ml   Filed Weights   03/14/16 0200 03/15/16 0500 03/16/16 0500  Weight: 113.8 kg (250 lb 14.1 oz) 114.8 kg (253 lb 1.4 oz) 118.3 kg (260 lb 12.9 oz)    Objective: Physical Exam: Filed Vitals:   03/16/16 0500 03/16/16 0630 03/16/16 0800 03/16/16 0900  BP:   116/57   Pulse:  103  97  Temp:   97.9 F (36.6 C)   TempSrc:   Oral   Resp:  '19 30 25  ' Height:      Weight: 118.3 kg (260 lb 12.9 oz)     SpO2:  92%  96%   General: Alert, Awake and Oriented to Time, Place and Person. Appear in mild distress Eyes: PERRL, Conjunctiva normal ENT: Oral Mucosa clear moist. Neck: difficult to assess JVD, no Abnormal Mass Or lumps Cardiovascular: S1 and S2 Present, no Murmur, Respiratory: Bilateral Air entry equal and Decreased, Clear to Auscultation, no Crackles, no wheezes Abdomen: Bowel Sound present, Soft and no tenderness Skin: Stage IV sacral ulcer Extremities: trace Pedal edema, no calf tenderness Neurologic: Grossly no focal neuro deficit. Bilaterally Equal motor strength chronic paraplegia bilaterally  Data Reviewed: CBC:  Recent Labs Lab 03/13/16 2247 03/14/16 0242 03/14/16 0512 03/15/16 0309 03/16/16 0309  WBC 23.5* 29.9* 33.0* 15.1* 11.6*  NEUTROABS 21.1*  --   --  11.3* 7.2  HGB 13.3 11.9* 11.5* 11.0*  10.4*  HCT 40.6 37.1 35.5* 33.2* 32.6*  MCV 82.9 84.1 83.3 81.2 84.2  PLT 452* 366 341 318 656   Basic Metabolic Panel:  Recent Labs Lab 03/13/16 2247 03/14/16 0242 03/14/16 0512 03/14/16 1353 03/15/16 0309 03/16/16 0309  NA 130*  --  129*  --  135 137  K 2.9*  --  2.9* 3.9 3.8 3.3*  CL 94*  --  97*  --  107 107  CO2 20*  --  20*  --  19* 23  GLUCOSE 285*  --  324*  --  233* 217*  BUN 22*  --  23*  --  23* 21*  CREATININE 1.35* 1.43* 1.34*  --  1.00 0.80  CALCIUM 10.4*  --  9.4  --  9.3 9.6  MG  --   --   --   --  1.8 1.7    Liver Function Tests:  Recent Labs Lab 03/13/16 2247 03/15/16 0309  AST 39 21  ALT 21 16  ALKPHOS 88 51  BILITOT 1.1 0.5  PROT 7.5 6.1*  ALBUMIN 3.2* 2.5*   No  results for input(s): LIPASE, AMYLASE in the last 168 hours. No results for input(s): AMMONIA in the last 168 hours. Coagulation Profile:  Recent Labs Lab 03/14/16 0242 03/15/16 0309  INR 1.53* 1.53*   Cardiac Enzymes:  Recent Labs Lab 03/13/16 2247 03/15/16 0309  CKTOTAL  --  113  TROPONINI 0.03  --    BNP (last 3 results) No results for input(s): PROBNP in the last 8760 hours.  CBG:  Recent Labs Lab 03/15/16 0818 03/15/16 1242 03/15/16 1813 03/15/16 2133 03/16/16 0814  GLUCAP 240* 247* 192* 191* 194*    Studies: Mr Sacrum/si Joints Wo Contrast  03/15/2016  CLINICAL DATA:  Sacral decubitus ulcer. Question abscess or osteomyelitis. EXAM: MR SACRUM WITHOUT CONTRAST TECHNIQUE: Multiplanar, multisequence MR imaging was performed. No intravenous contrast was administered. COMPARISON:  Plain films of the sacrum 03/13/2016. FINDINGS: A large sacral decubitus ulcer is identified. Skin defect extends to bone and the coccyx appears exposed. Mild marrow edema is seen in the distal most sacrum and coccyx worrisome for osteomyelitis. No abscess is identified. Subcutaneous tissues about the patient's ulcer are edematous worrisome for cellulitis. Note is made the patient is  status post fixation of bilateral hip fractures. Imaged intrapelvic contents demonstrate a thickened appearance of the walls of the rectosigmoid colon worrisome for proctitis. IMPRESSION: Large sacral decubitus ulcer extends to bone. Low level edema is seen in the distal sacrum and coccyx is compatible with osteomyelitis. Negative for abscess. Thickened appearance of the walls of the rectosigmoid colon worrisome for proctitis. Electronically Signed   By: Inge Rise M.D.   On: 03/15/2016 18:06     Scheduled Meds: . antiseptic oral rinse  7 mL Mouth Rinse q12n4p  . aspirin EC  81 mg Oral Daily  . chlorhexidine  15 mL Mouth Rinse BID  . collagenase   Topical Daily  . enoxaparin (LOVENOX) injection  0.5 mg/kg Subcutaneous Q24H  . ertapenem  1 g Intravenous Q24H  . feeding supplement (ENSURE ENLIVE)  237 mL Oral TID BM  . feeding supplement (PRO-STAT SUGAR FREE 64)  30 mL Oral Daily  . insulin aspart  0-20 Units Subcutaneous TID WC  . insulin aspart  0-5 Units Subcutaneous QHS  . insulin glargine  12 Units Subcutaneous Daily  . multivitamin with minerals  1 tablet Oral Daily  . polyethylene glycol  17 g Oral Daily  . potassium chloride  40 mEq Oral Q2H   Continuous Infusions: . sodium chloride 1,000 mL (03/16/16 0647)   PRN Meds: acetaminophen **OR** acetaminophen, bisacodyl, ondansetron **OR** ondansetron (ZOFRAN) IV, senna-docusate  Time spent: 30 minutes  Author: Berle Mull, MD [facility] Pager: [phone] 03/16/2016 10:11 AM  If 7PM-7AM, please contact night-coverage at [email or web address], password [email or web address]

## 2016-03-16 NOTE — Progress Notes (Signed)
RT placed patient on CPAP. Patient is on home setting of 14 cmH2O. 4 liters oxygen bleed into tubing per home regimine. Sterile water added to water chamber for humidification. Patient is tolerating well. RT will continue to monitor.

## 2016-03-17 DIAGNOSIS — R197 Diarrhea, unspecified: Secondary | ICD-10-CM | POA: Diagnosis present

## 2016-03-17 LAB — BASIC METABOLIC PANEL
ANION GAP: 8 (ref 5–15)
BUN: 19 mg/dL (ref 6–20)
CALCIUM: 10 mg/dL (ref 8.9–10.3)
CO2: 20 mmol/L — ABNORMAL LOW (ref 22–32)
Chloride: 109 mmol/L (ref 101–111)
Creatinine, Ser: 0.7 mg/dL (ref 0.44–1.00)
GLUCOSE: 169 mg/dL — AB (ref 65–99)
POTASSIUM: 4 mmol/L (ref 3.5–5.1)
SODIUM: 137 mmol/L (ref 135–145)

## 2016-03-17 LAB — CBC
HCT: 33.2 % — ABNORMAL LOW (ref 36.0–46.0)
HEMOGLOBIN: 10.5 g/dL — AB (ref 12.0–15.0)
MCH: 26.8 pg (ref 26.0–34.0)
MCHC: 31.6 g/dL (ref 30.0–36.0)
MCV: 84.7 fL (ref 78.0–100.0)
Platelets: 316 10*3/uL (ref 150–400)
RBC: 3.92 MIL/uL (ref 3.87–5.11)
RDW: 15.5 % (ref 11.5–15.5)
WBC: 12.5 10*3/uL — AB (ref 4.0–10.5)

## 2016-03-17 LAB — AEROBIC CULTURE W GRAM STAIN (SUPERFICIAL SPECIMEN): Special Requests: NORMAL

## 2016-03-17 LAB — GLUCOSE, CAPILLARY
GLUCOSE-CAPILLARY: 157 mg/dL — AB (ref 65–99)
GLUCOSE-CAPILLARY: 181 mg/dL — AB (ref 65–99)
GLUCOSE-CAPILLARY: 145 mg/dL — AB (ref 65–99)
Glucose-Capillary: 124 mg/dL — ABNORMAL HIGH (ref 65–99)

## 2016-03-17 LAB — AEROBIC CULTURE  (SUPERFICIAL SPECIMEN)

## 2016-03-17 MED ORDER — INSULIN GLARGINE 100 UNIT/ML ~~LOC~~ SOLN
16.0000 [IU] | Freq: Every day | SUBCUTANEOUS | Status: DC
Start: 2016-03-17 — End: 2016-03-21
  Administered 2016-03-17 – 2016-03-21 (×5): 16 [IU] via SUBCUTANEOUS
  Filled 2016-03-17 (×5): qty 0.16

## 2016-03-17 MED ORDER — SACCHAROMYCES BOULARDII 250 MG PO CAPS
250.0000 mg | ORAL_CAPSULE | Freq: Two times a day (BID) | ORAL | Status: DC
  Administered 2016-03-17 – 2016-03-21 (×9): 250 mg via ORAL
  Filled 2016-03-17 (×10): qty 1

## 2016-03-17 NOTE — Progress Notes (Addendum)
[facility] Progress Note  Patient: Sharon Warner OIB:[id number]   PCP: [name], MD DOB: 1951/06/11   DOA: 03/13/2016   DOS: 03/17/2016   Date of Service: the patient was seen and examined on 03/17/2016  Subjective: The patient denies any complaint. Has been having incontinence as she cannot control her stool since she has been paralyzed. Has watery diarrhea. No abdominal pain and chest pain. Nutrition: Tolerating oral diet  Brief hospital course: Pt. with PMH of morbid obesity, OSA, type 2 diabetes mellitus, not on any medications, HTN, chronic paraplegia, chronic indwelling Foley catheter, chronic sacral ulcer; admitted on 03/13/2016, with complaint of generalized weakness and fatigue.Patient primarily in [place] resident and came to [facility], and directly visited ER, patient has seen her regular doctor 1 month ago and has chronic wound for which she has been receiving home health at home. Patient was found to have sepsis from sacral ulcer infection with osteomyelitis. The CCM was consulted due to severe sepsis on admission, patient was aggressively resuscitated but did not require pressors or CCM follow-up. Infectious disease was consulted and patient's initial broad-spectrum antibiotics were consolidated in [place]. Blood cultures were positive and repeat blood cultures are showing no growth to date. PICC line will be inserted. MRI is positive for sacral osteomyelitis. Patient was diagnosed with diabetes mellitus with hemoglobin A1c of 8.4 and mentions that she has never used any medications for diabetes and was using diet to control it. Acute kidney injury improved with IV hydration.  Currently further plan is continue IV antibiotics, await final recommendation for wound care as well as IV, PICC line.  Assessment and Plan: 1. 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 Wound care consultation appreciated. ID consultation appreciated as well. Patient started  on IV vancomycin, aztreonam, Flagyl. Now transitioned to [facility] by ID. Blood culture 03/13/2016 grew Escherichia coli and Streptococcus.  Repeat blood culture on 03/15/2016 no growth to date. MRI positive for sacral osteomyelitis without any abscess also shows proctitis probably associated with same infection or constipation which was present prior to arrival. Lactic acid is improving. CK is normal, ESR is 95 Leukocytosis is improving. Continue IV antibiotics, PICC line after 48 hours of culture clearance. Need to discuss with wound care regarding need for debridement. Patient will need wound care follow-up as an outpatient. Case management consulted for home health RN for wound management. Also will need final disposition from ID regarding home antibiotics. Physical therapy feel that patient can safely be discharged home.  2. Type 2 diabetes mellitus. Hemoglobin A1c 8.4. Currently we will continue with insulin Lantus as well as sliding scale coverage.  Increase Lantus to 16 units, while the patient is afraid of using needles and is not in favor of using insulin at home at present given the wound as well as degree of hyperglycemia I feel that the patient might require insulin on discharge. Sugars appears to be well controlled.  3. Objective sleep apnea. Morbid obesity. Continue CPAP daily at bedtime.  4. Sick euthyroid syndrome. Mild elevation of serum T4. Currently no indication for treatment at present.  5. Acute kidney injury. Resolved We will continue with gentle IV hydration. Reduce IV fluid rate  6. Essential hypertension. Blood pressure stable. Continue to monitor  7. Chronic paraplegia. Neurogenic bladder. Chronic indwelling Foley catheter. Foley catheter changed on 03/14/2016 in ER. Patient will need neurology follow-up as an outpatient to establish care. At present no workup since no further worsening.  8. Constipation., Now diarrhea Currently resolved.  Discontinue  stool softener, add probiotics  9. Hypokalemia. Replacing orally. Hyponatremia. Probably from dehydration as well as poor oral intake, Significantly improved with IV hydration. Daily BMP.  Pain management: When necessary Tylenol Activity: Consulted physical therapy recommends no therapy requirement on discharge Bowel regimen: last BM 03/17/2016 Diet: Regular diet carb modified DVT Prophylaxis: subcutaneous Heparin  Advance goals of care discussion: Full code  Family Communication: no family was present at bedside, at the time of interview.   Disposition:  Discharge home with home health RN for IV antibiotics as well as wound care Expected discharge date: 03/18/2016, blood culture clearance and PICC line insertion  Consultants: Infectious disease Procedures: None  Antibiotics: Anti-infectives    Start     Dose/Rate Route Frequency Ordered Stop   03/15/16 1200  ertapenem (INVANZ) 1 g in sodium chloride 0.9 % 50 mL IVPB     1 g 100 mL/hr over 30 Minutes Intravenous Every 24 hours 03/15/16 1105     03/15/16 0600  vancomycin (VANCOCIN) 1,250 mg in sodium chloride 0.9 % 250 mL IVPB  Status:  Discontinued     1,250 mg 166.7 mL/hr over 90 Minutes Intravenous Every 24 hours 03/14/16 0234 03/15/16 1105   03/14/16 0800  metroNIDAZOLE (FLAGYL) IVPB 500 mg  Status:  Discontinued    Comments:  Per pharmacy consult   500 mg 100 mL/hr over 60 Minutes Intravenous Every 8 hours 03/14/16 0215 03/15/16 1105   03/14/16 0800  aztreonam (AZACTAM) 2 g in dextrose 5 % 50 mL IVPB  Status:  Discontinued     2 g 100 mL/hr over 30 Minutes Intravenous Every 8 hours 03/14/16 0226 03/15/16 1105   03/14/16 0300  vancomycin (VANCOCIN) 2,500 mg in sodium chloride 0.9 % 500 mL IVPB     2,500 mg 250 mL/hr over 120 Minutes Intravenous  Once 03/14/16 0229 03/14/16 0500   03/13/16 2330  metroNIDAZOLE (FLAGYL) IVPB 500 mg     500 mg 100 mL/hr over 60 Minutes Intravenous  Once 03/13/16 2257 03/14/16 0112    03/13/16 2300  aztreonam (AZACTAM) 2 g in dextrose 5 % 50 mL IVPB     2 g 100 mL/hr over 30 Minutes Intravenous STAT 03/13/16 2257 03/14/16 0013        Intake/Output Summary (Last 24 hours) at 03/17/16 0814 Last data filed at 03/17/16 0600  Gross per 24 hour  Intake 2048.75 ml  Output   2330 ml  Net -281.25 ml   Filed Weights   03/15/16 0500 03/16/16 0500 03/17/16 0454  Weight: 114.8 kg (253 lb 1.4 oz) 118.3 kg (260 lb 12.9 oz) 122.8 kg (270 lb 11.6 oz)    Objective: Physical Exam: Filed Vitals:   03/16/16 1455 03/16/16 2121 03/17/16 0454 03/17/16 0500  BP: 127/76 136/73  128/68  Pulse: 92 114  111  Temp: 97.6 F (36.4 C) 98.3 F (36.8 C)  98.3 F (36.8 C)  TempSrc: Oral Oral  Oral  Resp: _0 Height:      Weight:   122.8 kg (270 lb 11.6 oz)   SpO2: 98% 97%  97%   General: Alert, Awake and Oriented to Time, Place and Person. Appear in mild distress Eyes: PERRL, Conjunctiva normal ENT: Oral Mucosa clear moist. Neck: difficult to assess JVD, no Abnormal Mass Or lumps Cardiovascular: S1 and S2 Present, no Murmur, Respiratory: Bilateral Air entry equal and Decreased, Clear to Auscultation, no Crackles, no wheezes Abdomen: Bowel Sound present, Soft and no tenderness Skin: Stage  IV sacral ulcer Extremities: trace Pedal edema, no calf tenderness Neurologic: Grossly no focal neuro deficit. Bilaterally Equal motor strength chronic paraplegia bilaterally  Data Reviewed: CBC:  Recent Labs Lab 03/13/16 2247 03/14/16 0242 03/14/16 0512 03/15/16 0309 03/16/16 0309 03/17/16 0523  WBC 23.5* 29.9* 33.0* 15.1* 11.6* 12.5*  NEUTROABS 21.1*  --   --  11.3* 7.2  --   HGB 13.3 11.9* 11.5* 11.0* 10.4* 10.5*  HCT 40.6 37.1 35.5* 33.2* 32.6* 33.2*  MCV 82.9 84.1 83.3 81.2 84.2 84.7  PLT 452* 366 341 318 297 536   Basic Metabolic Panel:  Recent Labs Lab 03/13/16 2247 03/14/16 0242 03/14/16 0512 03/14/16 1353 03/15/16 0309 03/16/16 0309 03/17/16 0523  NA 130*  --   129*  --  135 137 137  K 2.9*  --  2.9* 3.9 3.8 3.3* 4.0  CL 94*  --  97*  --  107 107 109  CO2 20*  --  20*  --  19* 23 20*  GLUCOSE 285*  --  324*  --  233* 217* 169*  BUN 22*  --  23*  --  23* 21* 19  CREATININE 1.35* 1.43* 1.34*  --  1.00 0.80 0.70  CALCIUM 10.4*  --  9.4  --  9.3 9.6 10.0  MG  --   --   --   --  1.8 1.7  --     Liver Function Tests:  Recent Labs Lab 03/13/16 2247 03/15/16 0309  AST 39 21  ALT 21 16  ALKPHOS 88 51  BILITOT 1.1 0.5  PROT 7.5 6.1*  ALBUMIN 3.2* 2.5*   No results for input(s): LIPASE, AMYLASE in the last 168 hours. No results for input(s): AMMONIA in the last 168 hours. Coagulation Profile:  Recent Labs Lab 03/14/16 0242 03/15/16 0309  INR 1.53* 1.53*   Cardiac Enzymes:  Recent Labs Lab 03/13/16 2247 03/15/16 0309  CKTOTAL  --  113  TROPONINI 0.03  --    BNP (last 3 results) No results for input(s): PROBNP in the last 8760 hours.  CBG:  Recent Labs Lab 03/16/16 0814 03/16/16 1214 03/16/16 1722 03/16/16 2118 03/17/16 0734  GLUCAP 194* 234* 155* 208* 157*    Studies: No results found.   Scheduled Meds: . antiseptic oral rinse  7 mL Mouth Rinse q12n4p  . aspirin EC  81 mg Oral Daily  . chlorhexidine  15 mL Mouth Rinse BID  . collagenase   Topical Daily  . enoxaparin (LOVENOX) injection  0.5 mg/kg Subcutaneous Q24H  . ertapenem  1 g Intravenous Q24H  . feeding supplement (ENSURE ENLIVE)  237 mL Oral TID BM  . feeding supplement (PRO-STAT SUGAR FREE 64)  30 mL Oral Daily  . insulin aspart  0-20 Units Subcutaneous TID WC  . insulin aspart  0-5 Units Subcutaneous QHS  . insulin glargine  16 Units Subcutaneous Daily  . multivitamin with minerals  1 tablet Oral Daily  . saccharomyces boulardii  250 mg Oral BID   Continuous Infusions: . sodium chloride 1,000 mL (03/17/16 0415)   PRN Meds: acetaminophen **OR** acetaminophen, bisacodyl, ondansetron **OR** ondansetron (ZOFRAN) IV  Time spent: 30  minutes  Author: Berle Mull, MD [facility] Pager: [phone] 03/17/2016 8:14 AM  If 7PM-7AM, please contact night-coverage at [email or web address], password [email or web address]

## 2016-03-18 ENCOUNTER — Encounter (HOSPITAL_COMMUNITY): Payer: Self-pay

## 2016-03-18 DIAGNOSIS — M869 Osteomyelitis, unspecified: Secondary | ICD-10-CM

## 2016-03-18 DIAGNOSIS — B954 Other streptococcus as the cause of diseases classified elsewhere: Secondary | ICD-10-CM

## 2016-03-18 DIAGNOSIS — B9620 Unspecified Escherichia coli [E. coli] as the cause of diseases classified elsewhere: Secondary | ICD-10-CM

## 2016-03-18 DIAGNOSIS — M4628 Osteomyelitis of vertebra, sacral and sacrococcygeal region: Secondary | ICD-10-CM

## 2016-03-18 DIAGNOSIS — E042 Nontoxic multinodular goiter: Secondary | ICD-10-CM

## 2016-03-18 DIAGNOSIS — B9689 Other specified bacterial agents as the cause of diseases classified elsewhere: Secondary | ICD-10-CM

## 2016-03-18 LAB — GLUCOSE, CAPILLARY
GLUCOSE-CAPILLARY: 133 mg/dL — AB (ref 65–99)
GLUCOSE-CAPILLARY: 144 mg/dL — AB (ref 65–99)
GLUCOSE-CAPILLARY: 141 mg/dL — AB (ref 65–99)
Glucose-Capillary: 142 mg/dL — ABNORMAL HIGH (ref 65–99)

## 2016-03-18 MED ORDER — SODIUM CHLORIDE 0.9 % IV SOLN
1000.0000 mL | INTRAVENOUS | Status: DC
  Administered 2016-03-19 (×2): 1000 mL via INTRAVENOUS

## 2016-03-18 MED ORDER — FLUCONAZOLE IN SODIUM CHLORIDE 200-0.9 MG/100ML-% IV SOLN
200.0000 mg | INTRAVENOUS | Status: DC
  Administered 2016-03-18 – 2016-03-19 (×2): 200 mg via INTRAVENOUS
  Filled 2016-03-18 (×3): qty 100

## 2016-03-18 MED ORDER — METOPROLOL SUCCINATE ER 100 MG PO TB24
200.0000 mg | ORAL_TABLET | Freq: Every day | ORAL | Status: DC
  Filled 2016-03-18: qty 2

## 2016-03-18 MED ORDER — METOPROLOL SUCCINATE ER 100 MG PO TB24
100.0000 mg | ORAL_TABLET | Freq: Every day | ORAL | Status: DC
  Administered 2016-03-18 – 2016-03-21 (×4): 100 mg via ORAL
  Filled 2016-03-18 (×4): qty 1

## 2016-03-18 MED ORDER — GLUCERNA PO LIQD: 237.0000 mL | Freq: Three times a day (TID) | ORAL | Status: DC

## 2016-03-18 MED ORDER — GLUCERNA SHAKE PO LIQD
237.0000 mL | Freq: Three times a day (TID) | ORAL | Status: DC
  Administered 2016-03-18 – 2016-03-21 (×8): 237 mL via ORAL
  Filled 2016-03-18 (×10): qty 237

## 2016-03-18 NOTE — Progress Notes (Signed)
PROGRESS NOTE    Sharon Warner  [id number] DOB: May 01, 1951 DOA: 03/13/2016  PCP: [name], MD   Brief Narrative:  64 year old female who is paraplegic with unknown etiology and has a chronic Foley, diabetes mellitus, obstructive sleep apnea, morbid obesity and a sacral ulcer. She was admitted to the hospital for generalized weakness and found to be septic. Source has been determined to be from sacral osteomyelitis. Diabetes is diet controlled. She has private caretakers at home and lives with her husband. Has no sensation below the lumbar spine  Subjective: She has no complaints. Discussing plan of discharge.  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    Hypokalemia Resolved  Type 2 diabetes mellitus  -Lantus 16 units daily and insulin sliding scale-hemoglobin A1c 8.4-not on medication at home    AKI (acute kidney injury) -Creatinine 1.43 improved to 0.7  -Likely was due to sepsis and dehydration    Lactic acidosis -Resolved    Multiple thyroid nodules -Outpatient follow-up-TSH is normal-free T4 mildly elevated at 1.23    Enlarged thoracic aorta  -4.2 cm measured on CT scan    HTN (hypertension) -We'll resume metoprolol    Paraplegia with Neurogenic bladder - Continue Foley catheter-last changed on 6/15 and needs change in 1 month  Candida dermatitis -Groin and upper thigh area-start Diflucan    Obstructive sleep apnea - C Pap    DVT prophylaxis: Lovenox Code Status: Full code Family  Communication:  Disposition Plan: Continue to follow in hospital for 1-2 more days Consultants:   ID   Procedures:    Antimicrobials:  Anti-infectives    Start     Dose/Rate Route Frequency Ordered Stop   03/15/16 1200  ertapenem (INVANZ) 1 g in sodium chloride 0.9 % 50 mL IVPB     1 g 100 mL/hr over 30 Minutes Intravenous Every 24 hours 03/15/16 1105     03/15/16 0600  vancomycin (VANCOCIN) 1,250 mg in sodium chloride 0.9 % 250 mL IVPB  Status:  Discontinued     1,250 mg 166.7 mL/hr over 90 Minutes Intravenous Every 24 hours 03/14/16 0234 03/15/16 1105   03/14/16 0800  metroNIDAZOLE (FLAGYL) IVPB 500 mg  Status:  Discontinued    Comments:  Per pharmacy consult   500 mg 100 mL/hr over 60 Minutes Intravenous Every 8 hours 03/14/16 0215 03/15/16 1105   03/14/16 0800  aztreonam (AZACTAM) 2 g in dextrose 5 % 50 mL IVPB  Status:  Discontinued     2 g 100 mL/hr over 30 Minutes Intravenous Every 8 hours 03/14/16 0226 03/15/16 1105   03/14/16 0300  vancomycin (VANCOCIN) 2,500 mg in sodium chloride 0.9 % 500 mL IVPB     2,500 mg 250 mL/hr over 120 Minutes Intravenous  Once 03/14/16 0229 03/14/16 0500   03/13/16 2330  metroNIDAZOLE (FLAGYL) IVPB 500 mg     500 mg 100 mL/hr over 60 Minutes Intravenous  Once 03/13/16 2257 03/14/16 0112   03/13/16 2300  aztreonam (AZACTAM) 2 g in dextrose 5 % 50  mL IVPB     2 g 100 mL/hr over 30 Minutes Intravenous STAT 03/13/16 2257 03/14/16 0013       Objective: Filed Vitals:   03/17/16 1448 03/17/16 2056 03/18/16 0549 03/18/16 0900  BP: 145/96 159/99 152/87 142/79  Pulse: 114 109 88 100  Temp: 98.4 F (36.9 C) 98.5 F (36.9 C) 97.8 F (36.6 C) 97.7 F (36.5 C)  TempSrc: Oral Oral Oral Oral  Resp: 19 20 19    Height:      Weight:      SpO2: 95% 97% 100% 99%    Intake/Output Summary (Last 24 hours) at 03/18/16 1350 Last data filed at 03/18/16 1000  Gross per 24 hour  Intake   1630 ml  Output   1575 ml  Net     55 ml   Filed Weights     03/15/16 0500 03/16/16 0500 03/17/16 0454  Weight: 114.8 kg (253 lb 1.4 oz) 118.3 kg (260 lb 12.9 oz) 122.8 kg (270 lb 11.6 oz)    Examination: General exam: Appears comfortable -Morbidly obese HEENT: PERRLA, oral mucosa moist, no sclera icterus or thrush Respiratory system: Clear to auscultation. Respiratory effort normal. Cardiovascular system: S1 & S2 heard, RRR.  No murmurs  Gastrointestinal system: Abdomen soft, non-tender, nondistended. Normal bowel sound. No organomegaly Central nervous system: Alert and oriented. No focal neurological deficits. Extremities: No cyanosis, clubbing or edema Skin: Stage IV sacral decubitus ulcer measuring about 8 cm in diameter Psychiatry:  Mood & affect appropriate.     Data Reviewed: I have personally reviewed following labs and imaging studies  CBC:  Recent Labs Lab 03/13/16 2247 03/14/16 0242 03/14/16 0512 03/15/16 0309 03/16/16 0309 03/17/16 0523  WBC 23.5* 29.9* 33.0* 15.1* 11.6* 12.5*  NEUTROABS 21.1*  --   --  11.3* 7.2  --   HGB 13.3 11.9* 11.5* 11.0* 10.4* 10.5*  HCT 40.6 37.1 35.5* 33.2* 32.6* 33.2*  MCV 82.9 84.1 83.3 81.2 84.2 84.7  PLT 452* 366 341 318 297 [id number]   Basic Metabolic Panel:  Recent Labs Lab 03/13/16 2247 03/14/16 0242 03/14/16 0512 03/14/16 1353 03/15/16 0309 03/16/16 0309 03/17/16 0523  NA 130*  --  129*  --  135 137 137  K 2.9*  --  2.9* 3.9 3.8 3.3* 4.0  CL 94*  --  97*  --  107 107 109  CO2 20*  --  20*  --  19* 23 20*  GLUCOSE 285*  --  324*  --  233* 217* 169*  BUN 22*  --  23*  --  23* 21* 19  CREATININE 1.35* 1.43* 1.34*  --  1.00 0.80 0.70  CALCIUM 10.4*  --  9.4  --  9.3 9.6 10.0  MG  --   --   --   --  1.8 1.7  --    GFR: Estimated Creatinine Clearance: 89.6 mL/min (by C-G formula based on Cr of 0.7). Liver Function Tests:  Recent Labs Lab 03/13/16 2247 03/15/16 0309  AST 39 21  ALT 21 16  ALKPHOS 88 51  BILITOT 1.1 0.5  PROT 7.5 6.1*  ALBUMIN 3.2* 2.5*   No results for  input(s): LIPASE, AMYLASE in the last 168 hours. No results for input(s): AMMONIA in the last 168 hours. Coagulation Profile:  Recent Labs Lab 03/14/16 0242 03/15/16 0309  INR 1.53* 1.53*   Cardiac Enzymes:  Recent Labs Lab 03/13/16 2247 03/15/16 0309  CKTOTAL  --  113  TROPONINI 0.03  --  BNP (last 3 results) No results for input(s): PROBNP in the last 8760 hours. HbA1C: No results for input(s): HGBA1C in the last 72 hours. CBG:  Recent Labs Lab 03/17/16 1121 03/17/16 1656 03/17/16 2055 03/18/16 0744 03/18/16 1334  GLUCAP 181* 145* 124* 142* 133*   Lipid Profile: No results for input(s): CHOL, HDL, LDLCALC, TRIG, CHOLHDL, LDLDIRECT in the last 72 hours. Thyroid Function Tests: No results for input(s): TSH, T4TOTAL, FREET4, T3FREE, THYROIDAB in the last 72 hours. Anemia Panel: No results for input(s): VITAMINB12, FOLATE, FERRITIN, TIBC, IRON, RETICCTPCT in the last 72 hours. Urine analysis:    Component Value Date/Time   COLORURINE YELLOW 03/13/2016 2241   APPEARANCEUR TURBID* 03/13/2016 2241   LABSPEC 1.011 03/13/2016 2241   PHURINE 6.0 03/13/2016 2241   GLUCOSEU NEGATIVE 03/13/2016 2241   HGBUR MODERATE* 03/13/2016 2241   BILIRUBINUR NEGATIVE 03/13/2016 2241   KETONESUR NEGATIVE 03/13/2016 2241   PROTEINUR 100* 03/13/2016 2241   NITRITE NEGATIVE 03/13/2016 2241   LEUKOCYTESUR LARGE* 03/13/2016 2241   Sepsis Labs: @LABRCNTIP (procalcitonin:4,lacticidven:4) ) Recent Results (from the past 240 hour(s))  Wound or Superficial Culture     Status: None   Collection Time: 03/13/16 10:38 PM  Result Value Ref Range Status   Specimen Description DECUBITIS  Final   Special Requests Normal  Final   Gram Stain   Final    RARE WBC PRESENT, PREDOMINANTLY PMN MODERATE GRAM POSITIVE COCCI IN PAIRS FEW GRAM VARIABLE ROD    Culture   Final    FEW PROTEUS MIRABILIS NO STAPHYLOCOCCUS AUREUS ISOLATED NO GROUP A STREP (S.PYOGENES) ISOLATED Performed at [facility]    Report Status 03/17/2016 FINAL  Final   Organism ID, Bacteria PROTEUS MIRABILIS  Final      Susceptibility   Proteus mirabilis - MIC*    AMPICILLIN <=2 SENSITIVE Sensitive     CEFAZOLIN <=4 SENSITIVE Sensitive     CEFEPIME <=1 SENSITIVE Sensitive     CEFTAZIDIME <=1 SENSITIVE Sensitive     CEFTRIAXONE <=1 SENSITIVE Sensitive     CIPROFLOXACIN 1 SENSITIVE Sensitive     GENTAMICIN <=1 SENSITIVE Sensitive     IMIPENEM 4 SENSITIVE Sensitive     TRIMETH/SULFA <=20 SENSITIVE Sensitive     AMPICILLIN/SULBACTAM <=2 SENSITIVE Sensitive     PIP/TAZO <=4 SENSITIVE Sensitive     * FEW PROTEUS MIRABILIS  Urine culture     Status: Abnormal   Collection Time: 03/13/16 10:42 PM  Result Value Ref Range Status   Specimen Description URINE, CATHETERIZED  Final   Special Requests NONE  Final   Culture MULTIPLE SPECIES PRESENT, SUGGEST RECOLLECTION (A)  Final   Report Status 03/15/2016 FINAL  Final  Blood Culture (routine x 2)     Status: Abnormal (Preliminary result)   Collection Time: 03/13/16 10:49 PM  Result Value Ref Range Status   Specimen Description LEFT ANTECUBITAL  Final   Special Requests BOTTLES DRAWN AEROBIC AND ANAEROBIC 5CC  Final   Culture  Setup Time   Final    GRAM NEGATIVE RODS GRAM POSITIVE COCCI IN PAIRS ANAEROBIC BOTTLE ONLY CRITICAL RESULT CALLED TO, READ BACK BY AND VERIFIED WITH: BETH GREEN,PHARMD @0135  03/15/16 MKELLY GRAM NEGATIVE RODS AEROBIC BOTTLE ONLY    Culture (A)  Final    ESCHERICHIA COLI GRAM POSITIVE COCCI CULTURE REINCUBATED FOR BETTER GROWTH    Report Status PENDING  Incomplete   Organism ID, Bacteria ESCHERICHIA COLI  Final      Susceptibility   Escherichia coli - MIC*  AMPICILLIN >=32 RESISTANT Resistant     CEFAZOLIN 8 SENSITIVE Sensitive     CEFEPIME <=1 SENSITIVE Sensitive     CEFTAZIDIME <=1 SENSITIVE Sensitive     CEFTRIAXONE <=1 SENSITIVE Sensitive     CIPROFLOXACIN <=0.25 SENSITIVE Sensitive     GENTAMICIN <=1 SENSITIVE  Sensitive     IMIPENEM <=0.25 SENSITIVE Sensitive     TRIMETH/SULFA >=320 RESISTANT Resistant     AMPICILLIN/SULBACTAM >=32 RESISTANT Resistant     PIP/TAZO Value in next row Sensitive      [facility]  Blood Culture ID Panel (Reflexed)     Status: Abnormal   Collection Time: 03/13/16 10:49 PM  Result Value Ref Range Status   Enterococcus species NOT DETECTED NOT DETECTED Final   Vancomycin resistance NOT DETECTED NOT DETECTED Final   Listeria monocytogenes NOT DETECTED NOT DETECTED Final   Staphylococcus species NOT DETECTED NOT DETECTED Final   Staphylococcus aureus NOT DETECTED NOT DETECTED Final   Methicillin resistance NOT DETECTED NOT DETECTED Final   Streptococcus species DETECTED (A) NOT DETECTED Final    Comment: CRITICAL RESULT CALLED TO, READ BACK BY AND VERIFIED WITH: BETH GREEN, PHARMD @0135  03/15/16 MKELLY    Streptococcus agalactiae NOT DETECTED NOT DETECTED Final   Streptococcus pneumoniae NOT DETECTED NOT DETECTED Final   Streptococcus pyogenes NOT DETECTED NOT DETECTED Final   Acinetobacter baumannii NOT DETECTED NOT DETECTED Final   Enterobacteriaceae species DETECTED (A) NOT DETECTED Final    Comment: CRITICAL RESULT CALLED TO, READ BACK BY AND VERIFIED WITH: BETH GREEN, PHARMD @0135  03/15/16 MKELLY    Enterobacter cloacae complex NOT DETECTED NOT DETECTED Final   Escherichia coli DETECTED (A) NOT DETECTED Final    Comment: CRITICAL RESULT CALLED TO, READ BACK BY AND VERIFIED WITH: BETH GREEN, PHARMD @0135  03/15/16 MKELLY    Klebsiella oxytoca NOT DETECTED NOT DETECTED Final   Klebsiella pneumoniae NOT DETECTED NOT DETECTED Final   Proteus species NOT DETECTED NOT DETECTED Final   Serratia marcescens NOT DETECTED NOT DETECTED Final   Carbapenem resistance NOT DETECTED NOT DETECTED Final   Haemophilus influenzae NOT DETECTED NOT DETECTED Final   Neisseria meningitidis NOT DETECTED NOT DETECTED Final    Pseudomonas aeruginosa NOT DETECTED NOT DETECTED Final   Candida albicans NOT DETECTED NOT DETECTED Final   Candida glabrata NOT DETECTED NOT DETECTED Final   Candida krusei NOT DETECTED NOT DETECTED Final   Candida parapsilosis NOT DETECTED NOT DETECTED Final   Candida tropicalis NOT DETECTED NOT DETECTED Final  Blood Culture (routine x 2)     Status: Abnormal (Preliminary result)   Collection Time: 03/13/16 11:35 PM  Result Value Ref Range Status   Specimen Description BLOOD RIGHT FOREARM  Final   Special Requests BOTTLES DRAWN AEROBIC AND ANAEROBIC 5CC  Final   Culture  Setup Time   Final    GRAM VARIABLE ROD ONLY IN ANAEROBIC BOTTLE GRAM POSITIVE COCCI IN PAIRS IN CHAINS CRITICAL RESULT CALLED TO, READ BACK BY AND VERIFIED WITH: C SUMME 03/15/16 @ 1204 M VESTAL IN BOTH AEROBIC AND ANAEROBIC BOTTLES    Culture (A)  Final    STREPTOCOCCUS GROUP F GRAM VARIABLE ROD CULTURE REINCUBATED FOR BETTER GROWTH Performed at [facility]    Report Status PENDING  Incomplete   Organism ID, Bacteria STREPTOCOCCUS GROUP F  Final      Susceptibility   Streptococcus group f - MIC*    PENICILLIN <=0.06 SENSITIVE Sensitive  CEFTRIAXONE <=0.12 SENSITIVE Sensitive     ERYTHROMYCIN <=0.12 SENSITIVE Sensitive     LEVOFLOXACIN <=0.25 SENSITIVE Sensitive     VANCOMYCIN 0.25 SENSITIVE Sensitive     * STREPTOCOCCUS GROUP F  Blood Culture ID Panel (Reflexed)     Status: Abnormal   Collection Time: 03/13/16 11:35 PM  Result Value Ref Range Status   Enterococcus species NOT DETECTED NOT DETECTED Final   Vancomycin resistance NOT DETECTED NOT DETECTED Final   Listeria monocytogenes NOT DETECTED NOT DETECTED Final   Staphylococcus species NOT DETECTED NOT DETECTED Final   Staphylococcus aureus NOT DETECTED NOT DETECTED Final   Methicillin resistance NOT DETECTED NOT DETECTED Final   Streptococcus species DETECTED (A) NOT DETECTED Final    Comment: CRITICAL RESULT CALLED TO, READ BACK BY AND  VERIFIED WITH: C SUMME 03/15/16 @ 1204 M VESTAL    Streptococcus agalactiae NOT DETECTED NOT DETECTED Final   Streptococcus pneumoniae NOT DETECTED NOT DETECTED Final   Streptococcus pyogenes NOT DETECTED NOT DETECTED Final   Acinetobacter baumannii NOT DETECTED NOT DETECTED Final   Enterobacteriaceae species NOT DETECTED NOT DETECTED Final   Enterobacter cloacae complex NOT DETECTED NOT DETECTED Final   Escherichia coli NOT DETECTED NOT DETECTED Final   Klebsiella oxytoca NOT DETECTED NOT DETECTED Final   Klebsiella pneumoniae NOT DETECTED NOT DETECTED Final   Proteus species NOT DETECTED NOT DETECTED Final   Serratia marcescens NOT DETECTED NOT DETECTED Final   Carbapenem resistance NOT DETECTED NOT DETECTED Final   Haemophilus influenzae NOT DETECTED NOT DETECTED Final   Neisseria meningitidis NOT DETECTED NOT DETECTED Final   Pseudomonas aeruginosa NOT DETECTED NOT DETECTED Final   Candida albicans NOT DETECTED NOT DETECTED Final   Candida glabrata NOT DETECTED NOT DETECTED Final   Candida krusei NOT DETECTED NOT DETECTED Final   Candida parapsilosis NOT DETECTED NOT DETECTED Final   Candida tropicalis NOT DETECTED NOT DETECTED Final    Comment: Performed at [facility]  MRSA PCR Screening     Status: None   Collection Time: 03/14/16  1:58 AM  Result Value Ref Range Status   MRSA by PCR NEGATIVE NEGATIVE Final    Comment:        The GeneXpert MRSA Assay (FDA approved for NASAL specimens only), is one component of a comprehensive MRSA colonization surveillance program. It is not intended to diagnose MRSA infection nor to guide or monitor treatment for MRSA infections.   Culture, blood (routine x 2)     Status: None (Preliminary result)   Collection Time: 03/15/16  8:46 AM  Result Value Ref Range Status   Specimen Description BLOOD RIGHT HAND  Final   Special Requests BOTTLES DRAWN AEROBIC AND ANAEROBIC 5CC  Final   Culture   Final    NO GROWTH 2 DAYS Performed  at [facility]    Report Status PENDING  Incomplete  Culture, blood (routine x 2)     Status: None (Preliminary result)   Collection Time: 03/15/16  8:48 AM  Result Value Ref Range Status   Specimen Description BLOOD LEFT HAND  Final   Special Requests IN PEDIATRIC BOTTLE [place]  Final   Culture   Final    NO GROWTH 2 DAYS Performed at [facility]    Report Status PENDING  Incomplete         Radiology Studies: No results found.    Scheduled Meds: . antiseptic oral rinse  7 mL Mouth Rinse q12n4p  . aspirin EC  81 mg Oral Daily  . chlorhexidine  15 mL Mouth Rinse BID  . collagenase   Topical Daily  . enoxaparin (LOVENOX) injection  0.5 mg/kg Subcutaneous Q24H  . ertapenem  1 g Intravenous Q24H  . feeding supplement (ENSURE ENLIVE)  237 mL Oral TID BM  . feeding supplement (PRO-STAT SUGAR FREE 64)  30 mL Oral Daily  . insulin aspart  0-20 Units Subcutaneous TID WC  . insulin aspart  0-5 Units Subcutaneous QHS  . insulin glargine  16 Units Subcutaneous Daily  . multivitamin with minerals  1 tablet Oral Daily  . saccharomyces boulardii  250 mg Oral BID   Continuous Infusions: . sodium chloride 1,000 mL (03/18/16 0754)     LOS: 4 days    Time spent in minutes: [place], MD [facility] Pager: [email or web address] Password [email or web address] 03/18/2016, 1:50 PM

## 2016-03-18 NOTE — Progress Notes (Signed)
Attempted x2 to place PICC in right cephalic without success, unable to thread guidewire despite good cannulation of vein.  Other veins unable to be attempted due to depth of vein.  Manuela Schwartz, RN updated.

## 2016-03-18 NOTE — Care Management Note (Signed)
Case Management Note  Patient Details  Name: Carsen Emmendorfer MRN: [id number] Date of Birth: September 16, 1951  Subjective/Objective: Referral for LTAC-aptient in agreement to [facility] if accepted. [facility] rep-Bryan & [facility] rep-Andrea aware. Await response from LTAC.No picc yet. D/c plan long term iv abx. Patient from home w/spouse-has hoyer lift, w/c-patient currently working on motorized w/c with pcp, & dme company.[place] agency list provided to patient as alternative plan if LTAC not approved.                   Action/Plan:d/c plan LTAC if approved vs home w/HHC.   Expected Discharge Date:                  Expected Discharge Plan:  Long Term Acute Care (LTAC)  In-House Referral:     Discharge planning Services  CM Consult  Post Acute Care Choice:    Choice offered to:  Patient  DME Arranged:    DME Agency:     HH Arranged:    [place] Agency:     Status of Service:  In process, will continue to follow  Medicare Important Message Given:    Date Medicare IM Given:    Medicare IM give by:    Date Additional Medicare IM Given:    Additional Medicare Important Message give by:     If discussed at [place] of Stay Meetings, dates discussed:    Additional Comments:  Dessa Phi, RN 03/18/2016, 11:37 AM

## 2016-03-18 NOTE — Progress Notes (Signed)
RT placed patient on CPAP. Patient is tolerating well. RT will continue to monitor

## 2016-03-18 NOTE — Progress Notes (Signed)
[facility]  Patient Status:  New pt this admission to [facility]  [facility] is providing the following services: HHRN and [facility] for home IV ABX. If pt does DC to LTAC, [facility] will follow pt while inpatient at [facility] to support transition to home when deemed appropriate in order to provide needed home care services.   If patient discharges after hours, please call [phone].   Larry Sierras 03/18/2016, 10:40 PM

## 2016-03-18 NOTE — Progress Notes (Signed)
Patient ID: Sharon Warner, female   DOB: [birth year], 65 y.o.   MRN: [id number]         [facility] for Infectious Disease    Date of Admission:  03/13/2016   Total days of antibiotics 6        Day 4 ertapenem         Principal Problem:   Sacral osteomyelitis (HCC) Active Problems:   Severe sepsis (HCC)   AKI (acute kidney injury) ([place])   Lactic acidosis   Infected decubitus ulcer   Bacteremia   Paraplegia (HCC)   Morbid obesity (HCC)   Hypokalemia   Type 2 diabetes mellitus (HCC)   Multiple thyroid nodules   Enlarged thoracic aorta (HCC)   HTN (hypertension)   Pressure ulcer   Neurogenic bladder   Obstructive sleep apnea   Normocytic anemia   Proctitis   Diarrhea   . antiseptic oral rinse  7 mL Mouth Rinse q12n4p  . aspirin EC  81 mg Oral Daily  . chlorhexidine  15 mL Mouth Rinse BID  . collagenase   Topical Daily  . enoxaparin (LOVENOX) injection  0.5 mg/kg Subcutaneous Q24H  . ertapenem  1 g Intravenous Q24H  . feeding supplement (ENSURE ENLIVE)  237 mL Oral TID BM  . feeding supplement (PRO-STAT SUGAR FREE 64)  30 mL Oral Daily  . insulin aspart  0-20 Units Subcutaneous TID WC  . insulin aspart  0-5 Units Subcutaneous QHS  . insulin glargine  16 Units Subcutaneous Daily  . multivitamin with minerals  1 tablet Oral Daily  . saccharomyces boulardii  250 mg Oral BID    SUBJECTIVE: She states that she is feeling much better. She plans on returning to her new condominium hearing [place] upon discharge.  Review of Systems: Review of Systems  Constitutional: Negative for fever, chills and diaphoresis.  Respiratory: Negative for shortness of breath.   Cardiovascular: Negative for chest pain.  Gastrointestinal: Negative for nausea, vomiting, abdominal pain and diarrhea.    Past Medical History  Diagnosis Date  . Hypertension   . Diabetes mellitus without complication ([place])   . H/O paraplegia     ascending paralysis unclear etiology  . Chronic  indwelling Foley catheter     last changed few days ago  . Morbid obesity ([place])   . Lumbar spondylosis   . OSA on CPAP     Social History  Substance Use Topics  . Smoking status: Never Smoker   . Smokeless tobacco: None  . Alcohol Use: No    Family History  Problem Relation Age of Onset  . Hypothyroidism Mother   . Diabetes Maternal Grandmother   . Heart disease Maternal Grandfather    Allergies  Allergen Reactions  . Levaquin [Levofloxacin In D5w] Other (See Comments)    Unknown childhood allergy  . Penicillins Other (See Comments)    Unknown childhood allergy    OBJECTIVE: Filed Vitals:   03/17/16 1448 03/17/16 2056 03/18/16 0549 03/18/16 0900  BP: 145/96 159/99 152/87 142/79  Pulse: 114 109 88 100  Temp: 98.4 F (36.9 C) 98.5 F (36.9 C) 97.8 F (36.6 C) 97.7 F (36.5 C)  TempSrc: Oral Oral Oral Oral  Resp: 19 20 19    Height:      Weight:      SpO2: 95% 97% 100% 99%   Body mass index is 51.18 kg/(m^2).  Physical Exam  Constitutional:  She is alert and smiling, resting comfortably in bed.  Skin:  Her PICC has  not been placed yet.    Lab Results Lab Results  Component Value Date   WBC 12.5* 03/17/2016   HGB 10.5* 03/17/2016   HCT 33.2* 03/17/2016   MCV 84.7 03/17/2016   PLT 316 03/17/2016    Lab Results  Component Value Date   CREATININE 0.70 03/17/2016   BUN 19 03/17/2016   NA 137 03/17/2016   K 4.0 03/17/2016   CL 109 03/17/2016   CO2 20* 03/17/2016    Lab Results  Component Value Date   ALT 16 03/15/2016   AST 21 03/15/2016   ALKPHOS 51 03/15/2016   BILITOT 0.5 03/15/2016    SED RATE (mm/hr)  Date Value  03/15/2016 95*   CRP (mg/dL)  Date Value  03/15/2016 21.9*   Microbiology: Recent Results (from the past 240 hour(s))  Wound or Superficial Culture     Status: None   Collection Time: 03/13/16 10:38 PM  Result Value Ref Range Status   Specimen Description DECUBITIS  Final   Special Requests Normal  Final   Gram Stain    Final    RARE WBC PRESENT, PREDOMINANTLY PMN MODERATE GRAM POSITIVE COCCI IN PAIRS FEW GRAM VARIABLE ROD    Culture   Final    FEW PROTEUS MIRABILIS NO STAPHYLOCOCCUS AUREUS ISOLATED NO GROUP A STREP (S.PYOGENES) ISOLATED Performed at [facility]    Report Status 03/17/2016 FINAL  Final   Organism ID, Bacteria PROTEUS MIRABILIS  Final      Susceptibility   Proteus mirabilis - MIC*    AMPICILLIN <=2 SENSITIVE Sensitive     CEFAZOLIN <=4 SENSITIVE Sensitive     CEFEPIME <=1 SENSITIVE Sensitive     CEFTAZIDIME <=1 SENSITIVE Sensitive     CEFTRIAXONE <=1 SENSITIVE Sensitive     CIPROFLOXACIN 1 SENSITIVE Sensitive     GENTAMICIN <=1 SENSITIVE Sensitive     IMIPENEM 4 SENSITIVE Sensitive     TRIMETH/SULFA <=20 SENSITIVE Sensitive     AMPICILLIN/SULBACTAM <=2 SENSITIVE Sensitive     PIP/TAZO <=4 SENSITIVE Sensitive     * FEW PROTEUS MIRABILIS  Urine culture     Status: Abnormal   Collection Time: 03/13/16 10:42 PM  Result Value Ref Range Status   Specimen Description URINE, CATHETERIZED  Final   Special Requests NONE  Final   Culture MULTIPLE SPECIES PRESENT, SUGGEST RECOLLECTION (A)  Final   Report Status 03/15/2016 FINAL  Final  Blood Culture (routine x 2)     Status: Abnormal (Preliminary result)   Collection Time: 03/13/16 10:49 PM  Result Value Ref Range Status   Specimen Description LEFT ANTECUBITAL  Final   Special Requests BOTTLES DRAWN AEROBIC AND ANAEROBIC 5CC  Final   Culture  Setup Time   Final    GRAM NEGATIVE RODS GRAM POSITIVE COCCI IN PAIRS ANAEROBIC BOTTLE ONLY CRITICAL RESULT CALLED TO, READ BACK BY AND VERIFIED WITH: BETH GREEN,PHARMD @0135  03/15/16 MKELLY GRAM NEGATIVE RODS AEROBIC BOTTLE ONLY    Culture (A)  Final    ESCHERICHIA COLI GRAM POSITIVE COCCI CULTURE REINCUBATED FOR BETTER GROWTH    Report Status PENDING  Incomplete   Organism ID, Bacteria ESCHERICHIA COLI  Final      Susceptibility   Escherichia coli - MIC*    AMPICILLIN >=32  RESISTANT Resistant     CEFAZOLIN 8 SENSITIVE Sensitive     CEFEPIME <=1 SENSITIVE Sensitive     CEFTAZIDIME <=1 SENSITIVE Sensitive     CEFTRIAXONE <=1 SENSITIVE Sensitive     CIPROFLOXACIN <=0.25 SENSITIVE  Sensitive     GENTAMICIN <=1 SENSITIVE Sensitive     IMIPENEM <=0.25 SENSITIVE Sensitive     TRIMETH/SULFA >=320 RESISTANT Resistant     AMPICILLIN/SULBACTAM >=32 RESISTANT Resistant     PIP/TAZO Value in next row Sensitive      16 SENSITIVEPerformed at [place]  Blood Culture ID Panel (Reflexed)     Status: Abnormal   Collection Time: 03/13/16 10:49 PM  Result Value Ref Range Status   Enterococcus species NOT DETECTED NOT DETECTED Final   Vancomycin resistance NOT DETECTED NOT DETECTED Final   Listeria monocytogenes NOT DETECTED NOT DETECTED Final   Staphylococcus species NOT DETECTED NOT DETECTED Final   Staphylococcus aureus NOT DETECTED NOT DETECTED Final   Methicillin resistance NOT DETECTED NOT DETECTED Final   Streptococcus species DETECTED (A) NOT DETECTED Final    Comment: CRITICAL RESULT CALLED TO, READ BACK BY AND VERIFIED WITH: BETH GREEN, PHARMD @0135  03/15/16 MKELLY    Streptococcus agalactiae NOT DETECTED NOT DETECTED Final   Streptococcus pneumoniae NOT DETECTED NOT DETECTED Final   Streptococcus pyogenes NOT DETECTED NOT DETECTED Final   Acinetobacter baumannii NOT DETECTED NOT DETECTED Final   Enterobacteriaceae species DETECTED (A) NOT DETECTED Final    Comment: CRITICAL RESULT CALLED TO, READ BACK BY AND VERIFIED WITH: BETH GREEN, PHARMD @0135  03/15/16 MKELLY    Enterobacter cloacae complex NOT DETECTED NOT DETECTED Final   Escherichia coli DETECTED (A) NOT DETECTED Final    Comment: CRITICAL RESULT CALLED TO, READ BACK BY AND VERIFIED WITH: BETH GREEN, PHARMD @0135  03/15/16 MKELLY    Klebsiella oxytoca NOT DETECTED NOT DETECTED Final   Klebsiella pneumoniae NOT DETECTED NOT DETECTED Final   Proteus species NOT DETECTED  NOT DETECTED Final   Serratia marcescens NOT DETECTED NOT DETECTED Final   Carbapenem resistance NOT DETECTED NOT DETECTED Final   Haemophilus influenzae NOT DETECTED NOT DETECTED Final   Neisseria meningitidis NOT DETECTED NOT DETECTED Final   Pseudomonas aeruginosa NOT DETECTED NOT DETECTED Final   Candida albicans NOT DETECTED NOT DETECTED Final   Candida glabrata NOT DETECTED NOT DETECTED Final   Candida krusei NOT DETECTED NOT DETECTED Final   Candida parapsilosis NOT DETECTED NOT DETECTED Final   Candida tropicalis NOT DETECTED NOT DETECTED Final  Blood Culture (routine x 2)     Status: Abnormal (Preliminary result)   Collection Time: 03/13/16 11:35 PM  Result Value Ref Range Status   Specimen Description BLOOD RIGHT FOREARM  Final   Special Requests BOTTLES DRAWN AEROBIC AND ANAEROBIC 5CC  Final   Culture  Setup Time   Final    GRAM VARIABLE ROD ONLY IN ANAEROBIC BOTTLE GRAM POSITIVE COCCI IN PAIRS IN CHAINS CRITICAL RESULT CALLED TO, READ BACK BY AND VERIFIED WITH: C SUMME 03/15/16 @ 42 M VESTAL IN BOTH AEROBIC AND ANAEROBIC BOTTLES    Culture (A)  Final    STREPTOCOCCUS GROUP F GRAM VARIABLE ROD CULTURE REINCUBATED FOR BETTER GROWTH Performed at [facility]    Report Status PENDING  Incomplete   Organism ID, Bacteria STREPTOCOCCUS GROUP F  Final      Susceptibility   Streptococcus group f - MIC*    PENICILLIN <=0.06 SENSITIVE Sensitive     CEFTRIAXONE <=0.12 SENSITIVE Sensitive     ERYTHROMYCIN <=0.12 SENSITIVE Sensitive     LEVOFLOXACIN <=0.25 SENSITIVE Sensitive     VANCOMYCIN 0.25 SENSITIVE Sensitive     * STREPTOCOCCUS GROUP F  Blood Culture ID Panel (Reflexed)  Status: Abnormal   Collection Time: 03/13/16 11:35 PM  Result Value Ref Range Status   Enterococcus species NOT DETECTED NOT DETECTED Final   Vancomycin resistance NOT DETECTED NOT DETECTED Final   Listeria monocytogenes NOT DETECTED NOT DETECTED Final   Staphylococcus species NOT DETECTED  NOT DETECTED Final   Staphylococcus aureus NOT DETECTED NOT DETECTED Final   Methicillin resistance NOT DETECTED NOT DETECTED Final   Streptococcus species DETECTED (A) NOT DETECTED Final    Comment: CRITICAL RESULT CALLED TO, READ BACK BY AND VERIFIED WITH: C SUMME 03/15/16 @ 1204 M VESTAL    Streptococcus agalactiae NOT DETECTED NOT DETECTED Final   Streptococcus pneumoniae NOT DETECTED NOT DETECTED Final   Streptococcus pyogenes NOT DETECTED NOT DETECTED Final   Acinetobacter baumannii NOT DETECTED NOT DETECTED Final   Enterobacteriaceae species NOT DETECTED NOT DETECTED Final   Enterobacter cloacae complex NOT DETECTED NOT DETECTED Final   Escherichia coli NOT DETECTED NOT DETECTED Final   Klebsiella oxytoca NOT DETECTED NOT DETECTED Final   Klebsiella pneumoniae NOT DETECTED NOT DETECTED Final   Proteus species NOT DETECTED NOT DETECTED Final   Serratia marcescens NOT DETECTED NOT DETECTED Final   Carbapenem resistance NOT DETECTED NOT DETECTED Final   Haemophilus influenzae NOT DETECTED NOT DETECTED Final   Neisseria meningitidis NOT DETECTED NOT DETECTED Final   Pseudomonas aeruginosa NOT DETECTED NOT DETECTED Final   Candida albicans NOT DETECTED NOT DETECTED Final   Candida glabrata NOT DETECTED NOT DETECTED Final   Candida krusei NOT DETECTED NOT DETECTED Final   Candida parapsilosis NOT DETECTED NOT DETECTED Final   Candida tropicalis NOT DETECTED NOT DETECTED Final    Comment: Performed at [facility]  MRSA PCR Screening     Status: None   Collection Time: 03/14/16  1:58 AM  Result Value Ref Range Status   MRSA by PCR NEGATIVE NEGATIVE Final    Comment:        The GeneXpert MRSA Assay (FDA approved for NASAL specimens only), is one component of a comprehensive MRSA colonization surveillance program. It is not intended to diagnose MRSA infection nor to guide or monitor treatment for MRSA infections.   Culture, blood (routine x 2)     Status: None  (Preliminary result)   Collection Time: 03/15/16  8:46 AM  Result Value Ref Range Status   Specimen Description BLOOD RIGHT HAND  Final   Special Requests BOTTLES DRAWN AEROBIC AND ANAEROBIC 5CC  Final   Culture   Final    NO GROWTH 2 DAYS Performed at [facility]    Report Status PENDING  Incomplete  Culture, blood (routine x 2)     Status: None (Preliminary result)   Collection Time: 03/15/16  8:48 AM  Result Value Ref Range Status   Specimen Description BLOOD LEFT HAND  Final   Special Requests IN PEDIATRIC BOTTLE 2CC  Final   Culture   Final    NO GROWTH 2 DAYS Performed at [facility]    Report Status PENDING  Incomplete   MRI of sacrum 03/15/2016  IMPRESSION: Large sacral decubitus ulcer extends to bone. Low level edema is seen in the distal sacrum and coccyx is compatible with osteomyelitis. Negative for abscess.  Thickened appearance of the walls of the rectosigmoid colon worrisome for proctitis.   By: Inge Rise M.D.  On: 03/15/2016 18:06  ASSESSMENT: She has polymicrobial bacteremia with Escherichia coli, group F strep and a gram variable rod complicating her large sacral decubitus with  sacral and coccygeal osteomyelitis. I will plan on a minimum of 6 weeks of IV antibiotic therapy through 04/23/2016.  PLAN: 1. Continue ertapenem through 04/23/2016 2. Await PICC placement 3. I will arrange follow-up in my clinic  Michel Bickers, [place] for [facility] [phone] pager   [phone] cell 03/18/2016, 11:20 AM

## 2016-03-18 NOTE — Care Management Note (Signed)
Case Management Note  Patient Details  Name: Melene Schrauben MRN: [id number] Date of Birth: 09-Jun-1951  Subjective/Objective:  LTAC [facility]- awaiting if auth by insurance co.Both LTAC's will talk to patient-await patient choice. Awaiting auth, & choice.If auth then please help to d/c to LTAC if auth.                 Action/Plan:d/c plan LTAC if auth.   Expected Discharge Date:                  Expected Discharge Plan:  Long Term Acute Care (LTAC)  In-House Referral:     Discharge planning Services  CM Consult  Post Acute Care Choice:    Choice offered to:  Patient  DME Arranged:    DME Agency:     HH Arranged:    [employer] Agency:     Status of Service:  In process, will continue to follow  Medicare Important Message Given:    Date Medicare IM Given:    Medicare IM give by:    Date Additional Medicare IM Given:    Additional Medicare Important Message give by:     If discussed at [place] of Stay Meetings, dates discussed:    Additional Comments:  Dessa Phi, RN 03/18/2016, 1:33 PM

## 2016-03-19 LAB — CULTURE, BLOOD (ROUTINE X 2)

## 2016-03-19 LAB — GLUCOSE, CAPILLARY
GLUCOSE-CAPILLARY: 129 mg/dL — AB (ref 65–99)
GLUCOSE-CAPILLARY: 127 mg/dL — AB (ref 65–99)
Glucose-Capillary: 139 mg/dL — ABNORMAL HIGH (ref 65–99)
Glucose-Capillary: 140 mg/dL — ABNORMAL HIGH (ref 65–99)

## 2016-03-19 NOTE — Consult Note (Addendum)
WOC wound consult note Reason for Consult:Stage 4 pressure injury to sacrum Wound type:pressure Pressure Ulcer POA: Yes Measurement:4.5cm x 5.5cm x 4cm with surface (full thickness) wound extension at 3 and 9 o'clock measuring 2.5cm x 4cm x 0.2cm (secondary to shearing) Wound bed: Bony prominence is easily palpable using cotton swab; wound bed is <30% red, >70% grey/brown slough. Drainage (amount, consistency, odor) Moderate amount of serosanguinous exudate, odor consistent with necrotic tissue. Periwound: Two lateral areas impacted by shear, noted above. Otherwise intact. Dressing procedure/placement/frequency:I will today provide a bariatric bed with low air loss feature and have taught patient that she will need to spend practically no time at all in the supine position. Hydrotherapy is recommended while in this setting and will be supported at lease initially by twice daily wound care using collagenase (Santyl) which is normally used once daily). Prevention boots are provided as well as an antimicrobial textile to assist with moisture management and fungal overgrowth in the sub pannicular skin fold. Dietary support to maximize protein is in place as is the MVI. In the next setting, seating will need to be addressed.  At that time, perhaps if wound bed is clean, patent should be evaluated for the presence of continuing osteomyelitis and a surgical consult considered. [place] nursing team will not follow closely but will remain available to this patient, the nursing, rehab and medical teams.  Please re-consult if needed. Thanks, Maudie Flakes, MSN, RN, [place], Arther Abbott  Pager# [phone]

## 2016-03-19 NOTE — Progress Notes (Signed)
Nutrition Follow-up  DOCUMENTATION CODES:   Morbid obesity  INTERVENTION:  -Glucerna Shake po TID, each supplement provides 220 kcal and 10 grams of protein -Pro-Stat 23mL daily, each supplement provides 100 calories an 15 grams of protein. -Multivitamin with minerals, daily -Encourage PO intake  NUTRITION DIAGNOSIS:   Increased nutrient needs related to wound healing as evidenced by estimated needs. -ongoing  GOAL:   Patient will meet greater than or equal to 90% of their needs -meeting  MONITOR:   PO intake, Supplement acceptance, Weight trends, Labs, Skin, I & O's  REASON FOR ASSESSMENT:   Malnutrition Screening Tool    ASSESSMENT:   65 y.o. woman with a history of paraplegia x 4 years (etiology unclear, wheelchair bound, chronic indwelling foley catheter), morbid obesity, HTN, DM (diet controlled), and vitamin D deficiency who relocated to this area today with her husband. She apparently came straight to this ED via ambulance shortly after landing. She complains of increased fatigue and recurrent fevers. She also knew that she needed to have her sacral wound evaluated. She has had nausea but no vomiting. She explicitly denies chest pain or shortness of breath. No abdominal pain. No diarrhea. No headaches. No syncope/LOC. Prior to her move, she received home health care for her sacral wound.  Followed up with Sharon Warner today. PO intake has improved - patient states she consumed 100% of breakfast this morning. She states she doesn't like all the food we serve, but attempts to eat most of it. Documented PO 100% last 6/8 meals. She denies nausea/vomiting at this time - seems to have resolved. Denies issues chewing/swallowing No issues tolerating ONS.  Prior to admission we were unsure of pt's wt hx for significant wt loss.  Stated weight on admission was 350# but patient states she has been trying to lose weight and did not know her current weight. She was down  almost 100# from stated weight.  Unfortunately, she has been trending upwards since admission, currently up 23#  Labs and Medications reviewed: NS @ 60mL/hr  Diet Order:  Diet Carb Modified Fluid consistency:: Thin; Room service appropriate?: Yes; Fluid restriction:: 1500 mL Fluid  Skin:  Wound (see comment) (Stage 3-4 sacral pressure ulcer)  Last BM:  6/14  Height:   Ht Readings from Last 1 Encounters:  03/14/16 5\' 1"  (1.549 m)    Weight:   Wt Readings from Last 1 Encounters:  03/19/16 273 lb 5.9 oz (124 kg)    Ideal Body Weight:  47.73 kg (kg)  BMI:  Body mass index is 51.68 kg/(m^2).  Estimated Nutritional Needs:   Kcal:  2100-2300  Protein:  130-140 grams  Fluid:  >/= 2.1 L/day  EDUCATION NEEDS:   No education needs identified at this time  Sharon Warner. Sharon Wittwer, MS, RD LDN Inpatient Clinical Dietitian Pager [phone]

## 2016-03-19 NOTE — Progress Notes (Signed)
PROGRESS NOTE    Sharon Warner  [id number] DOB: April 05, 1951 DOA: 03/13/2016  PCP: [name], MD   Brief Narrative:  65 year old female who is paraplegic with unknown etiology and has a chronic Foley, diabetes mellitus, obstructive sleep apnea, morbid obesity and a sacral ulcer. She was admitted to the hospital for generalized weakness and found to be septic. Source has been determined to be from sacral osteomyelitis. Diabetes is diet controlled. She has private caretakers at home and lives with her husband. Has no sensation below the lumbar spine and cannot move her legs at all. PT recommend she go home with Home health. She is worried about adequate wound care if she is to go home.   Subjective: Had 2 loose stools this AM per RN. She has no sensations below waist and has no pain at this time. No vomiting.   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    Hypokalemia Resolved  Type 2 diabetes mellitus  -Lantus 16 units daily and insulin sliding scale-hemoglobin A1c 8.4-not on medication at home    AKI (acute kidney injury) -Creatinine 1.43 improved to 0.7  -Likely was due to sepsis and dehydration    Lactic acidosis -Resolved    Multiple thyroid nodules -Outpatient follow-up-TSH is normal-free T4 mildly elevated at 1.23    Enlarged thoracic aorta  -4.2 cm measured on CT scan    HTN (hypertension) - resumed metoprolol    Paraplegia with  Neurogenic bladder - Continue Foley catheter-last changed on 6/15 and needs change in 1 month  Candida dermatitis -Groin and upper thigh area-started Diflucan    Obstructive sleep apnea - C Pap    DVT prophylaxis: Lovenox Code Status: Full code Family Communication:  Disposition Plan: Continue to follow in hospital for 1-2 more days- needs PICC Consultants:   ID   Procedures:    Antimicrobials:  Anti-infectives    Start     Dose/Rate Route Frequency Ordered Stop   03/18/16 1500  fluconazole (DIFLUCAN) IVPB 200 mg     200 mg 100 mL/hr over 60 Minutes Intravenous Every 24 hours 03/18/16 1404     03/15/16 1200  ertapenem (INVANZ) 1 g in sodium chloride 0.9 % 50 mL IVPB     1 g 100 mL/hr over 30 Minutes Intravenous Every 24 hours 03/15/16 1105     03/15/16 0600  vancomycin (VANCOCIN) 1,250 mg in sodium chloride 0.9 % 250 mL IVPB  Status:  Discontinued     1,250 mg 166.7 mL/hr over 90 Minutes Intravenous Every 24 hours 03/14/16 0234 03/15/16 1105   03/14/16 0800  metroNIDAZOLE (FLAGYL) IVPB 500 mg  Status:  Discontinued    Comments:  Per pharmacy consult   500 mg 100 mL/hr over 60 Minutes Intravenous Every 8 hours 03/14/16 0215 03/15/16 1105   03/14/16 0800  aztreonam (AZACTAM) 2 g in dextrose 5 % 50 mL IVPB  Status:  Discontinued     2 g 100 mL/hr  over 30 Minutes Intravenous Every 8 hours 03/14/16 0226 03/15/16 1105   03/14/16 0300  vancomycin (VANCOCIN) 2,500 mg in sodium chloride 0.9 % 500 mL IVPB     2,500 mg 250 mL/hr over 120 Minutes Intravenous  Once 03/14/16 0229 03/14/16 0500   03/13/16 2330  metroNIDAZOLE (FLAGYL) IVPB 500 mg     500 mg 100 mL/hr over 60 Minutes Intravenous  Once 03/13/16 2257 03/14/16 0112   03/13/16 2300  aztreonam (AZACTAM) 2 g in dextrose 5 % 50 mL IVPB     2 g 100 mL/hr over 30 Minutes Intravenous STAT 03/13/16 2257 03/14/16 0013       Objective: Filed Vitals:   03/19/16 0531 03/19/16 0542 03/19/16 0752 03/19/16 1500  BP:  153/82  113/68 147/78  Pulse:  74 69 93  Temp:  97.6 F (36.4 C) 98.3 F (36.8 C) 98.2 F (36.8 C)  TempSrc:  Oral Oral Oral  Resp:  19 21   Height:      Weight: 124 kg (273 lb 5.9 oz)     SpO2:  100% 95% 95%    Intake/Output Summary (Last 24 hours) at 03/19/16 1553 Last data filed at 03/19/16 0930  Gross per 24 hour  Intake 393.75 ml  Output   1850 ml  Net -1456.25 ml   Filed Weights   03/16/16 0500 03/17/16 0454 03/19/16 0531  Weight: 118.3 kg (260 lb 12.9 oz) 122.8 kg (270 lb 11.6 oz) 124 kg (273 lb 5.9 oz)    Examination: General exam: Appears comfortable -Morbidly obese HEENT: PERRLA, oral mucosa moist, no sclera icterus or thrush Respiratory system: Clear to auscultation. Respiratory effort normal. Cardiovascular system: S1 & S2 heard, RRR.  No murmurs  Gastrointestinal system: Abdomen soft, non-tender, nondistended. Normal bowel sound. No organomegaly Central nervous system: Alert and oriented. No focal neurological deficits. Extremities: No cyanosis, clubbing or edema Skin: Stage IV sacral decubitus ulcer measuring about 8 cm in diameter Psychiatry:  Mood & affect appropriate.     Data Reviewed: I have personally reviewed following labs and imaging studies  CBC:  Recent Labs Lab 03/13/16 2247 03/14/16 0242 03/14/16 0512 03/15/16 0309 03/16/16 0309 03/17/16 0523  WBC 23.5* 29.9* 33.0* 15.1* 11.6* 12.5*  NEUTROABS 21.1*  --   --  11.3* 7.2  --   HGB 13.3 11.9* 11.5* 11.0* 10.4* 10.5*  HCT 40.6 37.1 35.5* 33.2* 32.6* 33.2*  MCV 82.9 84.1 83.3 81.2 84.2 84.7  PLT 452* 366 341 318 297 [id number]   Basic Metabolic Panel:  Recent Labs Lab 03/13/16 2247 03/14/16 0242 03/14/16 0512 03/14/16 1353 03/15/16 0309 03/16/16 0309 03/17/16 0523  NA 130*  --  129*  --  135 137 137  K 2.9*  --  2.9* 3.9 3.8 3.3* 4.0  CL 94*  --  97*  --  107 107 109  CO2 20*  --  20*  --  19* 23 20*  GLUCOSE 285*  --  324*  --  233* 217* 169*  BUN 22*  --  23*  --  23* 21* 19    CREATININE 1.35* 1.43* 1.34*  --  1.00 0.80 0.70  CALCIUM 10.4*  --  9.4  --  9.3 9.6 10.0  MG  --   --   --   --  1.8 1.7  --    GFR: Estimated Creatinine Clearance: 90.1 mL/min (by C-G formula based on Cr of 0.7). Liver Function Tests:  Recent Labs Lab 03/13/16 2247 03/15/16 0309  AST 39 21  ALT 21 16  ALKPHOS 88 51  BILITOT 1.1 0.5  PROT 7.5 6.1*  ALBUMIN 3.2* 2.5*   No results for input(s): LIPASE, AMYLASE in the last 168 hours. No results for input(s): AMMONIA in the last 168 hours. Coagulation Profile:  Recent Labs Lab 03/14/16 0242 03/15/16 0309  INR 1.53* 1.53*   Cardiac Enzymes:  Recent Labs Lab 03/13/16 2247 03/15/16 0309  CKTOTAL  --  113  TROPONINI 0.03  --    BNP (last 3 results) No results for input(s): PROBNP in the last 8760 hours. HbA1C: No results for input(s): HGBA1C in the last 72 hours. CBG:  Recent Labs Lab 03/18/16 1334 03/18/16 1721 03/18/16 2113 03/19/16 0741 03/19/16 1229  GLUCAP 133* 144* 141* 139* 140*   Lipid Profile: No results for input(s): CHOL, HDL, LDLCALC, TRIG, CHOLHDL, LDLDIRECT in the last 72 hours. Thyroid Function Tests: No results for input(s): TSH, T4TOTAL, FREET4, T3FREE, THYROIDAB in the last 72 hours. Anemia Panel: No results for input(s): VITAMINB12, FOLATE, FERRITIN, TIBC, IRON, RETICCTPCT in the last 72 hours. Urine analysis:    Component Value Date/Time   COLORURINE YELLOW 03/13/2016 2241   APPEARANCEUR TURBID* 03/13/2016 2241   LABSPEC 1.011 03/13/2016 2241   PHURINE 6.0 03/13/2016 2241   GLUCOSEU NEGATIVE 03/13/2016 2241   HGBUR MODERATE* 03/13/2016 2241   BILIRUBINUR NEGATIVE 03/13/2016 2241   KETONESUR NEGATIVE 03/13/2016 2241   PROTEINUR 100* 03/13/2016 2241   NITRITE NEGATIVE 03/13/2016 2241   LEUKOCYTESUR LARGE* 03/13/2016 2241   Sepsis Labs: @LABRCNTIP (procalcitonin:4,lacticidven:4) ) Recent Results (from the past 240 hour(s))  Wound or Superficial Culture     Status: None    Collection Time: 03/13/16 10:38 PM  Result Value Ref Range Status   Specimen Description DECUBITIS  Final   Special Requests Normal  Final   Gram Stain   Final    RARE WBC PRESENT, PREDOMINANTLY PMN MODERATE GRAM POSITIVE COCCI IN PAIRS FEW GRAM VARIABLE ROD    Culture   Final    FEW PROTEUS MIRABILIS NO STAPHYLOCOCCUS AUREUS ISOLATED NO GROUP A STREP (S.PYOGENES) ISOLATED Performed at [facility]    Report Status 03/17/2016 FINAL  Final   Organism ID, Bacteria PROTEUS MIRABILIS  Final      Susceptibility   Proteus mirabilis - MIC*    AMPICILLIN <=2 SENSITIVE Sensitive     CEFAZOLIN <=4 SENSITIVE Sensitive     CEFEPIME <=1 SENSITIVE Sensitive     CEFTAZIDIME <=1 SENSITIVE Sensitive     CEFTRIAXONE <=1 SENSITIVE Sensitive     CIPROFLOXACIN 1 SENSITIVE Sensitive     GENTAMICIN <=1 SENSITIVE Sensitive     IMIPENEM 4 SENSITIVE Sensitive     TRIMETH/SULFA <=20 SENSITIVE Sensitive     AMPICILLIN/SULBACTAM <=2 SENSITIVE Sensitive     PIP/TAZO <=4 SENSITIVE Sensitive     * FEW PROTEUS MIRABILIS  Urine culture     Status: Abnormal   Collection Time: 03/13/16 10:42 PM  Result Value Ref Range Status   Specimen Description URINE, CATHETERIZED  Final   Special Requests NONE  Final   Culture MULTIPLE SPECIES PRESENT, SUGGEST RECOLLECTION (A)  Final   Report Status 03/15/2016 FINAL  Final  Blood Culture (routine x 2)     Status: Abnormal   Collection Time: 03/13/16 10:49 PM  Result Value Ref Range Status   Specimen Description LEFT ANTECUBITAL  Final   Special Requests BOTTLES DRAWN AEROBIC AND ANAEROBIC 5CC  Final   Culture  Setup Time   Final  GRAM NEGATIVE RODS GRAM POSITIVE COCCI IN PAIRS ANAEROBIC BOTTLE ONLY CRITICAL RESULT CALLED TO, READ BACK BY AND VERIFIED WITH: BETH GREEN,PHARMD @0135  03/15/16 MKELLY GRAM NEGATIVE RODS AEROBIC BOTTLE ONLY Performed at [facility]    Culture (A)  Final    ESCHERICHIA COLI GRAM POSITIVE COCCI ORGANISM NOT  VIABLE DIPHTHEROIDS(CORYNEBACTERIUM SPECIES)    Report Status 03/19/2016 FINAL  Final   Organism ID, Bacteria ESCHERICHIA COLI  Final      Susceptibility   Escherichia coli - MIC*    AMPICILLIN >=32 RESISTANT Resistant     CEFAZOLIN 8 SENSITIVE Sensitive     CEFEPIME <=1 SENSITIVE Sensitive     CEFTAZIDIME <=1 SENSITIVE Sensitive     CEFTRIAXONE <=1 SENSITIVE Sensitive     CIPROFLOXACIN <=0.25 SENSITIVE Sensitive     GENTAMICIN <=1 SENSITIVE Sensitive     IMIPENEM <=0.25 SENSITIVE Sensitive     TRIMETH/SULFA >=320 RESISTANT Resistant     AMPICILLIN/SULBACTAM >=32 RESISTANT Resistant     PIP/TAZO 16 SENSITIVE Sensitive     * ESCHERICHIA COLI  Blood Culture ID Panel (Reflexed)     Status: Abnormal   Collection Time: 03/13/16 10:49 PM  Result Value Ref Range Status   Enterococcus species NOT DETECTED NOT DETECTED Final   Vancomycin resistance NOT DETECTED NOT DETECTED Final   Listeria monocytogenes NOT DETECTED NOT DETECTED Final   Staphylococcus species NOT DETECTED NOT DETECTED Final   Staphylococcus aureus NOT DETECTED NOT DETECTED Final   Methicillin resistance NOT DETECTED NOT DETECTED Final   Streptococcus species DETECTED (A) NOT DETECTED Final    Comment: CRITICAL RESULT CALLED TO, READ BACK BY AND VERIFIED WITH: BETH GREEN, PHARMD @0135  03/15/16 MKELLY    Streptococcus agalactiae NOT DETECTED NOT DETECTED Final   Streptococcus pneumoniae NOT DETECTED NOT DETECTED Final   Streptococcus pyogenes NOT DETECTED NOT DETECTED Final   Acinetobacter baumannii NOT DETECTED NOT DETECTED Final   Enterobacteriaceae species DETECTED (A) NOT DETECTED Final    Comment: CRITICAL RESULT CALLED TO, READ BACK BY AND VERIFIED WITH: BETH GREEN, PHARMD @0135  03/15/16 MKELLY    Enterobacter cloacae complex NOT DETECTED NOT DETECTED Final   Escherichia coli DETECTED (A) NOT DETECTED Final    Comment: CRITICAL RESULT CALLED TO, READ BACK BY AND VERIFIED WITH: BETH GREEN, PHARMD @0135  03/15/16  MKELLY    Klebsiella oxytoca NOT DETECTED NOT DETECTED Final   Klebsiella pneumoniae NOT DETECTED NOT DETECTED Final   Proteus species NOT DETECTED NOT DETECTED Final   Serratia marcescens NOT DETECTED NOT DETECTED Final   Carbapenem resistance NOT DETECTED NOT DETECTED Final   Haemophilus influenzae NOT DETECTED NOT DETECTED Final   Neisseria meningitidis NOT DETECTED NOT DETECTED Final   Pseudomonas aeruginosa NOT DETECTED NOT DETECTED Final   Candida albicans NOT DETECTED NOT DETECTED Final   Candida glabrata NOT DETECTED NOT DETECTED Final   Candida krusei NOT DETECTED NOT DETECTED Final   Candida parapsilosis NOT DETECTED NOT DETECTED Final   Candida tropicalis NOT DETECTED NOT DETECTED Final  Blood Culture (routine x 2)     Status: Abnormal (Preliminary result)   Collection Time: 03/13/16 11:35 PM  Result Value Ref Range Status   Specimen Description BLOOD RIGHT FOREARM  Final   Special Requests BOTTLES DRAWN AEROBIC AND ANAEROBIC 5CC  Final   Culture  Setup Time   Final    GRAM VARIABLE ROD ONLY IN ANAEROBIC BOTTLE GRAM POSITIVE COCCI IN PAIRS IN CHAINS CRITICAL RESULT CALLED TO, READ BACK BY AND VERIFIED WITH: C  SUMME 03/15/16 @ [place]    Culture (A)  Final    STREPTOCOCCUS GROUP F GRAM VARIABLE ROD CULTURE REINCUBATED FOR BETTER GROWTH Performed at [facility]    Report Status PENDING  Incomplete   Organism ID, Bacteria STREPTOCOCCUS GROUP F  Final      Susceptibility   Streptococcus group f - MIC*    PENICILLIN <=0.06 SENSITIVE Sensitive     CEFTRIAXONE <=0.12 SENSITIVE Sensitive     ERYTHROMYCIN <=0.12 SENSITIVE Sensitive     LEVOFLOXACIN <=0.25 SENSITIVE Sensitive     VANCOMYCIN 0.25 SENSITIVE Sensitive     * STREPTOCOCCUS GROUP F  Blood Culture ID Panel (Reflexed)     Status: Abnormal   Collection Time: 03/13/16 11:35 PM  Result Value Ref Range Status   Enterococcus species NOT DETECTED NOT DETECTED Final    Vancomycin resistance NOT DETECTED NOT DETECTED Final   Listeria monocytogenes NOT DETECTED NOT DETECTED Final   Staphylococcus species NOT DETECTED NOT DETECTED Final   Staphylococcus aureus NOT DETECTED NOT DETECTED Final   Methicillin resistance NOT DETECTED NOT DETECTED Final   Streptococcus species DETECTED (A) NOT DETECTED Final    Comment: CRITICAL RESULT CALLED TO, READ BACK BY AND VERIFIED WITH: C SUMME 03/15/16 @ 1204 M VESTAL    Streptococcus agalactiae NOT DETECTED NOT DETECTED Final   Streptococcus pneumoniae NOT DETECTED NOT DETECTED Final   Streptococcus pyogenes NOT DETECTED NOT DETECTED Final   Acinetobacter baumannii NOT DETECTED NOT DETECTED Final   Enterobacteriaceae species NOT DETECTED NOT DETECTED Final   Enterobacter cloacae complex NOT DETECTED NOT DETECTED Final   Escherichia coli NOT DETECTED NOT DETECTED Final   Klebsiella oxytoca NOT DETECTED NOT DETECTED Final   Klebsiella pneumoniae NOT DETECTED NOT DETECTED Final   Proteus species NOT DETECTED NOT DETECTED Final   Serratia marcescens NOT DETECTED NOT DETECTED Final   Carbapenem resistance NOT DETECTED NOT DETECTED Final   Haemophilus influenzae NOT DETECTED NOT DETECTED Final   Neisseria meningitidis NOT DETECTED NOT DETECTED Final   Pseudomonas aeruginosa NOT DETECTED NOT DETECTED Final   Candida albicans NOT DETECTED NOT DETECTED Final   Candida glabrata NOT DETECTED NOT DETECTED Final   Candida krusei NOT DETECTED NOT DETECTED Final   Candida parapsilosis NOT DETECTED NOT DETECTED Final   Candida tropicalis NOT DETECTED NOT DETECTED Final    Comment: Performed at [facility]  MRSA PCR Screening     Status: None   Collection Time: 03/14/16  1:58 AM  Result Value Ref Range Status   MRSA by PCR NEGATIVE NEGATIVE Final    Comment:        The GeneXpert MRSA Assay (FDA approved for NASAL specimens only), is one component of a comprehensive MRSA colonization surveillance program. It is  not intended to diagnose MRSA infection nor to guide or monitor treatment for MRSA infections.   Culture, blood (routine x 2)     Status: None (Preliminary result)   Collection Time: 03/15/16  8:46 AM  Result Value Ref Range Status   Specimen Description BLOOD RIGHT HAND  Final   Special Requests BOTTLES DRAWN AEROBIC AND ANAEROBIC 5CC  Final   Culture   Final    NO GROWTH 4 DAYS Performed at [facility]    Report Status PENDING  Incomplete  Culture, blood (routine x 2)     Status: None (Preliminary result)   Collection Time: 03/15/16  8:48 AM  Result Value Ref  Range Status   Specimen Description BLOOD LEFT HAND  Final   Special Requests IN PEDIATRIC BOTTLE 2CC  Final   Culture   Final    NO GROWTH 4 DAYS Performed at [facility]    Report Status PENDING  Incomplete         Radiology Studies: No results found.    Scheduled Meds: . antiseptic oral rinse  7 mL Mouth Rinse q12n4p  . aspirin EC  81 mg Oral Daily  . chlorhexidine  15 mL Mouth Rinse BID  . collagenase   Topical Daily  . enoxaparin (LOVENOX) injection  0.5 mg/kg Subcutaneous Q24H  . ertapenem  1 g Intravenous Q24H  . feeding supplement (GLUCERNA SHAKE)  237 mL Oral TID BM  . feeding supplement (PRO-STAT SUGAR FREE 64)  30 mL Oral Daily  . fluconazole (DIFLUCAN) IV  200 mg Intravenous Q24H  . insulin aspart  0-20 Units Subcutaneous TID WC  . insulin aspart  0-5 Units Subcutaneous QHS  . insulin glargine  16 Units Subcutaneous Daily  . metoprolol succinate  100 mg Oral Daily  . multivitamin with minerals  1 tablet Oral Daily  . saccharomyces boulardii  250 mg Oral BID   Continuous Infusions: . sodium chloride 1,000 mL (03/19/16 0052)     LOS: 5 days    Time spent in minutes: 39    [place], MD [facility] Pager: [email or web address] Password [facility] 03/19/2016, 3:53 PM

## 2016-03-19 NOTE — Progress Notes (Signed)
Patient ID: Sharon Warner, female   DOB: [birth year], 65 y.o.   MRN: [id number]         [facility]    Date of Admission:  03/13/2016   Total days of antibiotics 7        Day 5 ertapenem  She is currently sleeping and I did not wake her. She is started having some diarrhea today and her nurse is attempting to collect a specimen for C. difficile screen. The IV team was unable to place a PICC. We will need to ask interventional radiology to help place a central line. She needs at least 5 more weeks of antibiotic therapy for her polymicrobial bacteremia complicating sacral and coccygeal osteomyelitis.         Michel Bickers, MD [facility] [place] [facility] [phone] pager   [phone] cell 10/03/2015, 1:32 PM

## 2016-03-20 DIAGNOSIS — L8990 Pressure ulcer of unspecified site, unspecified stage: Secondary | ICD-10-CM

## 2016-03-20 LAB — CULTURE, BLOOD (ROUTINE X 2)
Culture: NO GROWTH
Culture: NO GROWTH

## 2016-03-20 LAB — CBC
HCT: 34.4 % — ABNORMAL LOW (ref 36.0–46.0)
Hemoglobin: 10.9 g/dL — ABNORMAL LOW (ref 12.0–15.0)
MCH: 27.1 pg (ref 26.0–34.0)
MCHC: 31.7 g/dL (ref 30.0–36.0)
MCV: 85.6 fL (ref 78.0–100.0)
Platelets: 320 10*3/uL (ref 150–400)
RBC: 4.02 MIL/uL (ref 3.87–5.11)
RDW: 15.8 % — AB (ref 11.5–15.5)
WBC: 13.5 10*3/uL — ABNORMAL HIGH (ref 4.0–10.5)

## 2016-03-20 LAB — BASIC METABOLIC PANEL
Anion gap: 5 (ref 5–15)
BUN: 13 mg/dL (ref 6–20)
CALCIUM: 9.9 mg/dL (ref 8.9–10.3)
CO2: 24 mmol/L (ref 22–32)
CREATININE: 0.65 mg/dL (ref 0.44–1.00)
Chloride: 110 mmol/L (ref 101–111)
GFR calc non Af Amer: >60 mL/min (ref >60)
Glucose, Bld: 146 mg/dL — ABNORMAL HIGH (ref 65–99)
Potassium: 3.4 mmol/L — ABNORMAL LOW (ref 3.5–5.1)
SODIUM: 139 mmol/L (ref 135–145)

## 2016-03-20 LAB — GLUCOSE, CAPILLARY
GLUCOSE-CAPILLARY: 184 mg/dL — AB (ref 65–99)
Glucose-Capillary: 122 mg/dL — ABNORMAL HIGH (ref 65–99)
Glucose-Capillary: 163 mg/dL — ABNORMAL HIGH (ref 65–99)
Glucose-Capillary: 141 mg/dL — ABNORMAL HIGH (ref 65–99)

## 2016-03-20 MED ORDER — METRONIDAZOLE 500 MG PO TABS
500.0000 mg | ORAL_TABLET | Freq: Four times a day (QID) | ORAL | Status: DC
  Administered 2016-03-20 – 2016-03-21 (×4): 500 mg via ORAL
  Filled 2016-03-20 (×7): qty 1

## 2016-03-20 MED ORDER — INSULIN ASPART 100 UNIT/ML ~~LOC~~ SOLN: 0.0000 [IU] | Freq: Three times a day (TID) | SUBCUTANEOUS | Status: DC

## 2016-03-20 MED ORDER — METOPROLOL SUCCINATE ER 100 MG PO TB24: 100.0000 mg | ORAL_TABLET | Freq: Every day | ORAL | Status: DC

## 2016-03-20 MED ORDER — ENOXAPARIN SODIUM 60 MG/0.6ML ~~LOC~~ SOLN
0.5000 mg/kg | SUBCUTANEOUS | Status: DC
  Administered 2016-03-20: 60 mg via SUBCUTANEOUS
  Filled 2016-03-20 (×2): qty 0.6

## 2016-03-20 MED ORDER — SACCHAROMYCES BOULARDII 250 MG PO CAPS: 250.0000 mg | ORAL_CAPSULE | Freq: Two times a day (BID) | ORAL | Status: DC

## 2016-03-20 MED ORDER — INSULIN GLARGINE 100 UNIT/ML ~~LOC~~ SOLN: 16.0000 [IU] | Freq: Every day | SUBCUTANEOUS | Status: DC

## 2016-03-20 MED ORDER — GLUCERNA SHAKE PO LIQD: 237.0000 mL | Freq: Three times a day (TID) | ORAL | Status: DC

## 2016-03-20 MED ORDER — FUROSEMIDE 10 MG/ML IJ SOLN
40.0000 mg | Freq: Once | INTRAMUSCULAR | Status: AC
  Administered 2016-03-20: 40 mg via INTRAVENOUS
  Filled 2016-03-20: qty 4

## 2016-03-20 MED ORDER — CEFTRIAXONE SODIUM 2 G IJ SOLR
2.0000 g | INTRAMUSCULAR | Status: DC
  Administered 2016-03-20: 2 g via INTRAVENOUS
  Filled 2016-03-20 (×2): qty 2

## 2016-03-20 MED ORDER — FLUCONAZOLE 200 MG PO TABS
200.0000 mg | ORAL_TABLET | Freq: Every day | ORAL | Status: DC
  Administered 2016-03-20 – 2016-03-21 (×2): 200 mg via ORAL
  Filled 2016-03-20 (×2): qty 1

## 2016-03-20 MED ORDER — SODIUM CHLORIDE 0.9 % IV SOLN: 1.0000 g | INTRAVENOUS | Status: DC

## 2016-03-20 NOTE — Progress Notes (Signed)
Patient ID: Sharon Warner, female   DOB: 1950-10-12, 65 y.o.   MRN: [id number]         [facility]    Date of Admission:  03/13/2016   Total days of antibiotics 8        Day 6 ertapenem         Principal Problem:   Sacral osteomyelitis (HCC) Active Problems:   Severe sepsis (HCC)   AKI (acute kidney injury) ([place])   Lactic acidosis   Infected decubitus ulcer   Bacteremia   Paraplegia (HCC)   Morbid obesity (HCC)   Hypokalemia   Type 2 diabetes mellitus (HCC)   Multiple thyroid nodules   Enlarged thoracic aorta (HCC)   HTN (hypertension)   Pressure ulcer   Neurogenic bladder   Obstructive sleep apnea   Normocytic anemia   Proctitis   Diarrhea   Pyogenic inflammation of bone ([place])   Decubital ulcer   . antiseptic oral rinse  7 mL Mouth Rinse q12n4p  . aspirin EC  81 mg Oral Daily  . chlorhexidine  15 mL Mouth Rinse BID  . collagenase   Topical Daily  . enoxaparin (LOVENOX) injection  0.5 mg/kg Subcutaneous Q24H  . ertapenem  1 g Intravenous Q24H  . feeding supplement (GLUCERNA SHAKE)  237 mL Oral TID BM  . feeding supplement (PRO-STAT SUGAR FREE 64)  30 mL Oral Daily  . fluconazole  200 mg Oral Daily  . insulin aspart  0-20 Units Subcutaneous TID WC  . insulin aspart  0-5 Units Subcutaneous QHS  . insulin glargine  16 Units Subcutaneous Daily  . metoprolol succinate  100 mg Oral Daily  . multivitamin with minerals  1 tablet Oral Daily  . saccharomyces boulardii  250 mg Oral BID    SUBJECTIVE: She states that she is very sleepy but otherwise feeling well. The IV team was unable to place a PICC yesterday. Apparently the plan is to ask interventional radiology to place a central line today and then she will be discharged to [name] for wound care and IV antibiotics. She is no longer having any diarrhea.  Review of Systems: Review of Systems  Unable to perform ROS   Past Medical History  Diagnosis Date  . Hypertension    . Diabetes mellitus without complication ([place])   . H/O paraplegia     ascending paralysis unclear etiology  . Chronic indwelling Foley catheter     last changed few days ago  . Morbid obesity ([place])   . Lumbar spondylosis   . OSA on CPAP   . Chronic heart failure [facility])     Social History  Substance Use Topics  . Smoking status: Never Smoker   . Smokeless tobacco: None  . Alcohol Use: No    Family History  Problem Relation Age of Onset  . Hypothyroidism Mother   . Diabetes Maternal Grandmother   . Heart disease Maternal Grandfather    Allergies  Allergen Reactions  . Levaquin [Levofloxacin In D5w] Other (See Comments)    Unknown childhood allergy  . Penicillins Other (See Comments)    Unknown childhood allergy    OBJECTIVE: Filed Vitals:   03/19/16 1500 03/19/16 2032 03/20/16 0613 03/20/16 1517  BP: 147/78 154/74 140/67 131/67  Pulse: 93 81 79 80  Temp: 98.2 F (36.8 C) 98.3 F (36.8 C) 98.4 F (36.9 C) 99.4 F (37.4 C)  TempSrc: Oral Oral Oral Axillary  Resp:  19 19 20   Height:  Weight:      SpO2: 95% 97% 99% 95%   Body mass index is 51.68 kg/(m^2).  Physical Exam  Constitutional:  She is sleeping but arouses easily and is in no distress.  Musculoskeletal:  Sacral wound not examined.    Lab Results Lab Results  Component Value Date   WBC 13.5* 03/20/2016   HGB 10.9* 03/20/2016   HCT 34.4* 03/20/2016   MCV 85.6 03/20/2016   PLT 320 03/20/2016    Lab Results  Component Value Date   CREATININE 0.65 03/20/2016   BUN 13 03/20/2016   NA 139 03/20/2016   K 3.4* 03/20/2016   CL 110 03/20/2016   CO2 24 03/20/2016    Lab Results  Component Value Date   ALT 16 03/15/2016   AST 21 03/15/2016   ALKPHOS 51 03/15/2016   BILITOT 0.5 03/15/2016     Microbiology: Recent Results (from the past 240 hour(s))  Wound or Superficial Culture     Status: None   Collection Time: 03/13/16 10:38 PM  Result Value Ref Range Status   Specimen Description  DECUBITIS  Final   Special Requests Normal  Final   Gram Stain   Final    RARE WBC PRESENT, PREDOMINANTLY PMN MODERATE GRAM POSITIVE COCCI IN PAIRS FEW GRAM VARIABLE ROD    Culture   Final    FEW PROTEUS MIRABILIS NO STAPHYLOCOCCUS AUREUS ISOLATED NO GROUP A STREP (S.PYOGENES) ISOLATED Performed at [facility]    Report Status 03/17/2016 FINAL  Final   Organism ID, Bacteria PROTEUS MIRABILIS  Final      Susceptibility   Proteus mirabilis - MIC*    AMPICILLIN <=2 SENSITIVE Sensitive     CEFAZOLIN <=4 SENSITIVE Sensitive     CEFEPIME <=1 SENSITIVE Sensitive     CEFTAZIDIME <=1 SENSITIVE Sensitive     CEFTRIAXONE <=1 SENSITIVE Sensitive     CIPROFLOXACIN 1 SENSITIVE Sensitive     GENTAMICIN <=1 SENSITIVE Sensitive     IMIPENEM 4 SENSITIVE Sensitive     TRIMETH/SULFA <=20 SENSITIVE Sensitive     AMPICILLIN/SULBACTAM <=2 SENSITIVE Sensitive     PIP/TAZO <=4 SENSITIVE Sensitive     * FEW PROTEUS MIRABILIS  Urine culture     Status: Abnormal   Collection Time: 03/13/16 10:42 PM  Result Value Ref Range Status   Specimen Description URINE, CATHETERIZED  Final   Special Requests NONE  Final   Culture MULTIPLE SPECIES PRESENT, SUGGEST RECOLLECTION (A)  Final   Report Status 03/15/2016 FINAL  Final  Blood Culture (routine x 2)     Status: Abnormal   Collection Time: 03/13/16 10:49 PM  Result Value Ref Range Status   Specimen Description LEFT ANTECUBITAL  Final   Special Requests BOTTLES DRAWN AEROBIC AND ANAEROBIC 5CC  Final   Culture  Setup Time   Final    GRAM NEGATIVE RODS GRAM POSITIVE COCCI IN PAIRS ANAEROBIC BOTTLE ONLY CRITICAL RESULT CALLED TO, READ BACK BY AND VERIFIED WITH: BETH GREEN,PHARMD @0135  03/15/16 MKELLY GRAM NEGATIVE RODS AEROBIC BOTTLE ONLY Performed at [facility]    Culture (A)  Final    ESCHERICHIA COLI GRAM POSITIVE COCCI ORGANISM NOT VIABLE DIPHTHEROIDS(CORYNEBACTERIUM SPECIES)    Report Status 03/19/2016 FINAL  Final   Organism ID,  Bacteria ESCHERICHIA COLI  Final      Susceptibility   Escherichia coli - MIC*    AMPICILLIN >=32 RESISTANT Resistant     CEFAZOLIN 8 SENSITIVE Sensitive     CEFEPIME <=1 SENSITIVE Sensitive  CEFTAZIDIME <=1 SENSITIVE Sensitive     CEFTRIAXONE <=1 SENSITIVE Sensitive     CIPROFLOXACIN <=0.25 SENSITIVE Sensitive     GENTAMICIN <=1 SENSITIVE Sensitive     IMIPENEM <=0.25 SENSITIVE Sensitive     TRIMETH/SULFA >=320 RESISTANT Resistant     AMPICILLIN/SULBACTAM >=32 RESISTANT Resistant     PIP/TAZO 16 SENSITIVE Sensitive     * ESCHERICHIA COLI  Blood Culture ID Panel (Reflexed)     Status: Abnormal   Collection Time: 03/13/16 10:49 PM  Result Value Ref Range Status   Enterococcus species NOT DETECTED NOT DETECTED Final   Vancomycin resistance NOT DETECTED NOT DETECTED Final   Listeria monocytogenes NOT DETECTED NOT DETECTED Final   Staphylococcus species NOT DETECTED NOT DETECTED Final   Staphylococcus aureus NOT DETECTED NOT DETECTED Final   Methicillin resistance NOT DETECTED NOT DETECTED Final   Streptococcus species DETECTED (A) NOT DETECTED Final    Comment: CRITICAL RESULT CALLED TO, READ BACK BY AND VERIFIED WITH: BETH GREEN, PHARMD @0135  03/15/16 MKELLY    Streptococcus agalactiae NOT DETECTED NOT DETECTED Final   Streptococcus pneumoniae NOT DETECTED NOT DETECTED Final   Streptococcus pyogenes NOT DETECTED NOT DETECTED Final   Acinetobacter baumannii NOT DETECTED NOT DETECTED Final   Enterobacteriaceae species DETECTED (A) NOT DETECTED Final    Comment: CRITICAL RESULT CALLED TO, READ BACK BY AND VERIFIED WITH: BETH GREEN, PHARMD @0135  03/15/16 MKELLY    Enterobacter cloacae complex NOT DETECTED NOT DETECTED Final   Escherichia coli DETECTED (A) NOT DETECTED Final    Comment: CRITICAL RESULT CALLED TO, READ BACK BY AND VERIFIED WITH: BETH GREEN, PHARMD @0135  03/15/16 MKELLY    Klebsiella oxytoca NOT DETECTED NOT DETECTED Final   Klebsiella pneumoniae NOT DETECTED NOT  DETECTED Final   Proteus species NOT DETECTED NOT DETECTED Final   Serratia marcescens NOT DETECTED NOT DETECTED Final   Carbapenem resistance NOT DETECTED NOT DETECTED Final   Haemophilus influenzae NOT DETECTED NOT DETECTED Final   Neisseria meningitidis NOT DETECTED NOT DETECTED Final   Pseudomonas aeruginosa NOT DETECTED NOT DETECTED Final   Candida albicans NOT DETECTED NOT DETECTED Final   Candida glabrata NOT DETECTED NOT DETECTED Final   Candida krusei NOT DETECTED NOT DETECTED Final   Candida parapsilosis NOT DETECTED NOT DETECTED Final   Candida tropicalis NOT DETECTED NOT DETECTED Final  Blood Culture (routine x 2)     Status: Abnormal   Collection Time: 03/13/16 11:35 PM  Result Value Ref Range Status   Specimen Description BLOOD RIGHT FOREARM  Final   Special Requests BOTTLES DRAWN AEROBIC AND ANAEROBIC 5CC  Final   Culture  Setup Time   Final    GRAM VARIABLE ROD ONLY IN ANAEROBIC BOTTLE GRAM POSITIVE COCCI IN PAIRS IN CHAINS CRITICAL RESULT CALLED TO, READ BACK BY AND VERIFIED WITH: C SUMME 03/15/16 @ 1204 M VESTAL IN BOTH AEROBIC AND ANAEROBIC BOTTLES Performed at [facility]    Culture STREPTOCOCCUS GROUP F CLOSTRIDIUM RAMOSUM  (A)  Final   Report Status 03/20/2016 FINAL  Final   Organism ID, Bacteria STREPTOCOCCUS GROUP F  Final      Susceptibility   Streptococcus group f - MIC*    PENICILLIN <=0.06 SENSITIVE Sensitive     CEFTRIAXONE <=0.12 SENSITIVE Sensitive     ERYTHROMYCIN <=0.12 SENSITIVE Sensitive     LEVOFLOXACIN <=0.25 SENSITIVE Sensitive     VANCOMYCIN 0.25 SENSITIVE Sensitive     * STREPTOCOCCUS GROUP F  Blood Culture ID Panel (Reflexed)  Status: Abnormal   Collection Time: 03/13/16 11:35 PM  Result Value Ref Range Status   Enterococcus species NOT DETECTED NOT DETECTED Final   Vancomycin resistance NOT DETECTED NOT DETECTED Final   Listeria monocytogenes NOT DETECTED NOT DETECTED Final   Staphylococcus species NOT DETECTED NOT  DETECTED Final   Staphylococcus aureus NOT DETECTED NOT DETECTED Final   Methicillin resistance NOT DETECTED NOT DETECTED Final   Streptococcus species DETECTED (A) NOT DETECTED Final    Comment: CRITICAL RESULT CALLED TO, READ BACK BY AND VERIFIED WITH: C SUMME 03/15/16 @ 1204 M VESTAL    Streptococcus agalactiae NOT DETECTED NOT DETECTED Final   Streptococcus pneumoniae NOT DETECTED NOT DETECTED Final   Streptococcus pyogenes NOT DETECTED NOT DETECTED Final   Acinetobacter baumannii NOT DETECTED NOT DETECTED Final   Enterobacteriaceae species NOT DETECTED NOT DETECTED Final   Enterobacter cloacae complex NOT DETECTED NOT DETECTED Final   Escherichia coli NOT DETECTED NOT DETECTED Final   Klebsiella oxytoca NOT DETECTED NOT DETECTED Final   Klebsiella pneumoniae NOT DETECTED NOT DETECTED Final   Proteus species NOT DETECTED NOT DETECTED Final   Serratia marcescens NOT DETECTED NOT DETECTED Final   Carbapenem resistance NOT DETECTED NOT DETECTED Final   Haemophilus influenzae NOT DETECTED NOT DETECTED Final   Neisseria meningitidis NOT DETECTED NOT DETECTED Final   Pseudomonas aeruginosa NOT DETECTED NOT DETECTED Final   Candida albicans NOT DETECTED NOT DETECTED Final   Candida glabrata NOT DETECTED NOT DETECTED Final   Candida krusei NOT DETECTED NOT DETECTED Final   Candida parapsilosis NOT DETECTED NOT DETECTED Final   Candida tropicalis NOT DETECTED NOT DETECTED Final    Comment: Performed at [facility]  MRSA PCR Screening     Status: None   Collection Time: 03/14/16  1:58 AM  Result Value Ref Range Status   MRSA by PCR NEGATIVE NEGATIVE Final    Comment:        The GeneXpert MRSA Assay (FDA approved for NASAL specimens only), is one component of a comprehensive MRSA colonization surveillance program. It is not intended to diagnose MRSA infection nor to guide or monitor treatment for MRSA infections.   Culture, blood (routine x 2)     Status: None   Collection  Time: 03/15/16  8:46 AM  Result Value Ref Range Status   Specimen Description BLOOD RIGHT HAND  Final   Special Requests BOTTLES DRAWN AEROBIC AND ANAEROBIC 5CC  Final   Culture   Final    NO GROWTH 5 DAYS Performed at [facility]    Report Status 03/20/2016 FINAL  Final  Culture, blood (routine x 2)     Status: None   Collection Time: 03/15/16  8:48 AM  Result Value Ref Range Status   Specimen Description BLOOD LEFT HAND  Final   Special Requests IN PEDIATRIC BOTTLE [facility]  Final   Culture   Final    NO GROWTH 5 DAYS Performed at [facility]    Report Status 03/20/2016 FINAL  Final     ASSESSMENT: She is improving on polymicrobial bacteremia with Escherichia coli, group F strep, Clostridium, and diphtheroids complicating her sacral wound infection and osteomyelitis. I will change her to once daily IV ceftriaxone and oral metronidazole. This will continue to provide excellent coverage for the known and suspected pathogens while reducing her risk for antibiotic associated diarrhea. I recommend 6 full weeks of therapy through 04/23/2016.  PLAN: 1. Change ertapenem to IV ceftriaxone and oral metronidazole and  treat through 04/23/2016 2. I will sign off now  Michel Bickers, MD [facility] for [place] [phone] pager   [phone] cell 03/20/2016, 4:22 PM

## 2016-03-20 NOTE — Progress Notes (Signed)
PT Cancellation Note / Screen  Patient Details Name: Sharon Warner MRN: [id number] DOB: [birth year]   Cancelled Treatment:    Reason Eval/Treat Not Completed: PT screened, no needs identified, will sign off Pt has written discharge summary and likely to d/c to [facility] later today.  Hydrotherapy order received however since pt is set to d/c, will defer hydrotherapy to [facility] as this would be most efficient management of wound at this time.   Timothee Gali,KATHrine E 03/20/2016, 4:03 PM Carmelia Bake, PT, DPT 03/20/2016 Pager: [phone]

## 2016-03-20 NOTE — Discharge Summary (Addendum)
Physician Discharge Summary  Sarath Talavera [id number] DOB: 10-18-1950 DOA: 03/13/2016  PCP: [name], MD  Admit date: 03/13/2016 Discharge date: 03/20/2016  Time spent: 45 minutes  DISCHARGED to LTAC for IV ABx, Aggressive wound care  Recommendations for Outpatient Follow-up:  1.  PCP Dr.Koirala in 1 week 2.  ID, Dr.Campbell in 5weeks 3.  Continue IV ertapenem till 7/25(5weeks) 4.  Please have IR place PICC in LTAC  5.  Has a chronic foley, last changed 6/15, needs changed Qmonthly 6.  Needs FU of Thyroid nodules and thoracic aortic aneurysm  Discharge Diagnoses:  Principal Problem:   Sacral osteomyelitis (HCC)   Polymicrobial bacteremia   Severe sepsis (HCC)   Morbid obesity (HCC)   Hypokalemia   Type 2 diabetes mellitus (HCC)   AKI (acute kidney injury) (HCC)   Lactic acidosis   Multiple thyroid nodules   Enlarged thoracic aorta (HCC)   HTN (hypertension)   Pressure ulcer   Paraplegia (HCC)   Neurogenic bladder   Infected decubitus ulcer   Obstructive sleep apnea   Normocytic anemia   Bacteremia   Proctitis   Diarrhea   Pyogenic inflammation of bone (HCC)   Decubital ulcer   Discharge Condition: improved  Diet recommendation: Diabetic, low sodium  Filed Weights   03/16/16 0500 03/17/16 0454 03/19/16 0531  Weight: 118.3 kg (260 lb 12.9 oz) 122.8 kg (270 lb 11.6 oz) 124 kg (273 lb 5.9 oz)    History of present illness:  Chief Complaint: Fever, fatigue, Worsening wound HPI: Sharon Warner is a 65 y.o. woman with a history of paraplegia x 4 years (wheelchair bound, chronic indwelling foley catheter), morbid obesity, HTN, DM (diet controlled), and vitamin D deficiency who just relocated to this area with her husband, apparently came straight to this ED via ambulance shortly after landing. She complained of increased fatigue and recurrent fevers. She also knew that she needed to have her sacral wound evaluated.  Hospital Course:  Stage IV infected sacral  decubitus ulcer with Sacral osteomyelitis / Severe sepsis -ID consulted- Pt was transitioned from IV vancomycin, Azactam and Flagyl to Invanz for Polymicrobial bacteremia involving Escherichia coli, Streptococcus group F and a gram variable rod bacteremia and wound infection/scaral osteomyelitis. -repeat Blood Cx are negative -PICC line and 6 weeks of IV antibiotics recommended by ID, already completed 1 week, needs this till 7/25 -PICC team was unable to get PICC- IR consulted-pending at this time -Seen by Wound care: recommended hydrotherapy at this time, which is to start today -plan for LTAC to receive aggressive wound care and IV antibiotics    Morbid obesity Body mass index is 51.68 kg/(m^2).   Hypokalemia Resolved  Type 2 diabetes mellitus  -Started on Lantus 16 units daily and insulin sliding scale-hemoglobin A1c 8.4-not on medication at home   AKI (acute kidney injury) -Creatinine 1.43 improved to 0.7  -Likely was due to sepsis and dehydration   Lactic acidosis -Resolved   Multiple thyroid nodules -Outpatient follow-up-TSH is normal-free T4 mildly elevated at 1.23   Enlarged thoracic aorta  -4.2 cm measured on CT scan -needs FU    HTN (hypertension) - resumed metoprolol   Paraplegia with Neurogenic bladder - Continue Foley catheter-last changed on 6/15 and needs change in 1 month   Candida dermatitis -Groin and upper thigh area-given nystatin and fluconazole inpatient   Obstructive sleep apnea - C Pap QHS    ? suspected Chronic diastolic CHF -takes lasix at home, this was resumed, no ECHO in our system  Consultations:  ID  Discharge Exam: Filed Vitals:   03/19/16 2032 03/20/16 0613  BP: 154/74 140/67  Pulse: 81 79  Temp: 98.3 F (36.8 C) 98.4 F (36.9 C)  Resp: 19 19    General: AAOx3 Cardiovascular: S1S2/RRR Respiratory: CTAB  Discharge Instructions   Discharge Instructions    Diet - low sodium heart healthy    Complete by:   As directed      Increase activity slowly    Complete by:  As directed           Current Discharge Medication List    START taking these medications   Details  ertapenem 1 g in sodium chloride 0.9 % 50 mL Inject 1 g into the vein daily. Till July 25 (5weeks)    feeding supplement, GLUCERNA SHAKE, (GLUCERNA SHAKE) LIQD Take 237 mLs by mouth 3 (three) times daily between meals. Refills: 0    insulin aspart (NOVOLOG) 100 UNIT/ML injection Inject 0-20 Units into the skin 3 (three) times daily with meals.    insulin glargine (LANTUS) 100 UNIT/ML injection Inject 0.16 mLs (16 Units total) into the skin daily.    saccharomyces boulardii (FLORASTOR) 250 MG capsule Take 1 capsule (250 mg total) by mouth 2 (two) times daily.      CONTINUE these medications which have CHANGED   Details  metoprolol succinate (TOPROL-XL) 100 MG 24 hr tablet Take 1 tablet (100 mg total) by mouth daily. Take with or immediately following a meal.      CONTINUE these medications which have NOT CHANGED   Details  aspirin EC 81 MG tablet Take 81 mg by mouth daily.    B Complex-C (B-COMPLEX WITH VITAMIN C) tablet Take 1 tablet by mouth daily.    CRANBERRY PO Take 1 capsule by mouth daily.    furosemide (LASIX) 40 MG tablet Take 40 mg by mouth daily.    potassium chloride (K-DUR) 10 MEQ tablet Take 10 mEq by mouth 2 (two) times daily.    Vitamin D, Ergocalciferol, (DRISDOL) 50000 units CAPS capsule Take 50,000 Units by mouth every 7 (seven) days.    zinc sulfate 220 (50 Zn) MG capsule Take 220 mg by mouth daily.       Allergies  Allergen Reactions  . Levaquin [Levofloxacin In D5w] Other (See Comments)    Unknown childhood allergy  . Penicillins Other (See Comments)    Unknown childhood allergy   Follow-up Information    Follow up with Michel Bickers, MD In 5 weeks.   Specialty:  Infectious Diseases   Contact information:   [address] [place] [address] [phone]         The results of significant diagnostics from this hospitalization (including imaging, microbiology, ancillary and laboratory) are listed below for reference.    Significant Diagnostic Studies: Dg Chest 2 View  03/13/2016  CLINICAL DATA:  Initial evaluation for acute shortness of breath. History of CHF, hypertension, diabetes. EXAM: CHEST  2 VIEW COMPARISON:  None. FINDINGS: Accentuation of the cardiac silhouette related AP technique and shallow lung inflation. Mediastinal silhouette within normal limits. Lungs are hypoinflated with elevation the right hemidiaphragm. Associated mild right basilar atelectasis. Lungs otherwise clear without focal infiltrate or pulmonary edema. No pleural effusion. No pneumothorax. No acute osseous abnormality. IMPRESSION: 1. Shallow lung inflation with elevation the right hemidiaphragm. Associated mild right basilar atelectasis. 2. No other active cardiopulmonary disease. Electronically Signed   By: Jeannine Boga M.D.   On: 03/13/2016 23:41  Dg Lumbar Spine 2-3 Views  03/13/2016  CLINICAL DATA:  Initial evaluation for decubitus ulcer. EXAM: LUMBAR SPINE - 2-3 VIEW COMPARISON:  None. FINDINGS: Five non rib-bearing lumbar type vertebral bodies. Vertebral body heights preserved. No acute fracture or subluxation. Moderate degenerative changes within the lower lumbar spine. Soft tissue ulceration posterior to the distal sacrum/coccyx with underlying osseous erosion. Large amount of retained stool within the rectal vault. Gaseous distension of the bowels proximally. Osteopenia. IMPRESSION: 1. Decubitus ulcer with underlying osseous erosion of the distal sacrum/coccyx, better evaluated on concomitant radiograph of the sacrum/coccyx. 2. No other acute abnormality about the lumbar spine. 3. Moderate to advanced degenerative spondylolysis within the lower lumbar spine. 4. Large amount of retained stool within the rectal vault with gaseous distension proximally.  Electronically Signed   By: Jeannine Boga M.D.   On: 03/13/2016 23:39   Dg Sacrum/coccyx  03/13/2016  CLINICAL DATA:  Initial evaluation for decubitus ulcer. EXAM: SACRUM AND COCCYX - 2+ VIEW COMPARISON:  None. FINDINGS: Soft tissue irregularity with emphysema present posterior to the sacrum/ coccyx, consistent with decubitus ulcer. Underlying distal sacrum/coccyx appears to be eroded. No dissecting soft tissue emphysema. No radiopaque foreign body. Remainder the visualized pelvis intact. Fixation hardware about the proximal femurs/hips. The left femoral proximal left femur appears subluxed superiorly relative to the left femoral neck. Osteopenia. Advanced degenerative changes within the visualized lower lumbar spine. IMPRESSION: 1. Soft tissue decubitus ulcer with underlying osseous erosion of the distal sacrum/coccyx. 2. Fixation hardware about the hips bilaterally. The proximal left femur appears subluxed superiorly relative to the left femoral neck. This is not well evaluated on this exam. Correlation with dedicated hip radiographs could be performed for further evaluation as clinically indicated. 3. Large amount of retained stool within the distal colon. 4. Osteopenia. Electronically Signed   By: Jeannine Boga M.D.   On: 03/13/2016 23:37   Ct Angio Chest Pe W/cm &/or Wo Cm  03/14/2016  CLINICAL DATA:  Low-grade fever for several days. EXAM: CT ANGIOGRAPHY CHEST WITH CONTRAST TECHNIQUE: Multidetector CT imaging of the chest was performed using the standard protocol during bolus administration of intravenous contrast. Multiplanar CT image reconstructions and MIPs were obtained to evaluate the vascular anatomy. CONTRAST:  80 mL Isovue 370 COMPARISON:  Chest radiography 03/13/2016 FINDINGS: Mediastinum/Lymph Nodes: Thyroid tissue is enlarged and heterogeneous. There are bilateral nodules, right-sided nodule measuring roughly 2.9 cm. Study has technical limitations due to breathing artifact.  Limited evaluation of the pulmonary arteries beyond the main pulmonary arteries. However, there is no evidence to suggest a large or central pulmonary embolism. Main pulmonary artery is large measuring up to 4.1 cm. The mid ascending thoracic aorta is also large for size measuring up to 4.2 cm. No evidence for an aortic dissection. Great vessels are patent. Descending thoracic aorta measures 3.3 cm. No evidence for chest lymphadenopathy. No significant pericardial fluid. Lungs/Pleura: Trachea and mainstem bronchi are patent. No pleural effusions. Volume loss in the right lower lobe. Patchy areas of ground-glass densities are suggestive for atelectasis. No large areas of consolidation or confluent airspace disease. Upper abdomen: Elevation of the right hemidiaphragm. Cannot exclude hepatic steatosis. Liver appears to be prominent for size. Entire liver is not visualized. Musculoskeletal: No acute bone abnormality. Review of the MIP images confirms the above findings. IMPRESSION: Limited evaluation for pulmonary emboli due to motion artifact and poor opacification of the pulmonary arteries. No evidence for a large central pulmonary embolism but poor characterization of the pulmonary arteries beyond the  main pulmonary arteries. Enlarged main pulmonary artery could represent underlying pulmonary hypertension. Enlargement of the ascending thoracic aorta measuring up to 4.2 cm. Recommend annual imaging followup by CTA or MRA. This recommendation follows 2010 ACCF/AHA/AATS/ACR/ASA/SCA/SCAI/SIR/STS/SVM Guidelines for the Diagnosis and Management of Patients with Thoracic Aortic Disease. Circulation. 2010; 121: [id number] Scattered ground-glass densities in lungs likely related to atelectasis. Probable hepatomegaly. Thyroid nodules. Recommend further characterization with thyroid ultrasound. Electronically Signed   By: Markus Daft M.D.   On: 03/14/2016 00:30   Mr Sacrum/si Joints Wo Contrast  03/15/2016  CLINICAL DATA:   Sacral decubitus ulcer. Question abscess or osteomyelitis. EXAM: MR SACRUM WITHOUT CONTRAST TECHNIQUE: Multiplanar, multisequence MR imaging was performed. No intravenous contrast was administered. COMPARISON:  Plain films of the sacrum 03/13/2016. FINDINGS: A large sacral decubitus ulcer is identified. Skin defect extends to bone and the coccyx appears exposed. Mild marrow edema is seen in the distal most sacrum and coccyx worrisome for osteomyelitis. No abscess is identified. Subcutaneous tissues about the patient's ulcer are edematous worrisome for cellulitis. Note is made the patient is status post fixation of bilateral hip fractures. Imaged intrapelvic contents demonstrate a thickened appearance of the walls of the rectosigmoid colon worrisome for proctitis. IMPRESSION: Large sacral decubitus ulcer extends to bone. Low level edema is seen in the distal sacrum and coccyx is compatible with osteomyelitis. Negative for abscess. Thickened appearance of the walls of the rectosigmoid colon worrisome for proctitis. Electronically Signed   By: Inge Rise M.D.   On: 03/15/2016 18:06    Microbiology: Recent Results (from the past 240 hour(s))  Wound or Superficial Culture     Status: None   Collection Time: 03/13/16 10:38 PM  Result Value Ref Range Status   Specimen Description DECUBITIS  Final   Special Requests Normal  Final   Gram Stain   Final    RARE WBC PRESENT, PREDOMINANTLY PMN MODERATE GRAM POSITIVE COCCI IN PAIRS FEW GRAM VARIABLE ROD    Culture   Final    FEW PROTEUS MIRABILIS NO STAPHYLOCOCCUS AUREUS ISOLATED NO GROUP A STREP (S.PYOGENES) ISOLATED Performed at [facility]    Report Status 03/17/2016 FINAL  Final   Organism ID, Bacteria PROTEUS MIRABILIS  Final      Susceptibility   Proteus mirabilis - MIC*    AMPICILLIN <=2 SENSITIVE Sensitive     CEFAZOLIN <=4 SENSITIVE Sensitive     CEFEPIME <=1 SENSITIVE Sensitive     CEFTAZIDIME <=1 SENSITIVE Sensitive      CEFTRIAXONE <=1 SENSITIVE Sensitive     CIPROFLOXACIN 1 SENSITIVE Sensitive     GENTAMICIN <=1 SENSITIVE Sensitive     IMIPENEM 4 SENSITIVE Sensitive     TRIMETH/SULFA <=20 SENSITIVE Sensitive     AMPICILLIN/SULBACTAM <=2 SENSITIVE Sensitive     PIP/TAZO <=4 SENSITIVE Sensitive     * FEW PROTEUS MIRABILIS  Urine culture     Status: Abnormal   Collection Time: 03/13/16 10:42 PM  Result Value Ref Range Status   Specimen Description URINE, CATHETERIZED  Final   Special Requests NONE  Final   Culture MULTIPLE SPECIES PRESENT, SUGGEST RECOLLECTION (A)  Final   Report Status 03/15/2016 FINAL  Final  Blood Culture (routine x 2)     Status: Abnormal   Collection Time: 03/13/16 10:49 PM  Result Value Ref Range Status   Specimen Description LEFT ANTECUBITAL  Final   Special Requests BOTTLES DRAWN AEROBIC AND ANAEROBIC 5CC  Final   Culture  Setup Time   Final  GRAM NEGATIVE RODS GRAM POSITIVE COCCI IN PAIRS ANAEROBIC BOTTLE ONLY CRITICAL RESULT CALLED TO, READ BACK BY AND VERIFIED WITH: BETH GREEN,PHARMD @0135  03/15/16 MKELLY GRAM NEGATIVE RODS AEROBIC BOTTLE ONLY Performed at [facility]    Culture (A)  Final    ESCHERICHIA COLI GRAM POSITIVE COCCI ORGANISM NOT VIABLE DIPHTHEROIDS(CORYNEBACTERIUM SPECIES)    Report Status 03/19/2016 FINAL  Final   Organism ID, Bacteria ESCHERICHIA COLI  Final      Susceptibility   Escherichia coli - MIC*    AMPICILLIN >=32 RESISTANT Resistant     CEFAZOLIN 8 SENSITIVE Sensitive     CEFEPIME <=1 SENSITIVE Sensitive     CEFTAZIDIME <=1 SENSITIVE Sensitive     CEFTRIAXONE <=1 SENSITIVE Sensitive     CIPROFLOXACIN <=0.25 SENSITIVE Sensitive     GENTAMICIN <=1 SENSITIVE Sensitive     IMIPENEM <=0.25 SENSITIVE Sensitive     TRIMETH/SULFA >=320 RESISTANT Resistant     AMPICILLIN/SULBACTAM >=32 RESISTANT Resistant     PIP/TAZO 16 SENSITIVE Sensitive     * ESCHERICHIA COLI  Blood Culture ID Panel (Reflexed)     Status: Abnormal   Collection  Time: 03/13/16 10:49 PM  Result Value Ref Range Status   Enterococcus species NOT DETECTED NOT DETECTED Final   Vancomycin resistance NOT DETECTED NOT DETECTED Final   Listeria monocytogenes NOT DETECTED NOT DETECTED Final   Staphylococcus species NOT DETECTED NOT DETECTED Final   Staphylococcus aureus NOT DETECTED NOT DETECTED Final   Methicillin resistance NOT DETECTED NOT DETECTED Final   Streptococcus species DETECTED (A) NOT DETECTED Final    Comment: CRITICAL RESULT CALLED TO, READ BACK BY AND VERIFIED WITH: BETH GREEN, PHARMD @0135  03/15/16 MKELLY    Streptococcus agalactiae NOT DETECTED NOT DETECTED Final   Streptococcus pneumoniae NOT DETECTED NOT DETECTED Final   Streptococcus pyogenes NOT DETECTED NOT DETECTED Final   Acinetobacter baumannii NOT DETECTED NOT DETECTED Final   Enterobacteriaceae species DETECTED (A) NOT DETECTED Final    Comment: CRITICAL RESULT CALLED TO, READ BACK BY AND VERIFIED WITH: BETH GREEN, PHARMD @0135  03/15/16 MKELLY    Enterobacter cloacae complex NOT DETECTED NOT DETECTED Final   Escherichia coli DETECTED (A) NOT DETECTED Final    Comment: CRITICAL RESULT CALLED TO, READ BACK BY AND VERIFIED WITH: BETH GREEN, PHARMD @0135  03/15/16 MKELLY    Klebsiella oxytoca NOT DETECTED NOT DETECTED Final   Klebsiella pneumoniae NOT DETECTED NOT DETECTED Final   Proteus species NOT DETECTED NOT DETECTED Final   Serratia marcescens NOT DETECTED NOT DETECTED Final   Carbapenem resistance NOT DETECTED NOT DETECTED Final   Haemophilus influenzae NOT DETECTED NOT DETECTED Final   Neisseria meningitidis NOT DETECTED NOT DETECTED Final   Pseudomonas aeruginosa NOT DETECTED NOT DETECTED Final   Candida albicans NOT DETECTED NOT DETECTED Final   Candida glabrata NOT DETECTED NOT DETECTED Final   Candida krusei NOT DETECTED NOT DETECTED Final   Candida parapsilosis NOT DETECTED NOT DETECTED Final   Candida tropicalis NOT DETECTED NOT DETECTED Final  Blood Culture  (routine x 2)     Status: Abnormal   Collection Time: 03/13/16 11:35 PM  Result Value Ref Range Status   Specimen Description BLOOD RIGHT FOREARM  Final   Special Requests BOTTLES DRAWN AEROBIC AND ANAEROBIC 5CC  Final   Culture  Setup Time   Final    GRAM VARIABLE ROD ONLY IN ANAEROBIC BOTTLE GRAM POSITIVE COCCI IN PAIRS IN CHAINS CRITICAL RESULT CALLED TO, READ BACK BY AND VERIFIED WITH: C SUMME 03/15/16 @  [place] AND ANAEROBIC BOTTLES Performed at [facility]  (A)  Final   Report Status 03/20/2016 FINAL  Final   Organism ID, Bacteria STREPTOCOCCUS GROUP F  Final      Susceptibility   Streptococcus group f - MIC*    PENICILLIN <=0.06 SENSITIVE Sensitive     CEFTRIAXONE <=0.12 SENSITIVE Sensitive     ERYTHROMYCIN <=0.12 SENSITIVE Sensitive     LEVOFLOXACIN <=0.25 SENSITIVE Sensitive     VANCOMYCIN 0.25 SENSITIVE Sensitive     * STREPTOCOCCUS GROUP F  Blood Culture ID Panel (Reflexed)     Status: Abnormal   Collection Time: 03/13/16 11:35 PM  Result Value Ref Range Status   Enterococcus species NOT DETECTED NOT DETECTED Final   Vancomycin resistance NOT DETECTED NOT DETECTED Final   Listeria monocytogenes NOT DETECTED NOT DETECTED Final   Staphylococcus species NOT DETECTED NOT DETECTED Final   Staphylococcus aureus NOT DETECTED NOT DETECTED Final   Methicillin resistance NOT DETECTED NOT DETECTED Final   Streptococcus species DETECTED (A) NOT DETECTED Final    Comment: CRITICAL RESULT CALLED TO, READ BACK BY AND VERIFIED WITH: C SUMME 03/15/16 @ 1204 M VESTAL    Streptococcus agalactiae NOT DETECTED NOT DETECTED Final   Streptococcus pneumoniae NOT DETECTED NOT DETECTED Final   Streptococcus pyogenes NOT DETECTED NOT DETECTED Final   Acinetobacter baumannii NOT DETECTED NOT DETECTED Final   Enterobacteriaceae species NOT DETECTED NOT DETECTED Final   Enterobacter cloacae complex NOT DETECTED  NOT DETECTED Final   Escherichia coli NOT DETECTED NOT DETECTED Final   Klebsiella oxytoca NOT DETECTED NOT DETECTED Final   Klebsiella pneumoniae NOT DETECTED NOT DETECTED Final   Proteus species NOT DETECTED NOT DETECTED Final   Serratia marcescens NOT DETECTED NOT DETECTED Final   Carbapenem resistance NOT DETECTED NOT DETECTED Final   Haemophilus influenzae NOT DETECTED NOT DETECTED Final   Neisseria meningitidis NOT DETECTED NOT DETECTED Final   Pseudomonas aeruginosa NOT DETECTED NOT DETECTED Final   Candida albicans NOT DETECTED NOT DETECTED Final   Candida glabrata NOT DETECTED NOT DETECTED Final   Candida krusei NOT DETECTED NOT DETECTED Final   Candida parapsilosis NOT DETECTED NOT DETECTED Final   Candida tropicalis NOT DETECTED NOT DETECTED Final    Comment: Performed at [facility]  MRSA PCR Screening     Status: None   Collection Time: 03/14/16  1:58 AM  Result Value Ref Range Status   MRSA by PCR NEGATIVE NEGATIVE Final    Comment:        The GeneXpert MRSA Assay (FDA approved for NASAL specimens only), is one component of a comprehensive MRSA colonization surveillance program. It is not intended to diagnose MRSA infection nor to guide or monitor treatment for MRSA infections.   Culture, blood (routine x 2)     Status: None (Preliminary result)   Collection Time: 03/15/16  8:46 AM  Result Value Ref Range Status   Specimen Description BLOOD RIGHT HAND  Final   Special Requests BOTTLES DRAWN AEROBIC AND ANAEROBIC 5CC  Final   Culture   Final    NO GROWTH 4 DAYS Performed at [facility]    Report Status PENDING  Incomplete  Culture, blood (routine x 2)     Status: None (Preliminary result)   Collection Time: 03/15/16  8:48 AM  Result Value Ref Range Status   Specimen Description BLOOD LEFT HAND  Final  Special Requests IN PEDIATRIC BOTTLE 2CC  Final   Culture   Final    NO GROWTH 4 DAYS Performed at [facility]    Report Status  PENDING  Incomplete     Labs: Basic Metabolic Panel:  Recent Labs Lab 03/14/16 0512 03/14/16 1353 03/15/16 0309 03/16/16 0309 03/17/16 0523 03/20/16 0532  NA 129*  --  135 137 137 139  K 2.9* 3.9 3.8 3.3* 4.0 3.4*  CL 97*  --  107 107 109 110  CO2 20*  --  19* 23 20* 24  GLUCOSE 324*  --  233* 217* 169* 146*  BUN 23*  --  23* 21* 19 13  CREATININE 1.34*  --  1.00 0.80 0.70 0.65  CALCIUM 9.4  --  9.3 9.6 10.0 9.9  MG  --   --  1.8 1.7  --   --    Liver Function Tests:  Recent Labs Lab 03/13/16 2247 03/15/16 0309  AST 39 21  ALT 21 16  ALKPHOS 88 51  BILITOT 1.1 0.5  PROT 7.5 6.1*  ALBUMIN 3.2* 2.5*   No results for input(s): LIPASE, AMYLASE in the last 168 hours. No results for input(s): AMMONIA in the last 168 hours. CBC:  Recent Labs Lab 03/13/16 2247  03/14/16 0512 03/15/16 0309 03/16/16 0309 03/17/16 0523 03/20/16 0532  WBC 23.5*  < > 33.0* 15.1* 11.6* 12.5* 13.5*  NEUTROABS 21.1*  --   --  11.3* 7.2  --   --   HGB 13.3  < > 11.5* 11.0* 10.4* 10.5* 10.9*  HCT 40.6  < > 35.5* 33.2* 32.6* 33.2* 34.4*  MCV 82.9  < > 83.3 81.2 84.2 84.7 85.6  PLT 452*  < > 341 318 297 316 320  < > = values in this interval not displayed. Cardiac Enzymes:  Recent Labs Lab 03/13/16 2247 03/15/16 0309  CKTOTAL  --  113  TROPONINI 0.03  --    BNP: BNP (last 3 results)  Recent Labs  03/13/16 2248  BNP 48.6    ProBNP (last 3 results) No results for input(s): PROBNP in the last 8760 hours.  CBG:  Recent Labs Lab 03/19/16 1229 03/19/16 1707 03/19/16 2109 03/20/16 0749 03/20/16 1239  GLUCAP 140* 129* 127* 122* 184*       SignedDomenic Polite MD.  [facility] 03/20/2016, 12:43 PM

## 2016-03-21 ENCOUNTER — Inpatient Hospital Stay (HOSPITAL_COMMUNITY): Payer: BLUE CROSS/BLUE SHIELD

## 2016-03-21 ENCOUNTER — Inpatient Hospital Stay
Admission: AD | Admit: 2016-03-21 | Discharge: 2016-04-11 | Disposition: A | Discharge status: Skilled Nursing Facility | Payer: Self-pay | Source: Ambulatory Visit | Attending: Internal Medicine | Admitting: Internal Medicine

## 2016-03-21 DIAGNOSIS — N179 Acute kidney failure, unspecified: Secondary | ICD-10-CM

## 2016-03-21 DIAGNOSIS — J9690 Respiratory failure, unspecified, unspecified whether with hypoxia or hypercapnia: Secondary | ICD-10-CM

## 2016-03-21 DIAGNOSIS — R197 Diarrhea, unspecified: Secondary | ICD-10-CM

## 2016-03-21 LAB — CREATININE, SERUM
CREATININE: 0.74 mg/dL (ref 0.44–1.00)
GFR calc Af Amer: >60 mL/min (ref >60)
GFR calc non Af Amer: >60 mL/min (ref >60)

## 2016-03-21 LAB — GLUCOSE, CAPILLARY
GLUCOSE-CAPILLARY: 162 mg/dL — AB (ref 65–99)
Glucose-Capillary: 155 mg/dL — ABNORMAL HIGH (ref 65–99)

## 2016-03-21 MED ORDER — LIDOCAINE HCL 1 % IJ SOLN
INTRAMUSCULAR | Status: AC
  Filled 2016-03-21: qty 20

## 2016-03-21 MED ORDER — METRONIDAZOLE 500 MG PO TABS: 500.0000 mg | ORAL_TABLET | Freq: Four times a day (QID) | ORAL | Status: DC

## 2016-03-21 MED ORDER — ENOXAPARIN SODIUM 60 MG/0.6ML ~~LOC~~ SOLN
0.5000 mg/kg | SUBCUTANEOUS | Status: DC
  Filled 2016-03-21: qty 0.6

## 2016-03-21 MED ORDER — LIDOCAINE HCL 1 % IJ SOLN
INTRAMUSCULAR | Status: DC | PRN
  Administered 2016-03-21: 5 mL

## 2016-03-21 MED ORDER — DEXTROSE 5 % IV SOLN: 2.0000 g | INTRAVENOUS | Status: DC

## 2016-03-21 NOTE — Progress Notes (Signed)
Report called to [employer] laboratory technician. [facility] called for transport. VSS. Pt belongings transported w/ pt. Kizzie Ide, RN

## 2016-03-21 NOTE — Discharge Summary (Signed)
Physician Discharge Summary  Sharon Warner [id number] DOB: 1951-09-02 DOA: 03/13/2016  PCP: [name], MD  Admit date: 03/13/2016 Discharge date: 03/21/2016  Time spent: 45 minutes  DISCHARGED to LTAC for IV ABx, Aggressive wound care  Recommendations for Outpatient Follow-up:  1.  PCP Dr.Koirala in 1 week 2.  ID, Dr.Campbell in 5weeks 3.  Continue IV rocephin till 7/25(5weeks) along with flagyl 4.  Has a chronic foley, last changed 6/15, needs changed Qmonthly 5.  Needs FU of Thyroid nodules and thoracic aortic aneurysm  Discharge Diagnoses:  Principal Problem:   Sacral osteomyelitis (HCC)   Polymicrobial bacteremia   Severe sepsis (HCC)   Morbid obesity (HCC)   Hypokalemia   Type 2 diabetes mellitus (HCC)   AKI (acute kidney injury) (HCC)   Lactic acidosis   Multiple thyroid nodules   Enlarged thoracic aorta (HCC)   HTN (hypertension)   Pressure ulcer   Paraplegia (HCC)   Neurogenic bladder   Infected decubitus ulcer   Obstructive sleep apnea   Normocytic anemia   Bacteremia   Proctitis   Diarrhea   Pyogenic inflammation of bone (HCC)   Decubital ulcer   Discharge Condition: improved  Diet recommendation: Diabetic, low sodium  Filed Weights   03/16/16 0500 03/17/16 0454 03/19/16 0531  Weight: 118.3 kg (260 lb 12.9 oz) 122.8 kg (270 lb 11.6 oz) 124 kg (273 lb 5.9 oz)    History of present illness:  Chief Complaint: Fever, fatigue, Worsening wound HPI: Sharon Warner is a 65 y.o. woman with a history of paraplegia x 4 years (wheelchair bound, chronic indwelling foley catheter), morbid obesity, HTN, DM (diet controlled), and vitamin D deficiency who just relocated to this area with her husband, apparently came straight to this ED via ambulance shortly after landing. She complained of increased fatigue and recurrent fevers. She also knew that she needed to have her sacral wound evaluated.  Hospital Course:  Stage IV infected sacral decubitus ulcer with  Sacral osteomyelitis / Severe sepsis -ID consulted- Pt was transitioned from IV vancomycin, Azactam and Flagyl to Invanz for Polymicrobial bacteremia involving Escherichia coli, Streptococcus group F and a gram variable rod bacteremia and wound infection/scaral osteomyelitis-- now on rocephin -repeat Blood Cx are negative -PICC line and 6 weeks of IV antibiotics recommended by ID, already completed 1 week, needs this till 7/25 -PICC team was unable to get PICC- IR consulted-pending at this time -Seen by Wound care: recommended hydrotherapy at this time, which is to start today -plan for LTAC to receive aggressive wound care and IV antibiotics    Morbid obesity Body mass index is 51.68 kg/(m^2).   Hypokalemia Resolved  Type 2 diabetes mellitus  -Started on Lantus 16 units daily and insulin sliding scale-hemoglobin A1c 8.4-not on medication at home   AKI (acute kidney injury) -Creatinine 1.43 improved to 0.7  -Likely was due to sepsis and dehydration   Lactic acidosis -Resolved   Multiple thyroid nodules -Outpatient follow-up-TSH is normal-free T4 mildly elevated at 1.23   Enlarged thoracic aorta  -4.2 cm measured on CT scan -needs FU    HTN (hypertension) - resumed metoprolol   Paraplegia with Neurogenic bladder - Continue Foley catheter-last changed on 6/15 and needs change in 1 month   Candida dermatitis -Groin and upper thigh area-given nystatin and fluconazole inpatient   Obstructive sleep apnea - C Pap QHS    ? suspected Chronic diastolic CHF -takes lasix at home, this was resumed, no ECHO in our system  Consultations:  ID  Discharge Exam: Filed Vitals:   03/20/16 2222 03/21/16 0510  BP: 133/66 152/73  Pulse: 86 77  Temp: 98.6 F (37 C) 98.3 F (36.8 C)  Resp: 20 20    General: AAOx3 Cardiovascular: S1S2/RRR Respiratory: CTAB  Discharge Instructions   Discharge Instructions    Diet - low sodium heart healthy    Complete by:  As  directed      Increase activity slowly    Complete by:  As directed           Current Discharge Medication List    START taking these medications   Details  cefTRIAXone 2 g in dextrose 5 % 50 mL Inject 2 g into the vein daily.    feeding supplement, GLUCERNA SHAKE, (GLUCERNA SHAKE) LIQD Take 237 mLs by mouth 3 (three) times daily between meals. Refills: 0    insulin aspart (NOVOLOG) 100 UNIT/ML injection Inject 0-20 Units into the skin 3 (three) times daily with meals.    insulin glargine (LANTUS) 100 UNIT/ML injection Inject 0.16 mLs (16 Units total) into the skin daily.    metroNIDAZOLE (FLAGYL) 500 MG tablet Take 1 tablet (500 mg total) by mouth every 6 (six) hours.    saccharomyces boulardii (FLORASTOR) 250 MG capsule Take 1 capsule (250 mg total) by mouth 2 (two) times daily.      CONTINUE these medications which have CHANGED   Details  metoprolol succinate (TOPROL-XL) 100 MG 24 hr tablet Take 1 tablet (100 mg total) by mouth daily. Take with or immediately following a meal.      CONTINUE these medications which have NOT CHANGED   Details  aspirin EC 81 MG tablet Take 81 mg by mouth daily.    B Complex-C (B-COMPLEX WITH VITAMIN C) tablet Take 1 tablet by mouth daily.    CRANBERRY PO Take 1 capsule by mouth daily.    furosemide (LASIX) 40 MG tablet Take 40 mg by mouth daily.    potassium chloride (K-DUR) 10 MEQ tablet Take 10 mEq by mouth 2 (two) times daily.    Vitamin D, Ergocalciferol, (DRISDOL) 50000 units CAPS capsule Take 50,000 Units by mouth every 7 (seven) days.    zinc sulfate 220 (50 Zn) MG capsule Take 220 mg by mouth daily.       Allergies  Allergen Reactions  . Levaquin [Levofloxacin In D5w] Other (See Comments)    Unknown childhood allergy  . Penicillins Other (See Comments)    Unknown childhood allergy   Follow-up Information    Follow up with Michel Bickers, MD In 5 weeks.   Specialty:  Infectious Diseases   Contact information:   [address] [place] [address] [phone]        The results of significant diagnostics from this hospitalization (including imaging, microbiology, ancillary and laboratory) are listed below for reference.    Significant Diagnostic Studies: Dg Chest 2 View  03/13/2016  CLINICAL DATA:  Initial evaluation for acute shortness of breath. History of CHF, hypertension, diabetes. EXAM: CHEST  2 VIEW COMPARISON:  None. FINDINGS: Accentuation of the cardiac silhouette related AP technique and shallow lung inflation. Mediastinal silhouette within normal limits. Lungs are hypoinflated with elevation the right hemidiaphragm. Associated mild right basilar atelectasis. Lungs otherwise clear without focal infiltrate or pulmonary edema. No pleural effusion. No pneumothorax. No acute osseous abnormality. IMPRESSION: 1. Shallow lung inflation with elevation the right hemidiaphragm. Associated mild right basilar atelectasis. 2. No other active cardiopulmonary disease. Electronically Signed  By: Jeannine Boga M.D.   On: 03/13/2016 23:41   Dg Lumbar Spine 2-3 Views  03/13/2016  CLINICAL DATA:  Initial evaluation for decubitus ulcer. EXAM: LUMBAR SPINE - 2-3 VIEW COMPARISON:  None. FINDINGS: Five non rib-bearing lumbar type vertebral bodies. Vertebral body heights preserved. No acute fracture or subluxation. Moderate degenerative changes within the lower lumbar spine. Soft tissue ulceration posterior to the distal sacrum/coccyx with underlying osseous erosion. Large amount of retained stool within the rectal vault. Gaseous distension of the bowels proximally. Osteopenia. IMPRESSION: 1. Decubitus ulcer with underlying osseous erosion of the distal sacrum/coccyx, better evaluated on concomitant radiograph of the sacrum/coccyx. 2. No other acute abnormality about the lumbar spine. 3. Moderate to advanced degenerative spondylolysis within the lower lumbar spine. 4. Large amount of retained stool within  the rectal vault with gaseous distension proximally. Electronically Signed   By: Jeannine Boga M.D.   On: 03/13/2016 23:39   Dg Sacrum/coccyx  03/13/2016  CLINICAL DATA:  Initial evaluation for decubitus ulcer. EXAM: SACRUM AND COCCYX - 2+ VIEW COMPARISON:  None. FINDINGS: Soft tissue irregularity with emphysema present posterior to the sacrum/ coccyx, consistent with decubitus ulcer. Underlying distal sacrum/coccyx appears to be eroded. No dissecting soft tissue emphysema. No radiopaque foreign body. Remainder the visualized pelvis intact. Fixation hardware about the proximal femurs/hips. The left femoral proximal left femur appears subluxed superiorly relative to the left femoral neck. Osteopenia. Advanced degenerative changes within the visualized lower lumbar spine. IMPRESSION: 1. Soft tissue decubitus ulcer with underlying osseous erosion of the distal sacrum/coccyx. 2. Fixation hardware about the hips bilaterally. The proximal left femur appears subluxed superiorly relative to the left femoral neck. This is not well evaluated on this exam. Correlation with dedicated hip radiographs could be performed for further evaluation as clinically indicated. 3. Large amount of retained stool within the distal colon. 4. Osteopenia. Electronically Signed   By: Jeannine Boga M.D.   On: 03/13/2016 23:37   Ct Angio Chest Pe W/cm &/or Wo Cm  03/14/2016  CLINICAL DATA:  Low-grade fever for several days. EXAM: CT ANGIOGRAPHY CHEST WITH CONTRAST TECHNIQUE: Multidetector CT imaging of the chest was performed using the standard protocol during bolus administration of intravenous contrast. Multiplanar CT image reconstructions and MIPs were obtained to evaluate the vascular anatomy. CONTRAST:  80 mL Isovue 370 COMPARISON:  Chest radiography 03/13/2016 FINDINGS: Mediastinum/Lymph Nodes: Thyroid tissue is enlarged and heterogeneous. There are bilateral nodules, right-sided nodule measuring roughly 2.9 cm. Study has  technical limitations due to breathing artifact. Limited evaluation of the pulmonary arteries beyond the main pulmonary arteries. However, there is no evidence to suggest a large or central pulmonary embolism. Main pulmonary artery is large measuring up to 4.1 cm. The mid ascending thoracic aorta is also large for size measuring up to 4.2 cm. No evidence for an aortic dissection. Great vessels are patent. Descending thoracic aorta measures 3.3 cm. No evidence for chest lymphadenopathy. No significant pericardial fluid. Lungs/Pleura: Trachea and mainstem bronchi are patent. No pleural effusions. Volume loss in the right lower lobe. Patchy areas of ground-glass densities are suggestive for atelectasis. No large areas of consolidation or confluent airspace disease. Upper abdomen: Elevation of the right hemidiaphragm. Cannot exclude hepatic steatosis. Liver appears to be prominent for size. Entire liver is not visualized. Musculoskeletal: No acute bone abnormality. Review of the MIP images confirms the above findings. IMPRESSION: Limited evaluation for pulmonary emboli due to motion artifact and poor opacification of the pulmonary arteries. No evidence for a large  central pulmonary embolism but poor characterization of the pulmonary arteries beyond the main pulmonary arteries. Enlarged main pulmonary artery could represent underlying pulmonary hypertension. Enlargement of the ascending thoracic aorta measuring up to 4.2 cm. Recommend annual imaging followup by CTA or MRA. This recommendation follows 2010 ACCF/AHA/AATS/ACR/ASA/SCA/SCAI/SIR/STS/SVM Guidelines for the Diagnosis and Management of Patients with Thoracic Aortic Disease. Circulation. 2010; 121: [id number] Scattered ground-glass densities in lungs likely related to atelectasis. Probable hepatomegaly. Thyroid nodules. Recommend further characterization with thyroid ultrasound. Electronically Signed   By: Markus Daft M.D.   On: 03/14/2016 00:30   Mr Sacrum/si  Joints Wo Contrast  03/15/2016  CLINICAL DATA:  Sacral decubitus ulcer. Question abscess or osteomyelitis. EXAM: MR SACRUM WITHOUT CONTRAST TECHNIQUE: Multiplanar, multisequence MR imaging was performed. No intravenous contrast was administered. COMPARISON:  Plain films of the sacrum 03/13/2016. FINDINGS: A large sacral decubitus ulcer is identified. Skin defect extends to bone and the coccyx appears exposed. Mild marrow edema is seen in the distal most sacrum and coccyx worrisome for osteomyelitis. No abscess is identified. Subcutaneous tissues about the patient's ulcer are edematous worrisome for cellulitis. Note is made the patient is status post fixation of bilateral hip fractures. Imaged intrapelvic contents demonstrate a thickened appearance of the walls of the rectosigmoid colon worrisome for proctitis. IMPRESSION: Large sacral decubitus ulcer extends to bone. Low level edema is seen in the distal sacrum and coccyx is compatible with osteomyelitis. Negative for abscess. Thickened appearance of the walls of the rectosigmoid colon worrisome for proctitis. Electronically Signed   By: Inge Rise M.D.   On: 03/15/2016 18:06    Microbiology: Recent Results (from the past 240 hour(s))  Wound or Superficial Culture     Status: None   Collection Time: 03/13/16 10:38 PM  Result Value Ref Range Status   Specimen Description DECUBITIS  Final   Special Requests Normal  Final   Gram Stain   Final    RARE WBC PRESENT, PREDOMINANTLY PMN MODERATE GRAM POSITIVE COCCI IN PAIRS FEW GRAM VARIABLE ROD    Culture   Final    FEW PROTEUS MIRABILIS NO STAPHYLOCOCCUS AUREUS ISOLATED NO GROUP A STREP (S.PYOGENES) ISOLATED Performed at [facility]    Report Status 03/17/2016 FINAL  Final   Organism ID, Bacteria PROTEUS MIRABILIS  Final      Susceptibility   Proteus mirabilis - MIC*    AMPICILLIN <=2 SENSITIVE Sensitive     CEFAZOLIN <=4 SENSITIVE Sensitive     CEFEPIME <=1 SENSITIVE Sensitive      CEFTAZIDIME <=1 SENSITIVE Sensitive     CEFTRIAXONE <=1 SENSITIVE Sensitive     CIPROFLOXACIN 1 SENSITIVE Sensitive     GENTAMICIN <=1 SENSITIVE Sensitive     IMIPENEM 4 SENSITIVE Sensitive     TRIMETH/SULFA <=20 SENSITIVE Sensitive     AMPICILLIN/SULBACTAM <=2 SENSITIVE Sensitive     PIP/TAZO <=4 SENSITIVE Sensitive     * FEW PROTEUS MIRABILIS  Urine culture     Status: Abnormal   Collection Time: 03/13/16 10:42 PM  Result Value Ref Range Status   Specimen Description URINE, CATHETERIZED  Final   Special Requests NONE  Final   Culture MULTIPLE SPECIES PRESENT, SUGGEST RECOLLECTION (A)  Final   Report Status 03/15/2016 FINAL  Final  Blood Culture (routine x 2)     Status: Abnormal   Collection Time: 03/13/16 10:49 PM  Result Value Ref Range Status   Specimen Description LEFT ANTECUBITAL  Final   Special Requests BOTTLES DRAWN AEROBIC AND ANAEROBIC 5CC  Final   Culture  Setup Time   Final    GRAM NEGATIVE RODS GRAM POSITIVE COCCI IN PAIRS ANAEROBIC BOTTLE ONLY CRITICAL RESULT CALLED TO, READ BACK BY AND VERIFIED WITH: BETH GREEN,PHARMD @0135  03/15/16 MKELLY GRAM NEGATIVE RODS AEROBIC BOTTLE ONLY Performed at [facility]    Culture (A)  Final    ESCHERICHIA COLI GRAM POSITIVE COCCI ORGANISM NOT VIABLE DIPHTHEROIDS(CORYNEBACTERIUM SPECIES)    Report Status 03/19/2016 FINAL  Final   Organism ID, Bacteria ESCHERICHIA COLI  Final      Susceptibility   Escherichia coli - MIC*    AMPICILLIN >=32 RESISTANT Resistant     CEFAZOLIN 8 SENSITIVE Sensitive     CEFEPIME <=1 SENSITIVE Sensitive     CEFTAZIDIME <=1 SENSITIVE Sensitive     CEFTRIAXONE <=1 SENSITIVE Sensitive     CIPROFLOXACIN <=0.25 SENSITIVE Sensitive     GENTAMICIN <=1 SENSITIVE Sensitive     IMIPENEM <=0.25 SENSITIVE Sensitive     TRIMETH/SULFA >=320 RESISTANT Resistant     AMPICILLIN/SULBACTAM >=32 RESISTANT Resistant     PIP/TAZO 16 SENSITIVE Sensitive     * ESCHERICHIA COLI  Blood Culture ID Panel  (Reflexed)     Status: Abnormal   Collection Time: 03/13/16 10:49 PM  Result Value Ref Range Status   Enterococcus species NOT DETECTED NOT DETECTED Final   Vancomycin resistance NOT DETECTED NOT DETECTED Final   Listeria monocytogenes NOT DETECTED NOT DETECTED Final   Staphylococcus species NOT DETECTED NOT DETECTED Final   Staphylococcus aureus NOT DETECTED NOT DETECTED Final   Methicillin resistance NOT DETECTED NOT DETECTED Final   Streptococcus species DETECTED (A) NOT DETECTED Final    Comment: CRITICAL RESULT CALLED TO, READ BACK BY AND VERIFIED WITH: BETH GREEN, PHARMD @0135  03/15/16 MKELLY    Streptococcus agalactiae NOT DETECTED NOT DETECTED Final   Streptococcus pneumoniae NOT DETECTED NOT DETECTED Final   Streptococcus pyogenes NOT DETECTED NOT DETECTED Final   Acinetobacter baumannii NOT DETECTED NOT DETECTED Final   Enterobacteriaceae species DETECTED (A) NOT DETECTED Final    Comment: CRITICAL RESULT CALLED TO, READ BACK BY AND VERIFIED WITH: BETH GREEN, PHARMD @0135  03/15/16 MKELLY    Enterobacter cloacae complex NOT DETECTED NOT DETECTED Final   Escherichia coli DETECTED (A) NOT DETECTED Final    Comment: CRITICAL RESULT CALLED TO, READ BACK BY AND VERIFIED WITH: BETH GREEN, PHARMD @0135  03/15/16 MKELLY    Klebsiella oxytoca NOT DETECTED NOT DETECTED Final   Klebsiella pneumoniae NOT DETECTED NOT DETECTED Final   Proteus species NOT DETECTED NOT DETECTED Final   Serratia marcescens NOT DETECTED NOT DETECTED Final   Carbapenem resistance NOT DETECTED NOT DETECTED Final   Haemophilus influenzae NOT DETECTED NOT DETECTED Final   Neisseria meningitidis NOT DETECTED NOT DETECTED Final   Pseudomonas aeruginosa NOT DETECTED NOT DETECTED Final   Candida albicans NOT DETECTED NOT DETECTED Final   Candida glabrata NOT DETECTED NOT DETECTED Final   Candida krusei NOT DETECTED NOT DETECTED Final   Candida parapsilosis NOT DETECTED NOT DETECTED Final   Candida tropicalis NOT  DETECTED NOT DETECTED Final  Blood Culture (routine x 2)     Status: Abnormal   Collection Time: 03/13/16 11:35 PM  Result Value Ref Range Status   Specimen Description BLOOD RIGHT FOREARM  Final   Special Requests BOTTLES DRAWN AEROBIC AND ANAEROBIC 5CC  Final   Culture  Setup Time   Final    GRAM VARIABLE ROD ONLY IN ANAEROBIC BOTTLE GRAM POSITIVE COCCI IN PAIRS IN CHAINS  CRITICAL RESULT CALLED TO, READ BACK BY AND VERIFIED WITH: C SUMME 03/15/16 @ 1204 M VESTAL IN BOTH AEROBIC AND ANAEROBIC BOTTLES Performed at [place]  (A)  Final   Report Status 03/20/2016 FINAL  Final   Organism ID, Bacteria STREPTOCOCCUS GROUP F  Final      Susceptibility   Streptococcus group f - MIC*    PENICILLIN <=0.06 SENSITIVE Sensitive     CEFTRIAXONE <=0.12 SENSITIVE Sensitive     ERYTHROMYCIN <=0.12 SENSITIVE Sensitive     LEVOFLOXACIN <=0.25 SENSITIVE Sensitive     VANCOMYCIN 0.25 SENSITIVE Sensitive     * STREPTOCOCCUS GROUP F  Blood Culture ID Panel (Reflexed)     Status: Abnormal   Collection Time: 03/13/16 11:35 PM  Result Value Ref Range Status   Enterococcus species NOT DETECTED NOT DETECTED Final   Vancomycin resistance NOT DETECTED NOT DETECTED Final   Listeria monocytogenes NOT DETECTED NOT DETECTED Final   Staphylococcus species NOT DETECTED NOT DETECTED Final   Staphylococcus aureus NOT DETECTED NOT DETECTED Final   Methicillin resistance NOT DETECTED NOT DETECTED Final   Streptococcus species DETECTED (A) NOT DETECTED Final    Comment: CRITICAL RESULT CALLED TO, READ BACK BY AND VERIFIED WITH: C SUMME 03/15/16 @ 1204 M VESTAL    Streptococcus agalactiae NOT DETECTED NOT DETECTED Final   Streptococcus pneumoniae NOT DETECTED NOT DETECTED Final   Streptococcus pyogenes NOT DETECTED NOT DETECTED Final   Acinetobacter baumannii NOT DETECTED NOT DETECTED Final   Enterobacteriaceae species NOT DETECTED NOT DETECTED Final    Enterobacter cloacae complex NOT DETECTED NOT DETECTED Final   Escherichia coli NOT DETECTED NOT DETECTED Final   Klebsiella oxytoca NOT DETECTED NOT DETECTED Final   Klebsiella pneumoniae NOT DETECTED NOT DETECTED Final   Proteus species NOT DETECTED NOT DETECTED Final   Serratia marcescens NOT DETECTED NOT DETECTED Final   Carbapenem resistance NOT DETECTED NOT DETECTED Final   Haemophilus influenzae NOT DETECTED NOT DETECTED Final   Neisseria meningitidis NOT DETECTED NOT DETECTED Final   Pseudomonas aeruginosa NOT DETECTED NOT DETECTED Final   Candida albicans NOT DETECTED NOT DETECTED Final   Candida glabrata NOT DETECTED NOT DETECTED Final   Candida krusei NOT DETECTED NOT DETECTED Final   Candida parapsilosis NOT DETECTED NOT DETECTED Final   Candida tropicalis NOT DETECTED NOT DETECTED Final    Comment: Performed at [facility]  MRSA PCR Screening     Status: None   Collection Time: 03/14/16  1:58 AM  Result Value Ref Range Status   MRSA by PCR NEGATIVE NEGATIVE Final    Comment:        The GeneXpert MRSA Assay (FDA approved for NASAL specimens only), is one component of a comprehensive MRSA colonization surveillance program. It is not intended to diagnose MRSA infection nor to guide or monitor treatment for MRSA infections.   Culture, blood (routine x 2)     Status: None   Collection Time: 03/15/16  8:46 AM  Result Value Ref Range Status   Specimen Description BLOOD RIGHT HAND  Final   Special Requests BOTTLES DRAWN AEROBIC AND ANAEROBIC 5CC  Final   Culture   Final    NO GROWTH 5 DAYS Performed at [facility]    Report Status 03/20/2016 FINAL  Final  Culture, blood (routine x 2)     Status: None   Collection Time: 03/15/16  8:48 AM  Result Value Ref Range  Status   Specimen Description BLOOD LEFT HAND  Final   Special Requests IN PEDIATRIC BOTTLE 2CC  Final   Culture   Final    NO GROWTH 5 DAYS Performed at [facility]    Report  Status 03/20/2016 FINAL  Final     Labs: Basic Metabolic Panel:  Recent Labs Lab 03/14/16 1353 03/15/16 0309 03/16/16 0309 03/17/16 0523 03/20/16 0532 03/21/16 0438  NA  --  135 137 137 139  --   K 3.9 3.8 3.3* 4.0 3.4*  --   CL  --  107 107 109 110  --   CO2  --  19* 23 20* 24  --   GLUCOSE  --  233* 217* 169* 146*  --   BUN  --  23* 21* 19 13  --   CREATININE  --  1.00 0.80 0.70 0.65 0.74  CALCIUM  --  9.3 9.6 10.0 9.9  --   MG  --  1.8 1.7  --   --   --    Liver Function Tests:  Recent Labs Lab 03/15/16 0309  AST 21  ALT 16  ALKPHOS 51  BILITOT 0.5  PROT 6.1*  ALBUMIN 2.5*   No results for input(s): LIPASE, AMYLASE in the last 168 hours. No results for input(s): AMMONIA in the last 168 hours. CBC:  Recent Labs Lab 03/15/16 0309 03/16/16 0309 03/17/16 0523 03/20/16 0532  WBC 15.1* 11.6* 12.5* 13.5*  NEUTROABS 11.3* 7.2  --   --   HGB 11.0* 10.4* 10.5* 10.9*  HCT 33.2* 32.6* 33.2* 34.4*  MCV 81.2 84.2 84.7 85.6  PLT 318 297 316 320   Cardiac Enzymes:  Recent Labs Lab 03/15/16 0309  CKTOTAL 113   BNP: BNP (last 3 results)  Recent Labs  03/13/16 2248  BNP 48.6    ProBNP (last 3 results) No results for input(s): PROBNP in the last 8760 hours.  CBG:  Recent Labs Lab 03/20/16 0749 03/20/16 1239 03/20/16 1812 03/20/16 2208 03/21/16 0807  GLUCAP 122* 184* 163* 141* 155*       Signed:  Mariabelen Pressly U Miyana Mordecai DO.  [facility] 03/21/2016, 9:53 AM

## 2016-03-21 NOTE — Progress Notes (Signed)
MEDICATION RELATED CONSULT NOTE   IR Procedure Consult - Anticoagulant/Antiplatelet PTA/Inpatient Med List Review by Pharmacist    Procedure:   Right double-lumen basilic vein PICC placement Completed: 6/22  Post-Procedural bleeding risk per IR MD assessment:  low  Antithrombotic medications on inpatient or PTA profile prior to procedure:    Enoxaparin 60 mg q24h    Recommended restart time per IR Post-Procedure Guidelines:   4 hours after completion  Other considerations:      Plan:     Enoxaparin 60 mg sq q24h move to 4 hours after procedure.  Royetta Asal, PharmD, BCPS Pager [phone] 03/21/2016 1:29 PM

## 2016-03-21 NOTE — Procedures (Signed)
Right double-lumen basilic vein PICC placed. Length 44 cm. Tip SVC/right atrial junction. No immediate compilations. Okay to use. Medication used- 1 % lidocaine to skin and subcutaneous tissue.

## 2016-03-21 NOTE — Care Management Note (Signed)
Case Management Note  Patient Details  Name: Octavia Metro MRN: [id number] Date of Birth: 1951/02/05  Subjective/Objective:  Awaiting for PICC.[facility] is still waiting on final auth for LTAC-rep Bryan following-he will let floor nurse know. Once auth provided-Nsg to await name of attending, bed,rm, call report- COBRA form to be completed & signed,& transport by [facility] tel#[phone]-Nsg to manage.                  Action/Plan:d/c-acute to acute transfer   Expected Discharge Date:                  Expected Discharge Plan:  Long Term Acute Care (LTAC)  In-House Referral:     Discharge planning Services  CM Consult  Post Acute Care Choice:    Choice offered to:  Patient  DME Arranged:    DME Agency:     HH Arranged:    [place] Agency:     Status of Service:  In process, will continue to follow  If discussed at Long Length of Stay Meetings, dates discussed:    Additional Comments:  Dessa Phi, RN 03/21/2016, 10:21 AM

## 2016-03-21 NOTE — Care Management Note (Signed)
Case Management Note  Patient Details  Name: Sharon Warner MRN: [id number] Date of Birth: 12-31-1950  Subjective/Objective:    Auth received for LTAC per [facility] rep-Bryan. Nsg to manage acute to acute transfer to [facility] @ [facility] by [place].                Action/Plan:d/c LTAC   Expected Discharge Date:                  Expected Discharge Plan:  Long Term Acute Care (LTAC)  In-House Referral:     Discharge planning Services  CM Consult  Post Acute Care Choice:    Choice offered to:  Patient  DME Arranged:    DME Agency:     HH Arranged:    [employer] Agency:     Status of Service:  Completed, signed off  If discussed at [employer] of Stay Meetings, dates discussed:    Additional Comments:  Dessa Phi, RN 03/21/2016, 3:23 PM

## 2016-03-21 NOTE — Care Management Note (Signed)
Case Management Note  Patient Details  Name: Jennyfer Abby MRN: [id number] Date of Birth: 1951/07/02  Subjective/Objective:Per [facility] rep Fonnie Birkenhead waiting on physical auth contract form from [place], even though patient has been approved. I have contacted med advisor with update-awaiting response.                    Action/Plan:d/c plan LTAC   Expected Discharge Date:                  Expected Discharge Plan:  Long Term Acute Care (LTAC)  In-House Referral:     Discharge planning Services  CM Consult  Post Acute Care Choice:    Choice offered to:  Patient  DME Arranged:    DME Agency:     HH Arranged:    [place] Agency:     Status of Service:  In process, will continue to follow  If discussed at Long Length of Stay Meetings, dates discussed:    Additional Comments:  Dessa Phi, RN 03/21/2016, 1:12 PM

## 2016-03-21 NOTE — Progress Notes (Signed)
Physical Therapy Wound Evaluation Patient Details  Name: Sharon Warner MRN: [id number] Date of Birth: 02-05-1951  Today's Date: 03/21/2016 Time: 1330-1410 Time Calculation (min): 40 min  Subjective  Subjective: "Isn't the bed helping with pressure?" while discussing remaining off sacrum  Pain Score:  No pain  Wound Assessment                                                              Pressure Ulcer 03/21/16 Stage IV - Full thickness tissue loss with exposed bone, tendon or muscle. HYDRO for stage IV pressure injury (Active)  Dressing Type Moist to moist;Foam 03/21/2016  4:00 PM  Dressing Changed 03/21/2016  4:00 PM  Dressing Change Frequency Twice a day 03/21/2016  4:00 PM  State of Healing Non-healing 03/21/2016  4:00 PM  % Wound base Red or Granulating 30% 03/21/2016  4:00 PM  % Wound base Black 70% 03/21/2016  4:00 PM  Peri-wound Assessment Pink;Bleeding 03/21/2016  4:00 PM  Wound Length (cm) 4.5 cm 03/21/2016  4:00 PM  Wound Width (cm) 5 cm 03/21/2016  4:00 PM  Wound Depth (cm) 4 cm 03/21/2016  4:00 PM  Tunneling (cm) tunneling circumferentially 2 cm, 3:00 and 9:00 have 3.5 cm 03/21/2016  4:00 PM  Margins Unattached edges (unapproximated) 03/21/2016  4:00 PM  Drainage Amount Moderate 03/21/2016  4:00 PM  Drainage Description Serosanguineous;Odor 03/21/2016  4:00 PM  Treatment Debridement ([facility]);Hydrotherapy (Pulse lavage);Packing (Saline gauze) 03/21/2016  4:00 PM   Hydrotherapy Pulsed lavage therapy - wound location: sacral pressure injury Pulsed Lavage with Suction (psi): 10 psi (8-12) Pulsed Lavage with Suction - Normal Saline Used: 1000 mL Pulsed Lavage Tip: Tip with splash shield Selective Debridement Selective Debridement - Location: sacral injury wound bed Selective Debridement - Tools Used: Forceps;Scissors Selective Debridement - Tissue Removed: black and brown necrotic tissue   Wound Assessment and Plan  Wound Therapy - Assess/Plan/Recommendations Wound  Therapy - Clinical Statement: 65 y.o. woman with a history of paraplegia x 4 years (wheelchair bound, chronic indwelling foley catheter), morbid obesity, HTN, DM (diet controlled), and vitamin D deficiency who just relocated to this area with her husband and admitted with sacral osteomyelitis, stage IV sacral pressure injury, and sepsis Wound Therapy - Functional Problem List: paraplegia, decreased sensation, obesity Factors Delaying/Impairing Wound Healing: Diabetes Mellitus;Immobility Hydrotherapy Plan: Debridement;Patient/family education;Pulsatile lavage with suction Wound Therapy - Frequency: 6X / week Wound Therapy - Follow Up Recommendations:  (plans for d/c to LTAC - will need continued hydrotherapy ) Wound Plan: To decrease bioburden, remove necrotic tissue and facilitate wound healing with plans as above  Wound Therapy Goals- Improve the function of patient's integumentary system by progressing the wound(s) through the phases of wound healing (inflammation - proliferation - remodeling) by: Decrease Necrotic Tissue to: 30 Decrease Necrotic Tissue - Progress: Goal set today Increase Granulation Tissue to: 70 Increase Granulation Tissue - Progress: Goal set today Improve Drainage Characteristics: Min Improve Drainage Characteristics - Progress: Goal set today Goals/treatment plan/discharge plan were made with and agreed upon by patient/family: No, Patient unable to participate in goals/treatment/discharge plan and family unavailable Time For Goal Achievement: 2 weeks Wound Therapy - Potential for Goals: Good  Goals will be updated until maximal potential achieved or discharge criteria met.  Discharge criteria: when goals achieved, discharge from hospital,  MD decision/surgical intervention, no progress towards goals, refusal/missing three consecutive treatments without notification or medical reason.  GP     Sharon Warner,Sharon Warner 03/21/2016, 4:40 PM Carmelia Bake, PT, DPT 03/21/2016 Pager:  [phone]

## 2016-03-22 LAB — COMPREHENSIVE METABOLIC PANEL
ALK PHOS: 47 U/L (ref 38–126)
ALT: 17 U/L (ref 14–54)
ANION GAP: 7 (ref 5–15)
AST: 21 U/L (ref 15–41)
Albumin: 2.4 g/dL — ABNORMAL LOW (ref 3.5–5.0)
BUN: 11 mg/dL (ref 6–20)
CALCIUM: 9.8 mg/dL (ref 8.9–10.3)
CO2: 28 mmol/L (ref 22–32)
Chloride: 105 mmol/L (ref 101–111)
Creatinine, Ser: 0.88 mg/dL (ref 0.44–1.00)
GFR calc non Af Amer: >60 mL/min (ref >60)
Glucose, Bld: 129 mg/dL — ABNORMAL HIGH (ref 65–99)
Potassium: 3.3 mmol/L — ABNORMAL LOW (ref 3.5–5.1)
SODIUM: 140 mmol/L (ref 135–145)
Total Bilirubin: 0.6 mg/dL (ref 0.3–1.2)
Total Protein: 5.7 g/dL — ABNORMAL LOW (ref 6.5–8.1)

## 2016-03-22 LAB — CBC
HCT: 34.1 % — ABNORMAL LOW (ref 36.0–46.0)
HEMOGLOBIN: 10.5 g/dL — AB (ref 12.0–15.0)
MCH: 26.9 pg (ref 26.0–34.0)
MCHC: 30.8 g/dL (ref 30.0–36.0)
MCV: 87.2 fL (ref 78.0–100.0)
Platelets: 328 10*3/uL (ref 150–400)
RBC: 3.91 MIL/uL (ref 3.87–5.11)
RDW: 16.5 % — ABNORMAL HIGH (ref 11.5–15.5)
WBC: 11.8 10*3/uL — ABNORMAL HIGH (ref 4.0–10.5)

## 2016-03-22 LAB — SEDIMENTATION RATE: SED RATE: 66 mm/h — AB (ref 0–22)

## 2016-03-22 LAB — C-REACTIVE PROTEIN: CRP: 2.7 mg/dL — AB (ref <1.0)

## 2016-03-23 LAB — RENAL FUNCTION PANEL
ALBUMIN: 2.7 g/dL — AB (ref 3.5–5.0)
ANION GAP: 7 (ref 5–15)
BUN: 11 mg/dL (ref 6–20)
CALCIUM: 9.8 mg/dL (ref 8.9–10.3)
CO2: 28 mmol/L (ref 22–32)
Chloride: 102 mmol/L (ref 101–111)
Creatinine, Ser: 0.8 mg/dL (ref 0.44–1.00)
GFR calc Af Amer: >60 mL/min (ref >60)
GLUCOSE: 140 mg/dL — AB (ref 65–99)
PHOSPHORUS: 3.7 mg/dL (ref 2.5–4.6)
POTASSIUM: 3.5 mmol/L (ref 3.5–5.1)
SODIUM: 137 mmol/L (ref 135–145)

## 2016-03-26 ENCOUNTER — Other Ambulatory Visit (HOSPITAL_COMMUNITY): Payer: Self-pay

## 2016-03-26 LAB — RENAL FUNCTION PANEL
ALBUMIN: 2.9 g/dL — AB (ref 3.5–5.0)
Anion gap: 11 (ref 5–15)
BUN: 21 mg/dL — AB (ref 6–20)
CHLORIDE: 100 mmol/L — AB (ref 101–111)
CO2: 28 mmol/L (ref 22–32)
CREATININE: 0.97 mg/dL (ref 0.44–1.00)
Calcium: 10.6 mg/dL — ABNORMAL HIGH (ref 8.9–10.3)
GFR calc Af Amer: >60 mL/min (ref >60)
Glucose, Bld: 143 mg/dL — ABNORMAL HIGH (ref 65–99)
Phosphorus: 3.7 mg/dL (ref 2.5–4.6)
Potassium: 3.5 mmol/L (ref 3.5–5.1)
Sodium: 139 mmol/L (ref 135–145)

## 2016-03-26 LAB — CBC WITH DIFFERENTIAL/PLATELET
BASOS ABS: 0.1 10*3/uL (ref 0.0–0.1)
Basophils Relative: 1 %
Eosinophils Absolute: 0.5 10*3/uL (ref 0.0–0.7)
Eosinophils Relative: 4 %
HCT: 37.1 % (ref 36.0–46.0)
Hemoglobin: 11.2 g/dL — ABNORMAL LOW (ref 12.0–15.0)
Lymphocytes Relative: 40 %
Lymphs Abs: 5.3 10*3/uL — ABNORMAL HIGH (ref 0.7–4.0)
MCH: 26.1 pg (ref 26.0–34.0)
MCHC: 30.2 g/dL (ref 30.0–36.0)
MCV: 86.5 fL (ref 78.0–100.0)
MONO ABS: 0.6 10*3/uL (ref 0.1–1.0)
Monocytes Relative: 4 %
Neutro Abs: 6.8 10*3/uL (ref 1.7–7.7)
Neutrophils Relative %: 51 %
PLATELETS: 372 10*3/uL (ref 150–400)
RBC: 4.29 MIL/uL (ref 3.87–5.11)
RDW: 16.5 % — AB (ref 11.5–15.5)
WBC: 13.2 10*3/uL — ABNORMAL HIGH (ref 4.0–10.5)

## 2016-03-26 LAB — MAGNESIUM: MAGNESIUM: 2 mg/dL (ref 1.7–2.4)

## 2016-03-26 LAB — C-REACTIVE PROTEIN: CRP: 3.2 mg/dL — ABNORMAL HIGH (ref <1.0)

## 2016-03-26 LAB — SEDIMENTATION RATE: Sed Rate: 55 mm/hr — ABNORMAL HIGH (ref 0–22)

## 2016-03-26 LAB — BASIC METABOLIC PANEL
Anion gap: 12 (ref 5–15)
BUN: 21 mg/dL — ABNORMAL HIGH (ref 6–20)
CHLORIDE: 99 mmol/L — AB (ref 101–111)
CO2: 27 mmol/L (ref 22–32)
CREATININE: 0.85 mg/dL (ref 0.44–1.00)
Calcium: 10.6 mg/dL — ABNORMAL HIGH (ref 8.9–10.3)
GFR calc non Af Amer: >60 mL/min (ref >60)
Glucose, Bld: 137 mg/dL — ABNORMAL HIGH (ref 65–99)
POTASSIUM: 3.4 mmol/L — AB (ref 3.5–5.1)
Sodium: 138 mmol/L (ref 135–145)

## 2016-03-28 LAB — BASIC METABOLIC PANEL
Anion gap: 9 (ref 5–15)
BUN: 28 mg/dL — AB (ref 6–20)
CHLORIDE: 104 mmol/L (ref 101–111)
CO2: 24 mmol/L (ref 22–32)
CREATININE: 0.8 mg/dL (ref 0.44–1.00)
Calcium: 10.5 mg/dL — ABNORMAL HIGH (ref 8.9–10.3)
GFR calc non Af Amer: >60 mL/min (ref >60)
GLUCOSE: 117 mg/dL — AB (ref 65–99)
Potassium: 3.7 mmol/L (ref 3.5–5.1)
Sodium: 137 mmol/L (ref 135–145)

## 2016-03-28 LAB — CBC WITH DIFFERENTIAL/PLATELET
Basophils Absolute: 0.1 10*3/uL (ref 0.0–0.1)
Basophils Relative: 0 %
Eosinophils Absolute: 0.1 10*3/uL (ref 0.0–0.7)
Eosinophils Relative: 1 %
HEMATOCRIT: 37.9 % (ref 36.0–46.0)
HEMOGLOBIN: 11.6 g/dL — AB (ref 12.0–15.0)
LYMPHS PCT: 37 %
Lymphs Abs: 5.8 10*3/uL — ABNORMAL HIGH (ref 0.7–4.0)
MCH: 26.7 pg (ref 26.0–34.0)
MCHC: 30.6 g/dL (ref 30.0–36.0)
MCV: 87.1 fL (ref 78.0–100.0)
MONO ABS: 1 10*3/uL (ref 0.1–1.0)
MONOS PCT: 7 %
NEUTROS ABS: 8.6 10*3/uL — AB (ref 1.7–7.7)
NEUTROS PCT: 55 %
Platelets: 402 10*3/uL — ABNORMAL HIGH (ref 150–400)
RBC: 4.35 MIL/uL (ref 3.87–5.11)
RDW: 16.2 % — AB (ref 11.5–15.5)
WBC: 15.7 10*3/uL — ABNORMAL HIGH (ref 4.0–10.5)

## 2016-03-30 LAB — BASIC METABOLIC PANEL
Anion gap: 11 (ref 5–15)
BUN: 33 mg/dL — AB (ref 6–20)
CHLORIDE: 103 mmol/L (ref 101–111)
CO2: 25 mmol/L (ref 22–32)
CREATININE: 0.83 mg/dL (ref 0.44–1.00)
Calcium: 10.8 mg/dL — ABNORMAL HIGH (ref 8.9–10.3)
GFR calc Af Amer: >60 mL/min (ref >60)
GFR calc non Af Amer: >60 mL/min (ref >60)
Glucose, Bld: 178 mg/dL — ABNORMAL HIGH (ref 65–99)
Potassium: 3.6 mmol/L (ref 3.5–5.1)
Sodium: 139 mmol/L (ref 135–145)

## 2016-03-30 LAB — CBC WITH DIFFERENTIAL/PLATELET
Basophils Absolute: 0.1 10*3/uL (ref 0.0–0.1)
Basophils Relative: 1 %
EOS ABS: 0.3 10*3/uL (ref 0.0–0.7)
Eosinophils Relative: 2 %
HEMATOCRIT: 39.1 % (ref 36.0–46.0)
HEMOGLOBIN: 11.8 g/dL — AB (ref 12.0–15.0)
LYMPHS ABS: 5.1 10*3/uL — AB (ref 0.7–4.0)
Lymphocytes Relative: 39 %
MCH: 27.1 pg (ref 26.0–34.0)
MCHC: 30.2 g/dL (ref 30.0–36.0)
MCV: 89.7 fL (ref 78.0–100.0)
MONO ABS: 0.9 10*3/uL (ref 0.1–1.0)
MONOS PCT: 7 %
NEUTROS ABS: 6.6 10*3/uL (ref 1.7–7.7)
NEUTROS PCT: 51 %
Platelets: 358 10*3/uL (ref 150–400)
RBC: 4.36 MIL/uL (ref 3.87–5.11)
RDW: 16.7 % — ABNORMAL HIGH (ref 11.5–15.5)
WBC: 13 10*3/uL — ABNORMAL HIGH (ref 4.0–10.5)

## 2016-04-03 LAB — CBC WITH DIFFERENTIAL/PLATELET
BASOS ABS: 0.1 10*3/uL (ref 0.0–0.1)
BASOS PCT: 0 %
EOS ABS: 0.5 10*3/uL (ref 0.0–0.7)
Eosinophils Relative: 4 %
HCT: 42.8 % (ref 36.0–46.0)
HEMOGLOBIN: 13.2 g/dL (ref 12.0–15.0)
LYMPHS ABS: 5.2 10*3/uL — AB (ref 0.7–4.0)
Lymphocytes Relative: 40 %
MCH: 27.6 pg (ref 26.0–34.0)
MCHC: 30.8 g/dL (ref 30.0–36.0)
MCV: 89.5 fL (ref 78.0–100.0)
Monocytes Absolute: 0.9 10*3/uL (ref 0.1–1.0)
Monocytes Relative: 7 %
NEUTROS PCT: 49 %
Neutro Abs: 6.3 10*3/uL (ref 1.7–7.7)
PLATELETS: 311 10*3/uL (ref 150–400)
RBC: 4.78 MIL/uL (ref 3.87–5.11)
RDW: 17.9 % — ABNORMAL HIGH (ref 11.5–15.5)
WBC: 12.9 10*3/uL — AB (ref 4.0–10.5)

## 2016-04-03 LAB — RENAL FUNCTION PANEL
ALBUMIN: 3.2 g/dL — AB (ref 3.5–5.0)
ANION GAP: 11 (ref 5–15)
BUN: 34 mg/dL — ABNORMAL HIGH (ref 6–20)
CALCIUM: 11.2 mg/dL — AB (ref 8.9–10.3)
CO2: 26 mmol/L (ref 22–32)
CREATININE: 0.87 mg/dL (ref 0.44–1.00)
Chloride: 102 mmol/L (ref 101–111)
Glucose, Bld: 152 mg/dL — ABNORMAL HIGH (ref 65–99)
PHOSPHORUS: 3.2 mg/dL (ref 2.5–4.6)
Potassium: 4 mmol/L (ref 3.5–5.1)
SODIUM: 139 mmol/L (ref 135–145)

## 2016-04-03 LAB — C-REACTIVE PROTEIN: CRP: 2.1 mg/dL — AB (ref <1.0)

## 2016-04-03 LAB — SEDIMENTATION RATE: SED RATE: 60 mm/h — AB (ref 0–22)

## 2016-04-03 LAB — MAGNESIUM: Magnesium: 2 mg/dL (ref 1.7–2.4)

## 2016-04-06 LAB — CBC
HEMATOCRIT: 43.1 % (ref 36.0–46.0)
HEMOGLOBIN: 13.2 g/dL (ref 12.0–15.0)
MCH: 27 pg (ref 26.0–34.0)
MCHC: 30.6 g/dL (ref 30.0–36.0)
MCV: 88.3 fL (ref 78.0–100.0)
Platelets: 271 10*3/uL (ref 150–400)
RBC: 4.88 MIL/uL (ref 3.87–5.11)
RDW: 17.9 % — ABNORMAL HIGH (ref 11.5–15.5)
WBC: 12.2 10*3/uL — AB (ref 4.0–10.5)

## 2016-04-06 LAB — BASIC METABOLIC PANEL
ANION GAP: 9 (ref 5–15)
BUN: 29 mg/dL — ABNORMAL HIGH (ref 6–20)
CHLORIDE: 104 mmol/L (ref 101–111)
CO2: 23 mmol/L (ref 22–32)
Calcium: 10.7 mg/dL — ABNORMAL HIGH (ref 8.9–10.3)
Creatinine, Ser: 0.76 mg/dL (ref 0.44–1.00)
GFR calc Af Amer: >60 mL/min (ref >60)
Glucose, Bld: 194 mg/dL — ABNORMAL HIGH (ref 65–99)
POTASSIUM: 3.9 mmol/L (ref 3.5–5.1)
SODIUM: 136 mmol/L (ref 135–145)

## 2016-04-09 LAB — CBC WITH DIFFERENTIAL/PLATELET
BASOS ABS: 0.1 10*3/uL (ref 0.0–0.1)
Basophils Relative: 1 %
Eosinophils Absolute: 0.5 10*3/uL (ref 0.0–0.7)
Eosinophils Relative: 4 %
HEMATOCRIT: 44 % (ref 36.0–46.0)
Hemoglobin: 13.7 g/dL (ref 12.0–15.0)
LYMPHS ABS: 4.6 10*3/uL — AB (ref 0.7–4.0)
LYMPHS PCT: 37 %
MCH: 27.5 pg (ref 26.0–34.0)
MCHC: 31.1 g/dL (ref 30.0–36.0)
MCV: 88.4 fL (ref 78.0–100.0)
MONO ABS: 1.1 10*3/uL — AB (ref 0.1–1.0)
Monocytes Relative: 8 %
Neutro Abs: 6.4 10*3/uL (ref 1.7–7.7)
Neutrophils Relative %: 50 %
Platelets: 242 10*3/uL (ref 150–400)
RBC: 4.98 MIL/uL (ref 3.87–5.11)
RDW: 18.3 % — AB (ref 11.5–15.5)
WBC: 12.7 10*3/uL — ABNORMAL HIGH (ref 4.0–10.5)

## 2016-04-09 LAB — MAGNESIUM: Magnesium: 2.1 mg/dL (ref 1.7–2.4)

## 2016-04-09 LAB — BASIC METABOLIC PANEL
ANION GAP: 12 (ref 5–15)
BUN: 20 mg/dL (ref 6–20)
CHLORIDE: 103 mmol/L (ref 101–111)
CO2: 20 mmol/L — ABNORMAL LOW (ref 22–32)
Calcium: 10.9 mg/dL — ABNORMAL HIGH (ref 8.9–10.3)
Creatinine, Ser: 0.71 mg/dL (ref 0.44–1.00)
GFR calc Af Amer: >60 mL/min (ref >60)
GLUCOSE: 168 mg/dL — AB (ref 65–99)
POTASSIUM: 4.8 mmol/L (ref 3.5–5.1)
Sodium: 135 mmol/L (ref 135–145)

## 2016-04-09 LAB — PHOSPHORUS: Phosphorus: 3.2 mg/dL (ref 2.5–4.6)

## 2016-04-15 ENCOUNTER — Encounter: Payer: Self-pay | Admitting: Adult Health

## 2016-04-15 ENCOUNTER — Non-Acute Institutional Stay (SKILLED_NURSING_FACILITY): Payer: BLUE CROSS/BLUE SHIELD | Admitting: Adult Health

## 2016-04-15 DIAGNOSIS — G4733 Obstructive sleep apnea (adult) (pediatric): Secondary | ICD-10-CM

## 2016-04-15 DIAGNOSIS — M4628 Osteomyelitis of vertebra, sacral and sacrococcygeal region: Secondary | ICD-10-CM

## 2016-04-15 DIAGNOSIS — E11622 Type 2 diabetes mellitus with other skin ulcer: Secondary | ICD-10-CM

## 2016-04-15 DIAGNOSIS — Z794 Long term (current) use of insulin: Secondary | ICD-10-CM

## 2016-04-15 DIAGNOSIS — I10 Essential (primary) hypertension: Secondary | ICD-10-CM

## 2016-04-15 DIAGNOSIS — L8994 Pressure ulcer of unspecified site, stage 4: Secondary | ICD-10-CM

## 2016-04-15 DIAGNOSIS — E042 Nontoxic multinodular goiter: Secondary | ICD-10-CM | POA: Diagnosis not present

## 2016-04-15 DIAGNOSIS — L089 Local infection of the skin and subcutaneous tissue, unspecified: Secondary | ICD-10-CM

## 2016-04-15 DIAGNOSIS — I7789 Other specified disorders of arteries and arterioles: Secondary | ICD-10-CM

## 2016-04-15 NOTE — Progress Notes (Signed)
Patient ID: Sharon Warner, female   DOB: 08-24-1951, 65 y.o.   MRN: [id number]   Location:   [place] Room Number: 162-B Place of Service:  SNF (31)   CODE STATUS: DNR  Allergies  Allergen Reactions  . Levaquin [Levofloxacin In D5w] Other (See Comments)    Unknown childhood allergy  . Penicillins Other (See Comments)    Unknown childhood allergy    Chief Complaint  Patient presents with  . Hospitalization Follow-up    Hospital Follow up    HPI:  She is an unfortunate woman who has had paraplegia for 4 years due to ascending paralysis with unclear etiology; is wheelchair bound. [facility] due to her sacral wound. She is here for wound management; rehab with her goal to return back home. She is not voicing any concerns or complaints at this time.    Past Medical History  Diagnosis Date  . Hypertension   . Diabetes mellitus without complication ([place])   . H/O paraplegia     ascending paralysis unclear etiology  . Chronic indwelling Foley catheter     last changed few days ago  . Morbid obesity ([place])   . Lumbar spondylosis   . OSA on CPAP   . Chronic heart failure [facility])     Past Surgical History  Procedure Laterality Date  . Vaginal hysterectomy    . Joint replacement Right     Hip  . Tonsillectomy      Social History   Social History  . Marital Status: Married    Spouse Name: N/A  . Number of Children: N/A  . Years of Education: N/A   Occupational History  . Not on file.   Social History Main Topics  . Smoking status: Never Smoker   . Smokeless tobacco: Not on file  . Alcohol Use: No  . Drug Use: No  . Sexual Activity: Not on file   Other Topics Concern  . Not on file   Social History Narrative   Patient just moved to [place] from [place]. Does have a cat at home. Brother and son live locally.   Family History  Problem Relation Age of Onset  . Hypothyroidism Mother   . Diabetes  Maternal Grandmother   . Heart disease Maternal Grandfather       VITAL SIGNS BP 135/83 mmHg  Pulse 98  Temp(Src) 98 F (36.7 C) (Oral)  Resp 20  Ht '5\' 1"'  (1.549 m)  Wt 255 lb (115.667 kg)  BMI 48.21 kg/m2  SpO2 94%  Patient's Medications  New Prescriptions   No medications on file  Previous Medications   ASCORBIC ACID (VITAMIN C) 500 MG TABLET    Take 500 mg by mouth 2 (two) times daily.   ASPIRIN EC 81 MG TABLET    Take 81 mg by mouth daily.   B COMPLEX-C (B-COMPLEX WITH VITAMIN C) TABLET    Take 1 tablet by mouth daily.   CEFTRIAXONE 2 G IN DEXTROSE 5 % 50 ML    Inject 2 g into the vein daily.   CHOLECALCIFEROL (VITAMIN D) 1000 UNITS TABLET    Take 1,000 Units by mouth daily.   DOCUSATE SODIUM (COLACE) 100 MG CAPSULE    Take 100 mg by mouth 2 (two) times daily.   FAMOTIDINE (PEPCID) 20 MG TABLET    Take 20 mg by mouth 2 (two) times daily.   FUROSEMIDE (LASIX) 20 MG TABLET  Take 20 mg by mouth daily.   HEPARIN 5000 UNIT/ML INJECTION    Inject 5,000 Units into the skin every 8 (eight) hours.   INSULIN GLARGINE (LANTUS) 100 UNIT/ML INJECTION    Inject 50 Units into the skin at bedtime.   INSULIN LISPRO (HUMALOG) 100 UNIT/ML INJECTION    Inject into the skin. Inject as per sliding scale: if  0-149 =0 units; 150- 500 = 5 units, subcutaneously before meals related to DM.   LACTOBACILLUS TABS    250 mg by mouth bid   METOPROLOL SUCCINATE (TOPROL-XL) 100 MG 24 HR TABLET    Take 1 tablet (100 mg total) by mouth daily. Take with or immediately following a meal.   METRONIDAZOLE (FLAGYL) 500 MG TABLET    Take 1 tablet (500 mg total) by mouth every 6 (six) hours.   POTASSIUM CHLORIDE (K-DUR) 10 MEQ TABLET    Take 10 mEq by mouth daily   SENNA (SENOKOT) 8.6 MG TABS TABLET    Take 1 tablet by mouth at bedtime.   ZINC SULFATE 220 (50 ZN) MG CAPSULE    Take 220 mg by mouth daily.  Modified Medications   No medications on file  Discontinued Medications     SIGNIFICANT DIAGNOSTIC  EXAMS  03-14-16:  Ct angio of chest: Limited evaluation for pulmonary emboli due to motion artifact and poor opacification of the pulmonary arteries. No evidence for a large central pulmonary embolism but poor characterization of the pulmonary arteries beyond the main pulmonary arteries. Enlarged main pulmonary artery could represent underlying pulmonary hypertension. Enlargement of the ascending thoracic aorta measuring up to 4.2 cm.  Scattered ground-glass densities in lungs likely related to atelectasis. Probable hepatomegaly. Thyroid nodules. Recommend further characterization with thyroid ultrasound.  03-25-26: mri of sacrum: Large sacral decubitus ulcer extends to bone. Low level edema is seen in the distal sacrum and coccyx is compatible with osteomyelitis. Negative for abscess. Thickened appearance of the walls of the rectosigmoid colon worrisome for proctitis.   LABS REVIEWED;   03-14-16: tsh 1.785; free T4; 1.23; hgb a1c 8.4 03-22-16: wbc 11.8; hgb 10.5; hct34.1; mcv 87.5; plt 328; glucose 129; bun 11; creat 0.88; k+ 3.3; na++ 140; liver normal albumin 2.4 04-03-16 wbc 12.9; hgb 13.2; hct 42.8; mcv 89.;5 plt 311; glucose 152; bun 34 ;creat 0.87; k+ 4.0; na++ 139; phos 3.2; mag 2.0; albumin 3.2 04-09-16: wbc 12.7; hgb 13.7; hct 44.0; mcv 88.4; plt 242; glucose 168; bun 20; creat 0.71; k+ 4.8; na++ 135; mag 2.1; phos 3.2     Review of Systems  Constitutional: Negative for malaise/fatigue.  Respiratory: Negative for cough and shortness of breath.   Cardiovascular: Negative for chest pain, palpitations and leg swelling.  Gastrointestinal: Negative for heartburn, abdominal pain and constipation.  Musculoskeletal: Negative for myalgias, back pain and joint pain.       Has no feeling below waist   Skin:       Has large sacral wound   Neurological: Negative for dizziness.  Psychiatric/Behavioral: The patient is not nervous/anxious.       Physical Exam  Constitutional: She is oriented  to person, place, and time. She appears well-developed and well-nourished. No distress.  Obese   Eyes: Conjunctivae are normal.  Neck: Neck supple. No JVD present.  Thyroid gland slightly enlarged diffusely   Cardiovascular: Normal rate, regular rhythm and intact distal pulses.   Respiratory: Effort normal and breath sounds normal. No respiratory distress. She has no wheezes.  GI: Soft. Bowel sounds are  normal. She exhibits no distension. There is no tenderness.  Genitourinary:  Has foley   Musculoskeletal: She exhibits edema.  Is able to move upper extremities Has bilateral lower extremity paralysis  Has trace bilateral lower extremity edema   Lymphadenopathy:    She has no cervical adenopathy.  Neurological: She is alert and oriented to person, place, and time.  Skin: Skin is warm and dry. She is not diaphoretic.  Lower extremities discolored Sacral wound large approx 3 cm in diameter and 4 cm deep with slough present on wound bed. No foul odor present   Psychiatric: She has a normal mood and affect.       ASSESSMENT/ PLAN:  1. OSA: will continue cpap nightly will monitor  2. Thoracic ascending  aneurysm:  4.2 cm will continue blood pressure control is on toprol xl 100 mg daily asa 81 mg daily   3. Chronic heart failure; will continue lasix 20 mg daily with k+ 10 meq daily   4. Hypertension: will continue toprol xl 100 mg daily   5. Diabetes: hgb a1c is 8.4; will continue lantus 50 units nightly and will continue humalog 5 units for cbg >=150  6. Gerd: will continue pepcid 20 mg twice daily   7.  Constipation: will continue colace twice daily and senna daily   8. Paraplegia: is chronic; has had for 4 years; is wheelchair bound; will continue heparin 5000 units three times daily for prophylaxis  9. Neurogenic bladder: has chronic foley  10. Sacral wound stage IV: with osteomyelitis and sepsis: will continue rocephin 2 gm daily and flagyl 500 mg every 6 hours through  04-23-16. Will use santyl and calcium alginate daily   11. Thyroid nodule: tsh is 1.785; will setup a thyroid ultrasound.    Will check cbc; bmp; crp esr   Time spent with patient  50  minutes >50% time spent counseling; reviewing medical record; tests; labs; and developing future plan of care    Ok Edwards NP [facility]  Contact [phone] Monday through Friday 8am- 5pm  After hours call [phone]

## 2016-04-16 ENCOUNTER — Encounter: Payer: Self-pay | Admitting: Internal Medicine

## 2016-04-16 ENCOUNTER — Non-Acute Institutional Stay (SKILLED_NURSING_FACILITY): Payer: BLUE CROSS/BLUE SHIELD | Admitting: Internal Medicine

## 2016-04-16 DIAGNOSIS — N319 Neuromuscular dysfunction of bladder, unspecified: Secondary | ICD-10-CM

## 2016-04-16 DIAGNOSIS — I10 Essential (primary) hypertension: Secondary | ICD-10-CM | POA: Diagnosis not present

## 2016-04-16 DIAGNOSIS — G8220 Paraplegia, unspecified: Secondary | ICD-10-CM | POA: Diagnosis not present

## 2016-04-16 DIAGNOSIS — E11622 Type 2 diabetes mellitus with other skin ulcer: Secondary | ICD-10-CM

## 2016-04-16 DIAGNOSIS — M4628 Osteomyelitis of vertebra, sacral and sacrococcygeal region: Secondary | ICD-10-CM | POA: Diagnosis not present

## 2016-04-16 DIAGNOSIS — E042 Nontoxic multinodular goiter: Secondary | ICD-10-CM | POA: Insufficient documentation

## 2016-04-16 DIAGNOSIS — I7789 Other specified disorders of arteries and arterioles: Secondary | ICD-10-CM

## 2016-04-16 DIAGNOSIS — L8994 Pressure ulcer of unspecified site, stage 4: Secondary | ICD-10-CM

## 2016-04-16 DIAGNOSIS — E46 Unspecified protein-calorie malnutrition: Secondary | ICD-10-CM | POA: Diagnosis not present

## 2016-04-16 DIAGNOSIS — G4733 Obstructive sleep apnea (adult) (pediatric): Secondary | ICD-10-CM | POA: Diagnosis not present

## 2016-04-16 DIAGNOSIS — Z794 Long term (current) use of insulin: Secondary | ICD-10-CM

## 2016-04-16 DIAGNOSIS — E11628 Type 2 diabetes mellitus with other skin complications: Secondary | ICD-10-CM | POA: Insufficient documentation

## 2016-04-16 DIAGNOSIS — L089 Local infection of the skin and subcutaneous tissue, unspecified: Secondary | ICD-10-CM

## 2016-04-16 NOTE — Progress Notes (Signed)
Patient ID: Sharon Warner, female   DOB: 11-12-1950, 65 y.o.   MRN: [id number]     HISTORY AND PHYSICAL   DATE:  04/16/16  Location:  Nursing Home Location: [place] Room Number: [place] of Service: SNF (31)   Extended Emergency Contact Information Primary Emergency Contact: [place] Mobile Phone: [phone] Relation: Spouse  Advanced Directive information Does patient have an advance directive?: No, Would patient like information on creating an advanced directive?: No - patient declined information   Chief Complaint  Patient presents with  . New Admit To SNF    HPI:  65 yo female seen today as a new admission into SNF from [facility] after prolonged hospital stay for sacral osteomyelitis, generalized weakness, AKI, neurogenic bladder, paraplegic, thoracic aneurysm,HTN, multinodular goiter, HTN, Vitamin D deficiency, DM type 2, OSA on CPAP. She was dx with sepsis due to E coli, Strep Group F and GVR. She is on IV rocephin and po flagyl thru 7/25th.  MRI sacrum (+) osteomyelitis. WBC 12.7K; Hgb 13.7; plts 242K; Na 135; K 4.8; Cr 0.7; Ca 10.9; Mg 2.1; Phos 3.2; A1c 8.4%; TSH 1.785 with freeT4 elevated at 1.23 and thyroid US done -->multinodular goiter; albumin 2.4. Imaging revealed 4.2cm thoracic aneurysm. foley cath inserted for comfort due to sacral ulcer and neurogenic bladder. Followed by ID. She rec'd hydrotherapy at [facility] for sacral wound. She presents to SNF for short term rehab  She has c/a thoracic aneurysm that she was unaware of. She is also c/a sacral wound and whether or not it is healing. No f/c. She is getting IV rocephin and po flagyl. No nursing issues. Attempts to make healthy food choices. Poor exercise tolerance due to weight and weakness. She reports FHx hypo and hyperthyroidism. She moved from [place] a few days prior to original hospitalization   OSA - stable on CPAP qHS  Presumed Chronic heart  failure - stable on lasix 20 mg daily with k+ 10 meq daily. No recent 2 D echo  Hypertension - BP stable on toprol xl 100 mg daily. Takes ASA dialy  DM type 2 - uncontrolled with A1c of 8.4; takes lantus 50 units nightly and humalog 5 units for cbg >=150  GERD - stable on pepcid 20 mg twice daily   Constipation - stable on colace twice daily and senna daily   Paraplegia - etiology unknown; has had for 4 years; is wheelchair bound; gets heparin 5000 units three times daily for DVT prophylaxis  Neurogenic bladder - s/p chronic foley      Past Medical History  Diagnosis Date  . Hypertension   . Diabetes mellitus without complication ([place])   . H/O paraplegia     ascending paralysis unclear etiology  . Chronic indwelling Foley catheter     last changed few days ago  . Morbid obesity ([place])   . Lumbar spondylosis   . OSA on CPAP   . Chronic heart failure [facility])     Past Surgical History  Procedure Laterality Date  . Vaginal hysterectomy    . Joint replacement Right     Hip  . Tonsillectomy      Patient Care Team: Dibas Koirala, MD as PCP - General (Family Medicine)  Social History   Social History  . Marital Status: Married    Spouse Name: N/A  . Number of Children: N/A  . Years of Education: N/A   Occupational History  . Not on  file.   Social History Main Topics  . Smoking status: Never Smoker   . Smokeless tobacco: Not on file  . Alcohol Use: No  . Drug Use: No  . Sexual Activity: Not on file   Other Topics Concern  . Not on file   Social History Narrative   Patient just moved to [place] from [place]. Does have a cat at home. Brother and son live locally.     reports that she has never smoked. She does not have any smokeless tobacco history on file. She reports that she does not drink alcohol or use illicit drugs.  Family History  Problem Relation Age of Onset  . Hypothyroidism Mother   . Diabetes Maternal Grandmother   . Heart disease  Maternal Grandfather    No family status information on file.     There is no immunization history on file for this patient.  Allergies  Allergen Reactions  . Levaquin [Levofloxacin In D5w] Other (See Comments)    Unknown childhood allergy  . Penicillins Other (See Comments)    Unknown childhood allergy    Medications: Patient's Medications  New Prescriptions   No medications on file  Previous Medications   ASCORBIC ACID (VITAMIN C) 500 MG TABLET    Take 500 mg by mouth 2 (two) times daily.   ASPIRIN EC 81 MG TABLET    Take 81 mg by mouth daily.   B COMPLEX-C (B-COMPLEX WITH VITAMIN C) TABLET    Take 1 tablet by mouth daily.   CEFTRIAXONE 2 G IN DEXTROSE 5 % 50 ML    Inject 2 g into the vein daily.   CHOLECALCIFEROL (VITAMIN D) 1000 UNITS TABLET    Take 1,000 Units by mouth daily.   DOCUSATE SODIUM (COLACE) 100 MG CAPSULE    Take 100 mg by mouth 2 (two) times daily.   FAMOTIDINE (PEPCID) 20 MG TABLET    Take 20 mg by mouth 2 (two) times daily.   FUROSEMIDE (LASIX) 20 MG TABLET    Take 20 mg by mouth daily.   HEPARIN 5000 UNIT/ML INJECTION    Inject 5,000 Units into the skin every 8 (eight) hours.   INSULIN GLARGINE (LANTUS) 100 UNIT/ML INJECTION    Inject 50 Units into the skin at bedtime.   INSULIN LISPRO (HUMALOG) 100 UNIT/ML INJECTION    Inject into the skin. Inject as per sliding scale: if  0-149 =0 units; 150- 500 = 5 units, subcutaneously before meals related to DM.   LACTOBACILLUS TABS    250 mg by mouth bid   METOPROLOL SUCCINATE (TOPROL-XL) 100 MG 24 HR TABLET    Take 1 tablet (100 mg total) by mouth daily. Take with or immediately following a meal.   METRONIDAZOLE (FLAGYL) 500 MG TABLET    Take 1 tablet (500 mg total) by mouth every 6 (six) hours.   POTASSIUM CHLORIDE (K-DUR) 10 MEQ TABLET    Take 10 mEq by mouth once.    SENNA (SENOKOT) 8.6 MG TABS TABLET    Take 1 tablet by mouth at bedtime.   SODIUM CHLORIDE 0.9 % INJECTION    Inject 10 mLs into the vein.   ZINC  SULFATE 220 (50 ZN) MG CAPSULE    Take 220 mg by mouth daily.  Modified Medications   No medications on file  Discontinued Medications   No medications on file    Review of Systems  Constitutional: Positive for fatigue. Negative for fever and chills.  Musculoskeletal: Positive  for arthralgias and gait problem.  Skin: Positive for wound.  All other systems reviewed and are negative.   Filed Vitals:   04/16/16 0906  BP: 135/83  Pulse: 98  Temp: 97.4 F (36.3 C)  TempSrc: Oral  Resp: 20  Height: 5\' 5"  (1.651 m)  Weight: 255 lb (115.667 kg)  SpO2: 94%   Body mass index is 42.43 kg/(m^2).  Physical Exam  Constitutional: She is oriented to person, place, and time. She appears well-developed.  Sitting up in bed in NAD, frail appearing  HENT:  Mouth/Throat: Oropharynx is clear and moist. No oropharyngeal exudate.  Eyes: Pupils are equal, round, and reactive to light. No scleral icterus.  Neck: Neck supple. Carotid bruit is not present. No tracheal deviation present. Thyromegaly present.  Cardiovascular: Normal rate, regular rhythm and intact distal pulses.  Exam reveals no gallop and no friction rub.   Murmur (1/6 SEM) heard. +1 pitting LE edema b/l. No calf TTP. Left forearm PICC line intact with no secondary signs of infection at insertion site  Pulmonary/Chest: Effort normal and breath sounds normal. No stridor. No respiratory distress. She has no wheezes. She has no rales.  Abdominal: Soft. Bowel sounds are normal. She exhibits no distension and no mass. There is no hepatomegaly. There is no tenderness. There is no rebound and no guarding.  Morbidly obese  Musculoskeletal: She exhibits edema.  Lymphadenopathy:    She has no cervical adenopathy.  Neurological: She is alert and oriented to person, place, and time. She displays atrophy. She exhibits abnormal muscle tone.  Skin: Skin is warm and dry. No rash noted.  Sacral stage 4 pressure wound, no bone seen per wound care. No  purulent d/c.  Psychiatric: She has a normal mood and affect. Her behavior is normal. Judgment and thought content normal.     Labs reviewed: Admission on 03/21/2016, Discharged on 04/11/2016  No results displayed because visit has over 200 results.  CBC Latest Ref Rng 04/09/2016 04/06/2016 04/03/2016  WBC 4.0 - 10.5 K/uL 12.7(H) 12.2(H) 12.9(H)  Hemoglobin 12.0 - 15.0 g/dL 13.7 13.2 13.2  Hematocrit 36.0 - 46.0 % 44.0 43.1 42.8  Platelets 150 - 400 K/uL 242 271 311    CMP Latest Ref Rng 04/09/2016 04/06/2016 04/03/2016  Glucose 65 - 99 mg/dL 168(H) 194(H) 152(H)  BUN 6 - 20 mg/dL 20 29(H) 34(H)  Creatinine 0.44 - 1.00 mg/dL 0.71 0.76 0.87  Sodium 135 - 145 mmol/L 135 136 139  Potassium 3.5 - 5.1 mmol/L 4.8 3.9 4.0  Chloride 101 - 111 mmol/L 103 104 102  CO2 22 - 32 mmol/L 20(L) 23 26  Calcium 8.9 - 10.3 mg/dL 10.9(H) 10.7(H) 11.2(H)   Lab Results  Component Value Date   HGBA1C 8.4* 03/14/2016   Lipid Panel  No results found for: CHOL, TRIG, HDL, CHOLHDL, VLDL, LDLCALC, LDLDIRECT     Admission on 03/13/2016, Discharged on 03/21/2016  No results displayed because visit has over 200 results.      Ir Fluoro Guide Cv Line Right  03/21/2016  INDICATION: Sacral osteomyelitis; central venous access requested for antibiotic therapy. EXAM: RIGHT UPPER EXTREMITY PICC LINE PLACEMENT WITH ULTRASOUND AND FLUOROSCOPIC GUIDANCE MEDICATIONS: 1% lidocaine to skin and subcutaneous tissue ANESTHESIA/SEDATION: None FLUOROSCOPY TIME:  Fluoroscopy Time:  24 seconds COMPLICATIONS: None immediate. PROCEDURE: The patient was advised of the possible risks and complications and agreed to undergo the procedure. The patient was then brought to the angiographic suite for the procedure. The right arm was prepped with  chlorhexidine, draped in the usual sterile fashion using maximum barrier technique (cap and mask, sterile gown, sterile gloves, large sterile sheet, hand hygiene and cutaneous antisepsis) and infiltrated  locally with 1% Lidocaine. Ultrasound demonstrated patency of the right basilic vein, and this was documented with an image. Under real-time ultrasound guidance, this vein was accessed with a 21 gauge micropuncture needle and image documentation was performed. A 0.018 wire was introduced in to the vein. Over this, a 5 [place] double lumen power injectable PICC was advanced to the lower SVC/right atrial junction. Fluoroscopy during the procedure and fluoro spot radiograph confirms appropriate catheter position. The catheter was flushed and covered with a sterile dressing. Catheter length:  44 cm IMPRESSION: Successful right arm Power PICC line placement with ultrasound and fluoroscopic guidance. The catheter is ready for use. Read by: Rowe Robert, PA-C Electronically Signed   By: Markus Daft M.D.   On: 03/21/2016 13:01   Ir US Guide Vasc Access Right  03/21/2016  INDICATION: Sacral osteomyelitis; central venous access requested for antibiotic therapy. EXAM: RIGHT UPPER EXTREMITY PICC LINE PLACEMENT WITH ULTRASOUND AND FLUOROSCOPIC GUIDANCE MEDICATIONS: 1% lidocaine to skin and subcutaneous tissue ANESTHESIA/SEDATION: None FLUOROSCOPY TIME:  Fluoroscopy Time:  24 seconds COMPLICATIONS: None immediate. PROCEDURE: The patient was advised of the possible risks and complications and agreed to undergo the procedure. The patient was then brought to the angiographic suite for the procedure. The right arm was prepped with chlorhexidine, draped in the usual sterile fashion using maximum barrier technique (cap and mask, sterile gown, sterile gloves, large sterile sheet, hand hygiene and cutaneous antisepsis) and infiltrated locally with 1% Lidocaine. Ultrasound demonstrated patency of the right basilic vein, and this was documented with an image. Under real-time ultrasound guidance, this vein was accessed with a 21 gauge micropuncture needle and image documentation was performed. A 0.018 wire was introduced in to the vein.  Over this, a 5 [place] double lumen power injectable PICC was advanced to the lower SVC/right atrial junction. Fluoroscopy during the procedure and fluoro spot radiograph confirms appropriate catheter position. The catheter was flushed and covered with a sterile dressing. Catheter length:  44 cm IMPRESSION: Successful right arm Power PICC line placement with ultrasound and fluoroscopic guidance. The catheter is ready for use. Read by: Rowe Robert, PA-C Electronically Signed   By: Markus Daft M.D.   On: 03/21/2016 13:01     Assessment/Plan   ICD-9-CM ICD-10-CM   1. Sacral osteomyelitis (HCC) 730.28 M46.28   2. Infected decubitus ulcer, stage IV ([place]) 707.00 L89.94    707.24    3. Enlarged thoracic aorta (HCC) 447.8 I77.89    4.2 cm  4. Type 2 diabetes mellitus with other skin ulcer, with long-term current use of insulin (HCC)  E11.622     Z79.4   5. Protein-calorie malnutrition (HCC) 263.9 E46   6. Multinodular goiter 241.1 E04.2   7. Obstructive sleep apnea 327.23 G47.33   8. Essential hypertension 401.9 I10   9. Paraplegia (HCC) 344.1 G82.20   10. Neurogenic bladder 596.54 N31.9   11. Morbid obesity, unspecified obesity type ([place]) 278.01 E66.01    Labs ordered already  Cont wound care as ordered  PICC line care as indicated  Foley cath care as indicated  Use CPAP qhs as ordered  F/u with ID as scheduled  F/u with other specialists as scheduled. She will need CT surgery eval for thoracic aneurysm f/u  PT/OT/ST as ordered  Nutritional supplements as ordered  GOAL: short term rehab  and d/c home when medically appropriate. Communicated with pt and nursing.  Will follow  Brentney Goldbach S. Perlie Gold  [facility] [address] [place], [place] [address] [phone] Cell (Monday-Friday 8 AM - 5 PM) [phone] After 5 PM and follow prompts

## 2016-04-17 LAB — CBC AND DIFFERENTIAL
HCT: 41 % (ref 36–46)
Hemoglobin: 13.5 g/dL (ref 12.0–16.0)
WBC: 9.4 10*3/mL

## 2016-04-17 LAB — BASIC METABOLIC PANEL
BUN: 14 mg/dL (ref 4–21)
CREATININE: 0.7 mg/dL (ref 0.5–1.1)
Glucose: 153 mg/dL
Potassium: 3.6 mmol/L (ref 3.4–5.3)
Sodium: 139 mmol/L (ref 137–147)

## 2016-04-17 LAB — POCT ERYTHROCYTE SEDIMENTATION RATE, NON-AUTOMATED: SED RATE: 42 mm

## 2016-04-18 ENCOUNTER — Encounter: Payer: Self-pay | Admitting: Adult Health

## 2016-04-18 ENCOUNTER — Non-Acute Institutional Stay (SKILLED_NURSING_FACILITY): Payer: BLUE CROSS/BLUE SHIELD | Admitting: Adult Health

## 2016-04-18 DIAGNOSIS — E042 Nontoxic multinodular goiter: Secondary | ICD-10-CM | POA: Diagnosis not present

## 2016-04-18 LAB — CALCIUM: Calcium: 10.6 mg/dL

## 2016-04-18 NOTE — Progress Notes (Signed)
Patient ID: Sharon Warner, female   DOB: 1951-06-18, 65 y.o.   MRN: [id number]    Location:   [place] Room Number: 151-C Place of Service:  SNF (31)   CODE STATUS: dnr  Allergies  Allergen Reactions  . Levaquin [Levofloxacin In D5w] Other (See Comments)    Unknown childhood allergy  . Penicillins Other (See Comments)    Unknown childhood allergy    Chief Complaint  Patient presents with  . Acute Visit    Thyroid    HPI:  Her thyroid ultrasound does demonstrate a solid right thyroid mass. Her calcium level is continuing to improve. We did discuss her workup which will include an endocrinology consult with a biopsy. She did verbalize understanding. There are no nursing concerns at this time.    Past Medical History  Diagnosis Date  . Hypertension   . Diabetes mellitus without complication ([place])   . H/O paraplegia     ascending paralysis unclear etiology  . Chronic indwelling Foley catheter     last changed few days ago  . Morbid obesity ([place])   . Lumbar spondylosis   . OSA on CPAP   . Chronic heart failure [facility])     Past Surgical History  Procedure Laterality Date  . Vaginal hysterectomy    . Joint replacement Right     Hip  . Tonsillectomy      Social History   Social History  . Marital Status: Married    Spouse Name: N/A  . Number of Children: N/A  . Years of Education: N/A   Occupational History  . Not on file.   Social History Main Topics  . Smoking status: Never Smoker   . Smokeless tobacco: Not on file  . Alcohol Use: No  . Drug Use: No  . Sexual Activity: Not on file   Other Topics Concern  . Not on file   Social History Narrative   Patient just moved to [place] from [place]. Does have a cat at home. Brother and son live locally.   Family History  Problem Relation Age of Onset  . Hypothyroidism Mother   . Diabetes Maternal Grandmother   . Heart disease Maternal Grandfather       VITAL SIGNS BP 108/54  mmHg  Pulse 82  Temp(Src) 98.6 F (37 C) (Oral)  Resp 18  Ht 5\' 5"  (1.651 m)  Wt 255 lb (115.667 kg)  BMI 42.43 kg/m2  SpO2 94%  Patient's Medications  New Prescriptions   No medications on file  Previous Medications   ASCORBIC ACID (VITAMIN C) 500 MG TABLET    Take 500 mg by mouth 2 (two) times daily.   ASPIRIN EC 81 MG TABLET    Take 81 mg by mouth daily.   B COMPLEX-C (B-COMPLEX WITH VITAMIN C) TABLET    Take 1 tablet by mouth daily.   CEFTRIAXONE 2 G IN DEXTROSE 5 % 50 ML    Inject 2 g into the vein daily.   CHOLECALCIFEROL (VITAMIN D) 1000 UNITS TABLET    Take 1,000 Units by mouth daily.   DOCUSATE SODIUM (COLACE) 100 MG CAPSULE    Take 100 mg by mouth 2 (two) times daily.   FAMOTIDINE (PEPCID) 20 MG TABLET    Take 20 mg by mouth 2 (two) times daily.   FUROSEMIDE (LASIX) 20 MG TABLET    Take 20 mg by mouth daily.   HEPARIN 5000 UNIT/ML INJECTION    Inject 5,000  Units into the skin every 8 (eight) hours.   INSULIN GLARGINE (LANTUS) 100 UNIT/ML INJECTION    Inject 50 Units into the skin at bedtime.   INSULIN LISPRO (HUMALOG) 100 UNIT/ML INJECTION    Inject into the skin. Inject as per sliding scale: if  0-149 =0 units; 150- 500 = 5 units, subcutaneously before meals related to DM.   LACTOBACILLUS TABS    250 mg by mouth bid   METOPROLOL SUCCINATE (TOPROL-XL) 100 MG 24 HR TABLET    Take 1 tablet (100 mg total) by mouth daily. Take with or immediately following a meal.   METRONIDAZOLE (FLAGYL) 500 MG TABLET    Take 1 tablet (500 mg total) by mouth every 6 (six) hours.   POTASSIUM CHLORIDE (K-DUR) 10 MEQ TABLET    Take 10 mEq by mouth once.    SENNA (SENOKOT) 8.6 MG TABS TABLET    Take 1 tablet by mouth at bedtime.   SODIUM CHLORIDE 0.9 % INJECTION    Inject 10 mLs into the vein.   ZINC SULFATE 220 (50 ZN) MG CAPSULE    Take 220 mg by mouth daily.  Modified Medications   No medications on file  Discontinued Medications   No medications on file     SIGNIFICANT DIAGNOSTIC  EXAMS  03-14-16:  Ct angio of chest: Limited evaluation for pulmonary emboli due to motion artifact and poor opacification of the pulmonary arteries. No evidence for a large central pulmonary embolism but poor characterization of the pulmonary arteries beyond the main pulmonary arteries. Enlarged main pulmonary artery could represent underlying pulmonary hypertension. Enlargement of the ascending thoracic aorta measuring up to 4.2 cm.  Scattered ground-glass densities in lungs likely related to atelectasis. Probable hepatomegaly. Thyroid nodules. Recommend further characterization with thyroid ultrasound.  03-25-26: mri of sacrum: Large sacral decubitus ulcer extends to bone. Low level edema is seen in the distal sacrum and coccyx is compatible with osteomyelitis. Negative for abscess. Thickened appearance of the walls of the rectosigmoid colon worrisome for proctitis.  04-16-16: thyroid ultrasound; bilateral heterogenous thyroid lobes. Focal irregular mass right mid thyroid. 1.4 x 1.6 x 1.2 cm    LABS REVIEWED;   03-14-16: tsh 1.785; free T4; 1.23; hgb a1c 8.4 03-22-16: wbc 11.8; hgb 10.5; hct34.1; mcv 87.5; plt 328; glucose 129; bun 11; creat 0.88; k+ 3.3; na++ 140; ca++ 9.8  liver normal albumin 2.4 sed rate 66; CRP 2.7 04-03-16 wbc 12.9; hgb 13.2; hct 42.8; mcv 89.;5 plt 311; glucose 152; bun 34 ;creat 0.87; k+ 4.0; na++ 139;ca++ 11.2 phos 3.2; mag 2.0; albumin 3.2 sed rate 60; CRP 2.1 04-09-16: wbc 12.7; hgb 13.7; hct 44.0; mcv 88.4; plt 242; glucose 168; bun 20; creat 0.71; k+ 4.8; na++ 135; ca++ 10.9 mag 2.1; phos 3.2  04-17-16; wbc 9.4; hgb 13.5; hct 41.0; mcv 84.5; plt 241; glucose 153; bun 14; creat 0.66; k+ 3.6; na++ 139; ca++ 10.6; sed rate 42     Review of Systems  Constitutional: Negative for malaise/fatigue.  Respiratory: Negative for cough and shortness of breath.   Cardiovascular: Negative for chest pain, palpitations and leg swelling.  Gastrointestinal: Negative for heartburn,  abdominal pain and constipation.  Musculoskeletal: Negative for myalgias, back pain and joint pain.       Has no feeling below waist   Skin:       Has large sacral wound   Neurological: Negative for dizziness.  Psychiatric/Behavioral: The patient is not nervous/anxious.       Physical Exam  Constitutional: She is oriented to person, place, and time. She appears well-developed and well-nourished. No distress.  Obese   Eyes: Conjunctivae are normal.  Neck: Neck supple. No JVD present.  Thyroid gland slightly enlarged diffusely has right nodule present Cardiovascular: Normal rate, regular rhythm and intact distal pulses.   Respiratory: Effort normal and breath sounds normal. No respiratory distress. She has no wheezes.  GI: Soft. Bowel sounds are normal. She exhibits no distension. There is no tenderness.  Genitourinary:  Has foley   Musculoskeletal: She exhibits edema.  Is able to move upper extremities Has bilateral lower extremity paralysis  Has trace bilateral lower extremity edema   Lymphadenopathy:    She has no cervical adenopathy.  Neurological: She is alert and oriented to person, place, and time.  Skin: Skin is warm and dry. She is not diaphoretic.  Lower extremities discolored Sacral wound large approx 3 cm in diameter and 4 cm deep with slough present on wound bed. No foul odor present   Psychiatric: She has a normal mood and affect.       ASSESSMENT/ PLAN:  1. Thyroid nodule: tsh is 1.785; thyroid ultrasound demonstrates irregular solid mass right  mid pole 1.4 x 1.6 x 1.2 cm she will require an endocrinology consult for biopsy. Will check thyroid antibodies will monitor her status.   2. Hypercalcemia: is slowly improving her current calcium is 10.6; with a calcium max of 11.2   Will continue to monitor    Ok Edwards NP [facility]  Contact [phone] Monday through Friday 8am- 5pm  After hours call [phone]

## 2016-05-06 ENCOUNTER — Inpatient Hospital Stay: Admitting: Internal Medicine

## 2016-05-30 ENCOUNTER — Non-Acute Institutional Stay (SKILLED_NURSING_FACILITY): Payer: BLUE CROSS/BLUE SHIELD | Admitting: Adult Health

## 2016-05-30 ENCOUNTER — Encounter: Payer: Self-pay | Admitting: Adult Health

## 2016-05-30 DIAGNOSIS — Z794 Long term (current) use of insulin: Secondary | ICD-10-CM | POA: Diagnosis not present

## 2016-05-30 DIAGNOSIS — G4733 Obstructive sleep apnea (adult) (pediatric): Secondary | ICD-10-CM

## 2016-05-30 DIAGNOSIS — E042 Nontoxic multinodular goiter: Secondary | ICD-10-CM

## 2016-05-30 DIAGNOSIS — E11622 Type 2 diabetes mellitus with other skin ulcer: Secondary | ICD-10-CM | POA: Diagnosis not present

## 2016-05-30 DIAGNOSIS — I10 Essential (primary) hypertension: Secondary | ICD-10-CM

## 2016-05-30 DIAGNOSIS — G8220 Paraplegia, unspecified: Secondary | ICD-10-CM

## 2016-05-30 DIAGNOSIS — L8990 Pressure ulcer of unspecified site, unspecified stage: Secondary | ICD-10-CM | POA: Diagnosis not present

## 2016-05-30 NOTE — Progress Notes (Signed)
Patient ID: Sharon Warner, female   DOB: 16-May-1951, 65 y.o.   MRN: [id number]

## 2016-05-30 NOTE — Progress Notes (Signed)
Patient ID: Sharon Warner, female   DOB: 11-23-1950, 65 y.o.   MRN: [id number]   Location:   [facility] Room Number: 151-C Place of Service:  SNF (31)   CODE STATUS: DNR  Allergies  Allergen Reactions  . Levaquin [Levofloxacin In D5w] Other (See Comments)    Unknown childhood allergy  . Penicillins Other (See Comments)    Unknown childhood allergy    Chief Complaint  Patient presents with  . Medical Management of Chronic Issues    Follow up    HPI:  She is a long term resident of this facility being seen for the management of her chronic illnesses. Overall her status is without change. She continues to be followed by wound doctor for her sacral wound. She is not voicing any complaints. There are no nursing concerns at this time.    Past Medical History:  Diagnosis Date  . Chronic heart failure ([place])   . Chronic indwelling Foley catheter    last changed few days ago  . Diabetes mellitus without complication ([place])   . H/O paraplegia    ascending paralysis unclear etiology  . Hypertension   . Lumbar spondylosis   . Morbid obesity ([place])   . OSA on CPAP     Past Surgical History:  Procedure Laterality Date  . JOINT REPLACEMENT Right    Hip  . TONSILLECTOMY    . VAGINAL HYSTERECTOMY      Social History   Social History  . Marital status: Married    Spouse name: N/A  . Number of children: N/A  . Years of education: N/A   Occupational History  . Not on file.   Social History Main Topics  . Smoking status: Never Smoker  . Smokeless tobacco: Not on file  . Alcohol use No  . Drug use: No  . Sexual activity: Not on file   Other Topics Concern  . Not on file   Social History Narrative   Patient just moved to [place] from [place]. Does have a cat at home. Brother and son live locally.   Family History  Problem Relation Age of Onset  . Hypothyroidism Mother   . Diabetes Maternal Grandmother   . Heart disease Maternal Grandfather        VITAL SIGNS BP 127/60   Pulse 71   Temp (!) 96.4 F (35.8 C) (Oral)   Resp 18   Ht 5\' 5"  (1.651 m)   Wt 221 lb (100.2 kg)   SpO2 97%   BMI 36.78 kg/m   Patient's Medications  New Prescriptions   No medications on file  Previous Medications   ASCORBIC ACID (VITAMIN C) 500 MG TABLET    Take 500 mg by mouth 2 (two) times daily.   ASPIRIN EC 81 MG TABLET    Take 81 mg by mouth daily.   B COMPLEX-C (B-COMPLEX WITH VITAMIN C) TABLET    Take 1 tablet by mouth daily.   CHOLECALCIFEROL (VITAMIN D) 1000 UNITS TABLET    Take 1,000 Units by mouth daily.   DOCUSATE SODIUM (COLACE) 100 MG CAPSULE    Take 100 mg by mouth 2 (two) times daily.   FAMOTIDINE (PEPCID) 20 MG TABLET    Take 20 mg by mouth 2 (two) times daily.   HEPARIN 5000 UNIT/ML INJECTION    Inject 5,000 Units into the skin every 8 (eight) hours.   INSULIN GLARGINE (LANTUS) 100 UNIT/ML INJECTION    Inject 50 Units into the  skin at bedtime.   INSULIN LISPRO (HUMALOG) 100 UNIT/ML INJECTION    Inject into the skin. Inject as per sliding scale: if  0-149 =0 units; 150- 500 = 5 units, subcutaneously before meals related to DM.   LACTOBACILLUS TABS    250 mg by mouth bid   METOPROLOL SUCCINATE (TOPROL-XL) 100 MG 24 HR TABLET    Take 1 tablet (100 mg total) by mouth daily. Take with or immediately following a meal.   NYSTATIN (NYSTATIN) POWDER    Apply topically daily.   POLYETHYLENE GLYCOL (MIRALAX / GLYCOLAX) PACKET    Take 17 g by mouth daily.   POTASSIUM CHLORIDE (K-DUR) 10 MEQ TABLET    Take 10 mEq by mouth once.    SENNA (SENOKOT) 8.6 MG TABS TABLET    Take 1 tablet by mouth at bedtime.   SODIUM CHLORIDE 0.9 % INJECTION    Inject 10 mLs into the vein.   TORSEMIDE (DEMADEX) 10 MG TABLET    Take 10 mg by mouth daily.   VANCOMYCIN HCL IV    Inject into the vein. Use 1 gram IV BID   ZINC SULFATE 220 (50 ZN) MG CAPSULE    Take 220 mg by mouth daily.  Modified Medications   No medications on file  Discontinued Medications    CEFTRIAXONE 2 G IN DEXTROSE 5 % 50 ML    Inject 2 g into the vein daily.   FUROSEMIDE (LASIX) 20 MG TABLET    Take 20 mg by mouth daily.   METRONIDAZOLE (FLAGYL) 500 MG TABLET    Take 1 tablet (500 mg total) by mouth every 6 (six) hours.     SIGNIFICANT DIAGNOSTIC EXAMS  03-14-16:  Ct angio of chest: Limited evaluation for pulmonary emboli due to motion artifact and poor opacification of the pulmonary arteries. No evidence for a large central pulmonary embolism but poor characterization of the pulmonary arteries beyond the main pulmonary arteries. Enlarged main pulmonary artery could represent underlying pulmonary hypertension. Enlargement of the ascending thoracic aorta measuring up to 4.2 cm.  Scattered ground-glass densities in lungs likely related to atelectasis. Probable hepatomegaly. Thyroid nodules. Recommend further characterization with thyroid ultrasound.  03-25-26: mri of sacrum: Large sacral decubitus ulcer extends to bone. Low level edema is seen in the distal sacrum and coccyx is compatible with osteomyelitis. Negative for abscess. Thickened appearance of the walls of the rectosigmoid colon worrisome for proctitis.  04-16-16: thyroid ultrasound; bilateral heterogenous thyroid lobes. Focal irregular mass right mid thyroid. 1.4 x 1.6 x 1.2 cm    LABS REVIEWED;   03-14-16: tsh 1.785; free T4; 1.23; hgb a1c 8.4 03-22-16: wbc 11.8; hgb 10.5; hct34.1; mcv 87.5; plt 328; glucose 129; bun 11; creat 0.88; k+ 3.3; na++ 140; ca++ 9.8  liver normal albumin 2.4 sed rate 66; CRP 2.7 04-03-16 wbc 12.9; hgb 13.2; hct 42.8; mcv 89.;5 plt 311; glucose 152; bun 34 ;creat 0.87; k+ 4.0; na++ 139;ca++ 11.2 phos 3.2; mag 2.0; albumin 3.2 sed rate 60; CRP 2.1 04-09-16: wbc 12.7; hgb 13.7; hct 44.0; mcv 88.4; plt 242; glucose 168; bun 20; creat 0.71; k+ 4.8; na++ 135; ca++ 10.9 mag 2.1; phos 3.2  04-17-16; wbc 9.4; hgb 13.5; hct 41.0; mcv 84.5; plt 241; glucose 153; bun 14; creat 0.66; k+ 3.6; na++ 139; ca++  10.6; sed rate 42  05-24-16: glucose 120; bun 22; creat 0.8; k+ 4.0; na++ 136; ca++ 11.1      Review of Systems  Constitutional: Negative for malaise/fatigue.  Respiratory:  Negative for cough and shortness of breath.   Cardiovascular: Negative for chest pain, palpitations and leg swelling.  Gastrointestinal: Negative for heartburn, abdominal pain and constipation.  Musculoskeletal: Negative for myalgias, back pain and joint pain.       Has no feeling below waist   Skin:       Has large sacral wound   Neurological: Negative for dizziness.  Psychiatric/Behavioral: The patient is not nervous/anxious.       Physical Exam  Constitutional: She is oriented to person, place, and time. She appears well-developed and well-nourished. No distress.  Obese   Eyes: Conjunctivae are normal.  Neck: Neck supple. No JVD present.  Thyroid gland slightly enlarged diffusely   Cardiovascular: Normal rate, regular rhythm and intact distal pulses.   Respiratory: Effort normal and breath sounds normal. No respiratory distress. She has no wheezes.  GI: Soft. Bowel sounds are normal. She exhibits no distension. There is no tenderness.  Genitourinary:  Has foley   Musculoskeletal: She exhibits edema.  Is able to move upper extremities Has bilateral lower extremity paralysis  Has trace bilateral lower extremity edema   Lymphadenopathy:    She has no cervical adenopathy.  Neurological: She is alert and oriented to person, place, and time.  Skin: Skin is warm and dry. She is not diaphoretic.  Lower extremities discolored Sacral stage IV: 4 x 2.5 x 3 cm silver alginate  Psychiatric: She has a normal mood and affect.       ASSESSMENT/ PLAN:  1. OSA: will continue cpap nightly will monitor  2. Thoracic ascending  aneurysm:  4.2 cm will continue blood pressure control is on toprol xl 100 mg daily asa 81 mg daily   3. Chronic heart failure; will continue demadex 10 mg  daily with k+ 10 meq daily    4. Hypertension: will continue toprol xl 100 mg daily   5. Diabetes: hgb a1c is 8.4; will continue lantus 50 units nightly and will continue humalog 5 units for cbg >=150  6. Gerd: will continue pepcid 20 mg twice daily   7.  Constipation: will continue colace twice daily and senna daily   8. Paraplegia: is chronic; has had for 4 years; is wheelchair bound;   9. Neurogenic bladder: has chronic foley  10. Sacral wound stage IV: will continue current treatment is followed by wound doctor and will monitor her status.   11. Thyroid nodule: tsh is 1.785; will await endocrinology consult  12. Hypercalcemia:  Her calcium is 11.6 will monitor     Ok Edwards NP [facility]  Contact [phone] Monday through Friday 8am- 5pm  After hours call [phone]

## 2016-06-21 LAB — CBC AND DIFFERENTIAL
HCT: 28 % — AB (ref 36–46)
HEMOGLOBIN: 8.5 g/dL — AB (ref 12.0–16.0)
Platelets: 486 10*3/uL — AB (ref 150–399)
WBC: 10.7 10*3/mL

## 2016-06-21 LAB — BASIC METABOLIC PANEL
BUN: 30 mg/dL — AB (ref 4–21)
CREATININE: 2 mg/dL — AB (ref 0.5–1.1)
Glucose: 186 mg/dL
POTASSIUM: 4.3 mmol/L (ref 3.4–5.3)
Sodium: 138 mmol/L (ref 137–147)

## 2016-06-22 LAB — BASIC METABOLIC PANEL
BUN: 22 mg/dL — AB (ref 4–21)
Creatinine: 0.7 mg/dL (ref 0.5–1.1)
GLUCOSE: 146 mg/dL
Potassium: 3.7 mmol/L (ref 3.4–5.3)
SODIUM: 136 mmol/L — AB (ref 137–147)

## 2016-06-27 ENCOUNTER — Non-Acute Institutional Stay (SKILLED_NURSING_FACILITY): Payer: BLUE CROSS/BLUE SHIELD | Admitting: Adult Health

## 2016-06-27 DIAGNOSIS — E042 Nontoxic multinodular goiter: Secondary | ICD-10-CM | POA: Diagnosis not present

## 2016-06-27 DIAGNOSIS — I5032 Chronic diastolic (congestive) heart failure: Secondary | ICD-10-CM

## 2016-06-27 DIAGNOSIS — L8994 Pressure ulcer of unspecified site, stage 4: Secondary | ICD-10-CM

## 2016-06-27 DIAGNOSIS — L089 Local infection of the skin and subcutaneous tissue, unspecified: Secondary | ICD-10-CM

## 2016-06-27 DIAGNOSIS — N319 Neuromuscular dysfunction of bladder, unspecified: Secondary | ICD-10-CM

## 2016-06-27 DIAGNOSIS — Z794 Long term (current) use of insulin: Secondary | ICD-10-CM

## 2016-06-27 DIAGNOSIS — I10 Essential (primary) hypertension: Secondary | ICD-10-CM | POA: Diagnosis not present

## 2016-06-27 DIAGNOSIS — G4733 Obstructive sleep apnea (adult) (pediatric): Secondary | ICD-10-CM

## 2016-06-27 DIAGNOSIS — G8220 Paraplegia, unspecified: Secondary | ICD-10-CM

## 2016-06-27 DIAGNOSIS — E11622 Type 2 diabetes mellitus with other skin ulcer: Secondary | ICD-10-CM | POA: Diagnosis not present

## 2016-06-27 NOTE — Progress Notes (Signed)
Patient ID: Sharon Warner, female   DOB: 28-Jul-1951, 65 y.o.   MRN: [id number]   Location:   [facility] Room Number: 151-C Place of Service:  SNF (31)   CODE STATUS: DNR  Allergies  Allergen Reactions  . Levaquin [Levofloxacin In D5w] Other (See Comments)    Unknown childhood allergy  . Penicillins Other (See Comments)    Unknown childhood allergy    Chief Complaint  Patient presents with  . Medical Management of Chronic Issues    Follow up    HPI:  She is a long term resident of this facility being seen for the management of her chronic illnesses. Overall her status is stable. She is not voicing any complaints today. There are no nursing concerns at this time.   Past Medical History:  Diagnosis Date  . Chronic heart failure ([place])   . Chronic indwelling Foley catheter    last changed few days ago  . Diabetes mellitus without complication ([place])   . H/O paraplegia    ascending paralysis unclear etiology  . Hypertension   . Lumbar spondylosis   . Morbid obesity ([place])   . OSA on CPAP     Past Surgical History:  Procedure Laterality Date  . JOINT REPLACEMENT Right    Hip  . TONSILLECTOMY    . VAGINAL HYSTERECTOMY      Social History   Social History  . Marital status: Married    Spouse name: N/A  . Number of children: N/A  . Years of education: N/A   Occupational History  . Not on file.   Social History Main Topics  . Smoking status: Never Smoker  . Smokeless tobacco: Not on file  . Alcohol use No  . Drug use: No  . Sexual activity: Not on file   Other Topics Concern  . Not on file   Social History Narrative   Patient just moved to [place] from [place]. Does have a cat at home. Brother and son live locally.   Family History  Problem Relation Age of Onset  . Hypothyroidism Mother   . Diabetes Maternal Grandmother   . Heart disease Maternal Grandfather       VITAL SIGNS BP 124/61   Pulse 74   Temp 98.7 F (37.1 C)  (Oral)   Resp 17   Ht 5\' 5"  (1.651 m)   Wt 226 lb 1 oz (102.5 kg)   SpO2 97%   BMI 37.62 kg/m   Patient's Medications  New Prescriptions   No medications on file  Previous Medications   ASCORBIC ACID (VITAMIN C) 500 MG TABLET    Take 500 mg by mouth 2 (two) times daily.   ASPIRIN EC 81 MG TABLET    Take 81 mg by mouth daily.   B COMPLEX-C (B-COMPLEX WITH VITAMIN C) TABLET    Take 1 tablet by mouth daily.   CHOLECALCIFEROL (VITAMIN D) 1000 UNITS TABLET    Take 1,000 Units by mouth daily.   DOCUSATE SODIUM (COLACE) 100 MG CAPSULE    Take 100 mg by mouth 2 (two) times daily.   FAMOTIDINE (PEPCID) 20 MG TABLET    Take 20 mg by mouth 2 (two) times daily.   HEPARIN 5000 UNIT/ML INJECTION    Inject 5,000 Units into the skin every 8 (eight) hours. Use 5 ml IV every 12 hours for PICC patency flush   INSULIN GLARGINE (LANTUS) 100 UNIT/ML INJECTION    Inject 50 Units into the skin  at bedtime.   INSULIN LISPRO (HUMALOG) 100 UNIT/ML INJECTION    Inject into the skin. Inject as per sliding scale: if  0-149 =0 units; 150- 500 = 5 units, subcutaneously before meals related to DM.   LACTOBACILLUS TABS    250 mg by mouth bid   MELATONIN 3 MG TABS    Take 1 tablet by mouth at bedtime.   METOPROLOL SUCCINATE (TOPROL-XL) 100 MG 24 HR TABLET    Take 1 tablet (100 mg total) by mouth daily. Take with or immediately following a meal.   NYSTATIN (NYSTATIN) POWDER    Apply topically daily.   POLYETHYLENE GLYCOL (MIRALAX / GLYCOLAX) PACKET    Take 17 g by mouth daily.   POTASSIUM CHLORIDE (K-DUR) 10 MEQ TABLET    Take 10 mEq by mouth once.    SENNA (SENOKOT) 8.6 MG TABS TABLET    Take 1 tablet by mouth at bedtime.   SERTRALINE (ZOLOFT) 50 MG TABLET    Take 50 mg by mouth daily.   SODIUM CHLORIDE 0.9 % INJECTION    Inject 10 mLs into the vein.   SODIUM CHLORIDE FLUSH (SALINE FLUSH IV)    Inject into the vein. Use 10 ml IV every 12 hours for PICC admin. Use 42ml IV every shift for PICC patency   TORSEMIDE (DEMADEX)  10 MG TABLET    Take 10 mg by mouth daily.   VANCOMYCIN 750 MG IN SODIUM CHLORIDE 0.9 % 150 ML    Inject 750 mg into the vein every 12 (twelve) hours.   ZINC SULFATE 220 (50 ZN) MG CAPSULE    Take 220 mg by mouth daily.  Modified Medications   No medications on file  Discontinued Medications   No medications on file     SIGNIFICANT DIAGNOSTIC EXAMS  03-14-16:  Ct angio of chest: Limited evaluation for pulmonary emboli due to motion artifact and poor opacification of the pulmonary arteries. No evidence for a large central pulmonary embolism but poor characterization of the pulmonary arteries beyond the main pulmonary arteries. Enlarged main pulmonary artery could represent underlying pulmonary hypertension. Enlargement of the ascending thoracic aorta measuring up to 4.2 cm.  Scattered ground-glass densities in lungs likely related to atelectasis. Probable hepatomegaly. Thyroid nodules. Recommend further characterization with thyroid ultrasound.  03-25-26: mri of sacrum: Large sacral decubitus ulcer extends to bone. Low level edema is seen in the distal sacrum and coccyx is compatible with osteomyelitis. Negative for abscess. Thickened appearance of the walls of the rectosigmoid colon worrisome for proctitis.  04-16-16: thyroid ultrasound; bilateral heterogenous thyroid lobes. Focal irregular mass right mid thyroid. 1.4 x 1.6 x 1.2 cm    LABS REVIEWED;   03-14-16: tsh 1.785; free T4; 1.23; hgb a1c 8.4 03-22-16: wbc 11.8; hgb 10.5; hct34.1; mcv 87.5; plt 328; glucose 129; bun 11; creat 0.88; k+ 3.3; na++ 140; ca++ 9.8  liver normal albumin 2.4 sed rate 66; CRP 2.7 04-03-16 wbc 12.9; hgb 13.2; hct 42.8; mcv 89.;5 plt 311; glucose 152; bun 34 ;creat 0.87; k+ 4.0; na++ 139;ca++ 11.2 phos 3.2; mag 2.0; albumin 3.2 sed rate 60; CRP 2.1 04-09-16: wbc 12.7; hgb 13.7; hct 44.0; mcv 88.4; plt 242; glucose 168; bun 20; creat 0.71; k+ 4.8; na++ 135; ca++ 10.9 mag 2.1; phos 3.2  04-17-16; wbc 9.4; hgb 13.5;  hct 41.0; mcv 84.5; plt 241; glucose 153; bun 14; creat 0.66; k+ 3.6; na++ 139; ca++ 10.6; sed rate 42  04-19-16: tsh 1.68; free T3: 1.8 05-24-16: glucose 120;  bun 22; creat 0.8; k+ 4.0; na++ 136; ca++ 11.1  06-19-16: glucose 106; bun 21; creat 0.61; k+ 3.9; na++ 136      Review of Systems  Constitutional: Negative for malaise/fatigue.  Respiratory: Negative for cough and shortness of breath.   Cardiovascular: Negative for chest pain, palpitations and leg swelling.  Gastrointestinal: Negative for heartburn, abdominal pain and constipation.  Musculoskeletal: Negative for myalgias, back pain and joint pain.       Has no feeling below waist   Skin:       Has  sacral wound   Neurological: Negative for dizziness.  Psychiatric/Behavioral: The patient is not nervous/anxious.       Physical Exam  Constitutional: She is oriented to person, place, and time. She appears well-developed and well-nourished. No distress.  Obese   Eyes: Conjunctivae are normal.  Neck: Neck supple. No JVD present.  Thyroid gland slightly enlarged diffusely   Cardiovascular: Normal rate, regular rhythm and intact distal pulses.   Respiratory: Effort normal and breath sounds normal. No respiratory distress. She has no wheezes.  GI: Soft. Bowel sounds are normal. She exhibits no distension. There is no tenderness.  Genitourinary:  Has foley   Musculoskeletal: She exhibits edema.  Is able to move upper extremities Has bilateral lower extremity paralysis  Has trace bilateral lower extremity edema   Lymphadenopathy:    She has no cervical adenopathy.  Neurological: She is alert and oriented to person, place, and time.  Skin: Skin is warm and dry. She is not diaphoretic.  Lower extremities discolored Sacral stage IV: 4 x 2.5 x 3 cm silver alginate  Psychiatric: She has a normal mood and affect.       ASSESSMENT/ PLAN:  1. OSA: will continue cpap nightly will monitor  2. Thoracic ascending  aneurysm:  4.2 cm  will continue blood pressure control is on toprol xl 100 mg daily asa 81 mg daily   3. Chronic heart failure; will continue demadex 10 mg  daily with k+ 10 meq daily   4. Hypertension: will continue toprol xl 100 mg daily   5. Diabetes: hgb a1c is 8.4; will continue lantus 50 units nightly and will continue humalog 5 units for cbg >=150  6. Gerd: will continue pepcid 20 mg twice daily   7.  Constipation: will continue colace twice daily and senna daily   8. Paraplegia: is chronic; has had for 4 years; is wheelchair bound;   9. Neurogenic bladder: has chronic foley  10. Sacral wound stage IV: will continue current treatment is followed by wound doctor and will monitor her status.  Will continue IV vancomycin   11. Thyroid nodule: tsh is 1.68; will await endocrinology consult  12. Hypercalcemia:  Her calcium is 11.1 will monitor            MD is aware of resident's narcotic use and is in agreement with current plan of care. We will attempt to wean resident as apropriate   Ok Edwards NP [facility]  Contact [phone] Monday through Friday 8am- 5pm  After hours call [phone]

## 2016-06-28 LAB — BASIC METABOLIC PANEL
BUN: 24 mg/dL — AB (ref 4–21)
Creatinine: 0.7 mg/dL (ref 0.5–1.1)
Glucose: 127 mg/dL
Potassium: 3.8 mmol/L (ref 3.4–5.3)
SODIUM: 139 mmol/L (ref 137–147)

## 2016-07-08 DIAGNOSIS — I5032 Chronic diastolic (congestive) heart failure: Secondary | ICD-10-CM | POA: Insufficient documentation

## 2016-07-23 ENCOUNTER — Encounter: Payer: Self-pay | Admitting: Infectious Diseases

## 2016-07-23 ENCOUNTER — Ambulatory Visit (INDEPENDENT_AMBULATORY_CARE_PROVIDER_SITE_OTHER): Payer: BLUE CROSS/BLUE SHIELD | Admitting: Infectious Diseases

## 2016-07-23 DIAGNOSIS — Z794 Long term (current) use of insulin: Secondary | ICD-10-CM

## 2016-07-23 DIAGNOSIS — E11622 Type 2 diabetes mellitus with other skin ulcer: Secondary | ICD-10-CM | POA: Diagnosis not present

## 2016-07-23 DIAGNOSIS — M4628 Osteomyelitis of vertebra, sacral and sacrococcygeal region: Secondary | ICD-10-CM

## 2016-07-23 NOTE — Assessment & Plan Note (Signed)
Will ask that she has podiatry eval.

## 2016-07-23 NOTE — Assessment & Plan Note (Signed)
Will continue her off antibiotics.  She needs 1) good wound care 2) nutrition 3) off loading- air mattress, freq turning (at least every 2 h) Can f/u with ID prn

## 2016-07-23 NOTE — Progress Notes (Signed)
   Subjective:    Patient ID: Sharon Warner, female    DOB: December 21, 1950, 65 y.o.   MRN: [id number]  HPI  65 y.o. female who developed progressive lower extremity weakness starting in 2014. She became paraplegic. She was living in [place] at that time and she saw multiple specialists but no clear etiology was found. She has been bed and wheelchair-bound since that time. She has a neurogenic bladder with chronic indwelling Foley catheter. She states that she developed a sacral pressure sore about 6 months ago and that it has been getting progressively larger.  She and her family decided to move here to [place]. She was hoping to find new doctors who could help her. By June 2017 she became weak and was adm with sepsis. She had Bcx which grew group F strep, Clostridium Romsum, E coli and diphtheroids. Her wound Cx grew P mirabilis.  She was treated with ceftriaxone and flagyl and was to get 6 weeks of anbx. She was d/c to [facility]. Her stop date was listed as 7-25.  She returns to ID clinic today stating that her wound is healing.  She had wound VAC placed ~ 1 month ago.  She was then treated with IV vanco (not clear reasoning in the notes in EPIC). She states this has since been stopped.  States she is on no anbx now.  She is now at [facility]. Plan for d/c in November depending on wound progress.  Has wound care there. Not clear if nutrition follows her.  Has air flow mattress. States nurses turn her every 2h.  Her last A1C in chart was 8.4.   The past medical history, family history and social history were reviewed/updated in EPIC   Review of Systems  Constitutional: Negative for chills and fever.  Gastrointestinal: Positive for constipation. Negative for diarrhea.  Genitourinary: Negative for difficulty urinating.  still has foley.      Objective:   Physical Exam  Constitutional: She appears well-developed and well-nourished.  HENT:  Mouth/Throat: No oropharyngeal exudate.  Eyes:  EOM are normal. Pupils are equal, round, and reactive to light.  Neck: Neck supple.  Cardiovascular: Normal rate, regular rhythm and normal heart sounds.   Pulmonary/Chest: Effort normal and breath sounds normal.  Abdominal: Soft. Bowel sounds are normal. There is no tenderness. There is no rebound.  Lymphadenopathy:    She has no cervical adenopathy.  Skin:         Assessment & Plan:

## 2016-07-24 ENCOUNTER — Inpatient Hospital Stay: Admitting: Infectious Diseases

## 2016-07-29 ENCOUNTER — Non-Acute Institutional Stay (SKILLED_NURSING_FACILITY): Payer: BLUE CROSS/BLUE SHIELD | Admitting: Adult Health

## 2016-07-29 ENCOUNTER — Encounter: Payer: Self-pay | Admitting: Adult Health

## 2016-07-29 DIAGNOSIS — E11622 Type 2 diabetes mellitus with other skin ulcer: Secondary | ICD-10-CM | POA: Diagnosis not present

## 2016-07-29 DIAGNOSIS — Z794 Long term (current) use of insulin: Secondary | ICD-10-CM

## 2016-07-29 DIAGNOSIS — I5032 Chronic diastolic (congestive) heart failure: Secondary | ICD-10-CM | POA: Diagnosis not present

## 2016-07-29 DIAGNOSIS — E662 Morbid (severe) obesity with alveolar hypoventilation: Secondary | ICD-10-CM

## 2016-07-29 DIAGNOSIS — G4733 Obstructive sleep apnea (adult) (pediatric): Secondary | ICD-10-CM | POA: Diagnosis not present

## 2016-07-29 DIAGNOSIS — E042 Nontoxic multinodular goiter: Secondary | ICD-10-CM | POA: Diagnosis not present

## 2016-07-29 DIAGNOSIS — I10 Essential (primary) hypertension: Secondary | ICD-10-CM | POA: Diagnosis not present

## 2016-07-29 NOTE — Progress Notes (Signed)
Patient ID: Sharon Warner, female   DOB: Feb 05, 1951, 65 y.o.   MRN: [id number]    Location:   [facility] Room Number: 151-C Place of Service:  SNF (31)   CODE STATUS: DNR  Allergies  Allergen Reactions  . Levaquin [Levofloxacin In D5w] Other (See Comments)    Unknown childhood allergy  . Penicillins Other (See Comments)    Unknown childhood allergy    Chief Complaint  Patient presents with  . Medical Management of Chronic Issues    Follow up    HPI:  She is a long term resident of this facility being seen for the management of her chronic illnesses. She does have morbid obesity and congestive heart failure. She does have obesity hypoventilation syndrome with chronic respiratory failure. She is not voicing any complaints. She does tell me that she will be going home in the next several weeks. There are no nursing concerns at this time.    Past Medical History:  Diagnosis Date  . Chronic heart failure ([place])   . Chronic indwelling Foley catheter    last changed few days ago  . Diabetes mellitus without complication ([place])   . H/O paraplegia    ascending paralysis unclear etiology  . Hypertension   . Lumbar spondylosis   . Morbid obesity ([place])   . OSA on CPAP   . Sacral decubitus ulcer, stage IV [facility])     Past Surgical History:  Procedure Laterality Date  . JOINT REPLACEMENT Right    Hip  . TONSILLECTOMY    . VAGINAL HYSTERECTOMY      Social History   Social History  . Marital status: Married    Spouse name: N/A  . Number of children: N/A  . Years of education: N/A   Occupational History  . Not on file.   Social History Main Topics  . Smoking status: Never Smoker  . Smokeless tobacco: Never Used  . Alcohol use No  . Drug use: No  . Sexual activity: Not on file   Other Topics Concern  . Not on file   Social History Narrative   Patient just moved to [place] from [place]. Does have a cat at home. Brother and son live locally.    Family History  Problem Relation Age of Onset  . Hypothyroidism Mother   . Diabetes Maternal Grandmother   . Heart disease Maternal Grandfather       VITAL SIGNS BP 127/63   Pulse 66   Temp 97.5 F (36.4 C) (Oral)   Resp 18   Ht 5\' 5"  (1.651 m)   Wt 217 lb (98.4 kg)   SpO2 97%   BMI 36.11 kg/m   Patient's Medications  New Prescriptions   No medications on file  Previous Medications   ASCORBIC ACID (VITAMIN C) 500 MG TABLET    Take 500 mg by mouth 2 (two) times daily.   ASPIRIN EC 81 MG TABLET    Take 81 mg by mouth daily.   B COMPLEX-C (B-COMPLEX WITH VITAMIN C) TABLET    Take 1 tablet by mouth daily.   CHOLECALCIFEROL (VITAMIN D) 1000 UNITS TABLET    Take 1,000 Units by mouth daily.   DOCUSATE SODIUM (COLACE) 100 MG CAPSULE    Take 100 mg by mouth 2 (two) times daily.   FAMOTIDINE (PEPCID) 20 MG TABLET    Take 20 mg by mouth 2 (two) times daily.   INSULIN GLARGINE (LANTUS) 100 UNIT/ML INJECTION  Inject 50 Units into the skin at bedtime.   INSULIN LISPRO (HUMALOG) 100 UNIT/ML INJECTION    Inject into the skin. Inject as per sliding scale: if  0-149 =0 units; 150- 500 = 5 units, subcutaneously before meals related to DM.   KETOCONAZOLE (NIZORAL) 2 % SHAMPOO    Apply 1 application topically 2 (two) times a week.   LACTOBACILLUS TABS    250 mg by mouth bid   MELATONIN 3 MG TABS    Take 1 tablet by mouth at bedtime.   METOPROLOL SUCCINATE (TOPROL-XL) 100 MG 24 HR TABLET    Take 1 tablet (100 mg total) by mouth daily. Take with or immediately following a meal.   POLYETHYLENE GLYCOL (MIRALAX / GLYCOLAX) PACKET    Take 17 g by mouth daily.   POTASSIUM CHLORIDE (K-DUR) 10 MEQ TABLET    Take 10 mEq by mouth once.    SENNA (SENOKOT) 8.6 MG TABS TABLET    Take 1 tablet by mouth at bedtime.   SERTRALINE (ZOLOFT) 50 MG TABLET    Take 50 mg by mouth daily.   TORSEMIDE (DEMADEX) 10 MG TABLET    Take 10 mg by mouth daily.   ZINC SULFATE 220 (50 ZN) MG CAPSULE    Take 220 mg by mouth  daily.  Modified Medications   No medications on file  Discontinued Medications   HEPARIN 5000 UNIT/ML INJECTION    Inject 5,000 Units into the skin every 8 (eight) hours. Use 5 ml IV every 12 hours for PICC patency flush   NYSTATIN (NYSTATIN) POWDER    Apply topically daily.   SODIUM CHLORIDE 0.9 % INJECTION    Inject 10 mLs into the vein.   SODIUM CHLORIDE FLUSH (SALINE FLUSH IV)    Inject into the vein. Use 10 ml IV every 12 hours for PICC admin. Use 7ml IV every shift for PICC patency   VANCOMYCIN 750 MG IN SODIUM CHLORIDE 0.9 % 150 ML    Inject 750 mg into the vein every 12 (twelve) hours.     SIGNIFICANT DIAGNOSTIC EXAMS  03-14-16:  Ct angio of chest: Limited evaluation for pulmonary emboli due to motion artifact and poor opacification of the pulmonary arteries. No evidence for a large central pulmonary embolism but poor characterization of the pulmonary arteries beyond the main pulmonary arteries. Enlarged main pulmonary artery could represent underlying pulmonary hypertension. Enlargement of the ascending thoracic aorta measuring up to 4.2 cm.  Scattered ground-glass densities in lungs likely related to atelectasis. Probable hepatomegaly. Thyroid nodules. Recommend further characterization with thyroid ultrasound.  03-25-26: mri of sacrum: Large sacral decubitus ulcer extends to bone. Low level edema is seen in the distal sacrum and coccyx is compatible with osteomyelitis. Negative for abscess. Thickened appearance of the walls of the rectosigmoid colon worrisome for proctitis.  04-16-16: thyroid ultrasound; bilateral heterogenous thyroid lobes. Focal irregular mass right mid thyroid. 1.4 x 1.6 x 1.2 cm    LABS REVIEWED;   03-14-16: tsh 1.785; free T4; 1.23; hgb a1c 8.4 03-22-16: wbc 11.8; hgb 10.5; hct34.1; mcv 87.5; plt 328; glucose 129; bun 11; creat 0.88; k+ 3.3; na++ 140; ca++ 9.8  liver normal albumin 2.4 sed rate 66; CRP 2.7 04-03-16 wbc 12.9; hgb 13.2; hct 42.8; mcv 89.;5 plt  311; glucose 152; bun 34 ;creat 0.87; k+ 4.0; na++ 139;ca++ 11.2 phos 3.2; mag 2.0; albumin 3.2 sed rate 60; CRP 2.1 04-09-16: wbc 12.7; hgb 13.7; hct 44.0; mcv 88.4; plt 242; glucose 168; bun 20;  creat 0.71; k+ 4.8; na++ 135; ca++ 10.9 mag 2.1; phos 3.2  04-17-16; wbc 9.4; hgb 13.5; hct 41.0; mcv 84.5; plt 241; glucose 153; bun 14; creat 0.66; k+ 3.6; na++ 139; ca++ 10.6; sed rate 42  04-19-16: tsh 1.68; free T3: 1.8 05-24-16: glucose 120; bun 22; creat 0.8; k+ 4.0; na++ 136; ca++ 11.1  06-19-16: glucose 106; bun 21; creat 0.61; k+ 3.9; na++ 136  06-28-16: glucose 127; bun 24; creat 0.69; k+ 3.8; na++ 136; ca++ 10.8    Review of Systems  Constitutional: Negative for malaise/fatigue.  Respiratory: Negative for cough and shortness of breath.   Cardiovascular: Negative for chest pain, palpitations and leg swelling.  Gastrointestinal: Negative for heartburn, abdominal pain and constipation.  Musculoskeletal: Negative for myalgias, back pain and joint pain.       Has no feeling below waist   Skin:       Has  sacral wound   Neurological: Negative for dizziness.  Psychiatric/Behavioral: The patient is not nervous/anxious.       Physical Exam  Constitutional: She is oriented to person, place, and time. She appears well-developed and well-nourished. No distress.  Obese   Eyes: Conjunctivae are normal.  Neck: Neck supple. No JVD present.  Thyroid gland slightly enlarged diffusely   Cardiovascular: Normal rate, regular rhythm and intact distal pulses.   Respiratory: Effort normal and breath sounds normal. No respiratory distress. She has no wheezes.  GI: Soft. Bowel sounds are normal. She exhibits no distension. There is no tenderness.  Genitourinary:  Has foley   Musculoskeletal: She exhibits edema.  Is able to move upper extremities Has bilateral lower extremity paralysis  Has trace bilateral lower extremity edema   Lymphadenopathy:    She has no cervical adenopathy.  Neurological: She is  alert and oriented to person, place, and time.  Skin: Skin is warm and dry. She is not diaphoretic.  Lower extremities discolored Sacral stage IV: 4 x 2.5 x 3 cm silver alginate  Psychiatric: She has a normal mood and affect.       ASSESSMENT/ PLAN:  1. OSA: with hypoventilation syndrome and chronic respiratory failure; is on chronic 02 use. BMI 36.11   will continue cpap nightly will monitor  2. Thoracic ascending  aneurysm:  4.2 cm will continue blood pressure control is on toprol xl 100 mg daily asa 81 mg daily   3. Chronic heart failure; will continue demadex 10 mg  daily with k+ 10 meq daily   4. Hypertension: will continue toprol xl 100 mg daily   5. Diabetes: hgb a1c is 8.4; will continue lantus 50 units nightly and will continue humalog 5 units for cbg >=150  6. Gerd: will continue pepcid 20 mg twice daily   7.  Constipation: will continue colace twice daily and senna daily   8. Paraplegia: is chronic; has had for 4 years; is wheelchair bound;   9. Neurogenic bladder: has chronic foley  10. Sacral wound stage IV: will continue current treatment is followed by wound doctor and will monitor her status.    11. Thyroid nodule: tsh is 1.68; will await endocrinology consult  12. Hypercalcemia:  Her calcium is 10.8 will monitor    Will get a PTH and ionized calcium Will await her endocrinology for her thyroid nodule and for her hypercalcemia      Ok Edwards NP [facility]  Contact [phone] Monday through Friday 8am- 5pm  After hours call [phone]

## 2016-08-01 ENCOUNTER — Encounter: Payer: Self-pay | Admitting: Adult Health

## 2016-08-01 LAB — VANCOMYCIN, TROUGH
CALCIUM: 10.8 mg/dL
Calcium: 10.5 mg/dL
VANCOMYCIN TR: 16.5
Vancomycin Tr: 11.5

## 2016-08-01 LAB — CALCIUM, IONIZED: Calcium, Ion: 6.8

## 2016-08-01 LAB — VITAMIN D 25 HYDROXY (VIT D DEFICIENCY, FRACTURES): VIT D 25 HYDROXY: 20.65

## 2016-08-01 LAB — PARATHYROID HORMONE, INTACT (NO CA): PTH: 37.83 pg/mL

## 2016-08-08 LAB — CALCIUM, IONIZED: CALCIUM ION: 6.8

## 2016-08-15 ENCOUNTER — Non-Acute Institutional Stay (SKILLED_NURSING_FACILITY): Payer: BLUE CROSS/BLUE SHIELD | Admitting: Adult Health

## 2016-08-15 ENCOUNTER — Encounter: Payer: Self-pay | Admitting: Adult Health

## 2016-08-15 DIAGNOSIS — E662 Morbid (severe) obesity with alveolar hypoventilation: Secondary | ICD-10-CM

## 2016-08-15 DIAGNOSIS — L89154 Pressure ulcer of sacral region, stage 4: Secondary | ICD-10-CM | POA: Insufficient documentation

## 2016-08-15 DIAGNOSIS — G8220 Paraplegia, unspecified: Secondary | ICD-10-CM | POA: Diagnosis not present

## 2016-08-15 DIAGNOSIS — G4733 Obstructive sleep apnea (adult) (pediatric): Secondary | ICD-10-CM

## 2016-08-15 NOTE — Progress Notes (Signed)
Patient ID: Sharon Warner, female   DOB: 05-Aug-1951, 65 y.o.   MRN: [id number]   Location:   [place] Room Number: 151-C Place of Service:  SNF (31)    CODE STATUS: DNR  Allergies  Allergen Reactions  . Levaquin [Levofloxacin In D5w] Other (See Comments)    Unknown childhood allergy  . Penicillins Other (See Comments)    Unknown childhood allergy    Chief Complaint  Patient presents with  . Discharge Note    Discharge from faciity    HPI:  She is being discharge to home with home health for pt/ot/rn. She will need a wound vac; hospital bed and will need non-invasive ventilation. She will need her prescriptions written and will need to follow up with her medical provider.     Past Medical History:  Diagnosis Date  . Chronic heart failure ([place])   . Chronic indwelling Foley catheter    last changed few days ago  . Diabetes mellitus without complication ([place])   . Enlarged thoracic aorta ([place]) 03/14/2016  . H/O paraplegia    ascending paralysis unclear etiology  . Hypertension   . Lumbar spondylosis   . Morbid obesity ([place])   . OSA on CPAP   . Sacral decubitus ulcer, stage IV [facility])     Past Surgical History:  Procedure Laterality Date  . JOINT REPLACEMENT Right    Hip  . TONSILLECTOMY    . VAGINAL HYSTERECTOMY      Social History   Social History  . Marital status: Married    Spouse name: N/A  . Number of children: N/A  . Years of education: N/A   Occupational History  . Not on file.   Social History Main Topics  . Smoking status: Never Smoker  . Smokeless tobacco: Never Used  . Alcohol use No  . Drug use: No  . Sexual activity: Not on file   Other Topics Concern  . Not on file   Social History Narrative   Patient just moved to [place] from [place]. Does have a cat at home. Brother and son live locally.   Family History  Problem Relation Age of Onset  . Hypothyroidism Mother   . Diabetes Maternal Grandmother   . Heart  disease Maternal Grandfather     VITAL SIGNS BP 114/61   Pulse 64   Temp 97.1 F (36.2 C) (Oral)   Resp 16   Ht 5\' 5"  (1.651 m)   Wt 199 lb 6 oz (90.4 kg)   SpO2 97%   BMI 33.18 kg/m   Patient's Medications  New Prescriptions   No medications on file  Previous Medications   ASCORBIC ACID (VITAMIN C) 500 MG TABLET    Take 500 mg by mouth 2 (two) times daily.   ASPIRIN EC 81 MG TABLET    Take 81 mg by mouth daily.   B COMPLEX-C (B-COMPLEX WITH VITAMIN C) TABLET    Take 1 tablet by mouth daily.   CHOLECALCIFEROL (VITAMIN D) 1000 UNITS TABLET    Take 1,000 Units by mouth daily.   DOCUSATE SODIUM (COLACE) 100 MG CAPSULE    Take 100 mg by mouth 2 (two) times daily.   FAMOTIDINE (PEPCID) 20 MG TABLET    Take 20 mg by mouth 2 (two) times daily.   INSULIN GLARGINE (LANTUS) 100 UNIT/ML INJECTION    Inject 50 Units into the skin at bedtime.   INSULIN LISPRO (HUMALOG) 100 UNIT/ML INJECTION    Inject  into the skin. Inject as per sliding scale: if  0-149 =0 units; 150- 500 = 5 units, subcutaneously before meals related to DM.   KETOCONAZOLE (NIZORAL) 2 % SHAMPOO    Apply 1 application topically 2 (two) times a week.   LACTOBACILLUS TABS    250 mg by mouth bid   MELATONIN 3 MG TABS    Take 1 tablet by mouth at bedtime.   METOPROLOL SUCCINATE (TOPROL-XL) 100 MG 24 HR TABLET    Take 1 tablet (100 mg total) by mouth daily. Take with or immediately following a meal.   POLYETHYLENE GLYCOL (MIRALAX / GLYCOLAX) PACKET    Take 17 g by mouth daily.   POTASSIUM CHLORIDE (K-DUR) 10 MEQ TABLET    Take 10 mEq by mouth once.    SENNA (SENOKOT) 8.6 MG TABS TABLET    Take 1 tablet by mouth at bedtime.   SERTRALINE (ZOLOFT) 50 MG TABLET    Take 50 mg by mouth daily.   TORSEMIDE (DEMADEX) 10 MG TABLET    Take 10 mg by mouth daily.   ZINC SULFATE 220 (50 ZN) MG CAPSULE    Take 220 mg by mouth daily.  Modified Medications   No medications on file  Discontinued Medications   No medications on file      SIGNIFICANT DIAGNOSTIC EXAMS  03-14-16:  Ct angio of chest: Limited evaluation for pulmonary emboli due to motion artifact and poor opacification of the pulmonary arteries. No evidence for a large central pulmonary embolism but poor characterization of the pulmonary arteries beyond the main pulmonary arteries. Enlarged main pulmonary artery could represent underlying pulmonary hypertension. Enlargement of the ascending thoracic aorta measuring up to 4.2 cm.  Scattered ground-glass densities in lungs likely related to atelectasis. Probable hepatomegaly. Thyroid nodules. Recommend further characterization with thyroid ultrasound.  03-25-26: mri of sacrum: Large sacral decubitus ulcer extends to bone. Low level edema is seen in the distal sacrum and coccyx is compatible with osteomyelitis. Negative for abscess. Thickened appearance of the walls of the rectosigmoid colon worrisome for proctitis.  04-16-16: thyroid ultrasound; bilateral heterogenous thyroid lobes. Focal irregular mass right mid thyroid. 1.4 x 1.6 x 1.2 cm    LABS REVIEWED;   03-14-16: tsh 1.785; free T4; 1.23; hgb a1c 8.4 03-22-16: wbc 11.8; hgb 10.5; hct34.1; mcv 87.5; plt 328; glucose 129; bun 11; creat 0.88; k+ 3.3; na++ 140; ca++ 9.8  liver normal albumin 2.4 sed rate 66; CRP 2.7 04-03-16 wbc 12.9; hgb 13.2; hct 42.8; mcv 89.;5 plt 311; glucose 152; bun 34 ;creat 0.87; k+ 4.0; na++ 139;ca++ 11.2 phos 3.2; mag 2.0; albumin 3.2 sed rate 60; CRP 2.1 04-09-16: wbc 12.7; hgb 13.7; hct 44.0; mcv 88.4; plt 242; glucose 168; bun 20; creat 0.71; k+ 4.8; na++ 135; ca++ 10.9 mag 2.1; phos 3.2  04-17-16; wbc 9.4; hgb 13.5; hct 41.0; mcv 84.5; plt 241; glucose 153; bun 14; creat 0.66; k+ 3.6; na++ 139; ca++ 10.6; sed rate 42  04-19-16: tsh 1.68; free T3: 1.8 05-24-16: glucose 120; bun 22; creat 0.8; k+ 4.0; na++ 136; ca++ 11.1  06-19-16: glucose 106; bun 21; creat 0.61; k+ 3.9; na++ 136  06-28-16: glucose 127; bun 24; creat 0.69; k+ 3.8; na++  136; ca++ 10.8    Review of Systems  Constitutional: Negative for malaise/fatigue.  Respiratory: Negative for cough and shortness of breath.   Cardiovascular: Negative for chest pain, palpitations and leg swelling.  Gastrointestinal: Negative for heartburn, abdominal pain and constipation.  Musculoskeletal: Negative for  myalgias, back pain and joint pain.       Has no feeling below waist   Skin:       Has  sacral wound   Neurological: Negative for dizziness.  Psychiatric/Behavioral: The patient is not nervous/anxious.       Physical Exam  Constitutional: She is oriented to person, place, and time. She appears well-developed and well-nourished. No distress.  Obese   Eyes: Conjunctivae are normal.  Neck: Neck supple. No JVD present.  Thyroid gland slightly enlarged diffusely   Cardiovascular: Normal rate, regular rhythm and intact distal pulses.   Respiratory: Effort normal and breath sounds normal. No respiratory distress. She has no wheezes.  GI: Soft. Bowel sounds are normal. She exhibits no distension. There is no tenderness.  Genitourinary:  Has foley   Musculoskeletal: She exhibits edema.  Is able to move upper extremities Has bilateral lower extremity paralysis  Has trace bilateral lower extremity edema   Lymphadenopathy:    She has no cervical adenopathy.  Neurological: She is alert and oriented to person, place, and time.  Skin: Skin is warm and dry. She is not diaphoretic.  Lower extremities discolored Sacral stage IV: 3.1 x 3.1 x 2.5 cm wound vac  Psychiatric: She has a normal mood and affect.       ASSESSMENT/ PLAN:  Patient is being discharged with the following home health services:  Pt/or/rn: to evaluate and treat as indicated for strength; adl training;medication management and wound management  Patient is being discharged with the following durable medical equipment:  She requires the use of an electric bed with an air loss mattress due to her stage IV  sacral wound and paraplegia; to allow for pressure relieving and frequent position changes which cannot be achieved in a standard bed. She will need non-invasive ventilation due to her obesity hypoventilation syndrome and chronic respiratory failure she requires use of 02 at 2/[place] via concentrator due to her chronic respiratory failure.   Patient has been advised to f/u with their PCP in 1-2 weeks to bring them up to date on their rehab stay.  Social services at facility was responsible for arranging this appointment.  Pt was provided with a 30 day supply of prescriptions for medications and refills must be obtained from their PCP.  For controlled substances, a more limited supply may be provided adequate until PCP appointment only.   Time spent with patient    45   minutes >50% time spent counseling; reviewing medical record; tests; labs; and developing future plan of care   Ok Edwards NP [facility]  Contact [phone] Monday through Friday 8am- 5pm  After hours call [phone]

## 2016-10-07 ENCOUNTER — Encounter: Payer: Self-pay | Admitting: Internal Medicine

## 2016-11-14 ENCOUNTER — Other Ambulatory Visit: Payer: Self-pay | Admitting: Internal Medicine

## 2016-12-25 ENCOUNTER — Telehealth: Payer: Self-pay | Admitting: Neurology

## 2016-12-25 NOTE — Telephone Encounter (Signed)
Pt's husband called said she been to 3 neurologist in [place]. Last neurologist was Rogelia Rohrer [phone] [address] [place] [address] [place] [address], saw pt 3/17. He is also handicapped and is not sure if he will be able to accompany her to appt 01/02/17. He said pt should sign medical release to request records from this neurologist at her appt. FYI

## 2016-12-25 NOTE — Telephone Encounter (Signed)
Noted, thank you

## 2017-01-02 ENCOUNTER — Telehealth: Payer: Self-pay | Admitting: Neurology

## 2017-01-02 ENCOUNTER — Encounter (INDEPENDENT_AMBULATORY_CARE_PROVIDER_SITE_OTHER): Payer: Self-pay

## 2017-01-02 ENCOUNTER — Encounter: Payer: Self-pay | Admitting: Neurology

## 2017-01-02 ENCOUNTER — Ambulatory Visit (INDEPENDENT_AMBULATORY_CARE_PROVIDER_SITE_OTHER): Payer: BLUE CROSS/BLUE SHIELD | Admitting: Neurology

## 2017-01-02 VITALS — BP 115/75 | HR 68 | Ht 65.0 in

## 2017-01-02 DIAGNOSIS — G8220 Paraplegia, unspecified: Secondary | ICD-10-CM

## 2017-01-02 NOTE — Telephone Encounter (Signed)
Pt husband calling to get clarity on what was suggested re: pt usage of a wheelchair, he is asking that we try both phone #'s his and pt's

## 2017-01-02 NOTE — Telephone Encounter (Signed)
Error

## 2017-01-02 NOTE — Progress Notes (Signed)
Reason for visit: Paraparesis  Referring physician: Dr. Sherron Flemings is a 66 y.o. female  History of present illness:  Ms. Sharon Warner is a 66 year old right-handed white female with a history of a relatively sudden onset paraplegia. The past medical records regarding this event are not available to me, but they have been requested. According to the patient and her husband, the onset of the deficit occurred in December 2015. The patient had received a flu shot 2 or 3 days prior to onset of symptoms. The patient began having a numbness sensation in the toes of the feet, and within 3 days she went from being ambulatory to paraplegic. The patient developed a sensory level at the T5 level, and she lost control of bowel and bladder function. The patient was admitted to the hospital, they claimed she underwent MRI evaluation of the spine, and underwent lumbar puncture. 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 The patient apparently has had completely normal studies of the neuro axis even one year following the onset of the deficit. The patient did undergo EMG nerve conduction study at some point around the time of the initial presentation. The result of this study is not known to me. The patient has had some tingling in the fingers, but this began much later after the onset of the above deficit. The patient does have a history of an ascending aortic aneurysm, the patient does not wish to consider surgery for this. The patient claims that there was no pain whatsoever at any time around the onset of the symptoms, and she does not have pain at this time. The patient has been in and out of the hospital for treatment of a stage IV decubitus ulcer in the sacrum. The patient requires a Harrel Lemon lift for transfers. She is sent to this office for an evaluation. She denies any neck pain or pain down the arms or any  weakness of the upper extremities.  Past Medical History:  Diagnosis Date  . Chronic heart failure ([place])   . Chronic indwelling Foley catheter    last changed few days ago  . Diabetes mellitus without complication ([place])   . Enlarged thoracic aorta ([place]) 03/14/2016  . H/O paraplegia    ascending paralysis unclear etiology  . Hypertension   . Lumbar spondylosis   . Morbid obesity ([place])   . OSA on CPAP   . Sacral decubitus ulcer, stage IV [facility])     Past Surgical History:  Procedure Laterality Date  . JOINT REPLACEMENT Right    Hip  . TONSILLECTOMY    . VAGINAL HYSTERECTOMY      Family History  Problem Relation Age of Onset  . Hypothyroidism Mother   . Diabetes Maternal Grandmother   . Heart disease Maternal Grandfather     Social history:  reports that she has never smoked. She has never used smokeless tobacco. She reports that she does not drink alcohol or use drugs.  Medications:  Prior to Admission medications   Medication Sig Start Date End Date Taking? Authorizing Provider  aspirin EC 81 MG tablet Take 81 mg by mouth daily.   Yes Historical Provider, MD  B Complex-C (B-COMPLEX WITH VITAMIN C) tablet Take 1 tablet by mouth daily.   Yes Historical Provider, MD  BUPROPION HCL PO Take 150 mg by mouth daily.   Yes Historical Provider, MD  cholecalciferol (VITAMIN D) 1000 units tablet Take 1,000  Units by mouth daily.   Yes Historical Provider, MD  docusate sodium (COLACE) 100 MG capsule Take 100 mg by mouth 2 (two) times daily.   Yes Historical Provider, MD  famotidine (PEPCID) 20 MG tablet Take 20 mg by mouth 2 (two) times daily.   Yes Historical Provider, MD  Lactobacillus TABS 250 mg by mouth bid   Yes Historical Provider, MD  Melatonin 3 MG TABS Take 1 tablet by mouth at bedtime.   Yes Historical Provider, MD  metoprolol succinate (TOPROL-XL) 100 MG 24 hr tablet Take 1 tablet (100 mg total) by mouth daily. Take with or immediately following a meal. Patient taking  differently: Take 50 mg by mouth daily. Take with or immediately following a meal. 03/20/16  Yes Domenic Polite, MD  oxybutynin (DITROPAN-XL) 10 MG 24 hr tablet Take 10 mg by mouth at bedtime.   Yes Historical Provider, MD  potassium chloride (K-DUR) 10 MEQ tablet Take 10 mEq by mouth once.    Yes Historical Provider, MD  senna (SENOKOT) 8.6 MG TABS tablet Take 1 tablet by mouth at bedtime.   Yes Historical Provider, MD  sertraline (ZOLOFT) 50 MG tablet Take 100 mg by mouth daily.    Yes Historical Provider, MD  torsemide (DEMADEX) 10 MG tablet Take 10 mg by mouth daily.   Yes Historical Provider, MD  UNABLE TO FIND Take 1 Dose by mouth daily. Med Name: Glucema   Yes Historical Provider, MD  zinc sulfate 220 (50 Zn) MG capsule Take 220 mg by mouth daily.   Yes Historical Provider, MD      Allergies  Allergen Reactions  . Levaquin [Levofloxacin In D5w] Other (See Comments)    Unknown childhood allergy  . Penicillins Other (See Comments)    Unknown childhood allergy    ROS:  Out of a complete 14 system review of symptoms, the patient complains only of the following symptoms, and all other reviewed systems are negative.  Fevers, chills, fatigue Heart murmur Skin rash, itching Incontinence of the bowel and bladder Easy bruising Feeling hot, cold, flushing Numbness, weakness Frequent infections Depression, anxiety, too much sleep, decreased energy, change in appetite, suicidal thoughts Sleepiness  Blood pressure 115/75, pulse 68, height 5\' 5"  (1.651 m), SpO2 95 %.  Physical Exam  General: The patient is alert and cooperative at the time of the examination. The patient is markedly obese.  Eyes: Pupils are equal, round, and reactive to light. Discs are flat bilaterally.  Neck: The neck is supple, no carotid bruits are noted.  Respiratory: The respiratory examination is clear.  Cardiovascular: The cardiovascular examination reveals a regular rate and rhythm, no obvious murmurs or  rubs are noted.  Skin: Extremities are with 1-2+ edema below the knees bilaterally.  Neurologic Exam  Mental status: The patient is alert and oriented x 3 at the time of the examination. The patient has apparent normal recent and remote memory, with an apparently normal attention span and concentration ability.  Cranial nerves: Facial symmetry is present. There is good sensation of the face to pinprick and soft touch bilaterally. The strength of the facial muscles and the muscles to head turning and shoulder shrug are normal bilaterally. Speech is well enunciated, no aphasia or dysarthria is noted. Extraocular movements are full. Visual fields are full. The tongue is midline, and the patient has symmetric elevation of the soft palate. No obvious hearing deficits are noted.  Motor: The motor testing reveals 5 over 5 strength of the upper extremities. With  the lower extremities, the patient has no voluntary movement on either side. Increased motor tone is noted on the lower extremities.  Sensory: Sensory testing is intact to pinprick, soft touch, vibration sensation, and position sense on the upper extremities. The patient is essentially anesthetic to pinprick, soft touch, vibration and position sense from the T5 level down. No evidence of extinction is noted.  Coordination: Cerebellar testing reveals good finger-nose-finger bilaterally. The patient is not able to perform heel-to-shin on either side.  Gait and station: Gait could not be tested, the patient is paraplegic.  Reflexes: Deep tendon reflexes are symmetric, but are somewhat brisk in the arms, reflexes are of lower intensity in the legs but are symmetric. Toes are upgoing bilaterally.   Assessment/Plan:  1. Spastic paraplegia  The patient reports a relatively rapid onset of a spastic paraplegia with a T5 sensory level. The history suggests a transverse myelitis involving the mid to upper thoracic spinal cord. The etiology is not  clear, we will need to get the medical records. The possibility of an autoimmune or viral transverse myelitis or an ischemic transverse myelitis needs to be considered. The patient does have a thoracic ascending aneurysm of the aorta. She will follow-up in 4 months. I will need to review the past medical records in detail once I receive them.   Jill Alexanders MD 01/02/2017 2:20 PM  [facility] [address] [place], [place] [address]  Phone [phone] Fax [phone]

## 2017-01-03 NOTE — Telephone Encounter (Signed)
I did make a comment that if she has a grade 4 sacral decubitus ulcer, minimizing pressure on the area is a standard method of treatment. Having her sitting for any length of time is not recommended.  I am not directly involved with treating her sacral decubitus ulcer, the individuals involved should be aware that putting pressure on a pressure sore is not good.

## 2017-02-28 ENCOUNTER — Emergency Department (HOSPITAL_COMMUNITY)
Admission: EM | Admit: 2017-02-28 | Discharge: 2017-02-28 | Disposition: A | Discharge status: Home / Self Care | Payer: BLUE CROSS/BLUE SHIELD | Attending: Emergency Medicine | Admitting: Emergency Medicine

## 2017-02-28 ENCOUNTER — Encounter (HOSPITAL_COMMUNITY): Payer: Self-pay | Admitting: Emergency Medicine

## 2017-02-28 DIAGNOSIS — G8220 Paraplegia, unspecified: Secondary | ICD-10-CM | POA: Diagnosis not present

## 2017-02-28 DIAGNOSIS — Z96641 Presence of right artificial hip joint: Secondary | ICD-10-CM | POA: Insufficient documentation

## 2017-02-28 DIAGNOSIS — Z79899 Other long term (current) drug therapy: Secondary | ICD-10-CM | POA: Insufficient documentation

## 2017-02-28 DIAGNOSIS — T50905A Adverse effect of unspecified drugs, medicaments and biological substances, initial encounter: Secondary | ICD-10-CM

## 2017-02-28 DIAGNOSIS — Z7982 Long term (current) use of aspirin: Secondary | ICD-10-CM | POA: Diagnosis not present

## 2017-02-28 DIAGNOSIS — R001 Bradycardia, unspecified: Secondary | ICD-10-CM | POA: Diagnosis present

## 2017-02-28 DIAGNOSIS — N3000 Acute cystitis without hematuria: Secondary | ICD-10-CM | POA: Diagnosis not present

## 2017-02-28 DIAGNOSIS — E11628 Type 2 diabetes mellitus with other skin complications: Secondary | ICD-10-CM | POA: Diagnosis not present

## 2017-02-28 DIAGNOSIS — I110 Hypertensive heart disease with heart failure: Secondary | ICD-10-CM | POA: Insufficient documentation

## 2017-02-28 DIAGNOSIS — I5032 Chronic diastolic (congestive) heart failure: Secondary | ICD-10-CM | POA: Insufficient documentation

## 2017-02-28 LAB — COMPREHENSIVE METABOLIC PANEL
ALT: 12 U/L — ABNORMAL LOW (ref 14–54)
AST: 18 U/L (ref 15–41)
Albumin: 2.9 g/dL — ABNORMAL LOW (ref 3.5–5.0)
Alkaline Phosphatase: 80 U/L (ref 38–126)
Anion gap: 8 (ref 5–15)
BILIRUBIN TOTAL: 0.7 mg/dL (ref 0.3–1.2)
BUN: 27 mg/dL — AB (ref 6–20)
CO2: 25 mmol/L (ref 22–32)
Calcium: 11.3 mg/dL — ABNORMAL HIGH (ref 8.9–10.3)
Chloride: 106 mmol/L (ref 101–111)
Creatinine, Ser: 0.77 mg/dL (ref 0.44–1.00)
GFR calc Af Amer: >60 mL/min (ref >60)
Glucose, Bld: 113 mg/dL — ABNORMAL HIGH (ref 65–99)
POTASSIUM: 3.7 mmol/L (ref 3.5–5.1)
Sodium: 139 mmol/L (ref 135–145)
TOTAL PROTEIN: 6.1 g/dL — AB (ref 6.5–8.1)

## 2017-02-28 LAB — I-STAT CG4 LACTIC ACID, ED: LACTIC ACID, VENOUS: 0.98 mmol/L (ref 0.5–1.9)

## 2017-02-28 LAB — CBC WITH DIFFERENTIAL/PLATELET
BASOS PCT: 1 %
Basophils Absolute: 0.1 10*3/uL (ref 0.0–0.1)
EOS ABS: 0.3 10*3/uL (ref 0.0–0.7)
EOS PCT: 3 %
HCT: 38.6 % (ref 36.0–46.0)
Hemoglobin: 11.9 g/dL — ABNORMAL LOW (ref 12.0–15.0)
Lymphocytes Relative: 41 %
Lymphs Abs: 3.7 10*3/uL (ref 0.7–4.0)
MCH: 24.3 pg — ABNORMAL LOW (ref 26.0–34.0)
MCHC: 30.8 g/dL (ref 30.0–36.0)
MCV: 78.9 fL (ref 78.0–100.0)
MONO ABS: 0.6 10*3/uL (ref 0.1–1.0)
Monocytes Relative: 6 %
Neutro Abs: 4.5 10*3/uL (ref 1.7–7.7)
Neutrophils Relative %: 49 %
PLATELETS: 286 10*3/uL (ref 150–400)
RBC: 4.89 MIL/uL (ref 3.87–5.11)
RDW: 17.5 % — AB (ref 11.5–15.5)
WBC: 9.1 10*3/uL (ref 4.0–10.5)

## 2017-02-28 LAB — PROTIME-INR
INR: 1.05
PROTHROMBIN TIME: 13.7 s (ref 11.4–15.2)

## 2017-02-28 LAB — URINALYSIS, ROUTINE W REFLEX MICROSCOPIC
Bilirubin Urine: NEGATIVE
GLUCOSE, UA: NEGATIVE mg/dL
Hgb urine dipstick: NEGATIVE
Ketones, ur: NEGATIVE mg/dL
Nitrite: POSITIVE — AB
PH: 6 (ref 5.0–8.0)
Protein, ur: 30 mg/dL — AB
Specific Gravity, Urine: 1.013 (ref 1.005–1.030)
Squamous Epithelial / LPF: NONE SEEN

## 2017-02-28 MED ORDER — CEPHALEXIN 500 MG PO CAPS: 500.0000 mg | ORAL_CAPSULE | Freq: Two times a day (BID) | ORAL | 0 refills | Status: AC

## 2017-02-28 MED ORDER — DEXTROSE 5 % IV SOLN
1.0000 g | Freq: Once | INTRAVENOUS | Status: AC
  Administered 2017-02-28: 1 g via INTRAVENOUS
  Filled 2017-02-28: qty 10

## 2017-02-28 NOTE — ED Notes (Signed)
Pt given water to drink. 

## 2017-02-28 NOTE — ED Provider Notes (Signed)
[facility] Provider Note   CSN: [id number] Arrival date & time: 02/28/17  1449     History   Chief Complaint Chief Complaint  Patient presents with  . Post-op Problem  . Hypotension    HPI Sharon Warner is a 66 y.o. female.  The history is provided by the patient, medical records, the spouse and a relative. No language interpreter was used.  Illness  This is a new (sent on recommendations from home health nurse) problem. Pertinent negatives include no chest pain, no abdominal pain and no shortness of breath.    Past Medical History:  Diagnosis Date  . Chronic heart failure ([place])   . Chronic indwelling Foley catheter    last changed few days ago  . Diabetes mellitus without complication ([place])   . Enlarged thoracic aorta ([place]) 03/14/2016  . H/O paraplegia    ascending paralysis unclear etiology  . Hypertension   . Lumbar spondylosis   . Morbid obesity ([place])   . OSA on CPAP   . Sacral decubitus ulcer, stage IV [facility])     Patient Active Problem List   Diagnosis Date Noted  . Obesity hypoventilation syndrome ([place]) 08/15/2016  . Sacral decubitus ulcer, stage IV ([place]) 08/15/2016  . Chronic diastolic heart failure ([place]) 07/08/2016  . Hypercalcemia 04/18/2016  . Type 2 diabetes mellitus with skin complication, with long-term current use of insulin ([place]) 04/16/2016  . Protein-calorie malnutrition ([place]) 04/16/2016  . Multinodular goiter 04/16/2016  . Decubital ulcer   . Pyogenic inflammation of bone ([place])   . Diarrhea 03/17/2016  . 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 Proctitis 03/16/2016  . Bacteremia 03/15/2016  . Severe sepsis ([place]) 03/14/2016  . Morbid obesity ([place]) 03/14/2016  . Hypokalemia 03/14/2016  . Type 2 diabetes mellitus ([place]) 03/14/2016  . AKI (acute kidney injury) ([place]) 03/14/2016  . Lactic acidosis 03/14/2016  . Multiple thyroid nodules 03/14/2016  . Enlarged thoracic aorta ([place]) 03/14/2016  . HTN (hypertension) 03/14/2016  .  Pressure ulcer 03/14/2016  . Paraplegia ([place]) 03/14/2016  . Neurogenic bladder 03/14/2016  . Infected decubitus ulcer 03/14/2016  . Obstructive sleep apnea 03/14/2016  . Normocytic anemia 03/14/2016    Past Surgical History:  Procedure Laterality Date  . JOINT REPLACEMENT Right    Hip  . TONSILLECTOMY    . VAGINAL HYSTERECTOMY      OB History    No data available       Home Medications    Prior to Admission medications   Medication Sig Start Date End Date Taking? Authorizing Provider  aspirin EC 81 MG tablet Take 81 mg by mouth daily.   Yes [provider]  cholecalciferol (VITAMIN D) 1000 units tablet Take 1,000 Units by mouth daily.   Yes [provider]  docusate sodium (COLACE) 100 MG capsule Take 100 mg by mouth as needed for moderate constipation.    Yes [provider]  Emollient ([employer]) OINT Apply 1 application topically 2 (two) times daily as needed for pain. shoulder 02/13/17  Yes [provider]  furosemide (LASIX) 20 MG tablet Take 20 mg by mouth daily.   Yes [provider]  Lactobacillus TABS 250 mg by mouth bid   Yes [provider]  Melatonin 3 MG TABS Take 3 mg by mouth as needed (sleep).    Yes [provider]  metoprolol succinate (TOPROL-XL) 100 MG 24 hr tablet Take 1 tablet (100 mg total) by mouth daily. Take with or immediately following a  meal. Patient taking differently: Take 50 mg by mouth daily. Take with or immediately following a meal. 03/20/16  Yes Domenic Polite, MD  naproxen sodium (ANAPROX) 220 MG tablet Take 440 mg by mouth as needed (pain temperature).   Yes [provider]  potassium chloride (K-DUR) 10 MEQ tablet Take 10 mEq by mouth 2 (two) times daily.    Yes [provider]  senna (SENOKOT) 8.6 MG TABS tablet Take 1 tablet by mouth at bedtime.   Yes [provider]  UNABLE TO FIND Take 1 Dose by mouth daily. Med Name: Protein drink   Yes  [provider]  cephALEXin (KEFLEX) 500 MG capsule Take 1 capsule (500 mg total) by mouth 2 (two) times daily. 02/28/17 03/07/17  Payton Emerald, MD    Family History Family History  Problem Relation Age of Onset  . Hypothyroidism Mother   . Diabetes Maternal Grandmother   . Heart disease Maternal Grandfather     Social History Social History  Substance Use Topics  . Smoking status: Never Smoker  . Smokeless tobacco: Never Used  . Alcohol use No     Allergies   Levaquin [levofloxacin in d5w] and Penicillins   Review of Systems Review of Systems  Constitutional: Negative for chills and fever.  HENT: Negative for ear pain and sore throat.   Eyes: Negative for pain and visual disturbance.  Respiratory: Negative for cough and shortness of breath.   Cardiovascular: Negative for chest pain and palpitations.  Gastrointestinal: Negative for abdominal pain and vomiting.  Genitourinary: Negative for dysuria and hematuria.  Musculoskeletal: Negative for arthralgias and back pain.  Skin: Negative for color change and rash.  Neurological: Negative for seizures and syncope.  All other systems reviewed and are negative.    Physical Exam Updated Vital Signs BP 123/64 (BP Location: Left Arm)   Pulse (!) 52   Temp 98.1 F (36.7 C) (Axillary)   Resp 18   SpO2 100%   Physical Exam  Constitutional: She appears well-developed. No distress.  HENT:  Head: Normocephalic and atraumatic.  Eyes: Conjunctivae are normal.  Neck: Neck supple.  Cardiovascular: Regular rhythm.  Bradycardia present.   No murmur heard. Pulmonary/Chest: Effort normal and breath sounds normal. No respiratory distress.  Abdominal: Soft. There is no tenderness.  Colostomy LLQ with brown stool. Stoma without obvious abnormality. Open wound of infra mid-abdomen with clean packing material; patient states from first colostomy placement attempt.   Musculoskeletal: She exhibits no edema.  Decubitus sacral ulcer  with exposed underlying tissue. No surrounding erythema or active drainage.  Neurological: She is alert.  Baseline neuro exam. 5/5 motor strength BUE. Paraplegia of BLE  Skin: Skin is warm and dry.  Nursing note and vitals reviewed.    ED Treatments / Results  Labs (all labs ordered are listed, but only abnormal results are displayed) Labs Reviewed  COMPREHENSIVE METABOLIC PANEL - Abnormal; Notable for the following:       Result Value   Glucose, Bld 113 (*)    BUN 27 (*)    Calcium 11.3 (*)    Total Protein 6.1 (*)    Albumin 2.9 (*)    ALT 12 (*)    All other components within normal limits  CBC WITH DIFFERENTIAL/PLATELET - Abnormal; Notable for the following:    Hemoglobin 11.9 (*)    MCH 24.3 (*)    RDW 17.5 (*)    All other components within normal limits  URINALYSIS, ROUTINE W REFLEX MICROSCOPIC -  Abnormal; Notable for the following:    APPearance CLOUDY (*)    Protein, ur 30 (*)    Nitrite POSITIVE (*)    Leukocytes, UA LARGE (*)    Bacteria, UA MANY (*)    All other components within normal limits  CULTURE, BLOOD (ROUTINE X 2)  CULTURE, BLOOD (ROUTINE X 2)  PROTIME-INR  I-STAT CG4 LACTIC ACID, ED    EKG  EKG Interpretation  Date/Time:  Friday February 28 2017 15:07:50 EDT Ventricular Rate:  49 PR Interval:    QRS Duration: 111 QT Interval:  411 QTC Calculation: 371 R Axis:   -33 Text Interpretation:  Sinus bradycardia Left axis deviation Artifact T wave abnormality Abnormal ekg Confirmed by Carmin Muskrat [phone]) on 02/28/2017 4:17:12 PM       Radiology No results found.  Procedures Procedures (including critical care time)  Medications Ordered in ED Medications  cefTRIAXone (ROCEPHIN) 1 g in dextrose 5 % 50 mL IVPB (0 g Intravenous Stopped 02/28/17 1819)     Initial Impression / Assessment and Plan / ED Course  I have reviewed the triage vital signs and the nursing notes.  Pertinent labs & imaging results that were available during my care of the  patient were reviewed by me and considered in my medical decision making (see chart for details).     66 year old female history of paraplegia, CHF, diabetes, HTN, colostomy, and stage IV decubitus ulcer who presents at request of home health nurse.  Patient with several year history of paraplegia in setting of transverse myelitis of unclear etiology.  She currently is lives at home with home health nursing assistance.  Today, home health nurse was concerned about possible sepsis. Patient was noted to be bradycardic.  No fevers or other symptoms.  Patient was admitted 1 year ago for sepsis related to decubitus sacral ulcer.  No current pain or noted drainage from sacral ulcer.  Afebrile, bradycardic 50-60s, otherwise VSS.  Patient baseline neuro exam.  Lungs clear to auscultation bilaterally.  Abdomen soft benign throughout.  Decubitus sacral ulcer with clean borders without active drainage.  Labwork showing WBC 9.1k, hgb 11.9. CMP unremarkable. Lactic acid 0.98. UA showing evidence of UTI with positive nitrite, large leukocytes, and many bacteria.  Patient without urinary sx. Suspect cystitis. Bradycardia may be related to current metoprolol rx. Pt remains HDS. Instructed on close f/u with PCP to consider reducing metoprolol dose. Labwork without signs of sepsis or systemic infection. Pt remains at baseline. Rocephin given. Rx for keflex provided.   Return precautions provided for worsening sx. Pt will f/u with PCP at first availability. Pt and family verbalized agreement with plan.   Final Clinical Impressions(s) / ED Diagnoses   Final diagnoses:  Bradycardia, drug induced  Acute cystitis without hematuria    New Prescriptions Discharge Medication List as of 02/28/2017  5:14 PM    START taking these medications   Details  cephALEXin (KEFLEX) 500 MG capsule Take 1 capsule (500 mg total) by mouth 2 (two) times daily., Starting Fri 02/28/2017, Until Fri 03/07/2017, Print         Payton Emerald,  MD 03/01/17 [id number]    Carmin Muskrat, MD 03/02/17 [phone]
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
## 2017-02-28 NOTE — ED Notes (Signed)
Urinary drainage bag replaced so clean urine sample can be collected.

## 2017-02-28 NOTE — ED Notes (Signed)
Pt given water, [place] sandwich, and applesauce to eat.

## 2017-02-28 NOTE — Care Management (Signed)
ED CM consulted by Dr. Vanita Panda concerning patient Sharon Warner needs.  Patient reported that she is active with [facility] and receives RN services for wound care but stated she was not receiving ostomy care. CM contacted [facility] who clarified that patient does receive ostomy care as well but she voiced that she it appears that her husband is having difficulty continuing to care for her at home, and patient may need a higher level of care. ED CM questioned the possibility of adding a HH SW to assess and work on placement from home if patient does not meet criteria for inpatient admission. Updated DMu ED Resident. [place] orders will be obtained if patient does not meet criteria for hospital stay.

## 2017-02-28 NOTE — ED Triage Notes (Signed)
Per [facility], pt from home, home health nurse called out for bradycardia, hypotension, and "infection". EMs states she has an old colostomy site that never fully healed. Patient states "theres something stuck up inside it and they cant get it out." Drainage noted on dressing. Pt denies fevers. Pt 479 systolic per ems, HR 46 sinus brady. Pt is AAOX4. Denies pain.

## 2017-03-05 LAB — CULTURE, BLOOD (ROUTINE X 2)
Culture: NO GROWTH
Culture: NO GROWTH
SPECIAL REQUESTS: ADEQUATE
Special Requests: ADEQUATE

## 2017-04-04 ENCOUNTER — Inpatient Hospital Stay (HOSPITAL_COMMUNITY)
Admission: EM | Admit: 2017-04-04 | Discharge: 2017-04-16 | DRG: 871 | Disposition: A | Discharge status: Other Acute Inpatient Hospital | Payer: BLUE CROSS/BLUE SHIELD | Attending: Internal Medicine | Admitting: Internal Medicine

## 2017-04-04 ENCOUNTER — Encounter (HOSPITAL_COMMUNITY): Payer: Self-pay | Admitting: Emergency Medicine

## 2017-04-04 ENCOUNTER — Emergency Department (HOSPITAL_COMMUNITY): Payer: BLUE CROSS/BLUE SHIELD

## 2017-04-04 DIAGNOSIS — R0689 Other abnormalities of breathing: Secondary | ICD-10-CM

## 2017-04-04 DIAGNOSIS — E11622 Type 2 diabetes mellitus with other skin ulcer: Secondary | ICD-10-CM | POA: Diagnosis present

## 2017-04-04 DIAGNOSIS — B964 Proteus (mirabilis) (morganii) as the cause of diseases classified elsewhere: Secondary | ICD-10-CM | POA: Diagnosis present

## 2017-04-04 DIAGNOSIS — I110 Hypertensive heart disease with heart failure: Secondary | ICD-10-CM | POA: Diagnosis present

## 2017-04-04 DIAGNOSIS — R0602 Shortness of breath: Secondary | ICD-10-CM

## 2017-04-04 DIAGNOSIS — J040 Acute laryngitis: Secondary | ICD-10-CM | POA: Diagnosis present

## 2017-04-04 DIAGNOSIS — Z7982 Long term (current) use of aspirin: Secondary | ICD-10-CM

## 2017-04-04 DIAGNOSIS — A419 Sepsis, unspecified organism: Secondary | ICD-10-CM | POA: Diagnosis not present

## 2017-04-04 DIAGNOSIS — B955 Unspecified streptococcus as the cause of diseases classified elsewhere: Secondary | ICD-10-CM | POA: Diagnosis present

## 2017-04-04 DIAGNOSIS — M461 Sacroiliitis, not elsewhere classified: Secondary | ICD-10-CM | POA: Diagnosis not present

## 2017-04-04 DIAGNOSIS — Z881 Allergy status to other antibiotic agents status: Secondary | ICD-10-CM

## 2017-04-04 DIAGNOSIS — R7881 Bacteremia: Secondary | ICD-10-CM | POA: Diagnosis present

## 2017-04-04 DIAGNOSIS — E46 Unspecified protein-calorie malnutrition: Secondary | ICD-10-CM | POA: Diagnosis present

## 2017-04-04 DIAGNOSIS — L89154 Pressure ulcer of sacral region, stage 4: Secondary | ICD-10-CM | POA: Diagnosis present

## 2017-04-04 DIAGNOSIS — Y92230 Patient room in hospital as the place of occurrence of the external cause: Secondary | ICD-10-CM | POA: Diagnosis not present

## 2017-04-04 DIAGNOSIS — A401 Sepsis due to streptococcus, group B: Principal | ICD-10-CM | POA: Diagnosis present

## 2017-04-04 DIAGNOSIS — Z993 Dependence on wheelchair: Secondary | ICD-10-CM

## 2017-04-04 DIAGNOSIS — I5043 Acute on chronic combined systolic (congestive) and diastolic (congestive) heart failure: Secondary | ICD-10-CM | POA: Diagnosis not present

## 2017-04-04 DIAGNOSIS — I5023 Acute on chronic systolic (congestive) heart failure: Secondary | ICD-10-CM | POA: Diagnosis not present

## 2017-04-04 DIAGNOSIS — I313 Pericardial effusion (noninflammatory): Secondary | ICD-10-CM | POA: Diagnosis present

## 2017-04-04 DIAGNOSIS — Z880 Allergy status to penicillin: Secondary | ICD-10-CM

## 2017-04-04 DIAGNOSIS — E876 Hypokalemia: Secondary | ICD-10-CM | POA: Diagnosis not present

## 2017-04-04 DIAGNOSIS — I10 Essential (primary) hypertension: Secondary | ICD-10-CM | POA: Diagnosis not present

## 2017-04-04 DIAGNOSIS — G373 Acute transverse myelitis in demyelinating disease of central nervous system: Secondary | ICD-10-CM | POA: Diagnosis present

## 2017-04-04 DIAGNOSIS — Z833 Family history of diabetes mellitus: Secondary | ICD-10-CM

## 2017-04-04 DIAGNOSIS — I959 Hypotension, unspecified: Secondary | ICD-10-CM | POA: Diagnosis not present

## 2017-04-04 DIAGNOSIS — I455 Other specified heart block: Secondary | ICD-10-CM | POA: Diagnosis not present

## 2017-04-04 DIAGNOSIS — I361 Nonrheumatic tricuspid (valve) insufficiency: Secondary | ICD-10-CM | POA: Diagnosis not present

## 2017-04-04 DIAGNOSIS — N390 Urinary tract infection, site not specified: Secondary | ICD-10-CM | POA: Diagnosis present

## 2017-04-04 DIAGNOSIS — Z79891 Long term (current) use of opiate analgesic: Secondary | ICD-10-CM

## 2017-04-04 DIAGNOSIS — I5032 Chronic diastolic (congestive) heart failure: Secondary | ICD-10-CM | POA: Diagnosis not present

## 2017-04-04 DIAGNOSIS — K6289 Other specified diseases of anus and rectum: Secondary | ICD-10-CM | POA: Diagnosis present

## 2017-04-04 DIAGNOSIS — B9689 Other specified bacterial agents as the cause of diseases classified elsewhere: Secondary | ICD-10-CM | POA: Diagnosis present

## 2017-04-04 DIAGNOSIS — M4628 Osteomyelitis of vertebra, sacral and sacrococcygeal region: Secondary | ICD-10-CM | POA: Diagnosis present

## 2017-04-04 DIAGNOSIS — G4733 Obstructive sleep apnea (adult) (pediatric): Secondary | ICD-10-CM | POA: Diagnosis present

## 2017-04-04 DIAGNOSIS — R000 Tachycardia, unspecified: Secondary | ICD-10-CM | POA: Diagnosis not present

## 2017-04-04 DIAGNOSIS — Z6832 Body mass index (BMI) 32.0-32.9, adult: Secondary | ICD-10-CM

## 2017-04-04 DIAGNOSIS — A4102 Sepsis due to Methicillin resistant Staphylococcus aureus: Secondary | ICD-10-CM | POA: Diagnosis not present

## 2017-04-04 DIAGNOSIS — R509 Fever, unspecified: Secondary | ICD-10-CM | POA: Diagnosis present

## 2017-04-04 DIAGNOSIS — J9601 Acute respiratory failure with hypoxia: Secondary | ICD-10-CM | POA: Diagnosis present

## 2017-04-04 DIAGNOSIS — R490 Dysphonia: Secondary | ICD-10-CM | POA: Diagnosis not present

## 2017-04-04 DIAGNOSIS — E119 Type 2 diabetes mellitus without complications: Secondary | ICD-10-CM

## 2017-04-04 DIAGNOSIS — Z96641 Presence of right artificial hip joint: Secondary | ICD-10-CM | POA: Diagnosis present

## 2017-04-04 DIAGNOSIS — M47818 Spondylosis without myelopathy or radiculopathy, sacral and sacrococcygeal region: Secondary | ICD-10-CM

## 2017-04-04 DIAGNOSIS — N319 Neuromuscular dysfunction of bladder, unspecified: Secondary | ICD-10-CM | POA: Diagnosis present

## 2017-04-04 DIAGNOSIS — Z90710 Acquired absence of both cervix and uterus: Secondary | ICD-10-CM

## 2017-04-04 DIAGNOSIS — Z8249 Family history of ischemic heart disease and other diseases of the circulatory system: Secondary | ICD-10-CM

## 2017-04-04 DIAGNOSIS — R1310 Dysphagia, unspecified: Secondary | ICD-10-CM

## 2017-04-04 DIAGNOSIS — S31000A Unspecified open wound of lower back and pelvis without penetration into retroperitoneum, initial encounter: Secondary | ICD-10-CM

## 2017-04-04 DIAGNOSIS — T502X5A Adverse effect of carbonic-anhydrase inhibitors, benzothiadiazides and other diuretics, initial encounter: Secondary | ICD-10-CM | POA: Diagnosis not present

## 2017-04-04 DIAGNOSIS — I495 Sick sinus syndrome: Secondary | ICD-10-CM | POA: Diagnosis present

## 2017-04-04 DIAGNOSIS — G8220 Paraplegia, unspecified: Secondary | ICD-10-CM | POA: Diagnosis present

## 2017-04-04 DIAGNOSIS — Z8349 Family history of other endocrine, nutritional and metabolic diseases: Secondary | ICD-10-CM

## 2017-04-04 DIAGNOSIS — D509 Iron deficiency anemia, unspecified: Secondary | ICD-10-CM | POA: Diagnosis present

## 2017-04-04 DIAGNOSIS — E042 Nontoxic multinodular goiter: Secondary | ICD-10-CM | POA: Diagnosis present

## 2017-04-04 DIAGNOSIS — Z933 Colostomy status: Secondary | ICD-10-CM

## 2017-04-04 DIAGNOSIS — Z791 Long term (current) use of non-steroidal anti-inflammatories (NSAID): Secondary | ICD-10-CM

## 2017-04-04 DIAGNOSIS — E1169 Type 2 diabetes mellitus with other specified complication: Secondary | ICD-10-CM | POA: Diagnosis present

## 2017-04-04 DIAGNOSIS — R001 Bradycardia, unspecified: Secondary | ICD-10-CM | POA: Diagnosis not present

## 2017-04-04 DIAGNOSIS — Z5329 Procedure and treatment not carried out because of patient's decision for other reasons: Secondary | ICD-10-CM | POA: Diagnosis not present

## 2017-04-04 DIAGNOSIS — Z7401 Bed confinement status: Secondary | ICD-10-CM

## 2017-04-04 DIAGNOSIS — J9811 Atelectasis: Secondary | ICD-10-CM | POA: Diagnosis present

## 2017-04-04 DIAGNOSIS — Z79899 Other long term (current) drug therapy: Secondary | ICD-10-CM

## 2017-04-04 HISTORY — DX: Acute on chronic systolic (congestive) heart failure: I50.23

## 2017-04-04 LAB — URINALYSIS, ROUTINE W REFLEX MICROSCOPIC
Bilirubin Urine: NEGATIVE
Glucose, UA: NEGATIVE mg/dL
Ketones, ur: NEGATIVE mg/dL
Nitrite: NEGATIVE
Protein, ur: 30 mg/dL — AB
SPECIFIC GRAVITY, URINE: 1.013 (ref 1.005–1.030)
pH: 6 (ref 5.0–8.0)

## 2017-04-04 LAB — CBC WITH DIFFERENTIAL/PLATELET
BASOS PCT: 0 %
Basophils Absolute: 0 10*3/uL (ref 0.0–0.1)
EOS ABS: 0 10*3/uL (ref 0.0–0.7)
Eosinophils Relative: 0 %
HCT: 37.4 % (ref 36.0–46.0)
HEMOGLOBIN: 11.8 g/dL — AB (ref 12.0–15.0)
Lymphocytes Relative: 22 %
Lymphs Abs: 2.3 10*3/uL (ref 0.7–4.0)
MCH: 24.4 pg — ABNORMAL LOW (ref 26.0–34.0)
MCHC: 31.6 g/dL (ref 30.0–36.0)
MCV: 77.3 fL — ABNORMAL LOW (ref 78.0–100.0)
MONOS PCT: 8 %
Monocytes Absolute: 0.8 10*3/uL (ref 0.1–1.0)
NEUTROS PCT: 70 %
Neutro Abs: 7.4 10*3/uL (ref 1.7–7.7)
Platelets: 373 10*3/uL (ref 150–400)
RBC: 4.84 MIL/uL (ref 3.87–5.11)
RDW: 15.9 % — AB (ref 11.5–15.5)
WBC: 10.6 10*3/uL — ABNORMAL HIGH (ref 4.0–10.5)

## 2017-04-04 LAB — I-STAT CG4 LACTIC ACID, ED
Lactic Acid, Venous: 1.89 mmol/L (ref 0.5–1.9)
Lactic Acid, Venous: 0.73 mmol/L (ref 0.5–1.9)

## 2017-04-04 LAB — COMPREHENSIVE METABOLIC PANEL
ALK PHOS: 68 U/L (ref 38–126)
ALT: 12 U/L — ABNORMAL LOW (ref 14–54)
ANION GAP: 9 (ref 5–15)
AST: 34 U/L (ref 15–41)
Albumin: 3.4 g/dL — ABNORMAL LOW (ref 3.5–5.0)
BUN: 21 mg/dL — ABNORMAL HIGH (ref 6–20)
CALCIUM: 10.8 mg/dL — AB (ref 8.9–10.3)
CO2: 25 mmol/L (ref 22–32)
Chloride: 101 mmol/L (ref 101–111)
Creatinine, Ser: 0.7 mg/dL (ref 0.44–1.00)
GLUCOSE: 145 mg/dL — AB (ref 65–99)
Potassium: 4.3 mmol/L (ref 3.5–5.1)
Sodium: 135 mmol/L (ref 135–145)
Total Bilirubin: 1.4 mg/dL — ABNORMAL HIGH (ref 0.3–1.2)
Total Protein: 7.2 g/dL (ref 6.5–8.1)

## 2017-04-04 LAB — SEDIMENTATION RATE: Sed Rate: 49 mm/hr — ABNORMAL HIGH (ref 0–22)

## 2017-04-04 LAB — PROTIME-INR
INR: 1.24
Prothrombin Time: 15.6 seconds — ABNORMAL HIGH (ref 11.4–15.2)

## 2017-04-04 LAB — TSH: TSH: 1.5 u[IU]/mL (ref 0.350–4.500)

## 2017-04-04 LAB — MRSA PCR SCREENING: MRSA by PCR: NEGATIVE

## 2017-04-04 MED ORDER — ACETAMINOPHEN 325 MG PO TABS
650.0000 mg | ORAL_TABLET | Freq: Once | ORAL | Status: AC
  Administered 2017-04-04: 650 mg via ORAL
  Filled 2017-04-04: qty 2

## 2017-04-04 MED ORDER — CEFTRIAXONE SODIUM 2 G IJ SOLR
2.0000 g | Freq: Once | INTRAMUSCULAR | Status: AC
  Administered 2017-04-04: 2 g via INTRAVENOUS
  Filled 2017-04-04: qty 2

## 2017-04-04 MED ORDER — SODIUM CHLORIDE 0.9 % IV BOLUS (SEPSIS)
1000.0000 mL | Freq: Once | INTRAVENOUS | Status: AC
  Administered 2017-04-04: 1000 mL via INTRAVENOUS

## 2017-04-04 MED ORDER — SODIUM CHLORIDE 0.9% FLUSH
3.0000 mL | Freq: Two times a day (BID) | INTRAVENOUS | Status: DC
  Administered 2017-04-06 – 2017-04-16 (×12): 3 mL via INTRAVENOUS

## 2017-04-04 MED ORDER — ONDANSETRON HCL 4 MG PO TABS: 4.0000 mg | ORAL_TABLET | Freq: Four times a day (QID) | ORAL | Status: DC | PRN

## 2017-04-04 MED ORDER — SODIUM CHLORIDE 0.9 % IV SOLN
INTRAVENOUS | Status: DC
  Administered 2017-04-04 – 2017-04-09 (×9): via INTRAVENOUS

## 2017-04-04 MED ORDER — ACETAMINOPHEN 650 MG RE SUPP: 650.0000 mg | Freq: Four times a day (QID) | RECTAL | Status: DC | PRN

## 2017-04-04 MED ORDER — ASPIRIN EC 81 MG PO TBEC
81.0000 mg | DELAYED_RELEASE_TABLET | Freq: Every day | ORAL | Status: DC
  Administered 2017-04-04 – 2017-04-16 (×13): 81 mg via ORAL
  Filled 2017-04-04 (×13): qty 1

## 2017-04-04 MED ORDER — NAPROXEN SODIUM 275 MG PO TABS
275.0000 mg | ORAL_TABLET | Freq: Three times a day (TID) | ORAL | Status: DC | PRN
  Filled 2017-04-04 (×2): qty 1

## 2017-04-04 MED ORDER — CEFTRIAXONE SODIUM 1 G IJ SOLR
1.0000 g | INTRAMUSCULAR | Status: DC
  Administered 2017-04-05: 1 g via INTRAVENOUS
  Filled 2017-04-04: qty 10

## 2017-04-04 MED ORDER — ONDANSETRON HCL 4 MG/2ML IJ SOLN: 4.0000 mg | Freq: Four times a day (QID) | INTRAMUSCULAR | Status: DC | PRN

## 2017-04-04 MED ORDER — TRAMADOL-ACETAMINOPHEN 37.5-325 MG PO TABS
1.0000 | ORAL_TABLET | ORAL | Status: DC | PRN
  Administered 2017-04-04 – 2017-04-11 (×3): 1 via ORAL
  Filled 2017-04-04 (×3): qty 1

## 2017-04-04 MED ORDER — RISAQUAD PO CAPS
2.0000 | ORAL_CAPSULE | Freq: Three times a day (TID) | ORAL | Status: DC
  Administered 2017-04-04 – 2017-04-16 (×34): 2 via ORAL
  Filled 2017-04-04 (×34): qty 2

## 2017-04-04 MED ORDER — METOPROLOL SUCCINATE ER 25 MG PO TB24
25.0000 mg | ORAL_TABLET | Freq: Every evening | ORAL | Status: DC
Start: 2017-04-04 — End: 2017-04-07
  Administered 2017-04-04 – 2017-04-06 (×2): 25 mg via ORAL
  Filled 2017-04-04 (×2): qty 1

## 2017-04-04 MED ORDER — ENOXAPARIN SODIUM 40 MG/0.4ML ~~LOC~~ SOLN
40.0000 mg | SUBCUTANEOUS | Status: DC
  Administered 2017-04-04 – 2017-04-15 (×12): 40 mg via SUBCUTANEOUS
  Filled 2017-04-04 (×12): qty 0.4

## 2017-04-04 MED ORDER — ACETAMINOPHEN 325 MG PO TABS
650.0000 mg | ORAL_TABLET | Freq: Four times a day (QID) | ORAL | Status: DC | PRN
  Administered 2017-04-05 – 2017-04-14 (×4): 650 mg via ORAL
  Filled 2017-04-04 (×4): qty 2

## 2017-04-04 NOTE — ED Provider Notes (Signed)
[place] DEPT Provider Note   CSN: [id number] Arrival date & time: 04/04/17  1132     History   Chief Complaint Chief Complaint  Patient presents with  . Fever  . Tachycardia    HPI Sharon Warner is a 66 y.o. female.  HPI Patient presents with fever and tachycardia. 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 Has chronic colostomy and chronic Foley catheter. Fevers began today. Up to 101.5. Heart rate of been 130 for EMS. States urine is more cloudy today has sediment. Has chronic stage IV decubitus ulcers 2. No drainage from that. No cough. No headache. No confusion. States that she feels good overall. A few weeks ago had had Keflex for UTI.   Past Medical History:  Diagnosis Date  . Chronic heart failure ([place])   . Chronic indwelling Foley catheter    last changed few days ago  . Diabetes mellitus without complication ([place])   . Enlarged thoracic aorta ([place]) 03/14/2016  . H/O paraplegia    ascending paralysis unclear etiology  . Hypertension   . Lumbar spondylosis   . Morbid obesity ([place])   . OSA on CPAP   . Sacral decubitus ulcer, stage IV [facility])     Patient Active Problem List   Diagnosis Date Noted  . Obesity hypoventilation syndrome ([place]) 08/15/2016  . Sacral decubitus ulcer, stage IV ([place]) 08/15/2016  . Chronic diastolic heart failure ([place]) 07/08/2016  . Hypercalcemia 04/18/2016  . Type 2 diabetes mellitus with skin complication, with long-term current use of insulin ([place]) 04/16/2016  . Protein-calorie malnutrition ([place]) 04/16/2016  . Multinodular goiter 04/16/2016  . Decubital ulcer   . Pyogenic inflammation of bone ([place])   . Diarrhea 03/17/2016  . Sacral osteomyelitis ([place]) 03/16/2016  . Proctitis 03/16/2016  . Bacteremia 03/15/2016  . Severe sepsis ([place]) 03/14/2016  . Morbid obesity ([place]) 03/14/2016  . Hypokalemia 03/14/2016  . Type 2 diabetes mellitus ([place]) 03/14/2016  . AKI (acute kidney injury) ([place]) 03/14/2016  . Lactic acidosis 03/14/2016    . Multiple thyroid nodules 03/14/2016  . Enlarged thoracic aorta ([place]) 03/14/2016  . HTN (hypertension) 03/14/2016  . Pressure ulcer 03/14/2016  . Paraplegia ([place]) 03/14/2016  . Neurogenic bladder 03/14/2016  . Infected decubitus ulcer 03/14/2016  . Obstructive sleep apnea 03/14/2016  . Normocytic anemia 03/14/2016    Past Surgical History:  Procedure Laterality Date  . JOINT REPLACEMENT Right    Hip  . TONSILLECTOMY    . VAGINAL HYSTERECTOMY      OB History    No data available       Home Medications    Prior to Admission medications   Medication Sig Start Date End Date Taking? Authorizing Provider  aspirin EC 81 MG tablet Take 81 mg by mouth daily.   Yes [provider]  b complex vitamins tablet Take 1 tablet by mouth daily.   Yes [provider]  Biotin 10000 MCG TABS Take 1,000 mcg by mouth daily.   Yes [provider]  cholecalciferol (VITAMIN D) 1000 units tablet Take 2,000 Units by mouth daily.    Yes [provider]  Lactobacillus TABS 250 mg by mouth bid   Yes [provider]  Melatonin 3 MG TABS Take 3 mg by mouth as needed (sleep).    Yes [provider]  metoprolol succinate (TOPROL-XL) 25 MG 24 hr tablet Take 25 mg by mouth daily. 03/26/17  Yes [provider]  naproxen sodium (ANAPROX) 220 MG tablet Take 440 mg by mouth as  needed (pain temperature).   Yes [provider]  potassium chloride (KLOR-CON 10) 10 MEQ tablet Take 10 mEq by mouth daily.   Yes [provider]  torsemide (DEMADEX) 10 MG tablet Take 10 mg by mouth daily.   Yes [provider]  traMADol-acetaminophen (ULTRACET) 37.5-325 MG tablet Take 1 tablet by mouth every 4 (four) hours as needed. for pain 03/06/17  Yes [provider]  TURMERIC PO Take 1,500 mg by mouth daily.   Yes [provider]  zinc gluconate 50 MG tablet Take 50 mg by mouth daily.   Yes [provider]    Family  History Family History  Problem Relation Age of Onset  . Hypothyroidism Mother   . Diabetes Maternal Grandmother   . Heart disease Maternal Grandfather     Social History Social History  Substance Use Topics  . Smoking status: Never Smoker  . Smokeless tobacco: Never Used  . Alcohol use No     Allergies   Levaquin [levofloxacin in d5w] and Penicillins   Review of Systems Review of Systems  Constitutional: Positive for fever. Negative for appetite change.  HENT: Negative for congestion and sore throat.   Respiratory: Negative for shortness of breath.   Cardiovascular: Negative for chest pain.  Gastrointestinal: Negative for abdominal pain, diarrhea and vomiting.  Genitourinary: Negative for dysuria, hematuria and pelvic pain.  Musculoskeletal: Negative for back pain.  Skin: Positive for wound. Negative for rash.  Neurological: Negative for syncope.  Psychiatric/Behavioral: Negative for confusion.     Physical Exam Updated Vital Signs BP 133/67   Pulse (!) 130   Temp 99.5 F (37.5 C) (Oral)   Resp 15   Ht 5' 1.5" (1.562 m)   Wt 71.7 kg (158 lb)   BMI 29.37 kg/m   Physical Exam  Constitutional: She appears well-developed and well-nourished.  HENT:  Head: Atraumatic.  Eyes: Pupils are equal, round, and reactive to light.  Neck: Neck supple.  Cardiovascular:  Tachycardia  Pulmonary/Chest: Effort normal. She has no wheezes.  Abdominal: There is no tenderness.  Colostomy in left lower quadrant. no tenderness.  Musculoskeletal: She exhibits edema.  Mild edema bilateral extremities. Stage IV decubitus ulcers. No drainage.  Neurological: She is alert.  Chronic paraplegia  Skin: Skin is warm. Capillary refill takes less than 2 seconds.  Psychiatric: She has a normal mood and affect.     ED Treatments / Results  Labs (all labs ordered are listed, but only abnormal results are displayed) Labs Reviewed  COMPREHENSIVE METABOLIC PANEL - Abnormal; Notable for  the following:       Result Value   Glucose, Bld 145 (*)    BUN 21 (*)    Calcium 10.8 (*)    Albumin 3.4 (*)    ALT 12 (*)    Total Bilirubin 1.4 (*)    All other components within normal limits  CBC WITH DIFFERENTIAL/PLATELET - Abnormal; Notable for the following:    WBC 10.6 (*)    Hemoglobin 11.8 (*)    MCV 77.3 (*)    MCH 24.4 (*)    RDW 15.9 (*)    All other components within normal limits  PROTIME-INR - Abnormal; Notable for the following:    Prothrombin Time 15.6 (*)    All other components within normal limits  CULTURE, BLOOD (ROUTINE X 2)  CULTURE, BLOOD (ROUTINE X 2)  URINE CULTURE  URINALYSIS, ROUTINE W REFLEX MICROSCOPIC  I-STAT CG4 LACTIC ACID, ED  I-STAT CG4 LACTIC  ACID, ED    EKG  EKG Interpretation None       Radiology Dg Chest 2 View  Result Date: 04/04/2017 CLINICAL DATA:  Chronic coughing for the past couple months. Pt has tachycardia today. No SOB. No chest pains. Pt takes HTN meds. Pt is diabetic. Nonsmoker. No hx of asthma, COPD, or AFIB. Pt has CHF. EXAM: CHEST - 2 VIEW COMPARISON:  03/13/2016 FINDINGS: Lungs are clear.  Mild elevation of the left diaphragmatic leaflet. Heart size and mediastinal contours are within normal limits. No effusion. Visualized bones unremarkable. IMPRESSION: No acute cardiopulmonary disease. Electronically Signed   By: Lucrezia Europe M.D.   On: 04/04/2017 12:28    Procedures Procedures (including critical care time)  Medications Ordered in ED Medications  cefTRIAXone (ROCEPHIN) 1 g in dextrose 5 % 50 mL IVPB (not administered)  sodium chloride 0.9 % bolus 1,000 mL (0 mLs Intravenous Stopped 04/04/17 1438)  cefTRIAXone (ROCEPHIN) 2 g in dextrose 5 % 50 mL IVPB (0 g Intravenous Stopped 04/04/17 1424)  sodium chloride 0.9 % bolus 1,000 mL (1,000 mLs Intravenous New Bag/Given 04/04/17 1445)     Initial Impression / Assessment and Plan / ED Course  I have reviewed the triage vital signs and the nursing notes.  Pertinent labs &  imaging results that were available during my care of the patient were reviewed by me and considered in my medical decision making (see chart for details).     Patient presents with fever and tachycardia. Chronic paraplegia. Chronic Foley catheter and large to his ulcer. Overall to keep his ulcer does not look that bad. Urine has become more cloudy. Still awaiting on the urinalysis to come back however empirically treated with Rocephin. She lives at home. Initial lactic acid is normal. Has had 2 L saline bolus. Heart rate is mildly improved. Appears to be a sinus tachycardia.Patient has had no hypotension or elevated lactate to indicate septic shock at this time. Care turned over to Dr Regenia Skeeter pending Urinalysis  CRITICAL CARE Performed by: Mackie Pai Total critical care time: 30 minutes Critical care time was exclusive of separately billable procedures and treating other patients. Critical care was necessary to treat or prevent imminent or life-threatening deterioration. Critical care was time spent personally by me on the following activities: development of treatment plan with patient and/or surrogate as well as nursing, discussions with consultants, evaluation of patient's response to treatment, examination of patient, obtaining history from patient or surrogate, ordering and performing treatments and interventions, ordering and review of laboratory studies, ordering and review of radiographic studies, pulse oximetry and re-evaluation of patient's condition.  Final Clinical Impressions(s) / ED Diagnoses   Final diagnoses:  Sepsis, due to unspecified organism [facility])    New Prescriptions New Prescriptions   No medications on file     Davonna Belling, MD 04/04/17 1551

## 2017-04-04 NOTE — H&P (Signed)
[facility] History and Physical   Patient: Sharon Warner VOZ:[id number]   PCP: [name], MD DOB: Mar 18, 1951   DOA: 04/04/2017   DOS: 04/04/2017   DOS: the patient was seen and examined on 04/04/2017  Patient coming from: The patient is coming from home.  Chief Complaint: Fever  HPI: Sharon Warner is a 66 y.o. female with Past medical history of paraplegia, neurogenic bladder, wheelchair-bound, chronic indwelling Foley catheter, sacral decubitus ulcer, morbid obesity, HTN, type II DM, sacral osteomyelitis, chronic diastolic CHF. Patient presented with complains of fever. In her usual health yesterday. This morning when she woke up and the home health nurse came to see her she had a temp of 101.4 and she was asked to go to ER. Denies having any complaints of chills, nausea, vomiting, abdominal pain, chest pain, headache, dizziness. No cough. No recent change in medications. Her Foley catheter is past due for a change for 2 days. She has a colostomy bag which was last emptied on Wednesday. Denies any recent change in medication other than decreasing Lopressor which was done almost a month ago.  ED Course: Presented with fever, was febrile with tachycardia, UA was showing pyuria and patient was started on antibiotics and was referred for admission  At her baseline wheelchair-bound And is ependent for most of her ADL; does not manages her medication on her own.  Review of Systems: as mentioned in the history of present illness.  All other systems reviewed and are negative.  Past Medical History:  Diagnosis Date  . Chronic heart failure ([place])   . Chronic indwelling Foley catheter    last changed few days ago  . Diabetes mellitus without complication ([place])   . Enlarged thoracic aorta ([place]) 03/14/2016  . H/O paraplegia    ascending paralysis unclear etiology  . Hypertension   . Lumbar spondylosis   . Morbid obesity ([place])   . OSA on CPAP   . Sacral decubitus ulcer, stage IV  [facility])    Past Surgical History:  Procedure Laterality Date  . JOINT REPLACEMENT Right    Hip  . TONSILLECTOMY    . VAGINAL HYSTERECTOMY     Social History:  reports that she has never smoked. She has never used smokeless tobacco. She reports that she does not drink alcohol or use drugs.  Allergies  Allergen Reactions  . Levaquin [Levofloxacin In D5w] Nausea Only    Unknown childhood allergy  . Penicillins Other (See Comments)    Has patient had a PCN reaction causing immediate rash, facial/tongue/throat swelling, SOB or lightheadedness with hypotension: Unknown Has patient had a PCN reaction causing severe rash involving mucus membranes or skin necrosis: Unknown Has patient had a PCN reaction that required hospitalization: No Has patient had a PCN reaction occurring within the last 10 years: No If all of the above answers are "NO", then may proceed with Cephalosporin use.    Family History  Problem Relation Age of Onset  . Hypothyroidism Mother   . Diabetes Maternal Grandmother   . Heart disease Maternal Grandfather      Prior to Admission medications   Medication Sig Start Date End Date Taking? Authorizing Provider  aspirin EC 81 MG tablet Take 81 mg by mouth daily.   Yes [provider]  b complex vitamins tablet Take 1 tablet by mouth daily.   Yes [provider]  Biotin 10000 MCG TABS Take 1,000 mcg by mouth daily.   Yes [provider]  cholecalciferol (VITAMIN D) 1000  units tablet Take 2,000 Units by mouth daily.    Yes [provider]  Lactobacillus TABS 250 mg by mouth bid   Yes [provider]  Melatonin 3 MG TABS Take 3 mg by mouth as needed (sleep).    Yes [provider]  metoprolol succinate (TOPROL-XL) 25 MG 24 hr tablet Take 25 mg by mouth daily. 03/26/17  Yes [provider]  naproxen sodium (ANAPROX) 220 MG tablet Take 440 mg by mouth as needed (pain temperature).   Yes [provider]    potassium chloride (KLOR-CON 10) 10 MEQ tablet Take 10 mEq by mouth daily.   Yes [provider]  torsemide (DEMADEX) 10 MG tablet Take 10 mg by mouth daily.   Yes [provider]  traMADol-acetaminophen (ULTRACET) 37.5-325 MG tablet Take 1 tablet by mouth every 4 (four) hours as needed. for pain 03/06/17  Yes [provider]  TURMERIC PO Take 1,500 mg by mouth daily.   Yes [provider]  zinc gluconate 50 MG tablet Take 50 mg by mouth daily.   Yes [provider]    Physical Exam: Vitals:   04/04/17 1400 04/04/17 1430 04/04/17 1602 04/04/17 1724  BP: 132/67 133/67 127/69 109/69  Pulse:   (!) 140 (!) 136  Resp: (!) 26 15 (!) 22 (!) 23  Temp:   100.2 F (37.9 C) 99.9 F (37.7 C)  TempSrc:   Oral Oral  SpO2:   95% 95%  Weight:      Height:        General: Alert, Awake and Oriented to Time, Place and Person. Appear in mild distress, affect appropriate Eyes: PERRL, Conjunctiva normal ENT: Oral Mucosa clear moist. Neck: difficult to assess JVD, no Abnormal Mass Or lumps Cardiovascular: S1 and S2 Present, no Murmur, Peripheral Pulses Present Respiratory: normal respiratory effort, Bilateral Air entry equal and Decreased, no use of accessory muscle, basal Crackles, no wheezes Abdomen: Bowel Sound present, Soft and no tenderness,  Extremities: bilateral Pedal edema, no calf tenderness Neurologic: Grossly no focal neuro deficit. Bilaterally Equal motor strength upper extremities, chronic paraplegia  Labs on Admission:  CBC:  Recent Labs Lab 04/04/17 1242  WBC 10.6*  NEUTROABS 7.4  HGB 11.8*  HCT 37.4  MCV 77.3*  PLT 277   Basic Metabolic Panel:  Recent Labs Lab 04/04/17 1242  NA 135  K 4.3  CL 101  CO2 25  GLUCOSE 145*  BUN 21*  CREATININE 0.70  CALCIUM 10.8*   GFR: Estimated Creatinine Clearance: 65.2 mL/min (by C-G formula based on SCr of 0.7 mg/dL). Liver Function Tests:  Recent Labs Lab 04/04/17 1242  AST 34   ALT 12*  ALKPHOS 68  BILITOT 1.4*  PROT 7.2  ALBUMIN 3.4*   No results for input(s): LIPASE, AMYLASE in the last 168 hours. No results for input(s): AMMONIA in the last 168 hours. Coagulation Profile:  Recent Labs Lab 04/04/17 1242  INR 1.24   Cardiac Enzymes: No results for input(s): CKTOTAL, CKMB, CKMBINDEX, TROPONINI in the last 168 hours. BNP (last 3 results) No results for input(s): PROBNP in the last 8760 hours. HbA1C: No results for input(s): HGBA1C in the last 72 hours. CBG: No results for input(s): GLUCAP in the last 168 hours. Lipid Profile: No results for input(s): CHOL, HDL, LDLCALC, TRIG, CHOLHDL, LDLDIRECT in the last 72 hours. Thyroid Function Tests: No results for input(s): TSH, T4TOTAL, FREET4, T3FREE, THYROIDAB in the last 72 hours. Anemia Panel: No results for input(s): VITAMINB12,  FOLATE, FERRITIN, TIBC, IRON, RETICCTPCT in the last 72 hours. Urine analysis:    Component Value Date/Time   COLORURINE YELLOW 04/04/2017 1543   APPEARANCEUR CLOUDY (A) 04/04/2017 1543   LABSPEC 1.013 04/04/2017 1543   PHURINE 6.0 04/04/2017 1543   GLUCOSEU NEGATIVE 04/04/2017 1543   HGBUR MODERATE (A) 04/04/2017 1543   BILIRUBINUR NEGATIVE 04/04/2017 [place] 04/04/2017 1543   PROTEINUR 30 (A) 04/04/2017 1543   NITRITE NEGATIVE 04/04/2017 1543   LEUKOCYTESUR LARGE (A) 04/04/2017 1543    Radiological Exams on Admission: Dg Chest 2 View  Result Date: 04/04/2017 CLINICAL DATA:  Chronic coughing for the past couple months. Pt has tachycardia today. No SOB. No chest pains. Pt takes HTN meds. Pt is diabetic. Nonsmoker. No hx of asthma, COPD, or AFIB. Pt has CHF. EXAM: CHEST - 2 VIEW COMPARISON:  03/13/2016 FINDINGS: Lungs are clear.  Mild elevation of the left diaphragmatic leaflet. Heart size and mediastinal contours are within normal limits. No effusion. Visualized bones unremarkable. IMPRESSION: No acute cardiopulmonary disease. Electronically Signed   By:  Lucrezia Europe M.D.   On: 04/04/2017 12:28   EKG: Independently reviewed. sinus tachycardia.  Assessment/Plan 1. Sepsis ([place]) With fever, leukocytosis, sinus tachycardia and evidence of pyuria the patient is meeting sepsis criteria. Last year in June 2017 patient had polymicrobial bacteremia as well as sacral osteomyelitis and was treated with ceftriaxone. At present I suspect that the patient primarily have sepsis due to UTI will change her Foley catheter. Follow blood cultures. Wound care will be consulted for sacral ulcer, patient continues to receive home health care and wound care at home. Continue aggressive IV hydration.  2. Sinus tachycardia. Patient has heart rate persistently in the range of 130s and 140s, sinus on telemetry. This likely in the setting of sepsis. Continue Lopressor. Check TSH. If the heart rate does not improve will require further workup including echocardiogram. With patient's paraplegia PE as a cause of sinus tachycardia cannot be ruled out but patient does not have any hypoxia or chest pain. Get ultrasound Doppler of lower extremity to rule out DVT due to edema.  3. Chronic diastolic heart failure. Suspecting that the patient has chronic diastolic heart failure, no echocardiogram available in our system. Currently on torsemide. In the setting of sepsis holding diuresis. Monitor volume status.  4. Hypercalcemia. Apparently patient has chronic hypercalcemia. Etiology remains unclear. Patient on vitamin D supplementation at home currently holding. Getting IV hydration at present.  5. Type 2 diabetes mellitus. On sensitive sliding scale. No medications at home.  6. OSA. Continue CPAP Daily at bedtime.  Nutrition: Cardiac and carb modified diet DVT Prophylaxis: subcutaneous Heparin  Advance goals of care discussion: full code   Consults: none  Family Communication: no family was present at bedside, at the time of interview.  Disposition: Admitted as  inpatient, step-down unit. Likely to be discharged home, in 3-4 day.  Author: Berle Mull, MD [facility] Pager: [phone] 04/04/2017  If 7PM-7AM, please contact night-coverage [email or web address] Password [email or web address]

## 2017-04-04 NOTE — ED Triage Notes (Addendum)
Pt presents to ED via EMS with c/o fever 101.92F and HR-129. Pt is bed bound with chronic foley catheter and wound vac on back. No change in mental status. Pt resides at home and home with home health.

## 2017-04-04 NOTE — ED Notes (Signed)
Bed: WA03 Expected date:  Expected time:  Means of arrival:  Comments: EMS-fever 

## 2017-04-04 NOTE — Progress Notes (Signed)
PHARMACY NOTE -  ANTIBIOTIC RENAL DOSE ADJUSTMENT   Request received for Pharmacy to assist with antibiotic renal dose adjustment.  Patient has been initiated on Rocephin for UTI. SCr 0.70, estimated CrCl 65 ml/min Current dosage is appropriate and need for further dosage adjustment appears unlikely at present. Will sign off at this time.  Please reconsult if a change in clinical status warrants re-evaluation of dosage.  Thank you,  Minda Ditto PharmD Pager [phone] 04/04/2017, 6:03 PM

## 2017-04-04 NOTE — ED Notes (Signed)
Multiple attempts made to get second set of blood cultures unsuccessfully. Antibiotics started so to not delay patient care

## 2017-04-04 NOTE — Progress Notes (Signed)
Patient has order for CPAP, patient states she does not wear one at home anymore and she does not want to wear one here. RT will continue to monitor.

## 2017-04-04 NOTE — ED Provider Notes (Signed)
4:43 PM D/w Dr. Posey Pronto, c/w UTI. Steva Ready, MD 04/04/17 [phone]

## 2017-04-05 ENCOUNTER — Inpatient Hospital Stay (HOSPITAL_COMMUNITY): Payer: BLUE CROSS/BLUE SHIELD

## 2017-04-05 DIAGNOSIS — I5032 Chronic diastolic (congestive) heart failure: Secondary | ICD-10-CM

## 2017-04-05 LAB — BLOOD CULTURE ID PANEL (REFLEXED)
Acinetobacter baumannii: NOT DETECTED
CANDIDA GLABRATA: NOT DETECTED
CANDIDA KRUSEI: NOT DETECTED
CARBAPENEM RESISTANCE: NOT DETECTED
Candida albicans: NOT DETECTED
Candida parapsilosis: NOT DETECTED
Candida tropicalis: NOT DETECTED
ENTEROBACTERIACEAE SPECIES: DETECTED — AB
ENTEROCOCCUS SPECIES: NOT DETECTED
ESCHERICHIA COLI: NOT DETECTED
Enterobacter cloacae complex: NOT DETECTED
Haemophilus influenzae: NOT DETECTED
KLEBSIELLA OXYTOCA: NOT DETECTED
Klebsiella pneumoniae: NOT DETECTED
LISTERIA MONOCYTOGENES: NOT DETECTED
Neisseria meningitidis: NOT DETECTED
PSEUDOMONAS AERUGINOSA: NOT DETECTED
Proteus species: DETECTED — AB
STAPHYLOCOCCUS AUREUS BCID: NOT DETECTED
STREPTOCOCCUS AGALACTIAE: DETECTED — AB
STREPTOCOCCUS PNEUMONIAE: NOT DETECTED
STREPTOCOCCUS PYOGENES: NOT DETECTED
Serratia marcescens: NOT DETECTED
Staphylococcus species: NOT DETECTED
Streptococcus species: DETECTED — AB

## 2017-04-05 LAB — GLUCOSE, CAPILLARY
GLUCOSE-CAPILLARY: 123 mg/dL — AB (ref 65–99)
Glucose-Capillary: 134 mg/dL — ABNORMAL HIGH (ref 65–99)
Glucose-Capillary: 114 mg/dL — ABNORMAL HIGH (ref 65–99)

## 2017-04-05 LAB — COMPREHENSIVE METABOLIC PANEL
ALBUMIN: 2.7 g/dL — AB (ref 3.5–5.0)
ALK PHOS: 56 U/L (ref 38–126)
ALT: 14 U/L (ref 14–54)
AST: 22 U/L (ref 15–41)
Anion gap: 9 (ref 5–15)
BUN: 19 mg/dL (ref 6–20)
CALCIUM: 9.5 mg/dL (ref 8.9–10.3)
CO2: 20 mmol/L — AB (ref 22–32)
CREATININE: 0.6 mg/dL (ref 0.44–1.00)
Chloride: 105 mmol/L (ref 101–111)
GFR calc Af Amer: >60 mL/min (ref >60)
GFR calc non Af Amer: >60 mL/min (ref >60)
GLUCOSE: 141 mg/dL — AB (ref 65–99)
Potassium: 3.3 mmol/L — ABNORMAL LOW (ref 3.5–5.1)
SODIUM: 134 mmol/L — AB (ref 135–145)
Total Bilirubin: 0.6 mg/dL (ref 0.3–1.2)
Total Protein: 5.7 g/dL — ABNORMAL LOW (ref 6.5–8.1)

## 2017-04-05 LAB — CBC WITH DIFFERENTIAL/PLATELET
BASOS PCT: 0 %
Basophils Absolute: 0 10*3/uL (ref 0.0–0.1)
EOS ABS: 0 10*3/uL (ref 0.0–0.7)
Eosinophils Relative: 0 %
HCT: 29.3 % — ABNORMAL LOW (ref 36.0–46.0)
HEMOGLOBIN: 9.2 g/dL — AB (ref 12.0–15.0)
LYMPHS ABS: 1.6 10*3/uL (ref 0.7–4.0)
Lymphocytes Relative: 14 %
MCH: 24.1 pg — AB (ref 26.0–34.0)
MCHC: 31.4 g/dL (ref 30.0–36.0)
MCV: 76.9 fL — ABNORMAL LOW (ref 78.0–100.0)
Monocytes Absolute: 0.9 10*3/uL (ref 0.1–1.0)
Monocytes Relative: 8 %
NEUTROS PCT: 79 %
Neutro Abs: 9 10*3/uL — ABNORMAL HIGH (ref 1.7–7.7)
Platelets: 281 10*3/uL (ref 150–400)
RBC: 3.81 MIL/uL — AB (ref 3.87–5.11)
RDW: 15.9 % — ABNORMAL HIGH (ref 11.5–15.5)
WBC: 11.5 10*3/uL — AB (ref 4.0–10.5)

## 2017-04-05 LAB — PTH, INTACT AND CALCIUM
CALCIUM TOTAL (PTH): 9.9 mg/dL (ref 8.7–10.3)
PTH: 41 pg/mL (ref 15–65)

## 2017-04-05 LAB — C-REACTIVE PROTEIN: CRP: 17.2 mg/dL — ABNORMAL HIGH (ref <1.0)

## 2017-04-05 LAB — URINE CULTURE

## 2017-04-05 LAB — FERRITIN: Ferritin: 516 ng/mL — ABNORMAL HIGH (ref 11–307)

## 2017-04-05 LAB — IRON AND TIBC
Iron: 9 ug/dL — ABNORMAL LOW (ref 28–170)
Saturation Ratios: 6 % — ABNORMAL LOW (ref 10.4–31.8)
TIBC: 151 ug/dL — ABNORMAL LOW (ref 250–450)
UIBC: 142 ug/dL

## 2017-04-05 LAB — T4, FREE: FREE T4: 1.08 ng/dL (ref 0.61–1.12)

## 2017-04-05 LAB — HIV ANTIBODY (ROUTINE TESTING W REFLEX): HIV SCREEN 4TH GENERATION: NONREACTIVE

## 2017-04-05 LAB — PROCALCITONIN: Procalcitonin: 0.61 ng/mL

## 2017-04-05 LAB — MAGNESIUM: Magnesium: 1.4 mg/dL — ABNORMAL LOW (ref 1.7–2.4)

## 2017-04-05 LAB — CORTISOL: Cortisol, Plasma: 17.8 ug/dL

## 2017-04-05 LAB — LACTIC ACID, PLASMA: Lactic Acid, Venous: 0.7 mmol/L (ref 0.5–1.9)

## 2017-04-05 MED ORDER — IOPAMIDOL (ISOVUE-370) INJECTION 76%
INTRAVENOUS | Status: AC
  Filled 2017-04-05: qty 100

## 2017-04-05 MED ORDER — POTASSIUM CHLORIDE CRYS ER 20 MEQ PO TBCR
40.0000 meq | EXTENDED_RELEASE_TABLET | Freq: Once | ORAL | Status: AC
  Administered 2017-04-05: 40 meq via ORAL
  Filled 2017-04-05: qty 2

## 2017-04-05 MED ORDER — INSULIN ASPART 100 UNIT/ML ~~LOC~~ SOLN: 0.0000 [IU] | Freq: Every day | SUBCUTANEOUS | Status: DC

## 2017-04-05 MED ORDER — DEXTROSE 5 % IV SOLN
2.0000 g | INTRAVENOUS | Status: DC
  Administered 2017-04-06 – 2017-04-11 (×6): 2 g via INTRAVENOUS
  Filled 2017-04-05 (×7): qty 2

## 2017-04-05 MED ORDER — SODIUM CHLORIDE 0.9 % IV BOLUS (SEPSIS)
250.0000 mL | Freq: Once | INTRAVENOUS | Status: AC
  Administered 2017-04-05: 250 mL via INTRAVENOUS

## 2017-04-05 MED ORDER — SODIUM CHLORIDE 0.9 % IV BOLUS (SEPSIS)
500.0000 mL | Freq: Once | INTRAVENOUS | Status: AC
  Administered 2017-04-05: 500 mL via INTRAVENOUS

## 2017-04-05 MED ORDER — IOPAMIDOL (ISOVUE-370) INJECTION 76%: 100.0000 mL | Freq: Once | INTRAVENOUS | Status: DC | PRN

## 2017-04-05 MED ORDER — MAGNESIUM SULFATE 2 GM/50ML IV SOLN
2.0000 g | Freq: Once | INTRAVENOUS | Status: AC
  Administered 2017-04-05: 2 g via INTRAVENOUS
  Filled 2017-04-05: qty 50

## 2017-04-05 MED ORDER — SODIUM CHLORIDE 0.9 % IV BOLUS (SEPSIS)
1000.0000 mL | Freq: Once | INTRAVENOUS | Status: AC
  Administered 2017-04-05: 1000 mL via INTRAVENOUS

## 2017-04-05 MED ORDER — INSULIN ASPART 100 UNIT/ML ~~LOC~~ SOLN
0.0000 [IU] | Freq: Three times a day (TID) | SUBCUTANEOUS | Status: DC
  Administered 2017-04-05 – 2017-04-06 (×2): 2 [IU] via SUBCUTANEOUS
  Administered 2017-04-06: 3 [IU] via SUBCUTANEOUS
  Administered 2017-04-07: 2 [IU] via SUBCUTANEOUS
  Administered 2017-04-07: 3 [IU] via SUBCUTANEOUS
  Administered 2017-04-08 – 2017-04-12 (×5): 2 [IU] via SUBCUTANEOUS
  Administered 2017-04-12: 5 [IU] via SUBCUTANEOUS
  Administered 2017-04-13 (×2): 3 [IU] via SUBCUTANEOUS
  Administered 2017-04-13 – 2017-04-15 (×4): 2 [IU] via SUBCUTANEOUS

## 2017-04-05 NOTE — Progress Notes (Signed)
PHARMACY - PHYSICIAN COMMUNICATION CRITICAL VALUE ALERT - BLOOD CULTURE IDENTIFICATION (BCID)  Results for orders placed or performed during the hospital encounter of 04/04/17  Blood Culture ID Panel (Reflexed) (Collected: 04/04/2017 12:42 PM)  Result Value Ref Range   Enterococcus species NOT DETECTED NOT DETECTED   Listeria monocytogenes NOT DETECTED NOT DETECTED   Staphylococcus species NOT DETECTED NOT DETECTED   Staphylococcus aureus NOT DETECTED NOT DETECTED   Streptococcus species DETECTED (A) NOT DETECTED   Streptococcus agalactiae DETECTED (A) NOT DETECTED   Streptococcus pneumoniae NOT DETECTED NOT DETECTED   Streptococcus pyogenes NOT DETECTED NOT DETECTED   Acinetobacter baumannii NOT DETECTED NOT DETECTED   Enterobacteriaceae species DETECTED (A) NOT DETECTED   Enterobacter cloacae complex NOT DETECTED NOT DETECTED   Escherichia coli NOT DETECTED NOT DETECTED   Klebsiella oxytoca NOT DETECTED NOT DETECTED   Klebsiella pneumoniae NOT DETECTED NOT DETECTED   Proteus species DETECTED (A) NOT DETECTED   Serratia marcescens NOT DETECTED NOT DETECTED   Carbapenem resistance NOT DETECTED NOT DETECTED   Haemophilus influenzae NOT DETECTED NOT DETECTED   Neisseria meningitidis NOT DETECTED NOT DETECTED   Pseudomonas aeruginosa NOT DETECTED NOT DETECTED   Candida albicans NOT DETECTED NOT DETECTED   Candida glabrata NOT DETECTED NOT DETECTED   Candida krusei NOT DETECTED NOT DETECTED   Candida parapsilosis NOT DETECTED NOT DETECTED   Candida tropicalis NOT DETECTED NOT DETECTED    Name of physician (or Provider) Contacted: Dr Wyline Copas  Changes to prescribed antibiotics required: Continue ceftriaxone  2gm q24h  Doreene Eland, PharmD, BCPS.   Pager: [phone] 04/05/2017 2:44 PM

## 2017-04-05 NOTE — Progress Notes (Addendum)
Pt heart rate increased from a rate of 90s to 130s. Hypotension resolved so previously held metoprolol was given. Provider notified and orders received for an EKG and 500cc bolus. EEG complete and bolus infusing now. Will continue to monitor.

## 2017-04-05 NOTE — Progress Notes (Signed)
Patient BP 78/36 rechecked it manually 80/40. M.D. oncall informed. Ordered 230ml Bolus. Will continue to monitor.

## 2017-04-05 NOTE — Progress Notes (Signed)
PROGRESS NOTE    Sharon Warner  OZD:[id number] DOB: 11/06/1950 DOA: 04/04/2017 PCP: [name], MD    Brief Narrative:  66 y.o. female with Past medical history of paraplegia, neurogenic bladder, wheelchair-bound, chronic indwelling Foley catheter, sacral decubitus ulcer, morbid obesity, HTN, type II DM, sacral osteomyelitis, chronic diastolic CHF. Patient presented with complains of fever. In her usual health yesterday. This morning when she woke up and the home health nurse came to see her she had a temp of 101.4 and she was asked to go to ER. Denies having any complaints of chills, nausea, vomiting, abdominal pain, chest pain, headache, dizziness. No cough. No recent change in medications. Her Foley catheter is past due for a change for 2 days. She has a colostomy bag which was last emptied on Wednesday. Denies any recent change in medication other than decreasing Lopressor which was done almost a month ago.  ED Course: Presented with fever, was febrile with tachycardia, UA was showing pyuria and patient was started on antibiotics and was referred for admission  Assessment & Plan:   Principal Problem:   Sepsis ([place]) Active Problems:   Morbid obesity ([place])   HTN (hypertension)   Paraplegia ([facility])   Neurogenic bladder   Obstructive sleep apnea   Protein-calorie malnutrition ([facility])   Hypercalcemia   Chronic diastolic heart failure ([facility])   Sacral decubitus ulcer, stage IV ([place])  1. UTI with sepsis present on admission [facility]) -Pt presents with fever, leukocytosis, sinus tachycardia and evidence of pyuria the patient is meeting sepsis criteria. -Last year in June 2017 patient had polymicrobial bacteremia as well as sacral osteomyelitis and was treated with ceftriaxone. -foley cath changed at time of admission -cultures thus far pos for proteus and GBS, awaiting sensitivitie -Continue aggressive IV hydration.  2. Sinus tachycardia. -Pt presented with heart rate persistently  in the range of 130s and 140s, sinus on telemetry. -Likely sequela of presenting UTI with sepsis  3. Chronic diastolic heart failure. -Suspecting that the patient has chronic diastolic heart failure, no echocardiogram available in our system. -Patient had been on torsemide prior to hospital admission. -Given presenting sepsis, diuretics are on hold  4. Hypercalcemia. -Etiology remains unclear. -Home vitamin D supplementation on hold -Continue with IV hydration at present.  5. Type 2 diabetes mellitus. -On sensitive sliding scale. -Not on diabetic medications prior to admission  6. OSA. -Continue CPAP Daily at bedtime. -Stable at present  DVT prophylaxis: Lovenox subcutaneous Code Status: Full code Family Communication: Patient in room, family not at bedside Disposition Plan: Uncertain at this time  Consultants:     Procedures:     Antimicrobials: Anti-infectives    Start     Dose/Rate Route Frequency Ordered Stop   04/06/17 1400  cefTRIAXone (ROCEPHIN) 2 g in dextrose 5 % 50 mL IVPB     2 g 100 mL/hr over 30 Minutes Intravenous Every 24 hours 04/05/17 1417     04/05/17 1400  cefTRIAXone (ROCEPHIN) 1 g in dextrose 5 % 50 mL IVPB  Status:  Discontinued     1 g 100 mL/hr over 30 Minutes Intravenous Every 24 hours 04/04/17 1235 04/05/17 1417   04/04/17 1230  cefTRIAXone (ROCEPHIN) 2 g in dextrose 5 % 50 mL IVPB     2 g 100 mL/hr over 30 Minutes Intravenous  Once 04/04/17 1220 04/04/17 2213       Subjective: Without complaints at present  Objective: Vitals:   04/05/17 1100 04/05/17 1130 04/05/17 1200 04/05/17 1300  BP: Marland Kitchen)  133/59  123/60 (!) 81/48  Pulse: (!) 122  (!) 129 (!) 119  Resp: (!) 35  (!) 35 (!) 33  Temp:  (!) 100.5 F (38.1 C) 99.1 F (37.3 C)   TempSrc:  Oral Oral   SpO2: 97%  97% 98%  Weight:      Height:        Intake/Output Summary (Last 24 hours) at 04/05/17 1454 Last data filed at 04/05/17 1315  Gross per 24 hour  Intake           6013.75 ml  Output              620 ml  Net          5393.75 ml   Filed Weights   04/04/17 1152 04/04/17 2030 04/05/17 0500  Weight: 71.7 kg (158 lb) 72.1 kg (158 lb 15.2 oz) 73.2 kg (161 lb 6 oz)    Examination:  General exam: Appears calm and comfortable  Respiratory system: Clear to auscultation. Respiratory effort normal. Cardiovascular system: S1 & S2 heard, RRR. No JVD, murmurs, rubs, gallops or clicks. No pedal edema. Gastrointestinal system: Abdomen is nondistended, soft and nontender. No organomegaly or masses felt. Normal bowel sounds heard. Central nervous system: Alert and oriented. No focal neurological deficits. Extremities: Symmetric 5 x 5 power. Skin: No rashes, lesions  Psychiatry: Judgement and insight appear normal. Mood & affect appropriate.   Data Reviewed: I have personally reviewed following labs and imaging studies  CBC:  Recent Labs Lab 04/04/17 1242 04/05/17 0348  WBC 10.6* 11.5*  NEUTROABS 7.4 9.0*  HGB 11.8* 9.2*  HCT 37.4 29.3*  MCV 77.3* 76.9*  PLT 373 829   Basic Metabolic Panel:  Recent Labs Lab 04/04/17 1242 04/04/17 1907 04/05/17 0348  NA 135  --  134*  K 4.3  --  3.3*  CL 101  --  105  CO2 25  --  20*  GLUCOSE 145*  --  141*  BUN 21*  --  19  CREATININE 0.70  --  0.60  CALCIUM 10.8* 9.9 9.5  MG  --   --  1.4*   GFR: Estimated Creatinine Clearance: 65 mL/min (by C-G formula based on SCr of 0.6 mg/dL). Liver Function Tests:  Recent Labs Lab 04/04/17 1242 04/05/17 0348  AST 34 22  ALT 12* 14  ALKPHOS 68 56  BILITOT 1.4* 0.6  PROT 7.2 5.7*  ALBUMIN 3.4* 2.7*   No results for input(s): LIPASE, AMYLASE in the last 168 hours. No results for input(s): AMMONIA in the last 168 hours. Coagulation Profile:  Recent Labs Lab 04/04/17 1242  INR 1.24   Cardiac Enzymes: No results for input(s): CKTOTAL, CKMB, CKMBINDEX, TROPONINI in the last 168 hours. BNP (last 3 results) No results for input(s): PROBNP in the last 8760  hours. HbA1C: No results for input(s): HGBA1C in the last 72 hours. CBG:  Recent Labs Lab 04/05/17 1216  GLUCAP 134*   Lipid Profile: No results for input(s): CHOL, HDL, LDLCALC, TRIG, CHOLHDL, LDLDIRECT in the last 72 hours. Thyroid Function Tests:  Recent Labs  04/04/17 1907  TSH 1.500  FREET4 1.08   Anemia Panel:  Recent Labs  04/04/17 1907  FERRITIN 516*  TIBC 151*  IRON 9*   Sepsis Labs:  Recent Labs Lab 04/04/17 1251 04/04/17 1556 04/05/17 0719  PROCALCITON  --   --  0.61  LATICACIDVEN 1.89 0.73 0.7    Recent Results (from the past 240 hour(s))  Culture, blood (Routine  x 2)     Status: None (Preliminary result)   Collection Time: 04/04/17 12:42 PM  Result Value Ref Range Status   Specimen Description BLOOD BLOOD RIGHT ARM  Final   Special Requests   Final    BOTTLES DRAWN AEROBIC AND ANAEROBIC Blood Culture adequate volume   Culture  Setup Time   Final    GRAM POSITIVE COCCI IN PAIRS IN CHAINS GRAM NEGATIVE RODS AEROBIC BOTTLE ONLY CRITICAL RESULT CALLED TO, READ BACK BY AND VERIFIED WITH: A PHAM,PHARMD AT 1437 04/05/17 BY L BENFIELD Performed at [facility], [place] [address], [place], [place] [address]    Culture GRAM POSITIVE COCCI GRAM NEGATIVE RODS   Final   Report Status PENDING  Incomplete  Blood Culture ID Panel (Reflexed)     Status: Abnormal   Collection Time: 04/04/17 12:42 PM  Result Value Ref Range Status   Enterococcus species NOT DETECTED NOT DETECTED Final   Listeria monocytogenes NOT DETECTED NOT DETECTED Final   Staphylococcus species NOT DETECTED NOT DETECTED Final   Staphylococcus aureus NOT DETECTED NOT DETECTED Final   Streptococcus species DETECTED (A) NOT DETECTED Final    Comment: CRITICAL RESULT CALLED TO, READ BACK BY AND VERIFIED WITH: A PHAM,PHARMD AT 1437 04/05/17 BY L BENFIELD    Streptococcus agalactiae DETECTED (A) NOT DETECTED Final    Comment: CRITICAL RESULT CALLED TO, READ BACK BY AND VERIFIED WITH: A  PHAM,PHARMD AT 1437 04/05/17 BY L BENFIELD    Streptococcus pneumoniae NOT DETECTED NOT DETECTED Final   Streptococcus pyogenes NOT DETECTED NOT DETECTED Final   Acinetobacter baumannii NOT DETECTED NOT DETECTED Final   Enterobacteriaceae species DETECTED (A) NOT DETECTED Final    Comment: Enterobacteriaceae represent a large family of gram-negative bacteria, not a single organism. CRITICAL RESULT CALLED TO, READ BACK BY AND VERIFIED WITH: A PHAM,PHARMD AT 1437 04/05/17 BY L BENFIELD    Enterobacter cloacae complex NOT DETECTED NOT DETECTED Final   Escherichia coli NOT DETECTED NOT DETECTED Final   Klebsiella oxytoca NOT DETECTED NOT DETECTED Final   Klebsiella pneumoniae NOT DETECTED NOT DETECTED Final   Proteus species DETECTED (A) NOT DETECTED Final    Comment: CRITICAL RESULT CALLED TO, READ BACK BY AND VERIFIED WITH: A PHAM,PHARMD AT 1437 04/05/17 BY L BENFIELD    Serratia marcescens NOT DETECTED NOT DETECTED Final   Carbapenem resistance NOT DETECTED NOT DETECTED Final   Haemophilus influenzae NOT DETECTED NOT DETECTED Final   Neisseria meningitidis NOT DETECTED NOT DETECTED Final   Pseudomonas aeruginosa NOT DETECTED NOT DETECTED Final   Candida albicans NOT DETECTED NOT DETECTED Final   Candida glabrata NOT DETECTED NOT DETECTED Final   Candida krusei NOT DETECTED NOT DETECTED Final   Candida parapsilosis NOT DETECTED NOT DETECTED Final   Candida tropicalis NOT DETECTED NOT DETECTED Final    Comment: Performed at [facility], [place] [address] [place], [place] [address]  Culture, blood (Routine x 2)     Status: None (Preliminary result)   Collection Time: 04/04/17  6:54 PM  Result Value Ref Range Status   Specimen Description BLOOD LEFT ANTECUBITAL  Final   Special Requests IN PEDIATRIC BOTTLE Blood Culture adequate volume  Final   Culture   Final    NO GROWTH < 12 HOURS Performed at [place] [address], [place],  [address]    Report Status PENDING   Incomplete  MRSA PCR Screening     Status: None   Collection  Time: 04/04/17  6:58 PM  Result Value Ref Range Status   MRSA by PCR NEGATIVE NEGATIVE Final    Comment:        The GeneXpert MRSA Assay (FDA approved for NASAL specimens only), is one component of a comprehensive MRSA colonization surveillance program. It is not intended to diagnose MRSA infection nor to guide or monitor treatment for MRSA infections.      Radiology Studies: Dg Chest 2 View  Result Date: 04/04/2017 CLINICAL DATA:  Chronic coughing for the past couple months. Pt has tachycardia today. No SOB. No chest pains. Pt takes HTN meds. Pt is diabetic. Nonsmoker. No hx of asthma, COPD, or AFIB. Pt has CHF. EXAM: CHEST - 2 VIEW COMPARISON:  03/13/2016 FINDINGS: Lungs are clear.  Mild elevation of the left diaphragmatic leaflet. Heart size and mediastinal contours are within normal limits. No effusion. Visualized bones unremarkable. IMPRESSION: No acute cardiopulmonary disease. Electronically Signed   By: Lucrezia Europe M.D.   On: 04/04/2017 12:28    Scheduled Meds: . acidophilus  2 capsule Oral TID  . aspirin EC  81 mg Oral Daily  . enoxaparin (LOVENOX) injection  40 mg Subcutaneous Q24H  . insulin aspart  0-15 Units Subcutaneous TID WC  . insulin aspart  0-5 Units Subcutaneous QHS  . metoprolol succinate  25 mg Oral QPM  . sodium chloride flush  3 mL Intravenous Q12H   Continuous Infusions: . sodium chloride 125 mL/hr at 04/05/17 0500  . [START ON 04/06/2017] cefTRIAXone (ROCEPHIN)  IV    . magnesium sulfate 1 - 4 g bolus IVPB       LOS: 1 day   CHIU, Orpah Melter, MD [facility] Pager [phone]  If 7PM-7AM, please contact night-coverage [email or web address] Password [email or web address] 04/05/2017, 2:54 PM

## 2017-04-05 NOTE — Progress Notes (Signed)
After bolus done patient HR went up to 140-150's sustaining, temp 99.1. M.D. Informed. Ordered CT scan. Will continue to monitor.

## 2017-04-06 ENCOUNTER — Inpatient Hospital Stay (HOSPITAL_COMMUNITY): Payer: BLUE CROSS/BLUE SHIELD

## 2017-04-06 DIAGNOSIS — G8220 Paraplegia, unspecified: Secondary | ICD-10-CM

## 2017-04-06 LAB — CBC
HCT: 30.3 % — ABNORMAL LOW (ref 36.0–46.0)
Hemoglobin: 9.5 g/dL — ABNORMAL LOW (ref 12.0–15.0)
MCH: 24.1 pg — ABNORMAL LOW (ref 26.0–34.0)
MCHC: 31.4 g/dL (ref 30.0–36.0)
MCV: 76.7 fL — ABNORMAL LOW (ref 78.0–100.0)
PLATELETS: 258 10*3/uL (ref 150–400)
RBC: 3.95 MIL/uL (ref 3.87–5.11)
RDW: 16 % — AB (ref 11.5–15.5)
WBC: 9.5 10*3/uL (ref 4.0–10.5)

## 2017-04-06 LAB — BASIC METABOLIC PANEL
Anion gap: 6 (ref 5–15)
BUN: 20 mg/dL (ref 6–20)
CALCIUM: 9.7 mg/dL (ref 8.9–10.3)
CO2: 20 mmol/L — ABNORMAL LOW (ref 22–32)
CREATININE: 0.61 mg/dL (ref 0.44–1.00)
Chloride: 110 mmol/L (ref 101–111)
GFR calc Af Amer: >60 mL/min (ref >60)
GLUCOSE: 149 mg/dL — AB (ref 65–99)
POTASSIUM: 4.1 mmol/L (ref 3.5–5.1)
SODIUM: 136 mmol/L (ref 135–145)

## 2017-04-06 LAB — GLUCOSE, CAPILLARY
GLUCOSE-CAPILLARY: 120 mg/dL — AB (ref 65–99)
Glucose-Capillary: 159 mg/dL — ABNORMAL HIGH (ref 65–99)
Glucose-Capillary: 124 mg/dL — ABNORMAL HIGH (ref 65–99)
Glucose-Capillary: 116 mg/dL — ABNORMAL HIGH (ref 65–99)

## 2017-04-06 LAB — TSH: TSH: 2.115 u[IU]/mL (ref 0.350–4.500)

## 2017-04-06 MED ORDER — METOPROLOL TARTRATE 5 MG/5ML IV SOLN
INTRAVENOUS | Status: AC
  Filled 2017-04-06: qty 5

## 2017-04-06 MED ORDER — LABETALOL HCL 5 MG/ML IV SOLN: 5.0000 mg | INTRAVENOUS | Status: DC | PRN

## 2017-04-06 MED ORDER — SODIUM CHLORIDE 0.9 % IV BOLUS (SEPSIS)
500.0000 mL | Freq: Once | INTRAVENOUS | Status: AC
  Administered 2017-04-06: 500 mL via INTRAVENOUS

## 2017-04-06 MED ORDER — SODIUM CHLORIDE 0.9 % IV BOLUS (SEPSIS)
250.0000 mL | Freq: Once | INTRAVENOUS | Status: AC
  Administered 2017-04-06: 250 mL via INTRAVENOUS

## 2017-04-06 NOTE — Progress Notes (Signed)
PROGRESS NOTE    Sharon Warner  IRW:[id number] DOB: September 16, 1951 DOA: 04/04/2017 PCP: [name], MD    Brief Narrative:  66 y.o. female with Past medical history of paraplegia, neurogenic bladder, wheelchair-bound, chronic indwelling Foley catheter, sacral decubitus ulcer, morbid obesity, HTN, type II DM, sacral osteomyelitis, chronic diastolic CHF. Patient presented with complains of fever. In her usual health yesterday. This morning when she woke up and the home health nurse came to see her she had a temp of 101.4 and she was asked to go to ER. Denies having any complaints of chills, nausea, vomiting, abdominal pain, chest pain, headache, dizziness. No cough. No recent change in medications. Her Foley catheter is past due for a change for 2 days. She has a colostomy bag which was last emptied on Wednesday. Denies any recent change in medication other than decreasing Lopressor which was done almost a month ago.  ED Course: Presented with fever, was febrile with tachycardia, UA was showing pyuria and patient was started on antibiotics and was referred for admission  Assessment & Plan:   Principal Problem:   Sepsis ([place]) Active Problems:   Morbid obesity ([place])   HTN (hypertension)   Paraplegia (H[facility])   Neurogenic bladder   Obstructive sleep apnea   Protein-calorie malnutrition (HCC)   Hypercalcemia   Chronic diastolic heart failure (H[facility])   Sacral decubitus ulcer, stage IV ([place])  1. UTI with sepsis present on admission [facility]) -Pt presents with fever, leukocytosis, sinus tachycardia and evidence of pyuria the patient is meeting sepsis criteria. -Last year in June 2017 patient had polymicrobial bacteremia as well as sacral osteomyelitis and was treated with ceftriaxone. -foley cath changed at time of admission -cultures thus far pos for proteus and GBS, awaiting sensitivities -Sepsis resolving with IVF hydration  2. Sinus tachycardia. -Pt presented with heart rate  persistently in the range of 130s and 140s, sinus on telemetry. -Likely sequela of presenting UTI with sepsis -Continue on beta blocker with hold parameters  3. Chronic diastolic heart failure. -Suspecting that the patient has chronic diastolic heart failure, no echocardiogram available in our system. -Patient had been on torsemide prior to hospital admission. -Given presenting sepsis, diuretics remain on hold  4. Hypercalcemia. -Etiology remains unclear. -Home vitamin D supplementation on hold -resolved  5. Type 2 diabetes mellitus. -On sensitive sliding scale. -Not on diabetic medications prior to admission -random glucose 149 this AM  6. OSA. -Continue CPAP Daily at bedtime. -Stable currently  DVT prophylaxis: Lovenox subcutaneous Code Status: Full code Family Communication: Patient in room, family not at bedside Disposition Plan: Uncertain at this time  Consultants:     Procedures:     Antimicrobials: Anti-infectives    Start     Dose/Rate Route Frequency Ordered Stop   04/06/17 1400  cefTRIAXone (ROCEPHIN) 2 g in dextrose 5 % 50 mL IVPB     2 g 100 mL/hr over 30 Minutes Intravenous Every 24 hours 04/05/17 1417     04/05/17 1400  cefTRIAXone (ROCEPHIN) 1 g in dextrose 5 % 50 mL IVPB  Status:  Discontinued     1 g 100 mL/hr over 30 Minutes Intravenous Every 24 hours 04/04/17 1235 04/05/17 1417   04/04/17 1230  cefTRIAXone (ROCEPHIN) 2 g in dextrose 5 % 50 mL IVPB     2 g 100 mL/hr over 30 Minutes Intravenous  Once 04/04/17 1220 04/04/17 2213      Subjective: No complaints this AM  Objective: Vitals:   04/06/17 0900 04/06/17 1100 04/06/17  1137 04/06/17 1200  BP: (!) 87/54 (!) 94/56  103/62  Pulse: (!) 124 (!) 106    Resp: (!) 37 (!) 32  (!) 23  Temp:   98 F (36.7 C)   TempSrc:   Oral   SpO2: 97% 95%  95%  Weight:      Height:        Intake/Output Summary (Last 24 hours) at 04/06/17 1215 Last data filed at 04/06/17 1200  Gross per 24 hour    Intake          4329.17 ml  Output             1280 ml  Net          3049.17 ml   Filed Weights   04/04/17 2030 04/05/17 0500 04/06/17 0600  Weight: 72.1 kg (158 lb 15.2 oz) 73.2 kg (161 lb 6 oz) 79.4 kg (175 lb 0.7 oz)    Examination: General exam: Awake, laying in bed, in nad Respiratory system: Normal respiratory effort, no wheezing Cardiovascular system: tachycardic, s1, s2 Gastrointestinal system: Soft, nondistended, positive BS Central nervous system: CN2-12 grossly intact, strength intact Extremities: Perfused, no clubbing Skin: Normal skin turgor, no notable skin lesions seen Psychiatry: Mood normal // no visual hallucinations    Data Reviewed: I have personally reviewed following labs and imaging studies  CBC:  Recent Labs Lab 04/04/17 1242 04/05/17 0348 04/06/17 0101  WBC 10.6* 11.5* 9.5  NEUTROABS 7.4 9.0*  --   HGB 11.8* 9.2* 9.5*  HCT 37.4 29.3* 30.3*  MCV 77.3* 76.9* 76.7*  PLT 373 281 782   Basic Metabolic Panel:  Recent Labs Lab 04/04/17 1242 04/04/17 1907 04/05/17 0348 04/06/17 0101  NA 135  --  134* 136  K 4.3  --  3.3* 4.1  CL 101  --  105 110  CO2 25  --  20* 20*  GLUCOSE 145*  --  141* 149*  BUN 21*  --  19 20  CREATININE 0.70  --  0.60 0.61  CALCIUM 10.8* 9.9 9.5 9.7  MG  --   --  1.4*  --    GFR: Estimated Creatinine Clearance: 67.7 mL/min (by C-G formula based on SCr of 0.61 mg/dL). Liver Function Tests:  Recent Labs Lab 04/04/17 1242 04/05/17 0348  AST 34 22  ALT 12* 14  ALKPHOS 68 56  BILITOT 1.4* 0.6  PROT 7.2 5.7*  ALBUMIN 3.4* 2.7*   No results for input(s): LIPASE, AMYLASE in the last 168 hours. No results for input(s): AMMONIA in the last 168 hours. Coagulation Profile:  Recent Labs Lab 04/04/17 1242  INR 1.24   Cardiac Enzymes: No results for input(s): CKTOTAL, CKMB, CKMBINDEX, TROPONINI in the last 168 hours. BNP (last 3 results) No results for input(s): PROBNP in the last 8760 hours. HbA1C: No  results for input(s): HGBA1C in the last 72 hours. CBG:  Recent Labs Lab 04/05/17 1216 04/05/17 1604 04/05/17 2107 04/06/17 0759 04/06/17 1110  GLUCAP 134* 114* 123* 120* 159*   Lipid Profile: No results for input(s): CHOL, HDL, LDLCALC, TRIG, CHOLHDL, LDLDIRECT in the last 72 hours. Thyroid Function Tests:  Recent Labs  04/04/17 1907 04/06/17 0101  TSH 1.500 2.115  FREET4 1.08  --    Anemia Panel:  Recent Labs  04/04/17 1907  FERRITIN 516*  TIBC 151*  IRON 9*   Sepsis Labs:  Recent Labs Lab 04/04/17 1251 04/04/17 1556 04/05/17 0719  PROCALCITON  --   --  0.61  LATICACIDVEN 1.89 0.73 0.7    Recent Results (from the past 240 hour(s))  Culture, blood (Routine x 2)     Status: Abnormal (Preliminary result)   Collection Time: 04/04/17 12:42 PM  Result Value Ref Range Status   Specimen Description BLOOD BLOOD RIGHT ARM  Final   Special Requests   Final    BOTTLES DRAWN AEROBIC AND ANAEROBIC Blood Culture adequate volume   Culture  Setup Time   Final    GRAM POSITIVE COCCI IN PAIRS IN CHAINS GRAM NEGATIVE RODS AEROBIC BOTTLE ONLY CRITICAL RESULT CALLED TO, READ BACK BY AND VERIFIED WITH: A PHAM,PHARMD AT 1437 04/05/17 BY L BENFIELD Performed at [facility], [place] [address] [place], [place] [address]    Culture PROTEUS MIRABILIS GRAM POSITIVE COCCI  (A)  Final   Report Status PENDING  Incomplete  Blood Culture ID Panel (Reflexed)     Status: Abnormal   Collection Time: 04/04/17 12:42 PM  Result Value Ref Range Status   Enterococcus species NOT DETECTED NOT DETECTED Final   Listeria monocytogenes NOT DETECTED NOT DETECTED Final   Staphylococcus species NOT DETECTED NOT DETECTED Final   Staphylococcus aureus NOT DETECTED NOT DETECTED Final   Streptococcus species DETECTED (A) NOT DETECTED Final    Comment: CRITICAL RESULT CALLED TO, READ BACK BY AND VERIFIED WITH: A PHAM,PHARMD AT 1437 04/05/17 BY L BENFIELD    Streptococcus agalactiae DETECTED (A)  NOT DETECTED Final    Comment: CRITICAL RESULT CALLED TO, READ BACK BY AND VERIFIED WITH: A PHAM,PHARMD AT 1437 04/05/17 BY L BENFIELD    Streptococcus pneumoniae NOT DETECTED NOT DETECTED Final   Streptococcus pyogenes NOT DETECTED NOT DETECTED Final   Acinetobacter baumannii NOT DETECTED NOT DETECTED Final   Enterobacteriaceae species DETECTED (A) NOT DETECTED Final    Comment: Enterobacteriaceae represent a large family of gram-negative bacteria, not a single organism. CRITICAL RESULT CALLED TO, READ BACK BY AND VERIFIED WITH: A PHAM,PHARMD AT 1437 04/05/17 BY L BENFIELD    Enterobacter cloacae complex NOT DETECTED NOT DETECTED Final   Escherichia coli NOT DETECTED NOT DETECTED Final   Klebsiella oxytoca NOT DETECTED NOT DETECTED Final   Klebsiella pneumoniae NOT DETECTED NOT DETECTED Final   Proteus species DETECTED (A) NOT DETECTED Final    Comment: CRITICAL RESULT CALLED TO, READ BACK BY AND VERIFIED WITH: A PHAM,PHARMD AT 1437 04/05/17 BY L BENFIELD    Serratia marcescens NOT DETECTED NOT DETECTED Final   Carbapenem resistance NOT DETECTED NOT DETECTED Final   Haemophilus influenzae NOT DETECTED NOT DETECTED Final   Neisseria meningitidis NOT DETECTED NOT DETECTED Final   Pseudomonas aeruginosa NOT DETECTED NOT DETECTED Final   Candida albicans NOT DETECTED NOT DETECTED Final   Candida glabrata NOT DETECTED NOT DETECTED Final   Candida krusei NOT DETECTED NOT DETECTED Final   Candida parapsilosis NOT DETECTED NOT DETECTED Final   Candida tropicalis NOT DETECTED NOT DETECTED Final    Comment: Performed at [name], [place] [address] [place],  [address]  Urine culture     Status: Abnormal   Collection Time: 04/04/17  3:43 PM  Result Value Ref Range Status   Specimen Description URINE, CATHETERIZED  Final   Special Requests NONE  Final   Culture MULTIPLE ORGANISMS PRESENT, NONE PREDOMINANT (A)  Final   Report Status 04/05/2017 FINAL  Final  Culture, blood (Routine x  2)     Status: None (Preliminary result)   Collection Time: 04/04/17  6:54 PM  Result Value Ref Range Status   Specimen Description BLOOD LEFT ANTECUBITAL  Final   Special Requests IN PEDIATRIC BOTTLE Blood Culture adequate volume  Final   Culture   Final    NO GROWTH 2 DAYS Performed at [facility], [place] [address], [place], [place] [address]    Report Status PENDING  Incomplete  MRSA PCR Screening     Status: None   Collection Time: 04/04/17  6:58 PM  Result Value Ref Range Status   MRSA by PCR NEGATIVE NEGATIVE Final    Comment:        The GeneXpert MRSA Assay (FDA approved for NASAL specimens only), is one component of a comprehensive MRSA colonization surveillance program. It is not intended to diagnose MRSA infection nor to guide or monitor treatment for MRSA infections.      Radiology Studies: Dg Chest 2 View  Result Date: 04/04/2017 CLINICAL DATA:  Chronic coughing for the past couple months. Pt has tachycardia today. No SOB. No chest pains. Pt takes HTN meds. Pt is diabetic. Nonsmoker. No hx of asthma, COPD, or AFIB. Pt has CHF. EXAM: CHEST - 2 VIEW COMPARISON:  03/13/2016 FINDINGS: Lungs are clear.  Mild elevation of the left diaphragmatic leaflet. Heart size and mediastinal contours are within normal limits. No effusion. Visualized bones unremarkable. IMPRESSION: No acute cardiopulmonary disease. Electronically Signed   By: Lucrezia Europe M.D.   On: 04/04/2017 12:28   Ct Angio Chest Pe W Or Wo Contrast  Result Date: 04/06/2017 CLINICAL DATA:  Tachycardia.  Sepsis, urinary tract infection. EXAM: CT ANGIOGRAPHY CHEST WITH CONTRAST TECHNIQUE: Multidetector CT imaging of the chest was performed using the standard protocol during bolus administration of intravenous contrast. Multiplanar CT image reconstructions and MIPs were obtained to evaluate the vascular anatomy. CONTRAST:  75 cc Isovue 370 IV COMPARISON:  Radiograph yesterday CT 03/13/2016 FINDINGS: Cardiovascular: There  are no filling defects within the pulmonary arteries to suggest pulmonary embolus to the proximal segmental level. Subsegmental branches cannot be assessed due to contrast bolus timing and breathing motion artifact. Again seen dilatation of main pulmonary artery measuring 4.1 cm. Unchanged aneurysmal dilatation of the ascending aorta maximal dimension 4.2 cm. No aortic dissection. Borderline mild cardiomegaly. Minimal pericardial fluid/thickening. Mediastinum/Nodes: No mediastinal hilar adenopathy. The esophagus is decompressed. There is heterogeneous low-density enlargement of the thyroid gland with calcifications on the left, unchanged in CT appearance. Lungs/Pleura: Breathing motion artifact limits detailed assessment. Elevated right hemidiaphragm with adjacent atelectasis and trace pleural effusion. Minimal left basilar atelectasis and pleural fluid/thickening. No consolidation to suggest pneumonia. No convincing pulmonary edema. No pneumothorax. Upper Abdomen: Calcified gallstones within the gallbladder. Liver appears prominent size with probable steatosis. Musculoskeletal: Minimal anterior wedging of T8 and T5 vertebral body that was not seen on prior exam. Scoliotic curvature of the thoracic spine. Review of the MIP images confirms the above findings. IMPRESSION: 1. No pulmonary embolus to the proximal segmental level. Again seen dilatation of the main pulmonary artery suggesting pulmonary arterial hypertension. 2. Unchanged 4.2 cm aneurysmal dilatation of the ascending aorta. Recommend annual imaging followup by CTA or MRA. This recommendation follows 2010 ACCF/AHA/AATS/ACR/ASA/SCA/SCAI/SIR/STS/SVM Guidelines for the Diagnosis and Management of Patients with Thoracic Aortic Disease. Circulation. 2010; 121: I786-V672 3. Elevated right hemidiaphragm with adjacent atelectasis and small pleural effusion. Minimal left basilar atelectasis and pleural fluid. 4. Mild compression fractures of T5 and T8 vertebral  bodies, age indeterminate but new from June 2017. 5. Cholelithiasis, the gallbladder is incompletely imaged. 6. Enlargement of  the thyroid gland low-density, probable nodules. This is unchanged in CT appearance. Consider nonemergent ultrasound characterization. Electronically Signed   By: Jeb Levering M.D.   On: 04/06/2017 00:35    Scheduled Meds: . acidophilus  2 capsule Oral TID  . aspirin EC  81 mg Oral Daily  . enoxaparin (LOVENOX) injection  40 mg Subcutaneous Q24H  . insulin aspart  0-15 Units Subcutaneous TID WC  . insulin aspart  0-5 Units Subcutaneous QHS  . metoprolol succinate  25 mg Oral QPM  . sodium chloride flush  3 mL Intravenous Q12H   Continuous Infusions: . sodium chloride 125 mL/hr at 04/06/17 0930  . cefTRIAXone (ROCEPHIN)  IV       LOS: 2 days   CHIU, Orpah Melter, MD [facility] Pager [phone]  If 7PM-7AM, please contact night-coverage [email or web address] Password [email or web address] 04/06/2017, 12:15 PM

## 2017-04-06 NOTE — Progress Notes (Signed)
Patient HR remains 140-150's. MD informed. 573ml bolus ordered. Will continue to monitor

## 2017-04-06 NOTE — Progress Notes (Signed)
*  PRELIMINARY RESULTS* Vascular Ultrasound Bilateral lower extremity venous duplex has been completed.  Preliminary findings: No evidence of deep vein thrombosis in the visualized veins of the lower extremities.  Somewhat difficult exam due to small caliber vessels and difficult visualization.  Negative for baker's cysts bilaterally.  Multiple hypoechoic areas seen in left groin, ?lymph nodes.  Sharon Warner 04/06/2017, 10:11 AM

## 2017-04-06 NOTE — Progress Notes (Signed)
Patient requested a nasal cannula to help her rest.  She states that it helps her sleep.  Patient placed on a nasal cannula at 2l/[place].Marland Kitchen  Patient with O2 sat of 93-95% on room air..  Continue to monitor patient closely. Hudson Majkowski Roselie Awkward RN

## 2017-04-06 NOTE — Progress Notes (Signed)
500 ml bolus done. M.D. Informed. HR remains 130-140's.  No new orders. Will continue to monitor

## 2017-04-06 NOTE — Progress Notes (Signed)
Pt refused cpap tonight stating it was too uncomfortable.  Cpap remains in room on standby.  Pt was advised that RT is available all night and encouraged her to call, should she change her mind.  Rn aware.

## 2017-04-07 ENCOUNTER — Inpatient Hospital Stay (HOSPITAL_COMMUNITY): Payer: BLUE CROSS/BLUE SHIELD

## 2017-04-07 LAB — CBC
HEMATOCRIT: 29.3 % — AB (ref 36.0–46.0)
Hemoglobin: 9.1 g/dL — ABNORMAL LOW (ref 12.0–15.0)
MCH: 23.4 pg — ABNORMAL LOW (ref 26.0–34.0)
MCHC: 31.1 g/dL (ref 30.0–36.0)
MCV: 75.3 fL — ABNORMAL LOW (ref 78.0–100.0)
Platelets: 254 10*3/uL (ref 150–400)
RBC: 3.89 MIL/uL (ref 3.87–5.11)
RDW: 16.4 % — AB (ref 11.5–15.5)
WBC: 8 10*3/uL (ref 4.0–10.5)

## 2017-04-07 LAB — GLUCOSE, CAPILLARY
GLUCOSE-CAPILLARY: 90 mg/dL (ref 65–99)
Glucose-Capillary: 122 mg/dL — ABNORMAL HIGH (ref 65–99)
Glucose-Capillary: 151 mg/dL — ABNORMAL HIGH (ref 65–99)
Glucose-Capillary: 191 mg/dL — ABNORMAL HIGH (ref 65–99)

## 2017-04-07 LAB — BASIC METABOLIC PANEL
Anion gap: 5 (ref 5–15)
BUN: 19 mg/dL (ref 6–20)
CHLORIDE: 114 mmol/L — AB (ref 101–111)
CO2: 16 mmol/L — ABNORMAL LOW (ref 22–32)
Calcium: 9.5 mg/dL (ref 8.9–10.3)
Creatinine, Ser: 0.54 mg/dL (ref 0.44–1.00)
GFR calc Af Amer: >60 mL/min (ref >60)
GFR calc non Af Amer: >60 mL/min (ref >60)
GLUCOSE: 139 mg/dL — AB (ref 65–99)
POTASSIUM: 3.7 mmol/L (ref 3.5–5.1)
Sodium: 135 mmol/L (ref 135–145)

## 2017-04-07 LAB — PROCALCITONIN: Procalcitonin: 1.55 ng/mL

## 2017-04-07 MED ORDER — FLUTICASONE PROPIONATE 50 MCG/ACT NA SUSP
2.0000 | Freq: Every day | NASAL | Status: DC
  Administered 2017-04-07 – 2017-04-16 (×10): 2 via NASAL
  Filled 2017-04-07: qty 16

## 2017-04-07 MED ORDER — ENSURE ENLIVE PO LIQD
237.0000 mL | Freq: Two times a day (BID) | ORAL | Status: DC
  Administered 2017-04-07 – 2017-04-16 (×15): 237 mL via ORAL

## 2017-04-07 MED ORDER — ADULT MULTIVITAMIN W/MINERALS CH
1.0000 | ORAL_TABLET | Freq: Every day | ORAL | Status: DC
  Administered 2017-04-07 – 2017-04-16 (×10): 1 via ORAL
  Filled 2017-04-07 (×10): qty 1

## 2017-04-07 MED ORDER — FUROSEMIDE 10 MG/ML IJ SOLN
40.0000 mg | Freq: Once | INTRAMUSCULAR | Status: AC
  Administered 2017-04-07: 40 mg via INTRAVENOUS
  Filled 2017-04-07: qty 4

## 2017-04-07 MED ORDER — METOPROLOL SUCCINATE ER 25 MG PO TB24
12.5000 mg | ORAL_TABLET | Freq: Every evening | ORAL | Status: DC
  Administered 2017-04-07: 12.5 mg via ORAL
  Filled 2017-04-07: qty 1

## 2017-04-07 NOTE — Progress Notes (Signed)
PROGRESS NOTE    Sharon Warner  BJY:[id number] DOB: 1951/05/15 DOA: 04/04/2017 PCP: [name], MD    Brief Narrative:  66 y.o. female with Past medical history of paraplegia, neurogenic bladder, wheelchair-bound, chronic indwelling Foley catheter, sacral decubitus ulcer, morbid obesity, HTN, type II DM, sacral osteomyelitis, chronic diastolic CHF. Patient presented with complains of fever. In her usual health yesterday. This morning when she woke up and the home health nurse came to see her she had a temp of 101.4 and she was asked to go to ER. Denies having any complaints of chills, nausea, vomiting, abdominal pain, chest pain, headache, dizziness. No cough. No recent change in medications. Her Foley catheter is past due for a change for 2 days. She has a colostomy bag which was last emptied on Wednesday. Denies any recent change in medication other than decreasing Lopressor which was done almost a month ago.  ED Course: Presented with fever, was febrile with tachycardia, UA was showing pyuria and patient was started on antibiotics and was referred for admission  Assessment & Plan:   Principal Problem:   Sepsis ([place]) Active Problems:   Morbid obesity ([place])   HTN (hypertension)   Paraplegia (HCC)   Neurogenic bladder   Obstructive sleep apnea   Protein-calorie malnutrition (H[facility])   Hypercalcemia   Chronic diastolic heart failure (HCC)   Sacral decubitus ulcer, stage IV ([place])  1. UTI with sepsis present on admission [facility]) -Pt presents with fever, leukocytosis, sinus tachycardia and evidence of pyuria the patient is meeting sepsis criteria. -Last year in June 2017 patient had polymicrobial bacteremia as well as sacral osteomyelitis and was treated with ceftriaxone. -foley cath changed at time of admission -cultures thus far pos for proteus and GBS, awaiting sensitivities -Lactic acid within normal limits -Sepsis physiology much improved -Given concerns of volume overload,  will hold further IV fluids for the time being  2. Sinus tachycardia. -Pt presented with heart rate persistently in the range of 130s and 140s, sinus on telemetry. -Likely sequela of presenting UTI with sepsis versus rebound tachycardia after holding beta blocker secondary to hypertension -Given soft blood pressure, will decrease dose of metoprolol. Plan to continue beta blocker as tolerated  3.  acute on Chronic diastolic heart failure. -Suspecting that the patient has chronic diastolic heart failure, no echocardiogram available in our system. -Patient had been on torsemide prior to hospital admission. -Given presenting sepsis, diuretics were initially held and patient was continued on aggressive IV fluid hydration. Patient is clinically volume overloaded. Chest x-ray reviewed, with findings of developing pulmonary edema -Per above, will hold further IV fluids as blood pressure tolerates -Plus blood pressure improves, consider starting diuretics  4. Hypercalcemia. -Etiology remains unclear. -Home vitamin D supplementation on hold -Calcium levels normalized  5. Type 2 diabetes mellitus. -On sensitive sliding scale. -Not on diabetic medications prior to admission -Labs reviewed. Overall stable  6. OSA. -Continue CPAP Daily at bedtime. -Presently stable  DVT prophylaxis: Lovenox subcutaneous Code Status: Full code Family Communication: Patient in room, family not at bedside Disposition Plan: Uncertain at this time  Consultants:     Procedures:     Antimicrobials: Anti-infectives    Start     Dose/Rate Route Frequency Ordered Stop   04/06/17 1400  cefTRIAXone (ROCEPHIN) 2 g in dextrose 5 % 50 mL IVPB     2 g 100 mL/hr over 30 Minutes Intravenous Every 24 hours 04/05/17 1417     04/05/17 1400  cefTRIAXone (ROCEPHIN) 1 g in dextrose  5 % 50 mL IVPB  Status:  Discontinued     1 g 100 mL/hr over 30 Minutes Intravenous Every 24 hours 04/04/17 1235 04/05/17 1417    04/04/17 1230  cefTRIAXone (ROCEPHIN) 2 g in dextrose 5 % 50 mL IVPB     2 g 100 mL/hr over 30 Minutes Intravenous  Once 04/04/17 1220 04/04/17 2213      Subjective: Patient is without complaints this morning  Objective: Vitals:   04/07/17 0830 04/07/17 0900 04/07/17 1000 04/07/17 1100  BP: 93/67 (!) 86/61 (!) 87/54 (!) 81/54  Pulse: (!) 104 100 (!) 106 (!) 105  Resp: (!) 30 (!) 25 (!) 29 (!) 23  Temp:      TempSrc:      SpO2: 100% 97% 100% 100%  Weight:      Height:        Intake/Output Summary (Last 24 hours) at 04/07/17 1201 Last data filed at 04/07/17 1125  Gross per 24 hour  Intake          3826.25 ml  Output             1151 ml  Net          2675.25 ml   Filed Weights   04/05/17 0500 04/06/17 0600 04/07/17 0500  Weight: 73.2 kg (161 lb 6 oz) 79.4 kg (175 lb 0.7 oz) 84.6 kg (186 lb 8.2 oz)    Examination: General exam: Conversant, Mildly increased respiratory effort  Respiratory system: normal chest rise, clear, no audible wheezing Cardiovascular system: regular rhythm, s1-s2 Gastrointestinal system: Nondistended, nontender, pos BS Central nervous system: No seizures, no tremors Extremities: No cyanosis, no joint deformities Skin: No rashes, no pallor Psychiatry: Affect normal // no auditory hallucinations    Data Reviewed: I have personally reviewed following labs and imaging studies  CBC:  Recent Labs Lab 04/04/17 1242 04/05/17 0348 04/06/17 0101 04/07/17 0310  WBC 10.6* 11.5* 9.5 8.0  NEUTROABS 7.4 9.0*  --   --   HGB 11.8* 9.2* 9.5* 9.1*  HCT 37.4 29.3* 30.3* 29.3*  MCV 77.3* 76.9* 76.7* 75.3*  PLT 373 281 258 902   Basic Metabolic Panel:  Recent Labs Lab 04/04/17 1242 04/04/17 1907 04/05/17 0348 04/06/17 0101 04/07/17 0310  NA 135  --  134* 136 135  K 4.3  --  3.3* 4.1 3.7  CL 101  --  105 110 114*  CO2 25  --  20* 20* 16*  GLUCOSE 145*  --  141* 149* 139*  BUN 21*  --  19 20 19   CREATININE 0.70  --  0.60 0.61 0.54  CALCIUM 10.8*  9.9 9.5 9.7 9.5  MG  --   --  1.4*  --   --    GFR: Estimated Creatinine Clearance: 70.1 mL/min (by C-G formula based on SCr of 0.54 mg/dL). Liver Function Tests:  Recent Labs Lab 04/04/17 1242 04/05/17 0348  AST 34 22  ALT 12* 14  ALKPHOS 68 56  BILITOT 1.4* 0.6  PROT 7.2 5.7*  ALBUMIN 3.4* 2.7*   No results for input(s): LIPASE, AMYLASE in the last 168 hours. No results for input(s): AMMONIA in the last 168 hours. Coagulation Profile:  Recent Labs Lab 04/04/17 1242  INR 1.24   Cardiac Enzymes: No results for input(s): CKTOTAL, CKMB, CKMBINDEX, TROPONINI in the last 168 hours. BNP (last 3 results) No results for input(s): PROBNP in the last 8760 hours. HbA1C: No results for input(s): HGBA1C in the last 72 hours.  CBG:  Recent Labs Lab 04/06/17 1110 04/06/17 1600 04/06/17 2118 04/07/17 0738 04/07/17 1147  GLUCAP 159* 124* 116* 122* 90   Lipid Profile: No results for input(s): CHOL, HDL, LDLCALC, TRIG, CHOLHDL, LDLDIRECT in the last 72 hours. Thyroid Function Tests:  Recent Labs  04/04/17 1907 04/06/17 0101  TSH 1.500 2.115  FREET4 1.08  --    Anemia Panel:  Recent Labs  04/04/17 1907  FERRITIN 516*  TIBC 151*  IRON 9*   Sepsis Labs:  Recent Labs Lab 04/04/17 1251 04/04/17 1556 04/05/17 0719 04/07/17 0310  PROCALCITON  --   --  0.61 1.55  LATICACIDVEN 1.89 0.73 0.7  --     Recent Results (from the past 240 hour(s))  Culture, blood (Routine x 2)     Status: Abnormal (Preliminary result)   Collection Time: 04/04/17 12:42 PM  Result Value Ref Range Status   Specimen Description BLOOD BLOOD RIGHT ARM  Final   Special Requests   Final    BOTTLES DRAWN AEROBIC AND ANAEROBIC Blood Culture adequate volume   Culture  Setup Time   Final    GRAM POSITIVE COCCI IN PAIRS IN CHAINS GRAM NEGATIVE RODS AEROBIC BOTTLE ONLY CRITICAL RESULT CALLED TO, READ BACK BY AND VERIFIED WITH: A PHAM,PHARMD AT 1437 04/05/17 BY L BENFIELD Performed at [facility], [place] [address], [place], [place] [address]    Culture PROTEUS MIRABILIS GRAM POSITIVE COCCI  (A)  Final   Report Status PENDING  Incomplete  Blood Culture ID Panel (Reflexed)     Status: Abnormal   Collection Time: 04/04/17 12:42 PM  Result Value Ref Range Status   Enterococcus species NOT DETECTED NOT DETECTED Final   Listeria monocytogenes NOT DETECTED NOT DETECTED Final   Staphylococcus species NOT DETECTED NOT DETECTED Final   Staphylococcus aureus NOT DETECTED NOT DETECTED Final   Streptococcus species DETECTED (A) NOT DETECTED Final    Comment: CRITICAL RESULT CALLED TO, READ BACK BY AND VERIFIED WITH: A PHAM,PHARMD AT 1437 04/05/17 BY L BENFIELD    Streptococcus agalactiae DETECTED (A) NOT DETECTED Final    Comment: CRITICAL RESULT CALLED TO, READ BACK BY AND VERIFIED WITH: A PHAM,PHARMD AT 1437 04/05/17 BY L BENFIELD    Streptococcus pneumoniae NOT DETECTED NOT DETECTED Final   Streptococcus pyogenes NOT DETECTED NOT DETECTED Final   Acinetobacter baumannii NOT DETECTED NOT DETECTED Final   Enterobacteriaceae species DETECTED (A) NOT DETECTED Final    Comment: Enterobacteriaceae represent a large family of gram-negative bacteria, not a single organism. CRITICAL RESULT CALLED TO, READ BACK BY AND VERIFIED WITH: A PHAM,PHARMD AT 1437 04/05/17 BY L BENFIELD    Enterobacter cloacae complex NOT DETECTED NOT DETECTED Final   Escherichia coli NOT DETECTED NOT DETECTED Final   Klebsiella oxytoca NOT DETECTED NOT DETECTED Final   Klebsiella pneumoniae NOT DETECTED NOT DETECTED Final   Proteus species DETECTED (A) NOT DETECTED Final    Comment: CRITICAL RESULT CALLED TO, READ BACK BY AND VERIFIED WITH: A PHAM,PHARMD AT 1437 04/05/17 BY L BENFIELD    Serratia marcescens NOT DETECTED NOT DETECTED Final   Carbapenem resistance NOT DETECTED NOT DETECTED Final   Haemophilus influenzae NOT DETECTED NOT DETECTED Final   Neisseria meningitidis NOT DETECTED NOT DETECTED Final    Pseudomonas aeruginosa NOT DETECTED NOT DETECTED Final   Candida albicans NOT DETECTED NOT DETECTED Final   Candida glabrata NOT DETECTED NOT DETECTED Final   Candida krusei NOT DETECTED NOT DETECTED Final   Candida parapsilosis  NOT DETECTED NOT DETECTED Final   Candida tropicalis NOT DETECTED NOT DETECTED Final    Comment: Performed at [facility], [place] [address], [place], [place] [address]  Urine culture     Status: Abnormal   Collection Time: 04/04/17  3:43 PM  Result Value Ref Range Status   Specimen Description URINE, CATHETERIZED  Final   Special Requests NONE  Final   Culture MULTIPLE ORGANISMS PRESENT, NONE PREDOMINANT (A)  Final   Report Status 04/05/2017 FINAL  Final  Culture, blood (Routine x 2)     Status: None (Preliminary result)   Collection Time: 04/04/17  6:54 PM  Result Value Ref Range Status   Specimen Description BLOOD LEFT ANTECUBITAL  Final   Special Requests IN PEDIATRIC BOTTLE Blood Culture adequate volume  Final   Culture   Final    NO GROWTH 3 DAYS Performed at [facility], [place] [address] [place], [place] [address]    Report Status PENDING  Incomplete  MRSA PCR Screening     Status: None   Collection Time: 04/04/17  6:58 PM  Result Value Ref Range Status   MRSA by PCR NEGATIVE NEGATIVE Final    Comment:        The GeneXpert MRSA Assay (FDA approved for NASAL specimens only), is one component of a comprehensive MRSA colonization surveillance program. It is not intended to diagnose MRSA infection nor to guide or monitor treatment for MRSA infections.      Radiology Studies: Dg Chest 1 View  Result Date: 04/07/2017 CLINICAL DATA:  Shortness of breath EXAM: CHEST 1 VIEW COMPARISON:  04/04/2017 FINDINGS: Cardiomegaly with central vascular congestion and perihilar interstitial opacities suspicious for mild edema. No significant effusion. Mild atelectasis at the right base. No pneumothorax. IMPRESSION: Mild cardiomegaly with central  vascular congestion and mild interstitial pulmonary edema. Atelectasis at the right lung base. Electronically Signed   By: Donavan Foil M.D.   On: 04/07/2017 00:32   Ct Angio Chest Pe W Or Wo Contrast  Result Date: 04/06/2017 CLINICAL DATA:  Tachycardia.  Sepsis, urinary tract infection. EXAM: CT ANGIOGRAPHY CHEST WITH CONTRAST TECHNIQUE: Multidetector CT imaging of the chest was performed using the standard protocol during bolus administration of intravenous contrast. Multiplanar CT image reconstructions and MIPs were obtained to evaluate the vascular anatomy. CONTRAST:  75 cc Isovue 370 IV COMPARISON:  Radiograph yesterday CT 03/13/2016 FINDINGS: Cardiovascular: There are no filling defects within the pulmonary arteries to suggest pulmonary embolus to the proximal segmental level. Subsegmental branches cannot be assessed due to contrast bolus timing and breathing motion artifact. Again seen dilatation of main pulmonary artery measuring 4.1 cm. Unchanged aneurysmal dilatation of the ascending aorta maximal dimension 4.2 cm. No aortic dissection. Borderline mild cardiomegaly. Minimal pericardial fluid/thickening. Mediastinum/Nodes: No mediastinal hilar adenopathy. The esophagus is decompressed. There is heterogeneous low-density enlargement of the thyroid gland with calcifications on the left, unchanged in CT appearance. Lungs/Pleura: Breathing motion artifact limits detailed assessment. Elevated right hemidiaphragm with adjacent atelectasis and trace pleural effusion. Minimal left basilar atelectasis and pleural fluid/thickening. No consolidation to suggest pneumonia. No convincing pulmonary edema. No pneumothorax. Upper Abdomen: Calcified gallstones within the gallbladder. Liver appears prominent size with probable steatosis. Musculoskeletal: Minimal anterior wedging of T8 and T5 vertebral body that was not seen on prior exam. Scoliotic curvature of the thoracic spine. Review of the MIP images confirms the  above findings. IMPRESSION: 1. No pulmonary embolus to the proximal segmental level. Again seen dilatation of the  main pulmonary artery suggesting pulmonary arterial hypertension. 2. Unchanged 4.2 cm aneurysmal dilatation of the ascending aorta. Recommend annual imaging followup by CTA or MRA. This recommendation follows 2010 ACCF/AHA/AATS/ACR/ASA/SCA/SCAI/SIR/STS/SVM Guidelines for the Diagnosis and Management of Patients with Thoracic Aortic Disease. Circulation. 2010; 121: Z329-J242 3. Elevated right hemidiaphragm with adjacent atelectasis and small pleural effusion. Minimal left basilar atelectasis and pleural fluid. 4. Mild compression fractures of T5 and T8 vertebral bodies, age indeterminate but new from June 2017. 5. Cholelithiasis, the gallbladder is incompletely imaged. 6. Enlargement of the thyroid gland low-density, probable nodules. This is unchanged in CT appearance. Consider nonemergent ultrasound characterization. Electronically Signed   By: Jeb Levering M.D.   On: 04/06/2017 00:35   Dg Chest Port 1 View  Result Date: 04/07/2017 CLINICAL DATA:  SOB. Hx of HTN and DM. EXAM: PORTABLE CHEST 1 VIEW COMPARISON:  04/07/2017 FINDINGS: Heart is enlarged. There are perihilar infiltrates. More focal opacity is identified at the lung bases, partially obscuring the hemidiaphragms. Findings are consistent with pulmonary edema, similar in appearance to exam earlier today. IMPRESSION: Congestive heart failure. Electronically Signed   By: Nolon Nations M.D.   On: 04/07/2017 08:22    Scheduled Meds: . acidophilus  2 capsule Oral TID  . aspirin EC  81 mg Oral Daily  . enoxaparin (LOVENOX) injection  40 mg Subcutaneous Q24H  . feeding supplement (ENSURE ENLIVE)  237 mL Oral BID BM  . insulin aspart  0-15 Units Subcutaneous TID WC  . insulin aspart  0-5 Units Subcutaneous QHS  . metoprolol succinate  12.5 mg Oral QPM  . multivitamin with minerals  1 tablet Oral Daily  . sodium chloride flush  3 mL  Intravenous Q12H   Continuous Infusions: . sodium chloride 10 mL/hr at 04/07/17 1035  . cefTRIAXone (ROCEPHIN)  IV Stopped (04/06/17 1346)     LOS: 3 days   Izeyah Deike, Orpah Melter, MD [facility] Pager [phone]  If 7PM-7AM, please contact night-coverage [email or web address] Password [email or web address] 04/07/2017, 12:01 PM

## 2017-04-07 NOTE — Progress Notes (Signed)
Patient refuses CPAP for tonight.  

## 2017-04-07 NOTE — Progress Notes (Signed)
Initial Nutrition Assessment  DOCUMENTATION CODES:   Obesity unspecified  INTERVENTION:   -Provide Ensure Enlive po BID, each supplement provides 350 kcal and 20 grams of protein -Provide Multivitamin with minerals daily -Encourage PO intake -RD to continue to monitor  NUTRITION DIAGNOSIS:   Increased nutrient needs related to wound healing as evidenced by estimated needs.  GOAL:   Patient will meet greater than or equal to 90% of their needs  MONITOR:   PO intake, Supplement acceptance, Labs, Weight trends, Skin, I & O's  REASON FOR ASSESSMENT:   Low Braden    ASSESSMENT:   66 y.o. female with Past medical history of paraplegia, neurogenic bladder, wheelchair-bound, chronic indwelling Foley catheter, sacral decubitus ulcer, morbid obesity, HTN, type II DM, sacral osteomyelitis, chronic diastolic CHF.  Patient in room with no family at bedside. Pt with hoarse voice. Pt reports poor appetite which started not that long before she was admitted. Pt denies issues with chewing or swallowing food. States foods taste bland to her. Pt was ordered an omelet for breakfast but she didn't eat it d/t it not having any "seasoning". PO intakes have ranged from 40-75% since admission.  Pt states she drinks Ensure supplements at home. RD will order supplement and MVI d/t stage IV wound.  Per chart review, pt has lost 68 lb since 06/27/16 (30% wt loss x 9 months, significant for time frame). Nutrition focused physical exam shows no sign of depletion of muscle mass or body fat. Pt is wheelchair bound and paraplegic.  Medications reviewed. Labs reviewed: CBG:  122  Diet Order:  Diet Carb Modified Fluid consistency: Thin; Room service appropriate? Yes  Skin:  Wound (see comment) (Pressure ulcer: Stg IV sacrum)  Last BM:  7/8  Height:   Ht Readings from Last 1 Encounters:  04/04/17 5\' 1"  (1.549 m)    Weight:   Wt Readings from Last 1 Encounters:  04/07/17 186 lb 8.2 oz (84.6 kg)     Ideal Body Weight:  45.3 kg (adjusted for paraplegia)  BMI:  Body mass index is 35.24 kg/m.  Estimated Nutritional Needs:   Kcal:  2000-2200  Protein:  100-110g  Fluid:  2L/day  EDUCATION NEEDS:   Education needs addressed  Clayton Bibles, MS, RD, LDN Pager: [phone] After Hours Pager: [phone]

## 2017-04-07 NOTE — Consult Note (Signed)
[facility] Nurse wound consult note Reason for Consult: Chronic stage 4 pressure injury to sacrum, present on admission.  Wound type:stage 4 pressure injury Pressure Injury POA: Yes Measurement: 6 cm x 4 cm x 2.4 cm  Wound BEE:FEOF pink, friable and nongranulating.   Drainage (amount, consistency, odor) Had NS moist gauze in place.   Periwound:intact Dressing procedure/placement/frequency:Due to microbial bioburden, will begin Aquacel AG, silver hydrofiber for absorption.   CLeanse wound with NS and pat dry.  Fill wound depth with Aquacel Ag.  Cover with ABD pad and tape.  Change Mon/wed/Fri.   [facility] Nurse ostomy consult note Stoma type/location: LLQ colostomy, flush.  Stomal assessment/size: 1 3/8", slightly oval Peristomal assessment: intact Treatment options for stomal/peristomal skin: barrier ring due to flush stoma and abdominal girth. Output brown stool in pouch Ostomy pouching: 1pc.flat with barrier ring.  Education provided: Spouse at bedside.  Educated that opening should be cut to fit around stoma.  Was cut significantly larger and no barrier ring in place.  Discussed rationale for barrier ring.   Enrolled patient in [place] program: No Will not follow at this time.  Please re-consult if needed.  Domenic Moras RN BSN [facility] Pager [phone]

## 2017-04-08 ENCOUNTER — Inpatient Hospital Stay (HOSPITAL_COMMUNITY): Payer: BLUE CROSS/BLUE SHIELD

## 2017-04-08 LAB — BASIC METABOLIC PANEL
ANION GAP: 7 (ref 5–15)
BUN: 20 mg/dL (ref 6–20)
CALCIUM: 9.9 mg/dL (ref 8.9–10.3)
CHLORIDE: 110 mmol/L (ref 101–111)
CO2: 20 mmol/L — AB (ref 22–32)
CREATININE: 0.52 mg/dL (ref 0.44–1.00)
GFR calc non Af Amer: >60 mL/min (ref >60)
Glucose, Bld: 151 mg/dL — ABNORMAL HIGH (ref 65–99)
Potassium: 3.6 mmol/L (ref 3.5–5.1)
SODIUM: 137 mmol/L (ref 135–145)

## 2017-04-08 LAB — GLUCOSE, CAPILLARY
GLUCOSE-CAPILLARY: 123 mg/dL — AB (ref 65–99)
GLUCOSE-CAPILLARY: 127 mg/dL — AB (ref 65–99)
Glucose-Capillary: 137 mg/dL — ABNORMAL HIGH (ref 65–99)
Glucose-Capillary: 99 mg/dL (ref 65–99)

## 2017-04-08 LAB — CBC
HCT: 31.5 % — ABNORMAL LOW (ref 36.0–46.0)
HEMOGLOBIN: 9.7 g/dL — AB (ref 12.0–15.0)
MCH: 23.7 pg — AB (ref 26.0–34.0)
MCHC: 30.8 g/dL (ref 30.0–36.0)
MCV: 77 fL — ABNORMAL LOW (ref 78.0–100.0)
PLATELETS: 285 10*3/uL (ref 150–400)
RBC: 4.09 MIL/uL (ref 3.87–5.11)
RDW: 16.6 % — ABNORMAL HIGH (ref 11.5–15.5)
WBC: 10.4 10*3/uL (ref 4.0–10.5)

## 2017-04-08 LAB — MAGNESIUM: Magnesium: 1.8 mg/dL (ref 1.7–2.4)

## 2017-04-08 MED ORDER — FUROSEMIDE 10 MG/ML IJ SOLN
40.0000 mg | Freq: Once | INTRAMUSCULAR | Status: AC
  Administered 2017-04-08: 40 mg via INTRAVENOUS
  Filled 2017-04-08: qty 4

## 2017-04-08 MED ORDER — MAGNESIUM SULFATE 2 GM/50ML IV SOLN
2.0000 g | Freq: Once | INTRAVENOUS | Status: AC
Start: 2017-04-08 — End: 2017-04-08
  Administered 2017-04-08: 2 g via INTRAVENOUS
  Filled 2017-04-08: qty 50

## 2017-04-08 MED ORDER — IOPAMIDOL (ISOVUE-300) INJECTION 61%
INTRAVENOUS | Status: AC
  Administered 2017-04-08: 19:00:00
  Filled 2017-04-08: qty 75

## 2017-04-08 MED ORDER — IOPAMIDOL (ISOVUE-300) INJECTION 61%
75.0000 mL | Freq: Once | INTRAVENOUS | Status: AC | PRN
  Administered 2017-04-08: 75 mL via INTRAVENOUS

## 2017-04-08 MED ORDER — ORAL CARE MOUTH RINSE
15.0000 mL | Freq: Two times a day (BID) | OROMUCOSAL | Status: DC
  Administered 2017-04-08 – 2017-04-16 (×17): 15 mL via OROMUCOSAL

## 2017-04-08 MED ORDER — POTASSIUM CHLORIDE 10 MEQ/100ML IV SOLN
10.0000 meq | INTRAVENOUS | Status: AC
  Administered 2017-04-08 (×4): 10 meq via INTRAVENOUS
  Filled 2017-04-08 (×4): qty 100

## 2017-04-08 NOTE — Progress Notes (Signed)
Pt HR dropped 30s-40s upon transfer to CT table. Remained in 69s for ~2 minutes then returned to 100-110s. Pt drowsy, but oriented and communicating. BP stable.

## 2017-04-08 NOTE — Progress Notes (Addendum)
PROGRESS NOTE    Sharon Warner  YIR:[id number] DOB: 11-Mar-1951 DOA: 04/04/2017 PCP: [name], MD    Brief Narrative:  66 y.o. female with Past medical history of paraplegia, neurogenic bladder, wheelchair-bound, chronic indwelling Foley catheter, sacral decubitus ulcer, morbid obesity, HTN, type II DM, sacral osteomyelitis, chronic diastolic CHF. Patient presented with complains of fever. In her usual health yesterday. This morning when she woke up and the home health nurse came to see her she had a temp of 101.4 and she was asked to go to ER. Denies having any complaints of chills, nausea, vomiting, abdominal pain, chest pain, headache, dizziness. No cough. No recent change in medications. Her Foley catheter is past due for a change for 2 days. She has a colostomy bag which was last emptied on Wednesday. Denies any recent change in medication other than decreasing Lopressor which was done almost a month ago.  ED Course: Presented with fever, was febrile with tachycardia, UA was showing pyuria and patient was started on antibiotics and was referred for admission  Assessment & Plan:   Principal Problem:   Sepsis ([place]) Active Problems:   Morbid obesity ([place])   HTN (hypertension)   Paraplegia ([facility])   Neurogenic bladder   Obstructive sleep apnea   Protein-calorie malnutrition ([facility])   Hypercalcemia   Chronic diastolic heart failure ([facility])   Sacral decubitus ulcer, stage IV ([place])  1. UTI with sepsis present on admission [facility]) likely associated with foley cath -Pt presents with fever, leukocytosis, sinus tachycardia and evidence of pyuria the patient is meeting sepsis criteria. -Last year in June 2017 patient had polymicrobial bacteremia as well as sacral osteomyelitis and was treated with ceftriaxone. -foley cath changed at time of admission -cultures thus far pos for proteus and strep species. Proteus is pan-sensitive -Patient is continued on rocephin -Lactic acid within  normal limits -Sepsis physiology resolving -BP improved and pt with signs of volume overload. Holding further IVF and diuresing as tolerated  2. Sinus tachycardia/bradycardia. -Pt presented with heart rate persistently in the range of 130s and 140s, sinus on telemetry. -Likely sequela of presenting UTI with sepsis versus rebound tachycardia after holding beta blocker secondary to hypertension -Tachycardia resolved with resumption of beta blocker, however on 7/10, patient noted to be transiently profoundly bradycardic -Will check repeat Mg level  3.  acute on Chronic diastolic heart failure. -Suspecting that the patient has chronic diastolic heart failure, no echocardiogram available in our system. -Patient had been on torsemide prior to hospital admission. -Given presenting sepsis, diuretics were initially held and patient was continued on aggressive IV fluid hydration. Patient is clinically volume overloaded. Chest x-ray reviewed, with findings of developing pulmonary edema -Per above, IVF now on hold and diuresing cautiously as tolerated  4. Hypercalcemia. -Etiology remains unclear. -Home vitamin D supplementation on hold -Calcium levels have normalized  5. Type 2 diabetes mellitus. -On sensitive sliding scale. -Not on diabetic medications prior to admission -Labs reviewed. Currently stable  6. OSA. -Continue CPAP Daily at bedtime. -Currently stable  7. Hoarse voice -Unclear etiology -Pt denies coughing, sore throat -Question laryngitis  -Continue to monitor -If no improvement, consider soft tissue neck CT  DVT prophylaxis: Lovenox subcutaneous Code Status: Full code Family Communication: Patient in room, family not at bedside Disposition Plan: Uncertain at this time  Consultants:     Procedures:     Antimicrobials: Anti-infectives    Start     Dose/Rate Route Frequency Ordered Stop   04/06/17 1400  cefTRIAXone (ROCEPHIN) 2  g in dextrose 5 % 50 mL IVPB      2 g 100 mL/hr over 30 Minutes Intravenous Every 24 hours 04/05/17 1417     04/05/17 1400  cefTRIAXone (ROCEPHIN) 1 g in dextrose 5 % 50 mL IVPB  Status:  Discontinued     1 g 100 mL/hr over 30 Minutes Intravenous Every 24 hours 04/04/17 1235 04/05/17 1417   04/04/17 1230  cefTRIAXone (ROCEPHIN) 2 g in dextrose 5 % 50 mL IVPB     2 g 100 mL/hr over 30 Minutes Intravenous  Once 04/04/17 1220 04/04/17 2213      Subjective: Complains of hoarse voice  Objective: Vitals:   04/08/17 1200 04/08/17 1201 04/08/17 1202 04/08/17 1229  BP: (!) 151/128 (!) 159/89    Pulse: 97 (!) 0 97 93  Resp: (!) 30 18 (!) 28 (!) 25  Temp:   97.6 F (36.4 C)   TempSrc:   Oral   SpO2: 100% 99% 99% 100%  Weight:      Height:        Intake/Output Summary (Last 24 hours) at 04/08/17 1340 Last data filed at 04/08/17 1200  Gross per 24 hour  Intake              410 ml  Output             2971 ml  Net            -2561 ml   Filed Weights   04/06/17 0600 04/07/17 0500 04/08/17 0500  Weight: 79.4 kg (175 lb 0.7 oz) 84.6 kg (186 lb 8.2 oz) 81.4 kg (179 lb 7.3 oz)    Examination: General exam: Awake, laying in bed, in nad, hoarse voice Respiratory system: Normal respiratory effort, no wheezing Cardiovascular system: regular rate, s1, s2 Gastrointestinal system: Soft, nondistended, positive BS Central nervous system: CN2-12 grossly intact, strength intact Extremities: Perfused, no clubbing Skin: Normal skin turgor, no notable skin lesions seen Psychiatry: Mood normal // no visual hallucinations    Data Reviewed: I have personally reviewed following labs and imaging studies  CBC:  Recent Labs Lab 04/04/17 1242 04/05/17 0348 04/06/17 0101 04/07/17 0310 04/08/17 0318  WBC 10.6* 11.5* 9.5 8.0 10.4  NEUTROABS 7.4 9.0*  --   --   --   HGB 11.8* 9.2* 9.5* 9.1* 9.7*  HCT 37.4 29.3* 30.3* 29.3* 31.5*  MCV 77.3* 76.9* 76.7* 75.3* 77.0*  PLT 373 281 258 254 409   Basic Metabolic Panel:  Recent  Labs Lab 04/04/17 1242 04/04/17 1907 04/05/17 0348 04/06/17 0101 04/07/17 0310 04/08/17 0318  NA 135  --  134* 136 135 137  K 4.3  --  3.3* 4.1 3.7 3.6  CL 101  --  105 110 114* 110  CO2 25  --  20* 20* 16* 20*  GLUCOSE 145*  --  141* 149* 139* 151*  BUN 21*  --  19 20 19 20   CREATININE 0.70  --  0.60 0.61 0.54 0.52  CALCIUM 10.8* 9.9 9.5 9.7 9.5 9.9  MG  --   --  1.4*  --   --   --    GFR: Estimated Creatinine Clearance: 68.6 mL/min (by C-G formula based on SCr of 0.52 mg/dL). Liver Function Tests:  Recent Labs Lab 04/04/17 1242 04/05/17 0348  AST 34 22  ALT 12* 14  ALKPHOS 68 56  BILITOT 1.4* 0.6  PROT 7.2 5.7*  ALBUMIN 3.4* 2.7*   No results for input(s): LIPASE, AMYLASE  in the last 168 hours. No results for input(s): AMMONIA in the last 168 hours. Coagulation Profile:  Recent Labs Lab 04/04/17 1242  INR 1.24   Cardiac Enzymes: No results for input(s): CKTOTAL, CKMB, CKMBINDEX, TROPONINI in the last 168 hours. BNP (last 3 results) No results for input(s): PROBNP in the last 8760 hours. HbA1C: No results for input(s): HGBA1C in the last 72 hours. CBG:  Recent Labs Lab 04/07/17 1147 04/07/17 1617 04/07/17 2059 04/08/17 0837 04/08/17 1153  GLUCAP 90 151* 191* 123* 137*   Lipid Profile: No results for input(s): CHOL, HDL, LDLCALC, TRIG, CHOLHDL, LDLDIRECT in the last 72 hours. Thyroid Function Tests:  Recent Labs  04/06/17 0101  TSH 2.115   Anemia Panel: No results for input(s): VITAMINB12, FOLATE, FERRITIN, TIBC, IRON, RETICCTPCT in the last 72 hours. Sepsis Labs:  Recent Labs Lab 04/04/17 1251 04/04/17 1556 04/05/17 0719 04/07/17 0310  PROCALCITON  --   --  0.61 1.55  LATICACIDVEN 1.89 0.73 0.7  --     Recent Results (from the past 240 hour(s))  Culture, blood (Routine x 2)     Status: Abnormal (Preliminary result)   Collection Time: 04/04/17 12:42 PM  Result Value Ref Range Status   Specimen Description BLOOD BLOOD RIGHT ARM   Final   Special Requests   Final    BOTTLES DRAWN AEROBIC AND ANAEROBIC Blood Culture adequate volume   Culture  Setup Time   Final    GRAM POSITIVE COCCI IN PAIRS IN CHAINS GRAM NEGATIVE RODS AEROBIC BOTTLE ONLY CRITICAL RESULT CALLED TO, READ BACK BY AND VERIFIED WITH: A PHAM,PHARMD AT 1437 04/05/17 BY L BENFIELD    Culture (A)  Final    PROTEUS MIRABILIS GROUP B STREP(S.AGALACTIAE)ISOLATED SUSCEPTIBILITIES TO FOLLOW Performed at [facility], [place] [address], [place], [place] [address]    Report Status PENDING  Incomplete   Organism ID, Bacteria PROTEUS MIRABILIS  Final      Susceptibility   Proteus mirabilis - MIC*    AMPICILLIN <=2 SENSITIVE Sensitive     CEFAZOLIN 8 SENSITIVE Sensitive     CEFEPIME <=1 SENSITIVE Sensitive     CEFTAZIDIME <=1 SENSITIVE Sensitive     CEFTRIAXONE <=1 SENSITIVE Sensitive     CIPROFLOXACIN 0.5 SENSITIVE Sensitive     GENTAMICIN <=1 SENSITIVE Sensitive     IMIPENEM 4 SENSITIVE Sensitive     TRIMETH/SULFA <=20 SENSITIVE Sensitive     AMPICILLIN/SULBACTAM <=2 SENSITIVE Sensitive     PIP/TAZO <=4 SENSITIVE Sensitive     * PROTEUS MIRABILIS  Blood Culture ID Panel (Reflexed)     Status: Abnormal   Collection Time: 04/04/17 12:42 PM  Result Value Ref Range Status   Enterococcus species NOT DETECTED NOT DETECTED Final   Listeria monocytogenes NOT DETECTED NOT DETECTED Final   Staphylococcus species NOT DETECTED NOT DETECTED Final   Staphylococcus aureus NOT DETECTED NOT DETECTED Final   Streptococcus species DETECTED (A) NOT DETECTED Final    Comment: CRITICAL RESULT CALLED TO, READ BACK BY AND VERIFIED WITH: A PHAM,PHARMD AT 1437 04/05/17 BY L BENFIELD    Streptococcus agalactiae DETECTED (A) NOT DETECTED Final    Comment: CRITICAL RESULT CALLED TO, READ BACK BY AND VERIFIED WITH: A PHAM,PHARMD AT 1437 04/05/17 BY L BENFIELD    Streptococcus pneumoniae NOT DETECTED NOT DETECTED Final   Streptococcus pyogenes NOT DETECTED NOT DETECTED Final    Acinetobacter baumannii NOT DETECTED NOT DETECTED Final   Enterobacteriaceae species DETECTED (A) NOT DETECTED Final  Comment: Enterobacteriaceae represent a large family of gram-negative bacteria, not a single organism. CRITICAL RESULT CALLED TO, READ BACK BY AND VERIFIED WITH: A PHAM,PHARMD AT 1437 04/05/17 BY L BENFIELD    Enterobacter cloacae complex NOT DETECTED NOT DETECTED Final   Escherichia coli NOT DETECTED NOT DETECTED Final   Klebsiella oxytoca NOT DETECTED NOT DETECTED Final   Klebsiella pneumoniae NOT DETECTED NOT DETECTED Final   Proteus species DETECTED (A) NOT DETECTED Final    Comment: CRITICAL RESULT CALLED TO, READ BACK BY AND VERIFIED WITH: A PHAM,PHARMD AT 1437 04/05/17 BY L BENFIELD    Serratia marcescens NOT DETECTED NOT DETECTED Final   Carbapenem resistance NOT DETECTED NOT DETECTED Final   Haemophilus influenzae NOT DETECTED NOT DETECTED Final   Neisseria meningitidis NOT DETECTED NOT DETECTED Final   Pseudomonas aeruginosa NOT DETECTED NOT DETECTED Final   Candida albicans NOT DETECTED NOT DETECTED Final   Candida glabrata NOT DETECTED NOT DETECTED Final   Candida krusei NOT DETECTED NOT DETECTED Final   Candida parapsilosis NOT DETECTED NOT DETECTED Final   Candida tropicalis NOT DETECTED NOT DETECTED Final    Comment: Performed at [facility], [place] [address] [place], [place] [address]  Urine culture     Status: Abnormal   Collection Time: 04/04/17  3:43 PM  Result Value Ref Range Status   Specimen Description URINE, CATHETERIZED  Final   Special Requests NONE  Final   Culture MULTIPLE ORGANISMS PRESENT, NONE PREDOMINANT (A)  Final   Report Status 04/05/2017 FINAL  Final  Culture, blood (Routine x 2)     Status: None (Preliminary result)   Collection Time: 04/04/17  6:54 PM  Result Value Ref Range Status   Specimen Description BLOOD LEFT ANTECUBITAL  Final   Special Requests IN PEDIATRIC BOTTLE Blood Culture adequate volume  Final   Culture    Final    NO GROWTH 4 DAYS Performed at [facility], [place] [address], [place], [place] [address]    Report Status PENDING  Incomplete  MRSA PCR Screening     Status: None   Collection Time: 04/04/17  6:58 PM  Result Value Ref Range Status   MRSA by PCR NEGATIVE NEGATIVE Final    Comment:        The GeneXpert MRSA Assay (FDA approved for NASAL specimens only), is one component of a comprehensive MRSA colonization surveillance program. It is not intended to diagnose MRSA infection nor to guide or monitor treatment for MRSA infections.      Radiology Studies: Dg Chest 1 View  Result Date: 04/07/2017 CLINICAL DATA:  Shortness of breath EXAM: CHEST 1 VIEW COMPARISON:  04/04/2017 FINDINGS: Cardiomegaly with central vascular congestion and perihilar interstitial opacities suspicious for mild edema. No significant effusion. Mild atelectasis at the right base. No pneumothorax. IMPRESSION: Mild cardiomegaly with central vascular congestion and mild interstitial pulmonary edema. Atelectasis at the right lung base. Electronically Signed   By: Donavan Foil M.D.   On: 04/07/2017 00:32   Dg Chest Port 1 View  Result Date: 04/07/2017 CLINICAL DATA:  SOB. Hx of HTN and DM. EXAM: PORTABLE CHEST 1 VIEW COMPARISON:  04/07/2017 FINDINGS: Heart is enlarged. There are perihilar infiltrates. More focal opacity is identified at the lung bases, partially obscuring the hemidiaphragms. Findings are consistent with pulmonary edema, similar in appearance to exam earlier today. IMPRESSION: Congestive heart failure. Electronically Signed   By: Nolon Nations M.D.   On: 04/07/2017 08:22    Scheduled Meds: .  acidophilus  2 capsule Oral TID  . aspirin EC  81 mg Oral Daily  . enoxaparin (LOVENOX) injection  40 mg Subcutaneous Q24H  . feeding supplement (ENSURE ENLIVE)  237 mL Oral BID BM  . fluticasone  2 spray Each Nare Daily  . insulin aspart  0-15 Units Subcutaneous TID WC  . insulin aspart  0-5 Units  Subcutaneous QHS  . mouth rinse  15 mL Mouth Rinse BID  . multivitamin with minerals  1 tablet Oral Daily  . sodium chloride flush  3 mL Intravenous Q12H   Continuous Infusions: . sodium chloride 10 mL/hr at 04/08/17 0600  . cefTRIAXone (ROCEPHIN)  IV Stopped (04/07/17 1341)     LOS: 4 days   CHIU, Orpah Melter, MD [facility] Pager [phone]  If 7PM-7AM, please contact night-coverage [email or web address] Password [email or web address] 04/08/2017, 1:40 PM

## 2017-04-08 NOTE — Progress Notes (Signed)
Around 10 am, pt HR dropped from 90s to 40s while receiving bath. HR immediately returned to 90s, BP stable, alert throughout.  At 12:00 while swallowing pills pt's HR dropped to asystole - 14 second pause. Pt not responsive during pause. HR returned to 90s and patient alert and oriented. BP stable. MD aware of events.

## 2017-04-09 ENCOUNTER — Inpatient Hospital Stay (HOSPITAL_COMMUNITY): Payer: BLUE CROSS/BLUE SHIELD

## 2017-04-09 DIAGNOSIS — I361 Nonrheumatic tricuspid (valve) insufficiency: Secondary | ICD-10-CM

## 2017-04-09 DIAGNOSIS — I495 Sick sinus syndrome: Secondary | ICD-10-CM

## 2017-04-09 LAB — BASIC METABOLIC PANEL
ANION GAP: 9 (ref 5–15)
BUN: 22 mg/dL — ABNORMAL HIGH (ref 6–20)
CALCIUM: 10 mg/dL (ref 8.9–10.3)
CO2: 25 mmol/L (ref 22–32)
Chloride: 105 mmol/L (ref 101–111)
Creatinine, Ser: 0.58 mg/dL (ref 0.44–1.00)
Glucose, Bld: 126 mg/dL — ABNORMAL HIGH (ref 65–99)
POTASSIUM: 3.5 mmol/L (ref 3.5–5.1)
SODIUM: 139 mmol/L (ref 135–145)

## 2017-04-09 LAB — RAPID STREP SCREEN (MED CTR MEBANE ONLY): STREPTOCOCCUS, GROUP A SCREEN (DIRECT): NEGATIVE

## 2017-04-09 LAB — CULTURE, BLOOD (ROUTINE X 2)
CULTURE: NO GROWTH
SPECIAL REQUESTS: ADEQUATE
Special Requests: ADEQUATE

## 2017-04-09 LAB — CBC
HEMATOCRIT: 31.5 % — AB (ref 36.0–46.0)
HEMOGLOBIN: 9.8 g/dL — AB (ref 12.0–15.0)
MCH: 24.1 pg — ABNORMAL LOW (ref 26.0–34.0)
MCHC: 31.1 g/dL (ref 30.0–36.0)
MCV: 77.6 fL — ABNORMAL LOW (ref 78.0–100.0)
Platelets: 311 10*3/uL (ref 150–400)
RBC: 4.06 MIL/uL (ref 3.87–5.11)
RDW: 16.9 % — ABNORMAL HIGH (ref 11.5–15.5)
WBC: 9.5 10*3/uL (ref 4.0–10.5)

## 2017-04-09 LAB — GLUCOSE, CAPILLARY
GLUCOSE-CAPILLARY: 116 mg/dL — AB (ref 65–99)
GLUCOSE-CAPILLARY: 117 mg/dL — AB (ref 65–99)
GLUCOSE-CAPILLARY: 164 mg/dL — AB (ref 65–99)
Glucose-Capillary: 118 mg/dL — ABNORMAL HIGH (ref 65–99)

## 2017-04-09 LAB — PROCALCITONIN: PROCALCITONIN: 1.36 ng/mL

## 2017-04-09 LAB — ECHOCARDIOGRAM COMPLETE
HEIGHTINCHES: 61 in
WEIGHTICAEL: 2730.18 [oz_av]

## 2017-04-09 MED ORDER — CARVEDILOL 6.25 MG PO TABS
6.2500 mg | ORAL_TABLET | Freq: Two times a day (BID) | ORAL | Status: DC
  Administered 2017-04-09 – 2017-04-10 (×2): 6.25 mg via ORAL
  Filled 2017-04-09 (×2): qty 1

## 2017-04-09 MED ORDER — LOSARTAN POTASSIUM 25 MG PO TABS
25.0000 mg | ORAL_TABLET | Freq: Every day | ORAL | Status: DC
  Administered 2017-04-09 – 2017-04-10 (×2): 25 mg via ORAL
  Filled 2017-04-09 (×3): qty 1

## 2017-04-09 MED ORDER — POTASSIUM CHLORIDE CRYS ER 20 MEQ PO TBCR
40.0000 meq | EXTENDED_RELEASE_TABLET | Freq: Once | ORAL | Status: AC
  Administered 2017-04-09: 40 meq via ORAL
  Filled 2017-04-09: qty 2

## 2017-04-09 MED ORDER — FUROSEMIDE 10 MG/ML IJ SOLN
40.0000 mg | Freq: Two times a day (BID) | INTRAMUSCULAR | Status: DC
  Administered 2017-04-09 – 2017-04-10 (×3): 40 mg via INTRAVENOUS
  Filled 2017-04-09 (×3): qty 4

## 2017-04-09 NOTE — Progress Notes (Signed)
Pt. has not been using CPAP, currently sleeping in no distress, RT to monitor.

## 2017-04-09 NOTE — Progress Notes (Signed)
PROGRESS NOTE    Sharon Warner  QQV:[id number] DOB: [birth year] DOA: 04/04/2017 PCP: [name], MD    Brief Narrative:  66 y.o. female with Past medical history of paraplegia, neurogenic bladder, wheelchair-bound, chronic indwelling Foley catheter, sacral decubitus ulcer, morbid obesity, HTN, type II DM, sacral osteomyelitis, chronic diastolic CHF. Patient presented with complains of fever. In her usual health yesterday. This morning when she woke up and the home health nurse came to see her she had a temp of 101.4 and she was asked to go to ER. Denies having any complaints of chills, nausea, vomiting, abdominal pain, chest pain, headache, dizziness. No cough. No recent change in medications. Her Foley catheter is past due for a change for 2 days. She has a colostomy bag which was last emptied on Wednesday. Denies any recent change in medication other than decreasing Lopressor which was done almost a month ago.  ED Course: Presented with fever, was febrile with tachycardia, UA was showing pyuria and patient was started on antibiotics and was referred for admission  Assessment & Plan:   Principal Problem:   Sepsis ([place]) Active Problems:   Morbid obesity ([place])   HTN (hypertension)   Paraplegia (H[facility])   Neurogenic bladder   Obstructive sleep apnea   Protein-calorie malnutrition ([facility])   Hypercalcemia   Chronic diastolic heart failure ([facility])   Sacral decubitus ulcer, stage IV ([place])  1. UTI with sepsis present on admission [facility]) likely associated with foley cath, bacteremia -Pt presents with fever, leukocytosis, sinus tachycardia and evidence of pyuria the patient is meeting sepsis criteria. -Last year in June 2017 patient had polymicrobial bacteremia as well as sacral osteomyelitis and was treated with ceftriaxone. -foley cath changed at time of admission -cultures thus far pos for proteus and strep species. Proteus is pan-sensitive -Patient is continued on rocephin -Lactic acid  within normal limits -Sepsis physiology resolving -BP improved and pt with signs of volume overload. Holding further IVF and diuresing as tolerated  2. Sinus tachycardia/bradycardia. -Pt presented with heart rate persistently in the range of 130s and 140s, sinus on telemetry. -Likely sequela of presenting UTI with sepsis versus rebound tachycardia after holding beta blocker secondary to hypertension -Tachycardia resolved with resumption of beta blocker, however on 7/10, patient noted to be transiently profoundly bradycardic -keep mag>2, k >4, tsh 2.1, get echocardiogram, cardiology consulted  3.  acute on Chronic diastolic heart failure. -Suspecting that the patient has chronic diastolic heart failure, no echocardiogram available in our system. -Patient had been on torsemide prior to hospital admission. -Given presenting sepsis, diuretics were initially held and patient was continued on aggressive IV fluid hydration. Patient is clinically volume overloaded. Chest x-ray reviewed, with findings of developing pulmonary edema -Per above, IVF now on hold and diuresing cautiously as tolerated  4. Hypercalcemia. -Etiology remains unclear. -Home vitamin D supplementation on hold -Calcium levels have normalized  5. Type 2 diabetes mellitus. -On sensitive sliding scale. -Not on diabetic medications prior to admission -Labs reviewed. Currently stable  6. OSA. -Continue CPAP Daily at bedtime. -Currently stable  7. Hoarse voice -Unclear etiology -Pt denies coughing, sore throat -Question laryngitis  -Continue to monitor -soft tissue neck CT no acute findings, rapid strep test negative  DVT prophylaxis: Lovenox subcutaneous Code Status: Full code Family Communication: Patient in room, family not at bedside Disposition Plan: remain in stepdown due to tachy/brady  Consultants:   cardiology  Procedures:   none  Antimicrobials: Anti-infectives    Start     Dose/Rate Route  Frequency Ordered Stop   04/06/17 1400  cefTRIAXone (ROCEPHIN) 2 g in dextrose 5 % 50 mL IVPB     2 g 100 mL/hr over 30 Minutes Intravenous Every 24 hours 04/05/17 1417     04/05/17 1400  cefTRIAXone (ROCEPHIN) 1 g in dextrose 5 % 50 mL IVPB  Status:  Discontinued     1 g 100 mL/hr over 30 Minutes Intravenous Every 24 hours 04/04/17 1235 04/05/17 1417   04/04/17 1230  cefTRIAXone (ROCEPHIN) 2 g in dextrose 5 % 50 mL IVPB     2 g 100 mL/hr over 30 Minutes Intravenous  Once 04/04/17 1220 04/04/17 2213      Subjective: Complains of hoarse voice Bradycardia,  Objective: Vitals:   04/09/17 0300 04/09/17 0333 04/09/17 0400 04/09/17 0500  BP: 115/68  119/63 115/64  Pulse: 77  88 95  Resp: (!) 23  19 (!) 25  Temp:  (!) 97.5 F (36.4 C)    TempSrc:  Oral    SpO2: 99%  100% 100%  Weight:  77.4 kg (170 lb 10.2 oz)    Height:        Intake/Output Summary (Last 24 hours) at 04/09/17 0733 Last data filed at 04/09/17 0500  Gross per 24 hour  Intake              685 ml  Output             4530 ml  Net            -3845 ml   Filed Weights   04/07/17 0500 04/08/17 0500 04/09/17 0333  Weight: 84.6 kg (186 lb 8.2 oz) 81.4 kg (179 lb 7.3 oz) 77.4 kg (170 lb 10.2 oz)    Examination: General exam: Awake, laying in bed, in nad, hoarse voice Respiratory system: Normal respiratory effort, no wheezing Cardiovascular system: regular rate, s1, s2 Gastrointestinal system: Soft, nondistended, positive BS Central nervous system: CN2-12 grossly intact, strength intact Extremities: Perfused, no clubbing Skin: Normal skin turgor, no notable skin lesions seen Psychiatry: Mood normal // no visual hallucinations    Data Reviewed: I have personally reviewed following labs and imaging studies  CBC:  Recent Labs Lab 04/04/17 1242 04/05/17 0348 04/06/17 0101 04/07/17 0310 04/08/17 0318 04/09/17 0301  WBC 10.6* 11.5* 9.5 8.0 10.4 9.5  NEUTROABS 7.4 9.0*  --   --   --   --   HGB 11.8* 9.2*  9.5* 9.1* 9.7* 9.8*  HCT 37.4 29.3* 30.3* 29.3* 31.5* 31.5*  MCV 77.3* 76.9* 76.7* 75.3* 77.0* 77.6*  PLT 373 281 258 254 285 939   Basic Metabolic Panel:  Recent Labs Lab 04/05/17 0348 04/06/17 0101 04/07/17 0310 04/08/17 0318 04/09/17 0301  NA 134* 136 135 137 139  K 3.3* 4.1 3.7 3.6 3.5  CL 105 110 114* 110 105  CO2 20* 20* 16* 20* 25  GLUCOSE 141* 149* 139* 151* 126*  BUN 19 20 19 20  22*  CREATININE 0.60 0.61 0.54 0.52 0.58  CALCIUM 9.5 9.7 9.5 9.9 10.0  MG 1.4*  --   --  1.8  --    GFR: Estimated Creatinine Clearance: 66.8 mL/min (by C-G formula based on SCr of 0.58 mg/dL). Liver Function Tests:  Recent Labs Lab 04/04/17 1242 04/05/17 0348  AST 34 22  ALT 12* 14  ALKPHOS 68 56  BILITOT 1.4* 0.6  PROT 7.2 5.7*  ALBUMIN 3.4* 2.7*   No results for input(s): LIPASE, AMYLASE in the last 168 hours. No  results for input(s): AMMONIA in the last 168 hours. Coagulation Profile:  Recent Labs Lab 04/04/17 1242  INR 1.24   Cardiac Enzymes: No results for input(s): CKTOTAL, CKMB, CKMBINDEX, TROPONINI in the last 168 hours. BNP (last 3 results) No results for input(s): PROBNP in the last 8760 hours. HbA1C: No results for input(s): HGBA1C in the last 72 hours. CBG:  Recent Labs Lab 04/07/17 2059 04/08/17 0837 04/08/17 1153 04/08/17 1640 04/08/17 2155  GLUCAP 191* 123* 137* 99 127*   Lipid Profile: No results for input(s): CHOL, HDL, LDLCALC, TRIG, CHOLHDL, LDLDIRECT in the last 72 hours. Thyroid Function Tests: No results for input(s): TSH, T4TOTAL, FREET4, T3FREE, THYROIDAB in the last 72 hours. Anemia Panel: No results for input(s): VITAMINB12, FOLATE, FERRITIN, TIBC, IRON, RETICCTPCT in the last 72 hours. Sepsis Labs:  Recent Labs Lab 04/04/17 1251 04/04/17 1556 04/05/17 0719 04/07/17 0310 04/09/17 0301  PROCALCITON  --   --  0.61 1.55 1.36  LATICACIDVEN 1.89 0.73 0.7  --   --     Recent Results (from the past 240 hour(s))  Culture, blood  (Routine x 2)     Status: Abnormal (Preliminary result)   Collection Time: 04/04/17 12:42 PM  Result Value Ref Range Status   Specimen Description BLOOD BLOOD RIGHT ARM  Final   Special Requests   Final    BOTTLES DRAWN AEROBIC AND ANAEROBIC Blood Culture adequate volume   Culture  Setup Time   Final    GRAM POSITIVE COCCI IN PAIRS IN CHAINS GRAM NEGATIVE RODS AEROBIC BOTTLE ONLY CRITICAL RESULT CALLED TO, READ BACK BY AND VERIFIED WITH: A PHAM,PHARMD AT 1437 04/05/17 BY L BENFIELD    Culture (A)  Final    PROTEUS MIRABILIS GROUP B STREP(S.AGALACTIAE)ISOLATED SUSCEPTIBILITIES TO FOLLOW Performed at [facility], [address], [place], [place] [address]    Report Status PENDING  Incomplete   Organism ID, Bacteria PROTEUS MIRABILIS  Final      Susceptibility   Proteus mirabilis - MIC*    AMPICILLIN <=2 SENSITIVE Sensitive     CEFAZOLIN 8 SENSITIVE Sensitive     CEFEPIME <=1 SENSITIVE Sensitive     CEFTAZIDIME <=1 SENSITIVE Sensitive     CEFTRIAXONE <=1 SENSITIVE Sensitive     CIPROFLOXACIN 0.5 SENSITIVE Sensitive     GENTAMICIN <=1 SENSITIVE Sensitive     IMIPENEM 4 SENSITIVE Sensitive     TRIMETH/SULFA <=20 SENSITIVE Sensitive     AMPICILLIN/SULBACTAM <=2 SENSITIVE Sensitive     PIP/TAZO <=4 SENSITIVE Sensitive     * PROTEUS MIRABILIS  Blood Culture ID Panel (Reflexed)     Status: Abnormal   Collection Time: 04/04/17 12:42 PM  Result Value Ref Range Status   Enterococcus species NOT DETECTED NOT DETECTED Final   Listeria monocytogenes NOT DETECTED NOT DETECTED Final   Staphylococcus species NOT DETECTED NOT DETECTED Final   Staphylococcus aureus NOT DETECTED NOT DETECTED Final   Streptococcus species DETECTED (A) NOT DETECTED Final    Comment: CRITICAL RESULT CALLED TO, READ BACK BY AND VERIFIED WITH: A PHAM,PHARMD AT 1437 04/05/17 BY L BENFIELD    Streptococcus agalactiae DETECTED (A) NOT DETECTED Final    Comment: CRITICAL RESULT CALLED TO, READ BACK BY AND VERIFIED  WITH: A PHAM,PHARMD AT 1437 04/05/17 BY L BENFIELD    Streptococcus pneumoniae NOT DETECTED NOT DETECTED Final   Streptococcus pyogenes NOT DETECTED NOT DETECTED Final   Acinetobacter baumannii NOT DETECTED NOT DETECTED Final   Enterobacteriaceae species DETECTED (A) NOT DETECTED  Final    Comment: Enterobacteriaceae represent a large family of gram-negative bacteria, not a single organism. CRITICAL RESULT CALLED TO, READ BACK BY AND VERIFIED WITH: A PHAM,PHARMD AT 1437 04/05/17 BY L BENFIELD    Enterobacter cloacae complex NOT DETECTED NOT DETECTED Final   Escherichia coli NOT DETECTED NOT DETECTED Final   Klebsiella oxytoca NOT DETECTED NOT DETECTED Final   Klebsiella pneumoniae NOT DETECTED NOT DETECTED Final   Proteus species DETECTED (A) NOT DETECTED Final    Comment: CRITICAL RESULT CALLED TO, READ BACK BY AND VERIFIED WITH: A PHAM,PHARMD AT 1437 04/05/17 BY L BENFIELD    Serratia marcescens NOT DETECTED NOT DETECTED Final   Carbapenem resistance NOT DETECTED NOT DETECTED Final   Haemophilus influenzae NOT DETECTED NOT DETECTED Final   Neisseria meningitidis NOT DETECTED NOT DETECTED Final   Pseudomonas aeruginosa NOT DETECTED NOT DETECTED Final   Candida albicans NOT DETECTED NOT DETECTED Final   Candida glabrata NOT DETECTED NOT DETECTED Final   Candida krusei NOT DETECTED NOT DETECTED Final   Candida parapsilosis NOT DETECTED NOT DETECTED Final   Candida tropicalis NOT DETECTED NOT DETECTED Final    Comment: Performed at [facility], [place] [address], [place], [place] [address]  Urine culture     Status: Abnormal   Collection Time: 04/04/17  3:43 PM  Result Value Ref Range Status   Specimen Description URINE, CATHETERIZED  Final   Special Requests NONE  Final   Culture MULTIPLE ORGANISMS PRESENT, NONE PREDOMINANT (A)  Final   Report Status 04/05/2017 FINAL  Final  Culture, blood (Routine x 2)     Status: None (Preliminary result)   Collection Time: 04/04/17  6:54 PM    Result Value Ref Range Status   Specimen Description BLOOD LEFT ANTECUBITAL  Final   Special Requests IN PEDIATRIC BOTTLE Blood Culture adequate volume  Final   Culture   Final    NO GROWTH 4 DAYS Performed at [facility], [place] [address] [place], [place] [address]    Report Status PENDING  Incomplete  MRSA PCR Screening     Status: None   Collection Time: 04/04/17  6:58 PM  Result Value Ref Range Status   MRSA by PCR NEGATIVE NEGATIVE Final    Comment:        The GeneXpert MRSA Assay (FDA approved for NASAL specimens only), is one component of a comprehensive MRSA colonization surveillance program. It is not intended to diagnose MRSA infection nor to guide or monitor treatment for MRSA infections.      Radiology Studies: Ct Soft Tissue Neck W Contrast  Result Date: 04/08/2017 CLINICAL DATA:  Dysphagia. History of paraplegia, neurogenic bladder with indwelling catheter, colostomy bag, sacral osteomyelitis, diabetes. EXAM: CT NECK WITH CONTRAST TECHNIQUE: Multidetector CT imaging of the neck was performed using the standard protocol following the bolus administration of intravenous contrast. CONTRAST:  43mL ISOVUE-300 IOPAMIDOL (ISOVUE-300) INJECTION 61% COMPARISON:  Chest radiograph April 07, 2017 and CT chest April 05, 2017 FINDINGS: Moderately motion degraded examination. Streak artifact from multiple overlying lines. PHARYNX AND LARYNX: Normal.  Widely patent airway. SALIVARY GLANDS: Normal. THYROID: 2 x 2 cm complex LEFT thyroid nodule with coarse calcifications. Smaller RIGHT thyroid nodule present. Heterogeneous thyroid wound. LYMPH NODES: No lymphadenopathy by CT size criteria. VASCULAR: Normal. LIMITED INTRACRANIAL: Normal. VISUALIZED ORBITS: Normal. MASTOIDS AND VISUALIZED PARANASAL SINUSES: Well-aerated. SKELETON: Nonacute. Severe C5-6 disc height loss and endplate spurring compatible with degenerative discs. UPPER CHEST: Large RIGHT and small LEFT  pleural effusions with  venous congestion incompletely assessed. OTHER: None. IMPRESSION: 1. No acute process in the neck on this moderately motion degraded examination. Patent airway. 2. Increasing large RIGHT and small LEFT pleural effusions, and congestion consistent with pulmonary edema. 3. **An incidental finding of potential clinical significance has been found. 2.1 cm LEFT thyroid nodule. Recommend thyroid ultrasound on nonemergent basis. This follows ACR consensus guidelines: Managing Incidental Thyroid Nodules Detected on Imaging: White Paper of the ACR Incidental Thyroid Findings Committee. J Am Coll Radiol 2015; 12:143-150. ** Electronically Signed   By: Elon Alas M.D.   On: 04/08/2017 18:53   Dg Chest Port 1 View  Result Date: 04/07/2017 CLINICAL DATA:  SOB. Hx of HTN and DM. EXAM: PORTABLE CHEST 1 VIEW COMPARISON:  04/07/2017 FINDINGS: Heart is enlarged. There are perihilar infiltrates. More focal opacity is identified at the lung bases, partially obscuring the hemidiaphragms. Findings are consistent with pulmonary edema, similar in appearance to exam earlier today. IMPRESSION: Congestive heart failure. Electronically Signed   By: Nolon Nations M.D.   On: 04/07/2017 08:22    Scheduled Meds: . acidophilus  2 capsule Oral TID  . aspirin EC  81 mg Oral Daily  . enoxaparin (LOVENOX) injection  40 mg Subcutaneous Q24H  . feeding supplement (ENSURE ENLIVE)  237 mL Oral BID BM  . fluticasone  2 spray Each Nare Daily  . furosemide  40 mg Intravenous BID  . insulin aspart  0-15 Units Subcutaneous TID WC  . insulin aspart  0-5 Units Subcutaneous QHS  . mouth rinse  15 mL Mouth Rinse BID  . multivitamin with minerals  1 tablet Oral Daily  . sodium chloride flush  3 mL Intravenous Q12H   Continuous Infusions: . cefTRIAXone (ROCEPHIN)  IV Stopped (04/08/17 1432)     LOS: 5 days    Time spent> 56mins  Rilee Wendling, MD PhD [facility] Pager [phone]  If 7PM-7AM, please contact  night-coverage [email or web address] Password [facility] 04/09/2017, 7:33 AM

## 2017-04-09 NOTE — Consult Note (Signed)
Cardiology Consultation:   Patient ID: Sharon Warner; [id number]; 01-24-1951   Admit date: 04/04/2017 Date of Consult: 04/09/2017  Primary Care Provider: [name], MD Primary Cardiologist: New Primary Electrophysiologist:  None   Patient Profile:   Sharon Warner is a 66 y.o. female with a hx of paraplegia, neurogenic bladder, wheelchair bound, chronic indwelling foley catheter, sacral decubitus ulcer, morbid obesity, HTN. Type II diabetes, sacral osteomyelitis, colostomy bag, chronic diastolic CHF.  The patient is being seen today for the evaluation of CHF and bradycardia at the request of Dr. Erlinda Hong.  History of Present Illness:   Sharon Warner came to the Emergency Department on (7/6) for complaints of fever. She was been admitted for UTI with sepsis and questionable heart failure. [name] has a history of being on Torsemide prior to hospitalization, this was held initially while she was aggressively hydrated for her sepsis. She developed pulmonary edema (7/9),  IVF now on hold and she is being diuresed. Echocardiogram for this admission is pending. Today creatinine 0.58, potassium 3.5, admission net gain 4 liters. She is down ~ 1 liter diuresis in last 24 hours since starting diuretic  40 mg lasix IV BID. Her weight has increased to 170, was 158 at admission.  In the ER she was tachycardia, HR 130-140s in sinus rhythm on tele. Felt to be related to her sepsis. She did have her Lopressor dose decreased 1 month ago per chart review. The hospitalist restarted her beta blocker and her tachycardia improved. Unfortunately, yesterday morning (7/10) she had a few episodes of significant bradycardia rates in the 30-40s. One when patient was transferred to CT table, stayed in 50s for ~ 2 minutes then returned to 100-110. And then another episode while swallowing pills and then a 3rd while laying flat to receive her bath.   The patient had a 14 second pause yesterday while swallowing her pills,  per the RN she was in the room and the patient appeared to be coming in and out of consciousness. No EKG captured but pause noted on telemetry. Per RN, no episodes of bradycardia today.  Per RN/Chart review:  She is paraplegic since age of 61s. 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   Per Patient:  She has chronic hoarseness (which waxes and wanes) and can only whisper this appears to be chronic. Denies ever being told she had slow heart rate. Does not see a cardiologist. Does not get near syncopal or CP. She does seem to not be sure about her overall  PMH, which is complex.    Past Medical History:  Diagnosis Date  . Chronic heart failure ([place])   . Chronic indwelling Foley catheter    last changed few days ago  . Diabetes mellitus without complication ([place])   . Enlarged thoracic aorta ([place]) 03/14/2016  . H/O paraplegia    ascending paralysis unclear etiology  . Hypertension   . Lumbar spondylosis   . Morbid obesity ([place])   . OSA on CPAP   . Sacral decubitus ulcer, stage IV [facility])     Past Surgical History:  Procedure Laterality Date  . JOINT REPLACEMENT Right    Hip  . TONSILLECTOMY    . VAGINAL HYSTERECTOMY       Inpatient Medications: Scheduled Meds: . acidophilus  2 capsule Oral TID  . aspirin EC  81 mg Oral Daily  . enoxaparin (LOVENOX) injection  40 mg Subcutaneous Q24H  . feeding supplement (ENSURE ENLIVE)  237 mL Oral BID BM  .  fluticasone  2 spray Each Nare Daily  . furosemide  40 mg Intravenous BID  . insulin aspart  0-15 Units Subcutaneous TID WC  . insulin aspart  0-5 Units Subcutaneous QHS  . mouth rinse  15 mL Mouth Rinse BID  . multivitamin with minerals  1 tablet Oral Daily  . sodium chloride flush  3 mL Intravenous Q12H   Continuous Infusions: . cefTRIAXone (ROCEPHIN)  IV Stopped (04/08/17 1432)   PRN Meds: acetaminophen **OR** acetaminophen, iopamidol, labetalol, naproxen sodium, ondansetron **OR** ondansetron (ZOFRAN) IV,  traMADol-acetaminophen  Allergies:    Allergies  Allergen Reactions  . Levaquin [Levofloxacin In D5w] Nausea Only    Unknown childhood allergy  . Penicillins Other (See Comments)    Has patient had a PCN reaction causing immediate rash, facial/tongue/throat swelling, SOB or lightheadedness with hypotension: Unknown Has patient had a PCN reaction causing severe rash involving mucus membranes or skin necrosis: Unknown Has patient had a PCN reaction that required hospitalization: No Has patient had a PCN reaction occurring within the last 10 years: No If all of the above answers are "NO", then may proceed with Cephalosporin use.    Social History:   Social History   Social History  . Marital status: Married    Spouse name: Lon  . Number of children: 2  . Years of education: 16   Occupational History  . Not on file.   Social History Main Topics  . Smoking status: Never Smoker  . Smokeless tobacco: Never Used  . Alcohol use No  . Drug use: No  . Sexual activity: Not on file   Other Topics Concern  . Not on file   Social History Narrative   Lives at [facility] right now   Patient just moved to [place] from [place].    Does have a cat at home.   Brother and son live locally.   Caffeine use:  1 cup per day   Right-handed      Family History:   The patient's family history includes Diabetes in her maternal grandmother; Heart disease in her maternal grandfather; Hypothyroidism in her mother.  ROS:  Please see the history of present illness.  All other ROS reviewed and negative.     Physical Exam/Data:   Vitals:   04/09/17 0333 04/09/17 0400 04/09/17 0500 04/09/17 0800  BP:  119/63 115/64   Pulse:  88 95   Resp:  19 (!) 25   Temp: (!) 97.5 F (36.4 C)   (!) 97.5 F (36.4 C)  TempSrc: Oral   Oral  SpO2:  100% 100%   Weight: 170 lb 10.2 oz (77.4 kg)     Height:        Intake/Output Summary (Last 24 hours) at 04/09/17 0958 Last data filed  at 04/09/17 0900  Gross per 24 hour  Intake              535 ml  Output             4105 ml  Net            -3570 ml   Filed Weights   04/07/17 0500 04/08/17 0500 04/09/17 0333  Weight: 186 lb 8.2 oz (84.6 kg) 179 lb 7.3 oz (81.4 kg) 170 lb 10.2 oz (77.4 kg)   Body mass index is 32.24 kg/m.   General: Well developed, well nourished, in no acute distress. Head: Normocephalic, atraumatic, Neck: Negative for carotid bruits. JVD not elevated. Unable  to speak above a whisper Lungs: Crackles to lung basis, tachypneic Heart: + tachycardia Abdomen: Soft, non-tender, + colostomy and indwelling foley. Neuro: Alert and oriented X 3. Paraplegic to LE. + le edema.  EKG:  The EKG was personally reviewed and demonstrates sinus tachycardia, rates 130  Relevant CV Studies:  Echocardiogram pending this admission  Laboratory Data:  Chemistry Recent Labs Lab 04/07/17 0310 04/08/17 0318 04/09/17 0301  NA 135 137 139  K 3.7 3.6 3.5  CL 114* 110 105  CO2 16* 20* 25  GLUCOSE 139* 151* 126*  BUN 19 20 22*  CREATININE 0.54 0.52 0.58  CALCIUM 9.5 9.9 10.0  GFRNONAA >60 >60 >60  GFRAA >60 >60 >60  ANIONGAP 5 7 9      Recent Labs Lab 04/04/17 1242 04/05/17 0348  PROT 7.2 5.7*  ALBUMIN 3.4* 2.7*  AST 34 22  ALT 12* 14  ALKPHOS 68 56  BILITOT 1.4* 0.6   Hematology Recent Labs Lab 04/07/17 0310 04/08/17 0318 04/09/17 0301  WBC 8.0 10.4 9.5  RBC 3.89 4.09 4.06  HGB 9.1* 9.7* 9.8*  HCT 29.3* 31.5* 31.5*  MCV 75.3* 77.0* 77.6*  MCH 23.4* 23.7* 24.1*  MCHC 31.1 30.8 31.1  RDW 16.4* 16.6* 16.9*  PLT 254 285 311   Radiology/Studies:  Dg Chest 1 View  Result Date: 04/07/2017 CLINICAL DATA:  Shortness of breath EXAM: CHEST 1 VIEW COMPARISON:  04/04/2017 FINDINGS: Cardiomegaly with central vascular congestion and perihilar interstitial opacities suspicious for mild edema. No significant effusion. Mild atelectasis at the right base. No pneumothorax. IMPRESSION: Mild cardiomegaly with  central vascular congestion and mild interstitial pulmonary edema. Atelectasis at the right lung base. Electronically Signed   By: Donavan Foil M.D.   On: 04/07/2017 00:32   Ct Soft Tissue Neck W Contrast  Result Date: 04/08/2017 CLINICAL DATA:  Dysphagia. History of paraplegia, neurogenic bladder with indwelling catheter, colostomy bag, sacral osteomyelitis, diabetes. EXAM: CT NECK WITH CONTRAST TECHNIQUE: Multidetector CT imaging of the neck was performed using the standard protocol following the bolus administration of intravenous contrast. CONTRAST:  25mL ISOVUE-300 IOPAMIDOL (ISOVUE-300) INJECTION 61% COMPARISON:  Chest radiograph April 07, 2017 and CT chest April 05, 2017 FINDINGS: Moderately motion degraded examination. Streak artifact from multiple overlying lines. PHARYNX AND LARYNX: Normal.  Widely patent airway. SALIVARY GLANDS: Normal. THYROID: 2 x 2 cm complex LEFT thyroid nodule with coarse calcifications. Smaller RIGHT thyroid nodule present. Heterogeneous thyroid wound. LYMPH NODES: No lymphadenopathy by CT size criteria. VASCULAR: Normal. LIMITED INTRACRANIAL: Normal. VISUALIZED ORBITS: Normal. MASTOIDS AND VISUALIZED PARANASAL SINUSES: Well-aerated. SKELETON: Nonacute. Severe C5-6 disc height loss and endplate spurring compatible with degenerative discs. UPPER CHEST: Large RIGHT and small LEFT pleural effusions with venous congestion incompletely assessed. OTHER: None. IMPRESSION: 1. No acute process in the neck on this moderately motion degraded examination. Patent airway. 2. Increasing large RIGHT and small LEFT pleural effusions, and congestion consistent with pulmonary edema. 3. **An incidental finding of potential clinical significance has been found. 2.1 cm LEFT thyroid nodule. Recommend thyroid ultrasound on nonemergent basis. This follows ACR consensus guidelines: Managing Incidental Thyroid Nodules Detected on Imaging: White Paper of the ACR Incidental Thyroid Findings Committee. J Am  Coll Radiol 2015; 12:143-150. ** Electronically Signed   By: Elon Alas M.D.   On: 04/08/2017 18:53   Ct Angio Chest Pe W Or Wo Contrast  Result Date: 04/06/2017 CLINICAL DATA:  Tachycardia.  Sepsis, urinary tract infection. EXAM: CT ANGIOGRAPHY CHEST WITH CONTRAST TECHNIQUE: Multidetector CT imaging of  the chest was performed using the standard protocol during bolus administration of intravenous contrast. Multiplanar CT image reconstructions and MIPs were obtained to evaluate the vascular anatomy. CONTRAST:  75 cc Isovue 370 IV COMPARISON:  Radiograph yesterday CT 03/13/2016 FINDINGS: Cardiovascular: There are no filling defects within the pulmonary arteries to suggest pulmonary embolus to the proximal segmental level. Subsegmental branches cannot be assessed due to contrast bolus timing and breathing motion artifact. Again seen dilatation of main pulmonary artery measuring 4.1 cm. Unchanged aneurysmal dilatation of the ascending aorta maximal dimension 4.2 cm. No aortic dissection. Borderline mild cardiomegaly. Minimal pericardial fluid/thickening. Mediastinum/Nodes: No mediastinal hilar adenopathy. The esophagus is decompressed. There is heterogeneous low-density enlargement of the thyroid gland with calcifications on the left, unchanged in CT appearance. Lungs/Pleura: Breathing motion artifact limits detailed assessment. Elevated right hemidiaphragm with adjacent atelectasis and trace pleural effusion. Minimal left basilar atelectasis and pleural fluid/thickening. No consolidation to suggest pneumonia. No convincing pulmonary edema. No pneumothorax. Upper Abdomen: Calcified gallstones within the gallbladder. Liver appears prominent size with probable steatosis. Musculoskeletal: Minimal anterior wedging of T8 and T5 vertebral body that was not seen on prior exam. Scoliotic curvature of the thoracic spine. Review of the MIP images confirms the above findings. IMPRESSION: 1. No pulmonary embolus to the  proximal segmental level. Again seen dilatation of the main pulmonary artery suggesting pulmonary arterial hypertension. 2. Unchanged 4.2 cm aneurysmal dilatation of the ascending aorta. Recommend annual imaging followup by CTA or MRA. This recommendation follows 2010 ACCF/AHA/AATS/ACR/ASA/SCA/SCAI/SIR/STS/SVM Guidelines for the Diagnosis and Management of Patients with Thoracic Aortic Disease. Circulation. 2010; 121: B284-X324 3. Elevated right hemidiaphragm with adjacent atelectasis and small pleural effusion. Minimal left basilar atelectasis and pleural fluid. 4. Mild compression fractures of T5 and T8 vertebral bodies, age indeterminate but new from June 2017. 5. Cholelithiasis, the gallbladder is incompletely imaged. 6. Enlargement of the thyroid gland low-density, probable nodules. This is unchanged in CT appearance. Consider nonemergent ultrasound characterization. Electronically Signed   By: Jeb Levering M.D.   On: 04/06/2017 00:35   Dg Chest Port 1 View  Result Date: 04/07/2017 CLINICAL DATA:  SOB. Hx of HTN and DM. EXAM: PORTABLE CHEST 1 VIEW COMPARISON:  04/07/2017 FINDINGS: Heart is enlarged. There are perihilar infiltrates. More focal opacity is identified at the lung bases, partially obscuring the hemidiaphragms. Findings are consistent with pulmonary edema, similar in appearance to exam earlier today. IMPRESSION: Congestive heart failure. Electronically Signed   By: Nolon Nations M.D.   On: 04/07/2017 08:22    Assessment and Plan:   1. Tachybrady: Patient was tachycardic on arrival to hospital with HR 130-140s in the setting on recent discontinuation of her beta blocker and sepsis.  Her episodes of bradycardia and the 14 pause are very concerning, RN is unsure if she could feel pulses. Blood pressure rechecked right after episode and it remained normal. -- repeat  EKG ordered, sinus tachycardia.  last one 7/7 the same --  continue to monitor Telemetry closely, will discuss plan with  Dr. Acie Fredrickson if we want EP to see her.   2. Acute unspecified HF: echocardiogram pending, non available at this time.  BB restarted, as well as patient was hydrated and the tachycardia improved. She subsequently developed pulmonary edema, IVF on hold and she is being diuresed now, 40 mg IV lasix BID and has diuresed approx 1 L. Net gain is approx 4 liters. -- continue gentle IV lasix and watch creatinine and check daily weights. -- Weights are inconsistent and widely  variable but she is definitely up in weight at 170. Goal will be to get back to admission weight of 158.  3. Hypertension:  Well controlled BP  Signed, Linus Mako, PA-C  04/09/2017 9:58 AM  Attending Note:   The patient was seen and examined.  Agree with assessment and plan as noted above.  Changes made to the above note as needed.  Patient seen and independently examined with Delos Haring, PA .   We discussed all aspects of the encounter. I agree with the assessment and plan as stated above.  1. Acute on chronic combined systolic / diastolic CHF:  Pt was admitted with sepsis syndrome. Her diuretic was stopped and metoprolol XL were stopped.  Preliminary views of echo shows an EF of 30-35%.  Apical akinesis ( or severe hypokinesis)  She has not had any recent CP   I think medical therapy is our best option for now. We can consider an ischemic work up as she improves.  Will start Metoprolol 25 mg BID  Losartan 25 mg a day    2.  Bradycardia:   HR is tachycardic at present. She had a 14 sec pause that was associated with swallowing pills - I suspect this was a vagal reaction from swallowing. She is a poor candidate for pacer because of her high risk for infection ( indwelling foley, colostomy , sacral decubitus ulcer)  Will watch for now     I have spent a total of 40 minutes with patient reviewing hospital  notes , telemetry, EKGs, labs and examining patient as well as establishing an assessment and plan that  was discussed with the patient. > 50% of time was spent in direct patient care.    Thayer Headings, Brooke Bonito., MD, [facility] 04/09/2017, 12:15 PM [address],  [place] Pager [phone]

## 2017-04-09 NOTE — Consult Note (Signed)
Cardiology Consultation:   Patient ID: Karren Newland; [id number]; March 29, 1951   Admit date: 04/04/2017 Date of Consult: 04/09/2017  Primary Care Provider: [name], MD Primary Cardiologist: New Primary Electrophysiologist:  None   Patient Profile:   Jeanny Rymer is a 66 y.o. female with a hx of paraplegia, neurogenic bladder, wheelchair bound, chronic indwelling foley catheter, sacral decubitus ulcer, morbid obesity, HTN. Type II diabetes, sacral osteomyelitis, colostomy bag, chronic diastolic CHF.  The patient is being seen today for the evaluation of CHF and bradycardia at the request of Dr. Erlinda Hong.  History of Present Illness:   Merrill Doom came to the Emergency Department on (7/6) for complaints of fever. She was been admitted for UTI with sepsis and questionable heart failure. Ms Shehan has a history of being on Torsemide prior to hospitalization, this was held initially while she was aggressively hydrated for her sepsis. She developed pulmonary edema (7/9),  IVF now on hold and she is being diuresed. Echocardiogram for this admission is pending. Today creatinine 0.58, potassium 3.5, admission net gain 4 liters. She is down ~ 1 liter diuresis in last 24 hours since starting diuretic  40 mg lasix IV BID. Her weight has increased to 170, was 158 at admission.  In the ER she was tachycardia, HR 130-140s in sinus rhythm on tele. Felt to be related to her sepsis. She did have her Lopressor dose decreased 1 month ago per chart review. The hospitalist restarted her beta blocker and her tachycardia improved. Unfortunately, yesterday morning (7/10) she had a few episodes of significant bradycardia rates in the 30-40s. One when patient was transferred to CT table, stayed in 50s for ~ 2 minutes then returned to 100-110. And then another episode while swallowing pills and then a 3rd while laying flat to receive her bath.   The patient had a 14 second pause yesterday while swallowing her pills,  per the RN she was in the room and the patient appeared to be coming in and out of consciousness. No EKG captured but pause noted on telemetry. Per RN, no episodes of bradycardia today.  Per RN/Chart review:  She is paraplegic since age of 14s. 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   Per Patient:  She has chronic hoarseness (which waxes and wanes) and can only whisper this appears to be chronic. Denies ever being told she had slow heart rate. Does not see a cardiologist. Does not get near syncopal or CP. She does seem to not be sure about her overall  PMH, which is complex.    Past Medical History:  Diagnosis Date  . Chronic heart failure ([place])   . Chronic indwelling Foley catheter    last changed few days ago  . Diabetes mellitus without complication ([place])   . Enlarged thoracic aorta ([place]) 03/14/2016  . H/O paraplegia    ascending paralysis unclear etiology  . Hypertension   . Lumbar spondylosis   . Morbid obesity ([place])   . OSA on CPAP   . Sacral decubitus ulcer, stage IV [facility])     Past Surgical History:  Procedure Laterality Date  . JOINT REPLACEMENT Right    Hip  . TONSILLECTOMY    . VAGINAL HYSTERECTOMY       Inpatient Medications: Scheduled Meds: . acidophilus  2 capsule Oral TID  . aspirin EC  81 mg Oral Daily  . enoxaparin (LOVENOX) injection  40 mg Subcutaneous Q24H  . feeding supplement (ENSURE ENLIVE)  237 mL Oral BID BM  .  fluticasone  2 spray Each Nare Daily  . furosemide  40 mg Intravenous BID  . insulin aspart  0-15 Units Subcutaneous TID WC  . insulin aspart  0-5 Units Subcutaneous QHS  . mouth rinse  15 mL Mouth Rinse BID  . multivitamin with minerals  1 tablet Oral Daily  . sodium chloride flush  3 mL Intravenous Q12H   Continuous Infusions: . cefTRIAXone (ROCEPHIN)  IV Stopped (04/08/17 1432)   PRN Meds: acetaminophen **OR** acetaminophen, iopamidol, labetalol, naproxen sodium, ondansetron **OR** ondansetron (ZOFRAN) IV,  traMADol-acetaminophen  Allergies:    Allergies  Allergen Reactions  . Levaquin [Levofloxacin In D5w] Nausea Only    Unknown childhood allergy  . Penicillins Other (See Comments)    Has patient had a PCN reaction causing immediate rash, facial/tongue/throat swelling, SOB or lightheadedness with hypotension: Unknown Has patient had a PCN reaction causing severe rash involving mucus membranes or skin necrosis: Unknown Has patient had a PCN reaction that required hospitalization: No Has patient had a PCN reaction occurring within the last 10 years: No If all of the above answers are "NO", then may proceed with Cephalosporin use.    Social History:   Social History   Social History  . Marital status: Married    Spouse name: Lon  . Number of children: 2  . Years of education: 16   Occupational History  . Not on file.   Social History Main Topics  . Smoking status: Never Smoker  . Smokeless tobacco: Never Used  . Alcohol use No  . Drug use: No  . Sexual activity: Not on file   Other Topics Concern  . Not on file   Social History Narrative   Lives at [facility] right now   Patient just moved to [place] from [place].    Does have a cat at home.   Brother and son live locally.   Caffeine use:  1 cup per day   Right-handed      Family History:   The patient's family history includes Diabetes in her maternal grandmother; Heart disease in her maternal grandfather; Hypothyroidism in her mother.  ROS:  Please see the history of present illness.  All other ROS reviewed and negative.     Physical Exam/Data:   Vitals:   04/09/17 0333 04/09/17 0400 04/09/17 0500 04/09/17 0800  BP:  119/63 115/64   Pulse:  88 95   Resp:  19 (!) 25   Temp: (!) 97.5 F (36.4 C)   (!) 97.5 F (36.4 C)  TempSrc: Oral   Oral  SpO2:  100% 100%   Weight: 170 lb 10.2 oz (77.4 kg)     Height:        Intake/Output Summary (Last 24 hours) at 04/09/17 0958 Last data filed  at 04/09/17 0900  Gross per 24 hour  Intake              535 ml  Output             4105 ml  Net            -3570 ml   Filed Weights   04/07/17 0500 04/08/17 0500 04/09/17 0333  Weight: 186 lb 8.2 oz (84.6 kg) 179 lb 7.3 oz (81.4 kg) 170 lb 10.2 oz (77.4 kg)   Body mass index is 32.24 kg/m.   General: Well developed, well nourished, in no acute distress. Head: Normocephalic, atraumatic, Neck: Negative for carotid bruits. JVD not elevated. Unable  to speak above a whisper Lungs: Crackles to lung basis, tachypneic Heart: + tachycardia Abdomen: Soft, non-tender, + colostomy and indwelling foley. Neuro: Alert and oriented X 3. Paraplegic to LE. + le edema.  EKG:  The EKG was personally reviewed and demonstrates sinus tachycardia, rates 130  Relevant CV Studies:  Echocardiogram pending this admission  Laboratory Data:  Chemistry Recent Labs Lab 04/07/17 0310 04/08/17 0318 04/09/17 0301  NA 135 137 139  K 3.7 3.6 3.5  CL 114* 110 105  CO2 16* 20* 25  GLUCOSE 139* 151* 126*  BUN 19 20 22*  CREATININE 0.54 0.52 0.58  CALCIUM 9.5 9.9 10.0  GFRNONAA >60 >60 >60  GFRAA >60 >60 >60  ANIONGAP 5 7 9      Recent Labs Lab 04/04/17 1242 04/05/17 0348  PROT 7.2 5.7*  ALBUMIN 3.4* 2.7*  AST 34 22  ALT 12* 14  ALKPHOS 68 56  BILITOT 1.4* 0.6   Hematology Recent Labs Lab 04/07/17 0310 04/08/17 0318 04/09/17 0301  WBC 8.0 10.4 9.5  RBC 3.89 4.09 4.06  HGB 9.1* 9.7* 9.8*  HCT 29.3* 31.5* 31.5*  MCV 75.3* 77.0* 77.6*  MCH 23.4* 23.7* 24.1*  MCHC 31.1 30.8 31.1  RDW 16.4* 16.6* 16.9*  PLT 254 285 311   Radiology/Studies:  Dg Chest 1 View  Result Date: 04/07/2017 CLINICAL DATA:  Shortness of breath EXAM: CHEST 1 VIEW COMPARISON:  04/04/2017 FINDINGS: Cardiomegaly with central vascular congestion and perihilar interstitial opacities suspicious for mild edema. No significant effusion. Mild atelectasis at the right base. No pneumothorax. IMPRESSION: Mild cardiomegaly with  central vascular congestion and mild interstitial pulmonary edema. Atelectasis at the right lung base. Electronically Signed   By: Donavan Foil M.D.   On: 04/07/2017 00:32   Ct Soft Tissue Neck W Contrast  Result Date: 04/08/2017 CLINICAL DATA:  Dysphagia. History of paraplegia, neurogenic bladder with indwelling catheter, colostomy bag, sacral osteomyelitis, diabetes. EXAM: CT NECK WITH CONTRAST TECHNIQUE: Multidetector CT imaging of the neck was performed using the standard protocol following the bolus administration of intravenous contrast. CONTRAST:  11mL ISOVUE-300 IOPAMIDOL (ISOVUE-300) INJECTION 61% COMPARISON:  Chest radiograph April 07, 2017 and CT chest April 05, 2017 FINDINGS: Moderately motion degraded examination. Streak artifact from multiple overlying lines. PHARYNX AND LARYNX: Normal.  Widely patent airway. SALIVARY GLANDS: Normal. THYROID: 2 x 2 cm complex LEFT thyroid nodule with coarse calcifications. Smaller RIGHT thyroid nodule present. Heterogeneous thyroid wound. LYMPH NODES: No lymphadenopathy by CT size criteria. VASCULAR: Normal. LIMITED INTRACRANIAL: Normal. VISUALIZED ORBITS: Normal. MASTOIDS AND VISUALIZED PARANASAL SINUSES: Well-aerated. SKELETON: Nonacute. Severe C5-6 disc height loss and endplate spurring compatible with degenerative discs. UPPER CHEST: Large RIGHT and small LEFT pleural effusions with venous congestion incompletely assessed. OTHER: None. IMPRESSION: 1. No acute process in the neck on this moderately motion degraded examination. Patent airway. 2. Increasing large RIGHT and small LEFT pleural effusions, and congestion consistent with pulmonary edema. 3. **An incidental finding of potential clinical significance has been found. 2.1 cm LEFT thyroid nodule. Recommend thyroid ultrasound on nonemergent basis. This follows ACR consensus guidelines: Managing Incidental Thyroid Nodules Detected on Imaging: White Paper of the ACR Incidental Thyroid Findings Committee. J Am  Coll Radiol 2015; 12:143-150. ** Electronically Signed   By: Elon Alas M.D.   On: 04/08/2017 18:53   Ct Angio Chest Pe W Or Wo Contrast  Result Date: 04/06/2017 CLINICAL DATA:  Tachycardia.  Sepsis, urinary tract infection. EXAM: CT ANGIOGRAPHY CHEST WITH CONTRAST TECHNIQUE: Multidetector CT imaging of  the chest was performed using the standard protocol during bolus administration of intravenous contrast. Multiplanar CT image reconstructions and MIPs were obtained to evaluate the vascular anatomy. CONTRAST:  75 cc Isovue 370 IV COMPARISON:  Radiograph yesterday CT 03/13/2016 FINDINGS: Cardiovascular: There are no filling defects within the pulmonary arteries to suggest pulmonary embolus to the proximal segmental level. Subsegmental branches cannot be assessed due to contrast bolus timing and breathing motion artifact. Again seen dilatation of main pulmonary artery measuring 4.1 cm. Unchanged aneurysmal dilatation of the ascending aorta maximal dimension 4.2 cm. No aortic dissection. Borderline mild cardiomegaly. Minimal pericardial fluid/thickening. Mediastinum/Nodes: No mediastinal hilar adenopathy. The esophagus is decompressed. There is heterogeneous low-density enlargement of the thyroid gland with calcifications on the left, unchanged in CT appearance. Lungs/Pleura: Breathing motion artifact limits detailed assessment. Elevated right hemidiaphragm with adjacent atelectasis and trace pleural effusion. Minimal left basilar atelectasis and pleural fluid/thickening. No consolidation to suggest pneumonia. No convincing pulmonary edema. No pneumothorax. Upper Abdomen: Calcified gallstones within the gallbladder. Liver appears prominent size with probable steatosis. Musculoskeletal: Minimal anterior wedging of T8 and T5 vertebral body that was not seen on prior exam. Scoliotic curvature of the thoracic spine. Review of the MIP images confirms the above findings. IMPRESSION: 1. No pulmonary embolus to the  proximal segmental level. Again seen dilatation of the main pulmonary artery suggesting pulmonary arterial hypertension. 2. Unchanged 4.2 cm aneurysmal dilatation of the ascending aorta. Recommend annual imaging followup by CTA or MRA. This recommendation follows 2010 ACCF/AHA/AATS/ACR/ASA/SCA/SCAI/SIR/STS/SVM Guidelines for the Diagnosis and Management of Patients with Thoracic Aortic Disease. Circulation. 2010; 121: Q008-Q761 3. Elevated right hemidiaphragm with adjacent atelectasis and small pleural effusion. Minimal left basilar atelectasis and pleural fluid. 4. Mild compression fractures of T5 and T8 vertebral bodies, age indeterminate but new from June 2017. 5. Cholelithiasis, the gallbladder is incompletely imaged. 6. Enlargement of the thyroid gland low-density, probable nodules. This is unchanged in CT appearance. Consider nonemergent ultrasound characterization. Electronically Signed   By: Jeb Levering M.D.   On: 04/06/2017 00:35   Dg Chest Port 1 View  Result Date: 04/07/2017 CLINICAL DATA:  SOB. Hx of HTN and DM. EXAM: PORTABLE CHEST 1 VIEW COMPARISON:  04/07/2017 FINDINGS: Heart is enlarged. There are perihilar infiltrates. More focal opacity is identified at the lung bases, partially obscuring the hemidiaphragms. Findings are consistent with pulmonary edema, similar in appearance to exam earlier today. IMPRESSION: Congestive heart failure. Electronically Signed   By: Nolon Nations M.D.   On: 04/07/2017 08:22    Assessment and Plan:   1. Tachybrady: Patient was tachycardic on arrival to hospital with HR 130-140s in the setting on recent discontinuation of her beta blocker and sepsis.  Her episodes of bradycardia and the 14 pause are very concerning, RN is unsure if she could feel pulses. Blood pressure rechecked right after episode and it remained normal. -- repeat  EKG ordered, sinus tachycardia.  last one 7/7 the same --  continue to monitor Telemetry closely, will discuss plan with  Dr. Acie Fredrickson if we want EP to see her.   2. Acute unspecified HF: echocardiogram pending, non available at this time.  BB restarted, as well as patient was hydrated and the tachycardia improved. She subsequently developed pulmonary edema, IVF on hold and she is being diuresed now, 40 mg IV lasix BID and has diuresed approx 1 L. Net gain is approx 4 liters. -- continue gentle IV lasix and watch creatinine and check daily weights. -- Weights are inconsistent and widely  variable but she is definitely up in weight at 170. Goal will be to get back to admission weight of 158.  3. Hypertension:  Well controlled BP  Signed, Linus Mako, PA-C  04/09/2017 9:58 AM  Attending Note:   The patient was seen and examined.  Agree with assessment and plan as noted above.  Changes made to the above note as needed.  Patient seen and independently examined with Delos Haring, PA .   We discussed all aspects of the encounter. I agree with the assessment and plan as stated above.  1. Acute on chronic combined systolic / diastolic CHF:  Pt was admitted with sepsis syndrome. Her diuretic was stopped and metoprolol XL were stopped.  Preliminary views of echo shows an EF of 30-35%.  Apical akinesis ( or severe hypokinesis)  She has not had any recent CP   I think medical therapy is our best option for now. We can consider an ischemic work up as she improves.  Will start coreg 6.25 mg PO BID  Losartan 25 mg a day    2.  Bradycardia She had a pause while swallowing some pills - this was likely a vagal reaction  . She does not need a pacer and is a very poor candidate for pacer with her multiple sites for potential infection ( indwelling foley, sacral decubitus, colostomy bag.  Will watch closely as we start back beta blockers     I have spent a total of 40 minutes with patient reviewing hospital  notes , telemetry, EKGs, labs and examining patient as well as establishing an assessment and plan that was  discussed with the patient. > 50% of time was spent in direct patient care.    Thayer Headings, Brooke Bonito., MD, [facility] 04/09/2017, 12:15 PM [address]  [place] Pager [phone]

## 2017-04-09 NOTE — Progress Notes (Signed)
Nutrition Follow-up  DOCUMENTATION CODES:   Obesity unspecified  INTERVENTION:  - Continue Ensure Enlive BID. - Continue to encourage PO intakes of meals and supplements. - RD will continue to monitor for additional nutrition-related needs.   NUTRITION DIAGNOSIS:   Increased nutrient needs related to wound healing as evidenced by estimated needs. -ongoing  GOAL:   Patient will meet greater than or equal to 90% of their needs -unmet, improving.  MONITOR:   PO intake, Supplement acceptance, Labs, Weight trends, Skin, I & O's  ASSESSMENT:   66 y.o. female with Past medical history of paraplegia, neurogenic bladder, wheelchair-bound, chronic indwelling Foley catheter, sacral decubitus ulcer, morbid obesity, HTN, type II DM, sacral osteomyelitis, chronic diastolic CHF.  7/85 Per chart review, pt consumed 10% of breakfast yesterday morning. No other intakes documented since previous assessment. Pt reports that she consumed 50% of breakfast this AM which consisted of corn flakes with milk, fruit cup, and another item which RD was unable to understand. Pt continues to have hoarse voice/loss of voice and mouthed words to communicate. She states appetite and intakes have been improving over the past 24 hours and that bland taste is no longer an issue. Weight was trending up 7/6-7/9 and is now trending back down; current weight is +5.7 kg from admission weight. Pt confirms that she is drinking at least 1 bottle of Ensure/day.  Medications reviewed; 2 capsules Risaquad TID, 40 mg IV Lasix BID, sliding scale Novolog, 2 g IV Mg sulfate x1 run yesterday, daily multivitamin with minerals, 10 mEq KCl x4 runs yesterday, 40 mEq oral KCl x1 dose today.  Labs reviewed; CBG: 116 mg/dL this MA, BUN: 22 mg/dL.    7/9 - Pt with hoarse voice.  - Reports poor appetite which started not that long before she was admitted.  - Denies issues with chewing or swallowing food.  - States foods taste bland to her.   - Pt was ordered an omelet for breakfast but she didn't eat it d/t it not having any "seasoning".  - PO intakes have ranged from 40-75% since admission.  - She drinks Ensure supplements at home. RD will order supplement and MVI d/t stage IV wound. - Per chart review, pt has lost 68 lb since 06/27/16 (30% wt loss x 9 months, significant for time frame).  - Nutrition focused physical exam shows no sign of depletion of muscle mass or body fat. - Pt is wheelchair bound and paraplegic.   Diet Order:  Diet Carb Modified Fluid consistency: Thin; Room service appropriate? Yes  Skin:  Wound (see comment) (Pressure ulcer: Stg IV sacrum)  Last BM:  7/8  Height:   Ht Readings from Last 1 Encounters:  04/04/17 5\' 1"  (1.549 m)    Weight:   Wt Readings from Last 1 Encounters:  04/09/17 170 lb 10.2 oz (77.4 kg)    Ideal Body Weight:  45.3 kg (adjusted for paraplegia)  BMI:  Body mass index is 32.24 kg/m.  Estimated Nutritional Needs:   Kcal:  2000-2200  Protein:  100-110g  Fluid:  2L/day  EDUCATION NEEDS:   Education needs addressed    Jarome Matin, MS, RD, LDN, CNSC Inpatient Clinical Dietitian Pager # [phone] After hours/weekend pager # [phone]

## 2017-04-09 NOTE — Progress Notes (Signed)
  Echocardiogram 2D Echocardiogram has been performed.  Darlina Sicilian M 04/09/2017, 12:12 PM

## 2017-04-10 ENCOUNTER — Encounter (HOSPITAL_COMMUNITY): Payer: Self-pay | Admitting: Cardiovascular Disease

## 2017-04-10 DIAGNOSIS — E876 Hypokalemia: Secondary | ICD-10-CM

## 2017-04-10 DIAGNOSIS — I5023 Acute on chronic systolic (congestive) heart failure: Secondary | ICD-10-CM

## 2017-04-10 LAB — BLOOD CULTURE ID PANEL (REFLEXED)
Acinetobacter baumannii: NOT DETECTED
CANDIDA PARAPSILOSIS: NOT DETECTED
CANDIDA TROPICALIS: NOT DETECTED
Candida albicans: NOT DETECTED
Candida glabrata: NOT DETECTED
Candida krusei: NOT DETECTED
Enterobacter cloacae complex: NOT DETECTED
Enterobacteriaceae species: NOT DETECTED
Enterococcus species: NOT DETECTED
Escherichia coli: NOT DETECTED
HAEMOPHILUS INFLUENZAE: NOT DETECTED
KLEBSIELLA OXYTOCA: NOT DETECTED
KLEBSIELLA PNEUMONIAE: NOT DETECTED
Listeria monocytogenes: NOT DETECTED
METHICILLIN RESISTANCE: DETECTED — AB
NEISSERIA MENINGITIDIS: NOT DETECTED
Proteus species: NOT DETECTED
Pseudomonas aeruginosa: NOT DETECTED
SERRATIA MARCESCENS: NOT DETECTED
STAPHYLOCOCCUS AUREUS BCID: NOT DETECTED
STAPHYLOCOCCUS SPECIES: DETECTED — AB
Streptococcus agalactiae: NOT DETECTED
Streptococcus pneumoniae: NOT DETECTED
Streptococcus pyogenes: NOT DETECTED
Streptococcus species: NOT DETECTED

## 2017-04-10 LAB — GLUCOSE, CAPILLARY
GLUCOSE-CAPILLARY: 104 mg/dL — AB (ref 65–99)
GLUCOSE-CAPILLARY: 133 mg/dL — AB (ref 65–99)
GLUCOSE-CAPILLARY: 101 mg/dL — AB (ref 65–99)
Glucose-Capillary: 104 mg/dL — ABNORMAL HIGH (ref 65–99)

## 2017-04-10 LAB — BASIC METABOLIC PANEL
ANION GAP: 9 (ref 5–15)
BUN: 24 mg/dL — ABNORMAL HIGH (ref 6–20)
CALCIUM: 9.8 mg/dL (ref 8.9–10.3)
CO2: 30 mmol/L (ref 22–32)
Chloride: 103 mmol/L (ref 101–111)
Creatinine, Ser: 0.66 mg/dL (ref 0.44–1.00)
Glucose, Bld: 125 mg/dL — ABNORMAL HIGH (ref 65–99)
POTASSIUM: 3.4 mmol/L — AB (ref 3.5–5.1)
Sodium: 142 mmol/L (ref 135–145)

## 2017-04-10 LAB — MAGNESIUM: Magnesium: 2.2 mg/dL (ref 1.7–2.4)

## 2017-04-10 MED ORDER — TORSEMIDE 10 MG PO TABS
10.0000 mg | ORAL_TABLET | Freq: Every day | ORAL | Status: DC
  Administered 2017-04-10 – 2017-04-12 (×3): 10 mg via ORAL
  Filled 2017-04-10 (×4): qty 1

## 2017-04-10 MED ORDER — POTASSIUM CHLORIDE CRYS ER 10 MEQ PO TBCR
10.0000 meq | EXTENDED_RELEASE_TABLET | Freq: Every day | ORAL | Status: DC
  Administered 2017-04-12 – 2017-04-16 (×5): 10 meq via ORAL
  Filled 2017-04-10 (×6): qty 1

## 2017-04-10 MED ORDER — METOPROLOL SUCCINATE ER 25 MG PO TB24: 25.0000 mg | ORAL_TABLET | Freq: Every day | ORAL | Status: DC

## 2017-04-10 MED ORDER — CARVEDILOL 6.25 MG PO TABS: 6.2500 mg | ORAL_TABLET | Freq: Two times a day (BID) | ORAL | Status: DC

## 2017-04-10 MED ORDER — SENNOSIDES-DOCUSATE SODIUM 8.6-50 MG PO TABS
1.0000 | ORAL_TABLET | Freq: Two times a day (BID) | ORAL | Status: DC
  Administered 2017-04-10 – 2017-04-13 (×7): 1 via ORAL
  Filled 2017-04-10 (×7): qty 1

## 2017-04-10 MED ORDER — POTASSIUM CHLORIDE CRYS ER 20 MEQ PO TBCR
40.0000 meq | EXTENDED_RELEASE_TABLET | Freq: Once | ORAL | Status: AC
  Administered 2017-04-10: 40 meq via ORAL
  Filled 2017-04-10: qty 2

## 2017-04-10 NOTE — Progress Notes (Signed)
PROGRESS NOTE    Sharon Warner  JJO:[id number] DOB: May 06, 1951 DOA: 04/04/2017 PCP: [name], MD    Brief Narrative:  66 y.o. female with Past medical history of paraplegia, neurogenic bladder, wheelchair-bound, chronic indwelling Foley catheter, sacral decubitus ulcer, morbid obesity, HTN, type II DM, sacral osteomyelitis, chronic diastolic CHF. Patient presented with complains of fever. In her usual health yesterday. This morning when she woke up and the home health nurse came to see her she had a temp of 101.4 and she was asked to go to ER. Denies having any complaints of chills, nausea, vomiting, abdominal pain, chest pain, headache, dizziness. No cough. No recent change in medications. Her Foley catheter is past due for a change for 2 days. She has a colostomy bag which was last emptied on Wednesday. Denies any recent change in medication other than decreasing Lopressor which was done almost a month ago.  ED Course: Presented with fever, was febrile with tachycardia, UA was showing pyuria and patient was started on antibiotics and was referred for admission  Assessment & Plan:   Principal Problem:   Sepsis ([place]) Active Problems:   Morbid obesity ([place])   HTN (hypertension)   Paraplegia (HCC)   Neurogenic bladder   Obstructive sleep apnea   Protein-calorie malnutrition (HCC)   Hypercalcemia   Chronic diastolic heart failure (HCC)   Sacral decubitus ulcer, stage IV ([place])  UTI with sepsis present on admission [facility]) likely associated with foley cath, bacteremia -Pt presents with fever, leukocytosis, sinus tachycardia and evidence of pyuria the patient is meeting sepsis criteria. -Last year in June 2017 patient had polymicrobial bacteremia as well as sacral osteomyelitis and was treated with ceftriaxone. -foley cath changed at time of admission -cultures thus far pos for proteus and strep species. Proteus is pan-sensitive -Patient is continued on rocephin -Lactic acid  within normal limits -Sepsis physiology resolving -BP improved and pt with signs of volume overload. Holding further IVF and diuresing as tolerated  Sinus tachycardia/bradycardia. -Pt presented with heart rate persistently in the range of 130s and 140s, sinus on telemetry. -Likely sequela of presenting UTI with sepsis versus rebound tachycardia after holding beta blocker secondary to hypertension -Tachycardia resolved with resumption of beta blocker, however on 7/10, patient noted to be transiently profoundly bradycardic -keep mag>2, k >4, tsh 2.1, get echocardiogram, cardiology consulted  acute on Chronic combined systolic and diastolic heart failure. --Patient had been on torsemide prior to hospital admission. -Given presenting sepsis, diuretics were initially held and patient was continued on aggressive IV fluid hydration. Patient is clinically volume overloaded. Chest x-ray reviewed, with findings of developing pulmonary edema -echo with reduced ef 25-30%, cardiology consulted, she is currently on lasix bid, coreg and losartan, meds per cardiology, cardiology input appreciated  Hypokalemia: replace k   Hypercalcemia. -ca 11.3 on admission -Etiology remains unclear. -Home vitamin D supplementation on hold -Calcium levels have normalized  Type 2 diabetes mellitus. -On sensitive sliding scale. -Not on diabetic medications prior to admission -Labs reviewed. Currently stable   OSA. -Continue CPAP Daily at bedtime. -Currently stable  Hoarse voice -Unclear etiology -Pt denies coughing, sore throat -Question laryngitis  -Continue to monitor -soft tissue neck CT no acute findings, rapid strep test negative  Bedbound,  paraplegia,  Chronic stage 4 pressure injury to sacrum presented on admission, s/o colostomy and indwelling foley Wound care consulted  DVT prophylaxis: Lovenox subcutaneous Code Status: Full code Family Communication: Patient in room, husband at  bedside Disposition Plan: remain in stepdown due to  tachy/brady  Consultants:   cardiology  Procedures:   none  Antimicrobials: Anti-infectives    Start     Dose/Rate Route Frequency Ordered Stop   04/06/17 1400  cefTRIAXone (ROCEPHIN) 2 g in dextrose 5 % 50 mL IVPB     2 g 100 mL/hr over 30 Minutes Intravenous Every 24 hours 04/05/17 1417     04/05/17 1400  cefTRIAXone (ROCEPHIN) 1 g in dextrose 5 % 50 mL IVPB  Status:  Discontinued     1 g 100 mL/hr over 30 Minutes Intravenous Every 24 hours 04/04/17 1235 04/05/17 1417   04/04/17 1230  cefTRIAXone (ROCEPHIN) 2 g in dextrose 5 % 50 mL IVPB     2 g 100 mL/hr over 30 Minutes Intravenous  Once 04/04/17 1220 04/04/17 2213      Subjective: remain hoarse voice Heart rate stable No fever Husband at bedside  Objective: Vitals:   04/10/17 0700 04/10/17 0800 04/10/17 1000 04/10/17 1100  BP: (!) 100/57  (!) 108/57 (!) 99/53  Pulse: 81  84 83  Resp: (!) 26  (!) 25 (!) 31  Temp:  97.8 F (36.6 C)    TempSrc:  Oral    SpO2: 100%  100% 98%  Weight:      Height:        Intake/Output Summary (Last 24 hours) at 04/10/17 1150 Last data filed at 04/10/17 0100  Gross per 24 hour  Intake              365 ml  Output             2785 ml  Net            -2420 ml   Filed Weights   04/08/17 0500 04/09/17 0333 04/10/17 0437  Weight: 81.4 kg (179 lb 7.3 oz) 77.4 kg (170 lb 10.2 oz) 75.6 kg (166 lb 10.7 oz)    Examination: General exam: chronically ill, Awake, laying in bed, in nad, hoarse voice Respiratory system: decreased breath sounds at bilateral base,  no wheezing, no rales, no rhonchi Cardiovascular system: regular rate, s1, s2 Gastrointestinal system: Soft, nondistended, positive BS, + colostomy bag with hard stool, indwelling foley Central nervous system: chronic paraplegia, no sensation from waist down,  Extremities:+bilateral heal protection  Skin: Chronic stage 4 pressure injury to sacrum presented on admission,   Psychiatry: Mood normal // no visual hallucinations    Data Reviewed: I have personally reviewed following labs and imaging studies  CBC:  Recent Labs Lab 04/04/17 1242 04/05/17 0348 04/06/17 0101 04/07/17 0310 04/08/17 0318 04/09/17 0301  WBC 10.6* 11.5* 9.5 8.0 10.4 9.5  NEUTROABS 7.4 9.0*  --   --   --   --   HGB 11.8* 9.2* 9.5* 9.1* 9.7* 9.8*  HCT 37.4 29.3* 30.3* 29.3* 31.5* 31.5*  MCV 77.3* 76.9* 76.7* 75.3* 77.0* 77.6*  PLT 373 281 258 254 285 401   Basic Metabolic Panel:  Recent Labs Lab 04/05/17 0348 04/06/17 0101 04/07/17 0310 04/08/17 0318 04/09/17 0301 04/10/17 0316  NA 134* 136 135 137 139 142  K 3.3* 4.1 3.7 3.6 3.5 3.4*  CL 105 110 114* 110 105 103  CO2 20* 20* 16* 20* 25 30  GLUCOSE 141* 149* 139* 151* 126* 125*  BUN 19 20 19 20  22* 24*  CREATININE 0.60 0.61 0.54 0.52 0.58 0.66  CALCIUM 9.5 9.7 9.5 9.9 10.0 9.8  MG 1.4*  --   --  1.8  --  2.2   GFR:  Estimated Creatinine Clearance: 66.1 mL/min (by C-G formula based on SCr of 0.66 mg/dL). Liver Function Tests:  Recent Labs Lab 04/04/17 1242 04/05/17 0348  AST 34 22  ALT 12* 14  ALKPHOS 68 56  BILITOT 1.4* 0.6  PROT 7.2 5.7*  ALBUMIN 3.4* 2.7*   No results for input(s): LIPASE, AMYLASE in the last 168 hours. No results for input(s): AMMONIA in the last 168 hours. Coagulation Profile:  Recent Labs Lab 04/04/17 1242  INR 1.24   Cardiac Enzymes: No results for input(s): CKTOTAL, CKMB, CKMBINDEX, TROPONINI in the last 168 hours. BNP (last 3 results) No results for input(s): PROBNP in the last 8760 hours. HbA1C: No results for input(s): HGBA1C in the last 72 hours. CBG:  Recent Labs Lab 04/09/17 0805 04/09/17 1225 04/09/17 1631 04/09/17 2111 04/10/17 0746  GLUCAP 116* 117* 118* 164* 104*   Lipid Profile: No results for input(s): CHOL, HDL, LDLCALC, TRIG, CHOLHDL, LDLDIRECT in the last 72 hours. Thyroid Function Tests: No results for input(s): TSH, T4TOTAL, FREET4, T3FREE,  THYROIDAB in the last 72 hours. Anemia Panel: No results for input(s): VITAMINB12, FOLATE, FERRITIN, TIBC, IRON, RETICCTPCT in the last 72 hours. Sepsis Labs:  Recent Labs Lab 04/04/17 1251 04/04/17 1556 04/05/17 0719 04/07/17 0310 04/09/17 0301  PROCALCITON  --   --  0.61 1.55 1.36  LATICACIDVEN 1.89 0.73 0.7  --   --     Recent Results (from the past 240 hour(s))  Culture, blood (Routine x 2)     Status: Abnormal   Collection Time: 04/04/17 12:42 PM  Result Value Ref Range Status   Specimen Description BLOOD BLOOD RIGHT ARM  Final   Special Requests   Final    BOTTLES DRAWN AEROBIC AND ANAEROBIC Blood Culture adequate volume   Culture  Setup Time   Final    GRAM POSITIVE COCCI IN PAIRS IN CHAINS GRAM NEGATIVE RODS AEROBIC BOTTLE ONLY CRITICAL RESULT CALLED TO, READ BACK BY AND VERIFIED WITH: A PHAM,PHARMD AT 1437 04/05/17 BY L BENFIELD Performed at [facility], [place] [address], [place], [place] [address]    Culture (A)  Final    PROTEUS MIRABILIS GROUP B STREP(S.AGALACTIAE)ISOLATED    Report Status 04/09/2017 FINAL  Final   Organism ID, Bacteria PROTEUS MIRABILIS  Final   Organism ID, Bacteria GROUP B STREP(S.AGALACTIAE)ISOLATED  Final      Susceptibility   Group b strep(s.agalactiae)isolated - MIC*    CLINDAMYCIN >=1 RESISTANT Resistant     AMPICILLIN <=0.25 SENSITIVE Sensitive     ERYTHROMYCIN >=8 RESISTANT Resistant     VANCOMYCIN 0.5 SENSITIVE Sensitive     CEFTRIAXONE <=0.12 SENSITIVE Sensitive     LEVOFLOXACIN 1 SENSITIVE Sensitive     * GROUP B STREP(S.AGALACTIAE)ISOLATED   Proteus mirabilis - MIC*    AMPICILLIN <=2 SENSITIVE Sensitive     CEFAZOLIN 8 SENSITIVE Sensitive     CEFEPIME <=1 SENSITIVE Sensitive     CEFTAZIDIME <=1 SENSITIVE Sensitive     CEFTRIAXONE <=1 SENSITIVE Sensitive     CIPROFLOXACIN 0.5 SENSITIVE Sensitive     GENTAMICIN <=1 SENSITIVE Sensitive     IMIPENEM 4 SENSITIVE Sensitive     TRIMETH/SULFA <=20 SENSITIVE Sensitive      AMPICILLIN/SULBACTAM <=2 SENSITIVE Sensitive     PIP/TAZO <=4 SENSITIVE Sensitive     * PROTEUS MIRABILIS  Blood Culture ID Panel (Reflexed)     Status: Abnormal   Collection Time: 04/04/17 12:42 PM  Result Value Ref Range Status   Enterococcus species  NOT DETECTED NOT DETECTED Final   Listeria monocytogenes NOT DETECTED NOT DETECTED Final   Staphylococcus species NOT DETECTED NOT DETECTED Final   Staphylococcus aureus NOT DETECTED NOT DETECTED Final   Streptococcus species DETECTED (A) NOT DETECTED Final    Comment: CRITICAL RESULT CALLED TO, READ BACK BY AND VERIFIED WITH: A PHAM,PHARMD AT 1437 04/05/17 BY L BENFIELD    Streptococcus agalactiae DETECTED (A) NOT DETECTED Final    Comment: CRITICAL RESULT CALLED TO, READ BACK BY AND VERIFIED WITH: A PHAM,PHARMD AT 1437 04/05/17 BY L BENFIELD    Streptococcus pneumoniae NOT DETECTED NOT DETECTED Final   Streptococcus pyogenes NOT DETECTED NOT DETECTED Final   Acinetobacter baumannii NOT DETECTED NOT DETECTED Final   Enterobacteriaceae species DETECTED (A) NOT DETECTED Final    Comment: Enterobacteriaceae represent a large family of gram-negative bacteria, not a single organism. CRITICAL RESULT CALLED TO, READ BACK BY AND VERIFIED WITH: A PHAM,PHARMD AT 1437 04/05/17 BY L BENFIELD    Enterobacter cloacae complex NOT DETECTED NOT DETECTED Final   Escherichia coli NOT DETECTED NOT DETECTED Final   Klebsiella oxytoca NOT DETECTED NOT DETECTED Final   Klebsiella pneumoniae NOT DETECTED NOT DETECTED Final   Proteus species DETECTED (A) NOT DETECTED Final    Comment: CRITICAL RESULT CALLED TO, READ BACK BY AND VERIFIED WITH: A PHAM,PHARMD AT 1437 04/05/17 BY L BENFIELD    Serratia marcescens NOT DETECTED NOT DETECTED Final   Carbapenem resistance NOT DETECTED NOT DETECTED Final   Haemophilus influenzae NOT DETECTED NOT DETECTED Final   Neisseria meningitidis NOT DETECTED NOT DETECTED Final   Pseudomonas aeruginosa NOT DETECTED NOT DETECTED  Final   Candida albicans NOT DETECTED NOT DETECTED Final   Candida glabrata NOT DETECTED NOT DETECTED Final   Candida krusei NOT DETECTED NOT DETECTED Final   Candida parapsilosis NOT DETECTED NOT DETECTED Final   Candida tropicalis NOT DETECTED NOT DETECTED Final    Comment: Performed at [facility], [place] [address], [place], [place] [address]  Urine culture     Status: Abnormal   Collection Time: 04/04/17  3:43 PM  Result Value Ref Range Status   Specimen Description URINE, CATHETERIZED  Final   Special Requests NONE  Final   Culture MULTIPLE ORGANISMS PRESENT, NONE PREDOMINANT (A)  Final   Report Status 04/05/2017 FINAL  Final  Culture, blood (Routine x 2)     Status: None   Collection Time: 04/04/17  6:54 PM  Result Value Ref Range Status   Specimen Description BLOOD LEFT ANTECUBITAL  Final   Special Requests IN PEDIATRIC BOTTLE Blood Culture adequate volume  Final   Culture   Final    NO GROWTH 5 DAYS Performed at [facility], [place] [address] [place], [place] [address]    Report Status 04/09/2017 FINAL  Final  MRSA PCR Screening     Status: None   Collection Time: 04/04/17  6:58 PM  Result Value Ref Range Status   MRSA by PCR NEGATIVE NEGATIVE Final    Comment:        The GeneXpert MRSA Assay (FDA approved for NASAL specimens only), is one component of a comprehensive MRSA colonization surveillance program. It is not intended to diagnose MRSA infection nor to guide or monitor treatment for MRSA infections.   Rapid strep screen (not at [facility])     Status: None   Collection Time: 04/09/17  9:18 AM  Result Value Ref Range Status   Streptococcus, Group A Screen (Direct) NEGATIVE  NEGATIVE Final    Comment: (NOTE) A Rapid Antigen test may result negative if the antigen level in the sample is below the detection level of this test. The FDA has not cleared this test as a stand-alone test therefore the rapid antigen negative result has reflexed to a Group A Strep  culture.   Culture, group A strep     Status: None (Preliminary result)   Collection Time: 04/09/17  9:18 AM  Result Value Ref Range Status   Specimen Description THROAT  Final   Special Requests NONE Reflexed from [id number]  Final   Culture   Final    CULTURE REINCUBATED FOR BETTER GROWTH Performed at [facility], [place] [address], [place], [place] [address]    Report Status PENDING  Incomplete  Culture, blood (routine x 2)     Status: None (Preliminary result)   Collection Time: 04/09/17  9:46 AM  Result Value Ref Range Status   Specimen Description BLOOD LEFT ARM  Final   Special Requests IN PEDIATRIC BOTTLE Blood Culture adequate volume  Final   Culture   Final    NO GROWTH 1 DAY Performed at [facility], [place] [address] [place], [place] [address]    Report Status PENDING  Incomplete  Culture, blood (routine x 2)     Status: None (Preliminary result)   Collection Time: 04/09/17  9:46 AM  Result Value Ref Range Status   Specimen Description BLOOD RIGHT ARM  Final   Special Requests IN PEDIATRIC BOTTLE Blood Culture adequate volume  Final   Culture  Setup Time   Final    GRAM POSITIVE COCCI IN CLUSTERS IN PEDIATRIC BOTTLE Organism ID to follow Performed at [facility], [place] [address] [place],  [address]    Culture GRAM POSITIVE COCCI  Final   Report Status PENDING  Incomplete     Radiology Studies: Ct Soft Tissue Neck W Contrast  Result Date: 04/08/2017 CLINICAL DATA:  Dysphagia. History of paraplegia, neurogenic bladder with indwelling catheter, colostomy bag, sacral osteomyelitis, diabetes. EXAM: CT NECK WITH CONTRAST TECHNIQUE: Multidetector CT imaging of the neck was performed using the standard protocol following the bolus administration of intravenous contrast. CONTRAST:  42mL ISOVUE-300 IOPAMIDOL (ISOVUE-300) INJECTION 61% COMPARISON:  Chest radiograph April 07, 2017 and CT chest April 05, 2017 FINDINGS: Moderately motion degraded examination.  Streak artifact from multiple overlying lines. PHARYNX AND LARYNX: Normal.  Widely patent airway. SALIVARY GLANDS: Normal. THYROID: 2 x 2 cm complex LEFT thyroid nodule with coarse calcifications. Smaller RIGHT thyroid nodule present. Heterogeneous thyroid wound. LYMPH NODES: No lymphadenopathy by CT size criteria. VASCULAR: Normal. LIMITED INTRACRANIAL: Normal. VISUALIZED ORBITS: Normal. MASTOIDS AND VISUALIZED PARANASAL SINUSES: Well-aerated. SKELETON: Nonacute. Severe C5-6 disc height loss and endplate spurring compatible with degenerative discs. UPPER CHEST: Large RIGHT and small LEFT pleural effusions with venous congestion incompletely assessed. OTHER: None. IMPRESSION: 1. No acute process in the neck on this moderately motion degraded examination. Patent airway. 2. Increasing large RIGHT and small LEFT pleural effusions, and congestion consistent with pulmonary edema. 3. **An incidental finding of potential clinical significance has been found. 2.1 cm LEFT thyroid nodule. Recommend thyroid ultrasound on nonemergent basis. This follows ACR consensus guidelines: Managing Incidental Thyroid Nodules Detected on Imaging: White Paper of the ACR Incidental Thyroid Findings Committee. J Am Coll Radiol 2015; 12:143-150. ** Electronically Signed   By: Elon Alas M.D.   On: 04/08/2017 18:53    Scheduled Meds: . acidophilus  2  capsule Oral TID  . aspirin EC  81 mg Oral Daily  . carvedilol  6.25 mg Oral BID WC  . enoxaparin (LOVENOX) injection  40 mg Subcutaneous Q24H  . feeding supplement (ENSURE ENLIVE)  237 mL Oral BID BM  . fluticasone  2 spray Each Nare Daily  . furosemide  40 mg Intravenous BID  . insulin aspart  0-15 Units Subcutaneous TID WC  . insulin aspart  0-5 Units Subcutaneous QHS  . losartan  25 mg Oral Daily  . mouth rinse  15 mL Mouth Rinse BID  . multivitamin with minerals  1 tablet Oral Daily  . senna-docusate  1 tablet Oral BID  . sodium chloride flush  3 mL Intravenous Q12H    Continuous Infusions: . cefTRIAXone (ROCEPHIN)  IV Stopped (04/09/17 1403)     LOS: 6 days    Time spent> 58mins  Shantel Helwig, MD PhD [facility] Pager [phone]  If 7PM-7AM, please contact night-coverage [email or web address] Password [email or web address] 04/10/2017, 11:50 AM

## 2017-04-10 NOTE — Progress Notes (Signed)
Pt. resting comfortably in no distress, refused CPAP, RT to monitor.

## 2017-04-10 NOTE — Progress Notes (Signed)
PHARMACY - PHYSICIAN COMMUNICATION CRITICAL VALUE ALERT - BLOOD CULTURE IDENTIFICATION (BCID)  Results for orders placed or performed during the hospital encounter of 04/04/17  Blood Culture ID Panel (Reflexed) (Collected: 04/09/2017  9:46 AM)  Result Value Ref Range   Enterococcus species NOT DETECTED NOT DETECTED   Listeria monocytogenes NOT DETECTED NOT DETECTED   Staphylococcus species DETECTED (A) NOT DETECTED   Staphylococcus aureus NOT DETECTED NOT DETECTED   Methicillin resistance DETECTED (A) NOT DETECTED   Streptococcus species NOT DETECTED NOT DETECTED   Streptococcus agalactiae NOT DETECTED NOT DETECTED   Streptococcus pneumoniae NOT DETECTED NOT DETECTED   Streptococcus pyogenes NOT DETECTED NOT DETECTED   Acinetobacter baumannii NOT DETECTED NOT DETECTED   Enterobacteriaceae species NOT DETECTED NOT DETECTED   Enterobacter cloacae complex NOT DETECTED NOT DETECTED   Escherichia coli NOT DETECTED NOT DETECTED   Klebsiella oxytoca NOT DETECTED NOT DETECTED   Klebsiella pneumoniae NOT DETECTED NOT DETECTED   Proteus species NOT DETECTED NOT DETECTED   Serratia marcescens NOT DETECTED NOT DETECTED   Haemophilus influenzae NOT DETECTED NOT DETECTED   Neisseria meningitidis NOT DETECTED NOT DETECTED   Pseudomonas aeruginosa NOT DETECTED NOT DETECTED   Candida albicans NOT DETECTED NOT DETECTED   Candida glabrata NOT DETECTED NOT DETECTED   Candida krusei NOT DETECTED NOT DETECTED   Candida parapsilosis NOT DETECTED NOT DETECTED   Candida tropicalis NOT DETECTED NOT DETECTED    Name of physician (or Provider) Contacted: Dr. Erlinda Hong  Changes to prescribed antibiotics required: none  Hershal Coria 04/10/2017  1:29 PM

## 2017-04-10 NOTE — Progress Notes (Signed)
Progress Note  Patient Name: Vetta Couzens Date of Encounter: 04/10/2017  Primary Cardiologist: New  Subjective   66 yo with hx of paraplegia, HTN Hs a long hx of CHF ( originally diagnosed at an outside hospital )   Went into acute CHF when she was treated with aggressive IV fluids for sepsis   No further episodes of bradycardia or pauses.  Inpatient Medications    Scheduled Meds: . acidophilus  2 capsule Oral TID  . aspirin EC  81 mg Oral Daily  . carvedilol  6.25 mg Oral BID WC  . enoxaparin (LOVENOX) injection  40 mg Subcutaneous Q24H  . feeding supplement (ENSURE ENLIVE)  237 mL Oral BID BM  . fluticasone  2 spray Each Nare Daily  . furosemide  40 mg Intravenous BID  . insulin aspart  0-15 Units Subcutaneous TID WC  . insulin aspart  0-5 Units Subcutaneous QHS  . losartan  25 mg Oral Daily  . mouth rinse  15 mL Mouth Rinse BID  . multivitamin with minerals  1 tablet Oral Daily  . sodium chloride flush  3 mL Intravenous Q12H   Continuous Infusions: . cefTRIAXone (ROCEPHIN)  IV Stopped (04/09/17 1403)   PRN Meds: acetaminophen **OR** acetaminophen, iopamidol, labetalol, naproxen sodium, ondansetron **OR** ondansetron (ZOFRAN) IV, traMADol-acetaminophen   Vital Signs    Vitals:   04/10/17 0437 04/10/17 0500 04/10/17 0600 04/10/17 0700  BP:  97/60 95/61 (!) 100/57  Pulse:  74 81 81  Resp:  (!) 25 (!) 24 (!) 26  Temp:      TempSrc:      SpO2:  100% 100% 100%  Weight: 166 lb 10.7 oz (75.6 kg)     Height:        Intake/Output Summary (Last 24 hours) at 04/10/17 0913 Last data filed at 04/10/17 0100  Gross per 24 hour  Intake              385 ml  Output             3460 ml  Net            -3075 ml   Filed Weights   04/08/17 0500 04/09/17 0333 04/10/17 0437  Weight: 179 lb 7.3 oz (81.4 kg) 170 lb 10.2 oz (77.4 kg) 166 lb 10.7 oz (75.6 kg)    Telemetry    Sinus rhythm, rates controlled in 70-80s. No further pauses or episodes of bradycardia - Personally  Reviewed   Physical Exam   General: Well developed, well nourished, in no acute distress. Head: Normocephalic, atraumatic, Neck: Negative for carotid bruits. JVD not elevated. Hoarse Lungs: Crackles to lung basis, normal effort of breathing Heart: RRR Abdomen: Soft, non-tender, + colostomy and indwelling foley. Neuro: Alert and oriented X 3. Paraplegic to LE. + le edema.  Labs    Chemistry Recent Labs Lab 04/04/17 1242  04/05/17 0348  04/08/17 0318 04/09/17 0301 04/10/17 0316  NA 135  --  134*  < > 137 139 142  K 4.3  --  3.3*  < > 3.6 3.5 3.4*  CL 101  --  105  < > 110 105 103  CO2 25  --  20*  < > 20* 25 30  GLUCOSE 145*  --  141*  < > 151* 126* 125*  BUN 21*  --  19  < > 20 22* 24*  CREATININE 0.70  --  0.60  < > 0.52 0.58 0.66  CALCIUM 10.8*  < >  9.5  < > 9.9 10.0 9.8  PROT 7.2  --  5.7*  --   --   --   --   ALBUMIN 3.4*  --  2.7*  --   --   --   --   AST 34  --  22  --   --   --   --   ALT 12*  --  14  --   --   --   --   ALKPHOS 68  --  56  --   --   --   --   BILITOT 1.4*  --  0.6  --   --   --   --   GFRNONAA >60  --  >60  < > >60 >60 >60  GFRAA >60  --  >60  < > >60 >60 >60  ANIONGAP 9  --  9  < > 7 9 9   < > = values in this interval not displayed.   Hematology Recent Labs Lab 04/07/17 0310 04/08/17 0318 04/09/17 0301  WBC 8.0 10.4 9.5  RBC 3.89 4.09 4.06  HGB 9.1* 9.7* 9.8*  HCT 29.3* 31.5* 31.5*  MCV 75.3* 77.0* 77.6*  MCH 23.4* 23.7* 24.1*  MCHC 31.1 30.8 31.1  RDW 16.4* 16.6* 16.9*  PLT 254 285 311    Cardiac EnzymesNo results for input(s): TROPONINI in the last 168 hours. No results for input(s): TROPIPOC in the last 168 hours.   BNPNo results for input(s): BNP, PROBNP in the last 168 hours.   DDimer No results for input(s): DDIMER in the last 168 hours.   Radiology    Ct Soft Tissue Neck W Contrast  Result Date: 04/08/2017 CLINICAL DATA:  Dysphagia. History of paraplegia, neurogenic bladder with indwelling catheter, colostomy bag,  sacral osteomyelitis, diabetes. EXAM: CT NECK WITH CONTRAST TECHNIQUE: Multidetector CT imaging of the neck was performed using the standard protocol following the bolus administration of intravenous contrast. CONTRAST:  20mL ISOVUE-300 IOPAMIDOL (ISOVUE-300) INJECTION 61% COMPARISON:  Chest radiograph April 07, 2017 and CT chest April 05, 2017 FINDINGS: Moderately motion degraded examination. Streak artifact from multiple overlying lines. PHARYNX AND LARYNX: Normal.  Widely patent airway. SALIVARY GLANDS: Normal. THYROID: 2 x 2 cm complex LEFT thyroid nodule with coarse calcifications. Smaller RIGHT thyroid nodule present. Heterogeneous thyroid wound. LYMPH NODES: No lymphadenopathy by CT size criteria. VASCULAR: Normal. LIMITED INTRACRANIAL: Normal. VISUALIZED ORBITS: Normal. MASTOIDS AND VISUALIZED PARANASAL SINUSES: Well-aerated. SKELETON: Nonacute. Severe C5-6 disc height loss and endplate spurring compatible with degenerative discs. UPPER CHEST: Large RIGHT and small LEFT pleural effusions with venous congestion incompletely assessed. OTHER: None. IMPRESSION: 1. No acute process in the neck on this moderately motion degraded examination. Patent airway. 2. Increasing large RIGHT and small LEFT pleural effusions, and congestion consistent with pulmonary edema. 3. **An incidental finding of potential clinical significance has been found. 2.1 cm LEFT thyroid nodule. Recommend thyroid ultrasound on nonemergent basis. This follows ACR consensus guidelines: Managing Incidental Thyroid Nodules Detected on Imaging: White Paper of the ACR Incidental Thyroid Findings Committee. J Am Coll Radiol 2015; 12:143-150. ** Electronically Signed   By: Elon Alas M.D.   On: 04/08/2017 18:53    Cardiac Studies   Echocardiogram 04/09/2017  Study Conclusions  - Left ventricle: Septal and apical akinesis. Inferior and distal   anterior wall hypokinesis. The cavity size was severely dilated.   Wall thickness was increased  in a pattern of moderate LVH.   Systolic function was severely reduced.  The estimated ejection   fraction was in the range of 25% to 30%. - Aortic valve: There was trivial regurgitation. - Left atrium: The atrium was mildly dilated. - Atrial septum: No defect or patent foramen ovale was identified. - Pulmonary arteries: PA peak pressure: 33 mm Hg (S). - Pericardium, extracardiac: Small but nearly circumferential   pericardial effusion no tamponade IVC small.  Patient Profile     Elfreda Blanchet is a 66 y.o. female with a hx of paraplegia, neurogenic bladder, wheelchair bound, chronic indwelling foley catheter, sacral decubitus ulcer, morbid obesity, HTN. Type II diabetes, sacral osteomyelitis, colostomy bag, chronic diastolic CHF. We are seeing patient regarding bradycardia and CHF.  Assessment & Plan     1. Acute on chronic combined CHF: Admitted with sepsis, EF is 25-30% with septal and apical akinesis. Inferior and distal anterior wall hypokinesis. Moderate LVH, small but nearly circumferential pericardial effusion. No chest pains. - consider ischemic work-up as she improves, medical therapy is the best option for now.  At this point she can be restarted on her home medications.   2. Bradycardia:  The pauses were likely due to vagal reaction. Unfortunately she is not a good candidate for a pacer given multiple sites for infection (foley catheter, sacral osteomyelitis and colostomy bag). Her EF is low at 25 to 30 %.  Low dose Coreg 6.25 mg BID initiated. She has not had any further episodes of bradycardia or pauses .   3. UTI/Sepsis: IM managing.   Kristopher Glee, PA-C  04/10/2017, 9:13 AM     Attending Note:   The patient was seen and examined.  Agree with assessment and plan as noted above.  Changes made to the above note as needed.  Patient seen and independently examined with Delos Haring, PA .   We discussed all aspects of the encounter. I agree with the  assessment and plan as stated above.  1.   Acute on chronic  systolic CHF:   She has had chronic systolic CHF for years.  She was aggressively treated for sepsis and went into acute heart failure. Has diuresed well BP has been low ( she is not eating well)  At this point, I would start her home meds  Torsemide 10 mg a day   Kdur 10 meq a day toprol XL 25 mg a day   She will need to follow up with her primary MD    I have spent a total of 40 minutes with patient reviewing hospital  notes , telemetry, EKGs, labs and examining patient as well as establishing an assessment and plan that was discussed with the patient. > 50% of time was spent in direct patient care.    Thayer Headings, Brooke Bonito., MD, [facility] 04/10/2017, 12:47 PM [address]  [place] Pager [phone]

## 2017-04-10 NOTE — Care Management Note (Signed)
Case Management Note  Patient Details  Name: Sharon Warner MRN: [id number] Date of Birth: Aug 16, 1951  Subjective/Objective:  chf                  Action/Plan: Date:  April 10, 2017  Chart reviewed for concurrent status and case management needs.  Will continue to follow patient progress.  Discharge Planning: following for needs  Expected discharge date: [id number]  Velva Harman, BSN, [place], [place]   Expected Discharge Date:                  Expected Discharge Plan:  Home/Self Care  In-House Referral:     Discharge planning Services  CM Consult  Post Acute Care Choice:    Choice offered to:     DME Arranged:    DME Agency:     HH Arranged:    HH Agency:     Status of Service:  In process, will continue to follow  If discussed at Long Length of Stay Meetings, dates discussed:    Additional Comments:  Leeroy Cha, RN 04/10/2017, 9:07 AM

## 2017-04-11 ENCOUNTER — Inpatient Hospital Stay (HOSPITAL_COMMUNITY): Payer: BLUE CROSS/BLUE SHIELD

## 2017-04-11 DIAGNOSIS — J9601 Acute respiratory failure with hypoxia: Secondary | ICD-10-CM

## 2017-04-11 LAB — BASIC METABOLIC PANEL
ANION GAP: 8 (ref 5–15)
BUN: 21 mg/dL — ABNORMAL HIGH (ref 6–20)
CHLORIDE: 98 mmol/L — AB (ref 101–111)
CO2: 31 mmol/L (ref 22–32)
CREATININE: 0.53 mg/dL (ref 0.44–1.00)
Calcium: 9.7 mg/dL (ref 8.9–10.3)
GFR calc non Af Amer: >60 mL/min (ref >60)
GLUCOSE: 122 mg/dL — AB (ref 65–99)
Potassium: 3.5 mmol/L (ref 3.5–5.1)
Sodium: 137 mmol/L (ref 135–145)

## 2017-04-11 LAB — CULTURE, GROUP A STREP (THRC)

## 2017-04-11 LAB — GLUCOSE, CAPILLARY
GLUCOSE-CAPILLARY: 96 mg/dL (ref 65–99)
GLUCOSE-CAPILLARY: 144 mg/dL — AB (ref 65–99)
Glucose-Capillary: 128 mg/dL — ABNORMAL HIGH (ref 65–99)
Glucose-Capillary: 88 mg/dL (ref 65–99)
Glucose-Capillary: 88 mg/dL (ref 65–99)

## 2017-04-11 MED ORDER — POTASSIUM CHLORIDE CRYS ER 20 MEQ PO TBCR
40.0000 meq | EXTENDED_RELEASE_TABLET | Freq: Once | ORAL | Status: AC
  Administered 2017-04-11: 40 meq via ORAL
  Filled 2017-04-11: qty 2

## 2017-04-11 MED ORDER — FUROSEMIDE 10 MG/ML IJ SOLN
40.0000 mg | Freq: Once | INTRAMUSCULAR | Status: AC
  Administered 2017-04-11: 40 mg via INTRAVENOUS
  Filled 2017-04-11: qty 4

## 2017-04-11 MED ORDER — METOPROLOL SUCCINATE ER 25 MG PO TB24
12.5000 mg | ORAL_TABLET | Freq: Every day | ORAL | Status: DC
  Administered 2017-04-11: 12.5 mg via ORAL
  Filled 2017-04-11: qty 1

## 2017-04-11 MED ORDER — IOPAMIDOL (ISOVUE-300) INJECTION 61%
100.0000 mL | Freq: Once | INTRAVENOUS | Status: AC | PRN
  Administered 2017-04-11: 100 mL via INTRAVENOUS

## 2017-04-11 MED ORDER — IOPAMIDOL (ISOVUE-300) INJECTION 61%
INTRAVENOUS | Status: AC
  Filled 2017-04-11: qty 100

## 2017-04-11 NOTE — Consult Note (Signed)
[facility] Consult Note  Sharon Warner 02-23-1951  [id number].    Requesting MD: Erlinda Hong Chief Complaint/Reason for Consult: sacral decubitus  HPI:  Sharon Warner is a 66yo female PMH paraplegia admitted to [facility] 04/04/17 with sepsis of unknown origin. Initially thought to be due to urinary tract infection however urine culture eventually came back negative. Blood culture was positive for proteus and strep species. General surgery asked to consult for evaluation of sacral wound, to see if this could possibly be the cause of her sepsis. Patient states that the wound has been present for about 3 years. She has known underlying sacral osteomyelitis, for which she was treated with rocephin and flagyl in 2017. She is followed in a wound clinic, and states that a nurse comes to her home each day to change the dressing. She is insensate in this area and complains of no pain. She has no other current complaints.   PMH significant for Paraplegia, Neurogenic bladder, chronic indwelling foley catheter, HTN, DM, OSA, CHF  ROS: Review of Systems  Constitutional: Positive for chills.  HENT: Negative.   Eyes: Negative.   Respiratory: Negative.   Cardiovascular: Negative.   Gastrointestinal: Negative.   Genitourinary: Negative.   Musculoskeletal: Negative.   Skin:       Sacral wound  Neurological: Positive for sensory change.  All systems reviewed and otherwise negative except for as above  Family History  Problem Relation Age of Onset  . Hypothyroidism Mother   . Diabetes Maternal Grandmother   . Heart disease Maternal Grandfather     Past Medical History:  Diagnosis Date  . Acute on chronic systolic CHF (congestive heart failure) ([place])   . Chronic heart failure ([place])   . Chronic indwelling Foley catheter    last changed few days ago  . Diabetes mellitus without complication ([place])   . Enlarged thoracic aorta ([place]) 03/14/2016  . H/O paraplegia    ascending paralysis unclear  etiology  . Hypertension   . Lumbar spondylosis   . Morbid obesity ([place])   . OSA on CPAP   . Sacral decubitus ulcer, stage IV [facility])     Past Surgical History:  Procedure Laterality Date  . JOINT REPLACEMENT Right    Hip  . TONSILLECTOMY    . VAGINAL HYSTERECTOMY      Social History:  reports that she has never smoked. She has never used smokeless tobacco. She reports that she does not drink alcohol or use drugs.  Allergies:  Allergies  Allergen Reactions  . Levaquin [Levofloxacin In D5w] Nausea Only    Unknown childhood allergy  . Penicillins Other (See Comments)    Has patient had a PCN reaction causing immediate rash, facial/tongue/throat swelling, SOB or lightheadedness with hypotension: Unknown Has patient had a PCN reaction causing severe rash involving mucus membranes or skin necrosis: Unknown Has patient had a PCN reaction that required hospitalization: No Has patient had a PCN reaction occurring within the last 10 years: No If all of the above answers are "NO", then may proceed with Cephalosporin use.    Medications Prior to Admission  Medication Sig Dispense Refill  . aspirin EC 81 MG tablet Take 81 mg by mouth daily.    Marland Kitchen b complex vitamins tablet Take 1 tablet by mouth daily.    . Biotin 10000 MCG TABS Take 1,000 mcg by mouth daily.    . cholecalciferol (VITAMIN D) 1000 units tablet Take 2,000 Units by mouth daily.     . Lactobacillus TABS  250 mg by mouth bid    . Melatonin 3 MG TABS Take 3 mg by mouth as needed (sleep).     . metoprolol succinate (TOPROL-XL) 25 MG 24 hr tablet Take 25 mg by mouth daily.  6  . naproxen sodium (ANAPROX) 220 MG tablet Take 440 mg by mouth as needed (pain temperature).    . potassium chloride (KLOR-CON 10) 10 MEQ tablet Take 10 mEq by mouth daily.    Marland Kitchen torsemide (DEMADEX) 10 MG tablet Take 10 mg by mouth daily.    . traMADol-acetaminophen (ULTRACET) 37.5-325 MG tablet Take 1 tablet by mouth every 4 (four) hours as needed. for pain   0  . TURMERIC PO Take 1,500 mg by mouth daily.    Marland Kitchen zinc gluconate 50 MG tablet Take 50 mg by mouth daily.      Prior to Admission medications   Medication Sig Start Date End Date Taking? Authorizing Provider  aspirin EC 81 MG tablet Take 81 mg by mouth daily.   Yes [provider]  b complex vitamins tablet Take 1 tablet by mouth daily.   Yes [provider]  Biotin 10000 MCG TABS Take 1,000 mcg by mouth daily.   Yes [provider]  cholecalciferol (VITAMIN D) 1000 units tablet Take 2,000 Units by mouth daily.    Yes [provider]  Lactobacillus TABS 250 mg by mouth bid   Yes [provider]  Melatonin 3 MG TABS Take 3 mg by mouth as needed (sleep).    Yes [provider]  metoprolol succinate (TOPROL-XL) 25 MG 24 hr tablet Take 25 mg by mouth daily. 03/26/17  Yes [provider]  naproxen sodium (ANAPROX) 220 MG tablet Take 440 mg by mouth as needed (pain temperature).   Yes [provider]  potassium chloride (KLOR-CON 10) 10 MEQ tablet Take 10 mEq by mouth daily.   Yes [provider]  torsemide (DEMADEX) 10 MG tablet Take 10 mg by mouth daily.   Yes [provider]  traMADol-acetaminophen (ULTRACET) 37.5-325 MG tablet Take 1 tablet by mouth every 4 (four) hours as needed. for pain 03/06/17  Yes [provider]  TURMERIC PO Take 1,500 mg by mouth daily.   Yes [provider]  zinc gluconate 50 MG tablet Take 50 mg by mouth daily.   Yes [provider]    Blood pressure 105/60, pulse 84, temperature 98.5 F (36.9 C), temperature source Oral, resp. rate (!) 29, height 5' 1" (1.549 m), weight 166 lb 3.6 oz (75.4 kg), SpO2 100 %. Physical Exam: General: pleasant, WD/WN white female who is laying in bed in NAD HEENT: head is normocephalic, atraumatic.  Sclera are noninjected.  Pupils equal and round.  Ears and nose without any masses or lesions.  Mouth is pink and moist.  Dentition fair Heart: regular, rate, and rhythm.  No obvious murmurs, gallops, or rubs noted.  Palpable pedal pulses bilaterally Lungs: CTAB, no wheezes, rhonchi, or rales noted.  Respiratory effort nonlabored Abd: well healed midline incision, soft, NT/ND, +BS, no masses, hernias, or organomegaly, ostomy LLQ pink and viable with stool in bag MS: all 4 extremities are symmetrical with no cyanosis or clubbing. Mild edema noted in extremities Skin: warm and dry with no masses, lesions, or rashes Psych: A&Ox3 with an appropriate affect. Neuro: cranial nerves grossly intact, upper extremity CSM intact bilaterally, normal speech Sacrum: sacral decubitus wound 4.5x5x3cm with about 4cm tracking laterally to the left and right. Trace bloody  drainage but I do not see any purulent drainage or foul odor. Surrounding tissue intact with no erythema.     Results for orders placed or performed during the hospital encounter of 04/04/17 (from the past 48 hour(s))  Glucose, capillary     Status: Abnormal   Collection Time: 04/09/17  4:31 PM  Result Value Ref Range   Glucose-Capillary 118 (H) 65 - 99 mg/dL  Glucose, capillary     Status: Abnormal   Collection Time: 04/09/17  9:11 PM  Result Value Ref Range   Glucose-Capillary 164 (H) 65 - 99 mg/dL   Comment 1 Notify RN    Comment 2 Document in Chart   Basic metabolic panel     Status: Abnormal   Collection Time: 04/10/17  3:16 AM  Result Value Ref Range   Sodium 142 135 - 145 mmol/L   Potassium 3.4 (L) 3.5 - 5.1 mmol/L   Chloride 103 101 - 111 mmol/L   CO2 30 22 - 32 mmol/L   Glucose, Bld 125 (H) 65 - 99 mg/dL   BUN 24 (H) 6 - 20 mg/dL   Creatinine, Ser 0.66 0.44 - 1.00 mg/dL   Calcium 9.8 8.9 - 10.3 mg/dL   GFR calc non Af Amer >60 >60 mL/min   GFR calc Af Amer >60 >60 mL/min    Comment: (NOTE) The eGFR has been calculated using the CKD EPI equation. This calculation has not been validated in all clinical situations. eGFR's persistently <60  mL/min signify possible Chronic Kidney Disease.    Anion gap 9 5 - 15  Magnesium     Status: None   Collection Time: 04/10/17  3:16 AM  Result Value Ref Range   Magnesium 2.2 1.7 - 2.4 mg/dL  Glucose, capillary     Status: Abnormal   Collection Time: 04/10/17  7:46 AM  Result Value Ref Range   Glucose-Capillary 104 (H) 65 - 99 mg/dL   Comment 1 Notify RN    Comment 2 Document in Chart   Glucose, capillary     Status: Abnormal   Collection Time: 04/10/17 12:04 PM  Result Value Ref Range   Glucose-Capillary 104 (H) 65 - 99 mg/dL   Comment 1 Notify RN    Comment 2 Document in Chart   Glucose, capillary     Status: Abnormal   Collection Time: 04/10/17  4:17 PM  Result Value Ref Range   Glucose-Capillary 133 (H) 65 - 99 mg/dL   Comment 1 Notify RN    Comment 2 Document in Chart   Glucose, capillary     Status: Abnormal   Collection Time: 04/10/17  9:08 PM  Result Value Ref Range   Glucose-Capillary 101 (H) 65 - 99 mg/dL   Comment 1 Notify RN    Comment 2 Document in Chart   Basic metabolic panel     Status: Abnormal   Collection Time: 04/11/17  2:54 AM  Result Value Ref Range   Sodium 137 135 - 145 mmol/L   Potassium 3.5 3.5 - 5.1 mmol/L   Chloride 98 (L) 101 - 111 mmol/L   CO2 31 22 - 32 mmol/L   Glucose, Bld 122 (H) 65 - 99 mg/dL   BUN 21 (H) 6 - 20 mg/dL   Creatinine, Ser 0.53 0.44 - 1.00 mg/dL   Calcium 9.7 8.9 - 10.3 mg/dL   GFR calc non Af Amer >60 >60 mL/min   GFR calc Af Amer >60 >60 mL/min    Comment: (NOTE) The  eGFR has been calculated using the CKD EPI equation. This calculation has not been validated in all clinical situations. eGFR's persistently <60 mL/min signify possible Chronic Kidney Disease.    Anion gap 8 5 - 15  Glucose, capillary     Status: None   Collection Time: 04/11/17  8:05 AM  Result Value Ref Range   Glucose-Capillary 96 65 - 99 mg/dL  Glucose, capillary     Status: Abnormal   Collection Time: 04/11/17 11:59 AM  Result Value Ref Range    Glucose-Capillary 128 (H) 65 - 99 mg/dL  Glucose, capillary     Status: None   Collection Time: 04/11/17  3:07 PM  Result Value Ref Range   Glucose-Capillary 88 65 - 99 mg/dL   Comment 1 Notify RN    Comment 2 Document in Chart    No results found.  Anti-infectives    Start     Dose/Rate Route Frequency Ordered Stop   04/06/17 1400  cefTRIAXone (ROCEPHIN) 2 g in dextrose 5 % 50 mL IVPB     2 g 100 mL/hr over 30 Minutes Intravenous Every 24 hours 04/05/17 1417     04/05/17 1400  cefTRIAXone (ROCEPHIN) 1 g in dextrose 5 % 50 mL IVPB  Status:  Discontinued     1 g 100 mL/hr over 30 Minutes Intravenous Every 24 hours 04/04/17 1235 04/05/17 1417   04/04/17 1230  cefTRIAXone (ROCEPHIN) 2 g in dextrose 5 % 50 mL IVPB     2 g 100 mL/hr over 30 Minutes Intravenous  Once 04/04/17 1220 04/04/17 2213       Assessment/Plan Paraplegia Neurogenic bladder, chronic indwelling foley catheter Morbid obesity HTN DM OSA CHF  Bacteremia, sepsis Sacral decubitus  - chronic in nature, has been present for about 3 years - known underlying osteomyelitis, treated with rocephin and flagyl in 2017  ID - rocephin 7/6>> VTE - lovenox FEN - carb modified diet  Plan - Wound does not appear that it would benefit from any surgical debridement. Recommend daily wet to dry dressing changes, and follow up with current wound care specialist as outpatient. Recommend avoiding direct sacral pressure. Will await CT pelvis for final recommendations.  Jerrye Beavers, [facility] 04/11/2017, 3:45 PM Pager: [phone] Consults: [phone] Mon-Fri 7:00 am-4:30 pm Sat-Sun 7:00 am-11:30 am

## 2017-04-11 NOTE — Clinical Social Work Note (Signed)
Clinical Social Work Assessment  Patient Details  Name: Sharon Warner MRN: [id number] Date of Birth: April 20, 1951  Date of referral:  04/11/17               Reason for consult:  Facility Placement                Permission sought to share information with:    Permission granted to share information::  Yes, Release of Information Signed  Name::        Agency::     Relationship::     Contact Information:     Housing/Transportation Living arrangements for the past 2 months:  Single Family Home Source of Information:  Patient Patient Interpreter Needed:  None Criminal Activity/Legal Involvement Pertinent to Current Situation/Hospitalization:    Significant Relationships:  Siblings, Adult Children Lives with:  Self Do you feel safe going back to the place where you live?  Yes Need for family participation in patient care:  No (Coment)  Care giving concerns:  None listed by pt/family   Social Worker assessment / plan:  CSW met with pt and confirmed pt's plan to be discharged directly home to live at discharge.  CSW provided active listening and validated pt's concerns that she only be allowed to return home.   CSW Dept was NOT given permission to complete FL-2 and send referrals out to SNF facilities via the hub at this time.  Pt has been living independently prior to being admitted to [facility].   Employment status:  Retired Forensic scientist:  Managed Care PT Recommendations:  Not assessed at this time Information / Referral to community resources:     Patient/Family's Response to care:  Patient alert and oriented.  Patient agreeable to planto return home only at this time.  Pt's son Merrily Pew and brother Juanda Crumble supportive and strongly involved in pt.'s care.  Pt pleasant and appreciated CSW intervention.   Patient/Family's Understanding of and Emotional Response to Diagnosis, Current Treatment, and Prognosis:  Still assessing  Emotional Assessment Appearance:     Attitude/Demeanor/Rapport:    Affect (typically observed):  Accepting, Adaptable, Calm, Pleasant Orientation:  Oriented to Self, Oriented to Place, Oriented to Situation, Oriented to  Time Alcohol / Substance use:    Psych involvement (Current and /or in the community):     Discharge Needs  Concerns to be addressed:    Readmission within the last 30 days:  Yes Current discharge risk:  None Barriers to Discharge:  No Barriers Identified   Claudine Mouton, LCSWA 04/11/2017, 10:26 PM

## 2017-04-11 NOTE — Progress Notes (Signed)
Pt had a 4.42 second pause followed by a PVC, was SB in 30's for 10 seconds before converting to NS at baseline. Notified Baltazar Najjar, NP on call for [facility]. Asymptomatic, will continue to monitor.

## 2017-04-11 NOTE — Progress Notes (Addendum)
PROGRESS NOTE    Sharon Warner  QQI:[id number] DOB: 07-19-1951 DOA: 04/04/2017 PCP: [name], MD    Brief Narrative:  66 y.o. female with Past medical history of paraplegia, neurogenic bladder, wheelchair-bound, chronic indwelling Foley catheter, sacral decubitus ulcer, morbid obesity, HTN, type II DM, sacral osteomyelitis, chronic diastolic CHF. Patient presented with complains of fever. In her usual health yesterday. This morning when she woke up and the home health nurse came to see her she had a temp of 101.4 and she was asked to go to ER. Denies having any complaints of chills, nausea, vomiting, abdominal pain, chest pain, headache, dizziness. No cough. No recent change in medications. Her Foley catheter is past due for a change for 2 days. She has a colostomy bag which was last emptied on Wednesday. Denies any recent change in medication other than decreasing Lopressor which was done almost a month ago.  ED Course: Presented with fever, was febrile with tachycardia, UA was showing pyuria and patient was started on antibiotics and was referred for admission  Assessment & Plan:   Principal Problem:   Sepsis ([place]) Active Problems:   Morbid obesity (HCC)   HTN (hypertension)   Paraplegia (HCC)   Neurogenic bladder   Obstructive sleep apnea   Protein-calorie malnutrition (HCC)   Hypercalcemia   Chronic diastolic heart failure (HCC)   Sacral decubitus ulcer, stage IV (HCC)   Acute on chronic combined systolic and diastolic CHF (congestive heart failure) ([place])  sepsis present on admission [facility]), bacteremia -Pt presents with fever, leukocytosis, sinus tachycardia and evidence of pyuria the patient is meeting sepsis criteria. uti thought was the cause of sepsis and bacteremia, foley cath changed at time of admission -however urine culture no growth, blood culture +pos for proteus and strep species which is consistent with culture from Last year in June 2017 patient had  polymicrobial bacteremia as well as sacral osteomyelitis and was treated with ceftriaxone. -I have discussed the case with infectious disease Dr Linus Salmons who recommend CT pel and general surgery consult for possible wound debridment --Patient is started  on rocephin since admission, can transition to keflex after wound debridement ( if indicated) per infectious disease  Sinus tachycardia/bradycardia. -Pt presented with sinus tachycardia in the range of 130s and 140s iniitally on admission -Likely sequela of sepsis versus rebound tachycardia after holding beta blocker secondary to hypertension -Tachycardia resolved with resumption of beta blocker, however , patient noted to be transiently profoundly bradycardic/sinus arrest  -keep mag>2, k >4, tsh 2.1, - cardiology consulted, now off betablocker , patient is deemed to be a poor candidate for pacemaker, meds adjustment per cardiology  acute on Chronic combined systolic and diastolic heart failure, acute hypoxic respiratory failure. --Patient had been on torsemide prior to hospital admission. -she received fluids resuscitation for sepsis initially, cxr with findings of developing pulmonary edema -echo with reduced ef 25-30% - cardiology consulted,  Currently off betablocker, arb, on diuretics only. meds adjustment per cardiology. Wean oxygen.  Hypokalemia/hypomagnesemia: replace k/mag prn , keep K.4, mag>2   Hypercalcemia. -ca 11.3 on admission -Etiology remains unclear. -Home vitamin D supplementation on hold -Calcium levels have normalized  Type 2 diabetes mellitus. -On sensitive sliding scale. -Not on diabetic medications prior to admission -Labs reviewed. Currently stable   OSA. -Continue CPAP Daily at bedtime. -Currently stable  Hoarse voice -Unclear etiology -Pt denies coughing, no sore throat, no n/v -Question laryngitis  --soft tissue neck CT no acute findings, rapid strep test negative -improving  Bedbound,  paraplegia,  Chronic stage 4 pressure injury to sacrum presented on admission, s/p colostomy and indwelling foley Wound care consulted  DVT prophylaxis: Lovenox subcutaneous Code Status: Full code Family Communication: Patient in room Disposition Plan: remain in stepdown due to tachy/brady  Consultants:   Cardiology  Infectious disease (phone conversation)  General surgery  Wound care  Procedures:   none  Antimicrobials: Anti-infectives    Start     Dose/Rate Route Frequency Ordered Stop   04/06/17 1400  cefTRIAXone (ROCEPHIN) 2 g in dextrose 5 % 50 mL IVPB     2 g 100 mL/hr over 30 Minutes Intravenous Every 24 hours 04/05/17 1417     04/05/17 1400  cefTRIAXone (ROCEPHIN) 1 g in dextrose 5 % 50 mL IVPB  Status:  Discontinued     1 g 100 mL/hr over 30 Minutes Intravenous Every 24 hours 04/04/17 1235 04/05/17 1417   04/04/17 1230  cefTRIAXone (ROCEPHIN) 2 g in dextrose 5 % 50 mL IVPB     2 g 100 mL/hr over 30 Minutes Intravenous  Once 04/04/17 1220 04/04/17 2213      Subjective:  hoarse voice has much improved, today her voice is better and she is able to talk Has one episode of sinus pause 4s at around 2:30 am ,she report she was watching TV at that time No fever   Objective: Vitals:   04/11/17 1000 04/11/17 1100 04/11/17 1200 04/11/17 1300  BP: (!) 109/54 (!) 96/54 (!) 96/53 105/60  Pulse: 68 76 89 84  Resp: (!) 21 (!) 23 (!) 29 (!) 29  Temp:   98.5 F (36.9 C)   TempSrc:   Oral   SpO2: 100% 100% 100% 100%  Weight:      Height:        Intake/Output Summary (Last 24 hours) at 04/11/17 1512 Last data filed at 04/11/17 5397  Gross per 24 hour  Intake              360 ml  Output              850 ml  Net             -490 ml   Filed Weights   04/09/17 0333 04/10/17 0437 04/11/17 0456  Weight: 77.4 kg (170 lb 10.2 oz) 75.6 kg (166 lb 10.7 oz) 75.4 kg (166 lb 3.6 oz)    Examination: General exam: chronically ill, Awake, laying in bed, in nad, hoarse voice  has improved Respiratory system: decreased breath sounds at bilateral base,  no wheezing, no rales, no rhonchi Cardiovascular system: regular rate, s1, s2 Gastrointestinal system: Soft, nondistended, positive BS, + colostomy bag with hard stool, indwelling foley Central nervous system: chronic paraplegia, no sensation from waist down,  Extremities:+bilateral heal protection  Skin: Chronic stage 4 pressure injury to sacrum presented on admission,  Psychiatry: Mood normal // no visual hallucinations    Data Reviewed: I have personally reviewed following labs and imaging studies  CBC:  Recent Labs Lab 04/05/17 0348 04/06/17 0101 04/07/17 0310 04/08/17 0318 04/09/17 0301  WBC 11.5* 9.5 8.0 10.4 9.5  NEUTROABS 9.0*  --   --   --   --   HGB 9.2* 9.5* 9.1* 9.7* 9.8*  HCT 29.3* 30.3* 29.3* 31.5* 31.5*  MCV 76.9* 76.7* 75.3* 77.0* 77.6*  PLT 281 258 254 285 673   Basic Metabolic Panel:  Recent Labs Lab 04/05/17 0348  04/07/17 0310 04/08/17 0318 04/09/17 0301 04/10/17 0316 04/11/17 0254  NA  134*  < > 135 137 139 142 137  K 3.3*  < > 3.7 3.6 3.5 3.4* 3.5  CL 105  < > 114* 110 105 103 98*  CO2 20*  < > 16* 20* 25 30 31   GLUCOSE 141*  < > 139* 151* 126* 125* 122*  BUN 19  < > 19 20 22* 24* 21*  CREATININE 0.60  < > 0.54 0.52 0.58 0.66 0.53  CALCIUM 9.5  < > 9.5 9.9 10.0 9.8 9.7  MG 1.4*  --   --  1.8  --  2.2  --   < > = values in this interval not displayed. GFR: Estimated Creatinine Clearance: 65.9 mL/min (by C-G formula based on SCr of 0.53 mg/dL). Liver Function Tests:  Recent Labs Lab 04/05/17 0348  AST 22  ALT 14  ALKPHOS 56  BILITOT 0.6  PROT 5.7*  ALBUMIN 2.7*   No results for input(s): LIPASE, AMYLASE in the last 168 hours. No results for input(s): AMMONIA in the last 168 hours. Coagulation Profile: No results for input(s): INR, PROTIME in the last 168 hours. Cardiac Enzymes: No results for input(s): CKTOTAL, CKMB, CKMBINDEX, TROPONINI in the last 168  hours. BNP (last 3 results) No results for input(s): PROBNP in the last 8760 hours. HbA1C: No results for input(s): HGBA1C in the last 72 hours. CBG:  Recent Labs Lab 04/10/17 1617 04/10/17 2108 04/11/17 0805 04/11/17 1159 04/11/17 1507  GLUCAP 133* 101* 96 128* 88   Lipid Profile: No results for input(s): CHOL, HDL, LDLCALC, TRIG, CHOLHDL, LDLDIRECT in the last 72 hours. Thyroid Function Tests: No results for input(s): TSH, T4TOTAL, FREET4, T3FREE, THYROIDAB in the last 72 hours. Anemia Panel: No results for input(s): VITAMINB12, FOLATE, FERRITIN, TIBC, IRON, RETICCTPCT in the last 72 hours. Sepsis Labs:  Recent Labs Lab 04/04/17 1556 04/05/17 0719 04/07/17 0310 04/09/17 0301  PROCALCITON  --  0.61 1.55 1.36  LATICACIDVEN 0.73 0.7  --   --     Recent Results (from the past 240 hour(s))  Culture, blood (Routine x 2)     Status: Abnormal   Collection Time: 04/04/17 12:42 PM  Result Value Ref Range Status   Specimen Description BLOOD BLOOD RIGHT ARM  Final   Special Requests   Final    BOTTLES DRAWN AEROBIC AND ANAEROBIC Blood Culture adequate volume   Culture  Setup Time   Final    GRAM POSITIVE COCCI IN PAIRS IN CHAINS GRAM NEGATIVE RODS AEROBIC BOTTLE ONLY CRITICAL RESULT CALLED TO, READ BACK BY AND VERIFIED WITH: A PHAM,PHARMD AT 1437 04/05/17 BY L BENFIELD Performed at [facility], [name] [address], [place], [place] [address]    Culture (A)  Final    PROTEUS MIRABILIS GROUP B STREP(S.AGALACTIAE)ISOLATED    Report Status 04/09/2017 FINAL  Final   Organism ID, Bacteria PROTEUS MIRABILIS  Final   Organism ID, Bacteria GROUP B STREP(S.AGALACTIAE)ISOLATED  Final      Susceptibility   Group b strep(s.agalactiae)isolated - MIC*    CLINDAMYCIN >=1 RESISTANT Resistant     AMPICILLIN <=0.25 SENSITIVE Sensitive     ERYTHROMYCIN >=8 RESISTANT Resistant     VANCOMYCIN 0.5 SENSITIVE Sensitive     CEFTRIAXONE <=0.12 SENSITIVE Sensitive     LEVOFLOXACIN 1 SENSITIVE  Sensitive     * GROUP B STREP(S.AGALACTIAE)ISOLATED   Proteus mirabilis - MIC*    AMPICILLIN <=2 SENSITIVE Sensitive     CEFAZOLIN 8 SENSITIVE Sensitive     CEFEPIME <=1 SENSITIVE Sensitive  CEFTAZIDIME <=1 SENSITIVE Sensitive     CEFTRIAXONE <=1 SENSITIVE Sensitive     CIPROFLOXACIN 0.5 SENSITIVE Sensitive     GENTAMICIN <=1 SENSITIVE Sensitive     IMIPENEM 4 SENSITIVE Sensitive     TRIMETH/SULFA <=20 SENSITIVE Sensitive     AMPICILLIN/SULBACTAM <=2 SENSITIVE Sensitive     PIP/TAZO <=4 SENSITIVE Sensitive     * PROTEUS MIRABILIS  Blood Culture ID Panel (Reflexed)     Status: Abnormal   Collection Time: 04/04/17 12:42 PM  Result Value Ref Range Status   Enterococcus species NOT DETECTED NOT DETECTED Final   Listeria monocytogenes NOT DETECTED NOT DETECTED Final   Staphylococcus species NOT DETECTED NOT DETECTED Final   Staphylococcus aureus NOT DETECTED NOT DETECTED Final   Streptococcus species DETECTED (A) NOT DETECTED Final    Comment: CRITICAL RESULT CALLED TO, READ BACK BY AND VERIFIED WITH: A PHAM,PHARMD AT 1437 04/05/17 BY L BENFIELD    Streptococcus agalactiae DETECTED (A) NOT DETECTED Final    Comment: CRITICAL RESULT CALLED TO, READ BACK BY AND VERIFIED WITH: A PHAM,PHARMD AT 1437 04/05/17 BY L BENFIELD    Streptococcus pneumoniae NOT DETECTED NOT DETECTED Final   Streptococcus pyogenes NOT DETECTED NOT DETECTED Final   Acinetobacter baumannii NOT DETECTED NOT DETECTED Final   Enterobacteriaceae species DETECTED (A) NOT DETECTED Final    Comment: Enterobacteriaceae represent a large family of gram-negative bacteria, not a single organism. CRITICAL RESULT CALLED TO, READ BACK BY AND VERIFIED WITH: A PHAM,PHARMD AT 1437 04/05/17 BY L BENFIELD    Enterobacter cloacae complex NOT DETECTED NOT DETECTED Final   Escherichia coli NOT DETECTED NOT DETECTED Final   Klebsiella oxytoca NOT DETECTED NOT DETECTED Final   Klebsiella pneumoniae NOT DETECTED NOT DETECTED Final    Proteus species DETECTED (A) NOT DETECTED Final    Comment: CRITICAL RESULT CALLED TO, READ BACK BY AND VERIFIED WITH: A PHAM,PHARMD AT 1437 04/05/17 BY L BENFIELD    Serratia marcescens NOT DETECTED NOT DETECTED Final   Carbapenem resistance NOT DETECTED NOT DETECTED Final   Haemophilus influenzae NOT DETECTED NOT DETECTED Final   Neisseria meningitidis NOT DETECTED NOT DETECTED Final   Pseudomonas aeruginosa NOT DETECTED NOT DETECTED Final   Candida albicans NOT DETECTED NOT DETECTED Final   Candida glabrata NOT DETECTED NOT DETECTED Final   Candida krusei NOT DETECTED NOT DETECTED Final   Candida parapsilosis NOT DETECTED NOT DETECTED Final   Candida tropicalis NOT DETECTED NOT DETECTED Final    Comment: Performed at [facility], [place] [address], [place], [place] [address]  Urine culture     Status: Abnormal   Collection Time: 04/04/17  3:43 PM  Result Value Ref Range Status   Specimen Description URINE, CATHETERIZED  Final   Special Requests NONE  Final   Culture MULTIPLE ORGANISMS PRESENT, NONE PREDOMINANT (A)  Final   Report Status 04/05/2017 FINAL  Final  Culture, blood (Routine x 2)     Status: None   Collection Time: 04/04/17  6:54 PM  Result Value Ref Range Status   Specimen Description BLOOD LEFT ANTECUBITAL  Final   Special Requests IN PEDIATRIC BOTTLE Blood Culture adequate volume  Final   Culture   Final    NO GROWTH 5 DAYS Performed at [facility], [place] [address], [place], [place] [address]    Report Status 04/09/2017 FINAL  Final  MRSA PCR Screening     Status: None   Collection Time: 04/04/17  6:58 PM  Result  Value Ref Range Status   MRSA by PCR NEGATIVE NEGATIVE Final    Comment:        The GeneXpert MRSA Assay (FDA approved for NASAL specimens only), is one component of a comprehensive MRSA colonization surveillance program. It is not intended to diagnose MRSA infection nor to guide or monitor treatment for MRSA infections.   Rapid strep  screen (not at [facility])     Status: None   Collection Time: 04/09/17  9:18 AM  Result Value Ref Range Status   Streptococcus, Group A Screen (Direct) NEGATIVE NEGATIVE Final    Comment: (NOTE) A Rapid Antigen test may result negative if the antigen level in the sample is below the detection level of this test. The FDA has not cleared this test as a stand-alone test therefore the rapid antigen negative result has reflexed to a Group A Strep culture.   Culture, group A strep     Status: None   Collection Time: 04/09/17  9:18 AM  Result Value Ref Range Status   Specimen Description THROAT  Final   Special Requests NONE Reflexed from [id number]  Final   Culture   Final    NO GROUP A STREP (S.PYOGENES) ISOLATED Performed at [facility], [address], [place], [place] [address]    Report Status 04/11/2017 FINAL  Final  Culture, blood (routine x 2)     Status: None (Preliminary result)   Collection Time: 04/09/17  9:46 AM  Result Value Ref Range Status   Specimen Description BLOOD LEFT ARM  Final   Special Requests IN PEDIATRIC BOTTLE Blood Culture adequate volume  Final   Culture   Final    NO GROWTH 2 DAYS Performed at [facility], [place] [address], [place], [place] [address]    Report Status PENDING  Incomplete  Culture, blood (routine x 2)     Status: None (Preliminary result)   Collection Time: 04/09/17  9:46 AM  Result Value Ref Range Status   Specimen Description BLOOD RIGHT ARM  Final   Special Requests IN PEDIATRIC BOTTLE Blood Culture adequate volume  Final   Culture  Setup Time   Final    GRAM POSITIVE COCCI IN CLUSTERS IN PEDIATRIC BOTTLE CRITICAL RESULT CALLED TO, READ BACK BY AND VERIFIED WITH: A. Pham Pharm.D. 12:10 04/10/17 ([email or web address]) Performed at [facility], [place] [address], [place], [place] [address]    Culture GRAM POSITIVE COCCI  Final   Report Status PENDING  Incomplete  Blood Culture ID Panel (Reflexed)     Status: Abnormal   Collection Time:  04/09/17  9:46 AM  Result Value Ref Range Status   Enterococcus species NOT DETECTED NOT DETECTED Final   Listeria monocytogenes NOT DETECTED NOT DETECTED Final   Staphylococcus species DETECTED (A) NOT DETECTED Final    Comment: Methicillin (oxacillin) resistant coagulase negative staphylococcus. Possible blood culture contaminant (unless isolated from more than one blood culture draw or clinical case suggests pathogenicity). No antibiotic treatment is indicated for blood  culture contaminants. CRITICAL RESULT CALLED TO, READ BACK BY AND VERIFIED WITH: A. Pham Pharm.D. 12:10 04/10/17 ([email or web address])    Staphylococcus aureus NOT DETECTED NOT DETECTED Final   Methicillin resistance DETECTED (A) NOT DETECTED Final    Comment: CRITICAL RESULT CALLED TO, READ BACK BY AND VERIFIED WITH: A. Pham Pharm.D. 12:10 04/10/17 ([email or web address])    Streptococcus species NOT DETECTED NOT DETECTED Final   Streptococcus agalactiae NOT DETECTED NOT DETECTED Final   Streptococcus pneumoniae  NOT DETECTED NOT DETECTED Final   Streptococcus pyogenes NOT DETECTED NOT DETECTED Final   Acinetobacter baumannii NOT DETECTED NOT DETECTED Final   Enterobacteriaceae species NOT DETECTED NOT DETECTED Final   Enterobacter cloacae complex NOT DETECTED NOT DETECTED Final   Escherichia coli NOT DETECTED NOT DETECTED Final   Klebsiella oxytoca NOT DETECTED NOT DETECTED Final   Klebsiella pneumoniae NOT DETECTED NOT DETECTED Final   Proteus species NOT DETECTED NOT DETECTED Final   Serratia marcescens NOT DETECTED NOT DETECTED Final   Haemophilus influenzae NOT DETECTED NOT DETECTED Final   Neisseria meningitidis NOT DETECTED NOT DETECTED Final   Pseudomonas aeruginosa NOT DETECTED NOT DETECTED Final   Candida albicans NOT DETECTED NOT DETECTED Final   Candida glabrata NOT DETECTED NOT DETECTED Final   Candida krusei NOT DETECTED NOT DETECTED Final   Candida parapsilosis NOT DETECTED NOT DETECTED Final   Candida tropicalis NOT DETECTED  NOT DETECTED Final    Comment: Performed at [facility], [place] [address] [place], [place] [address]     Radiology Studies: No results found.  Scheduled Meds: . acidophilus  2 capsule Oral TID  . aspirin EC  81 mg Oral Daily  . enoxaparin (LOVENOX) injection  40 mg Subcutaneous Q24H  . feeding supplement (ENSURE ENLIVE)  237 mL Oral BID BM  . fluticasone  2 spray Each Nare Daily  . insulin aspart  0-15 Units Subcutaneous TID WC  . insulin aspart  0-5 Units Subcutaneous QHS  . mouth rinse  15 mL Mouth Rinse BID  . multivitamin with minerals  1 tablet Oral Daily  . potassium chloride  10 mEq Oral Daily  . senna-docusate  1 tablet Oral BID  . sodium chloride flush  3 mL Intravenous Q12H  . torsemide  10 mg Oral Daily   Continuous Infusions: . cefTRIAXone (ROCEPHIN)  IV 2 g (04/11/17 1344)     LOS: 7 days    Time spent> 47mins  Demarrius Guerrero, MD PhD [facility] Pager [phone]  If 7PM-7AM, please contact night-coverage [email or web address] Password [email or web address] 04/11/2017, 3:12 PM

## 2017-04-11 NOTE — Progress Notes (Signed)
Progress Note  Patient Name: Sharon Warner Date of Encounter: 04/11/2017  Primary Cardiologist: New  Subjective   66 yo with hx of paraplegia, HTN Hs a long hx of CHF ( originally diagnosed at an outside hospital )   Went into acute CHF when she was treated with aggressive IV fluids for sepsis   Had an episode of 1 pause at 2:30 am, 4 seconds.  Inpatient Medications    Scheduled Meds: . acidophilus  2 capsule Oral TID  . aspirin EC  81 mg Oral Daily  . enoxaparin (LOVENOX) injection  40 mg Subcutaneous Q24H  . feeding supplement (ENSURE ENLIVE)  237 mL Oral BID BM  . fluticasone  2 spray Each Nare Daily  . insulin aspart  0-15 Units Subcutaneous TID WC  . insulin aspart  0-5 Units Subcutaneous QHS  . mouth rinse  15 mL Mouth Rinse BID  . metoprolol succinate  12.5 mg Oral Daily  . multivitamin with minerals  1 tablet Oral Daily  . potassium chloride  10 mEq Oral Daily  . potassium chloride  40 mEq Oral Once  . senna-docusate  1 tablet Oral BID  . sodium chloride flush  3 mL Intravenous Q12H  . torsemide  10 mg Oral Daily   Continuous Infusions: . cefTRIAXone (ROCEPHIN)  IV Stopped (04/10/17 1503)   PRN Meds: acetaminophen **OR** acetaminophen, iopamidol, labetalol, naproxen sodium, ondansetron **OR** ondansetron (ZOFRAN) IV, traMADol-acetaminophen   Vital Signs    Vitals:   04/11/17 0456 04/11/17 0500 04/11/17 0600 04/11/17 0800  BP:  (!) 114/55 125/61   Pulse:  77 66   Resp:  (!) 24 20   Temp:    98.6 F (37 C)  TempSrc:    Oral  SpO2:  100% 100%   Weight: 166 lb 3.6 oz (75.4 kg)     Height:        Intake/Output Summary (Last 24 hours) at 04/11/17 0829 Last data filed at 04/11/17 0400  Gross per 24 hour  Intake               50 ml  Output             1750 ml  Net            -1700 ml   Filed Weights   04/09/17 0333 04/10/17 0437 04/11/17 0456  Weight: 170 lb 10.2 oz (77.4 kg) 166 lb 10.7 oz (75.6 kg) 166 lb 3.6 oz (75.4 kg)    Telemetry      Normal sinus rhythm, rate controlled. One episode of 4 second pause.- Personally Reviewed   Physical Exam   General: Well developed, well nourished, in no acute distress. Head: Normocephalic, atraumatic, Neck:Negative for carotid bruits. JVD not elevated. Hoarse Lungs:Crackles to lung basis, normal effort of breathing Heart:RRR Abdomen: Soft, non-tender, + colostomy and indwelling foley. Neuro:Alert and oriented X 3. Paraplegic to LE. + le edema.  Labs    Chemistry Recent Labs Lab 04/04/17 1242  04/05/17 0348  04/09/17 0301 04/10/17 0316 04/11/17 0254  NA 135  --  134*  < > 139 142 137  K 4.3  --  3.3*  < > 3.5 3.4* 3.5  CL 101  --  105  < > 105 103 98*  CO2 25  --  20*  < > 25 30 31   GLUCOSE 145*  --  141*  < > 126* 125* 122*  BUN 21*  --  19  < > 22* 24* 21*  CREATININE 0.70  --  0.60  < > 0.58 0.66 0.53  CALCIUM 10.8*  < > 9.5  < > 10.0 9.8 9.7  PROT 7.2  --  5.7*  --   --   --   --   ALBUMIN 3.4*  --  2.7*  --   --   --   --   AST 34  --  22  --   --   --   --   ALT 12*  --  14  --   --   --   --   ALKPHOS 68  --  56  --   --   --   --   BILITOT 1.4*  --  0.6  --   --   --   --   GFRNONAA >60  --  >60  < > >60 >60 >60  GFRAA >60  --  >60  < > >60 >60 >60  ANIONGAP 9  --  9  < > 9 9 8   < > = values in this interval not displayed.   Hematology Recent Labs Lab 04/07/17 0310 04/08/17 0318 04/09/17 0301  WBC 8.0 10.4 9.5  RBC 3.89 4.09 4.06  HGB 9.1* 9.7* 9.8*  HCT 29.3* 31.5* 31.5*  MCV 75.3* 77.0* 77.6*  MCH 23.4* 23.7* 24.1*  MCHC 31.1 30.8 31.1  RDW 16.4* 16.6* 16.9*  PLT 254 285 311     Radiology    No results found.  Cardiac Studies   Echocardiogram 04/09/2017  Study Conclusions  - Left ventricle: Septal and apical akinesis. Inferior and distal anterior wall hypokinesis. The cavity size was severely dilated. Wall thickness was increased in a pattern of moderate LVH. Systolic function was severely reduced. The estimated  ejection fraction was in the range of 25% to 30%. - Aortic valve: There was trivial regurgitation. - Left atrium: The atrium was mildly dilated. - Atrial septum: No defect or patent foramen ovale was identified. - Pulmonary arteries: PA peak pressure: 33 mm Hg (S). - Pericardium, extracardiac: Small but nearly circumferential pericardial effusion no tamponade IVC small.  Patient Profile     Sharon Warner a 66 y.o.femalewith a hx of paraplegia, neurogenic bladder, wheelchair bound, chronic indwelling foley catheter, sacral decubitus ulcer, morbid obesity, HTN. Type II diabetes, sacral osteomyelitis, colostomy bag, chronic diastolic CHF. We are seeing patient regarding bradycardia and CHF.  Assessment & Plan     1. Acute on chronic systolic CHF: has diuresed well,  I would recomm another IV 40 mg lasix as volume is still up on exam Stop b blocker given pause  Follow Watch BP  I would not add ARB now.    2. Bradycardia: Patient had another episode of a 4.4 sec pause at 2:50 AM  Not too concerning but would stop b blicker and follow (d/c prn labetalol as well)    Signed, GREENE,TIFFANY G, PA-C  04/11/2017, 8:29 AM    Pt seen andexamined .  I have amended note above by Burr Medico On exam:  Neck Full  Lungs rel clear  Cardiac exam:  RRR  No S3  Ext with 1+ edema  Hold b blocker  Follow HR  Not too concerning as occurred while sleeping  Give additional IV lasix  Watch I/O and Cr   Keep on Torsemide. Will follow  [name]

## 2017-04-12 ENCOUNTER — Inpatient Hospital Stay (HOSPITAL_COMMUNITY): Payer: BLUE CROSS/BLUE SHIELD

## 2017-04-12 DIAGNOSIS — R7881 Bacteremia: Secondary | ICD-10-CM

## 2017-04-12 DIAGNOSIS — M461 Sacroiliitis, not elsewhere classified: Secondary | ICD-10-CM

## 2017-04-12 DIAGNOSIS — R001 Bradycardia, unspecified: Secondary | ICD-10-CM

## 2017-04-12 DIAGNOSIS — I455 Other specified heart block: Secondary | ICD-10-CM

## 2017-04-12 DIAGNOSIS — B955 Unspecified streptococcus as the cause of diseases classified elsewhere: Secondary | ICD-10-CM

## 2017-04-12 DIAGNOSIS — L89154 Pressure ulcer of sacral region, stage 4: Secondary | ICD-10-CM

## 2017-04-12 LAB — CBC WITH DIFFERENTIAL/PLATELET
Basophils Absolute: 0 10*3/uL (ref 0.0–0.1)
Basophils Relative: 0 %
EOS ABS: 0.1 10*3/uL (ref 0.0–0.7)
EOS PCT: 1 %
HCT: 36.4 % (ref 36.0–46.0)
Hemoglobin: 11.1 g/dL — ABNORMAL LOW (ref 12.0–15.0)
LYMPHS ABS: 3.6 10*3/uL (ref 0.7–4.0)
LYMPHS PCT: 24 %
MCH: 23.7 pg — AB (ref 26.0–34.0)
MCHC: 30.5 g/dL (ref 30.0–36.0)
MCV: 77.6 fL — AB (ref 78.0–100.0)
MONO ABS: 1.1 10*3/uL — AB (ref 0.1–1.0)
MONOS PCT: 7 %
Neutro Abs: 10.5 10*3/uL — ABNORMAL HIGH (ref 1.7–7.7)
Neutrophils Relative %: 68 %
PLATELETS: 456 10*3/uL — AB (ref 150–400)
RBC: 4.69 MIL/uL (ref 3.87–5.11)
RDW: 16.8 % — ABNORMAL HIGH (ref 11.5–15.5)
WBC: 15.3 10*3/uL — ABNORMAL HIGH (ref 4.0–10.5)

## 2017-04-12 LAB — GLUCOSE, CAPILLARY
GLUCOSE-CAPILLARY: 110 mg/dL — AB (ref 65–99)
GLUCOSE-CAPILLARY: 143 mg/dL — AB (ref 65–99)
GLUCOSE-CAPILLARY: 129 mg/dL — AB (ref 65–99)
GLUCOSE-CAPILLARY: 137 mg/dL — AB (ref 65–99)
GLUCOSE-CAPILLARY: 160 mg/dL — AB (ref 65–99)
Glucose-Capillary: 214 mg/dL — ABNORMAL HIGH (ref 65–99)

## 2017-04-12 LAB — BASIC METABOLIC PANEL
Anion gap: 12 (ref 5–15)
BUN: 22 mg/dL — AB (ref 6–20)
CHLORIDE: 93 mmol/L — AB (ref 101–111)
CO2: 31 mmol/L (ref 22–32)
CREATININE: 0.56 mg/dL (ref 0.44–1.00)
Calcium: 9.8 mg/dL (ref 8.9–10.3)
GFR calc Af Amer: >60 mL/min (ref >60)
GFR calc non Af Amer: >60 mL/min (ref >60)
GLUCOSE: 133 mg/dL — AB (ref 65–99)
POTASSIUM: 4.3 mmol/L (ref 3.5–5.1)
SODIUM: 136 mmol/L (ref 135–145)

## 2017-04-12 LAB — HEMOGLOBIN A1C
HEMOGLOBIN A1C: 5.1 % (ref 4.8–5.6)
MEAN PLASMA GLUCOSE: 100 mg/dL

## 2017-04-12 LAB — MAGNESIUM: MAGNESIUM: 2.1 mg/dL (ref 1.7–2.4)

## 2017-04-12 LAB — BRAIN NATRIURETIC PEPTIDE: B Natriuretic Peptide: 1317.3 pg/mL — ABNORMAL HIGH (ref 0.0–100.0)

## 2017-04-12 MED ORDER — METRONIDAZOLE 500 MG PO TABS
500.0000 mg | ORAL_TABLET | Freq: Three times a day (TID) | ORAL | Status: DC
  Administered 2017-04-12 – 2017-04-16 (×12): 500 mg via ORAL
  Filled 2017-04-12 (×12): qty 1

## 2017-04-12 MED ORDER — CEPHALEXIN 500 MG PO CAPS
500.0000 mg | ORAL_CAPSULE | Freq: Three times a day (TID) | ORAL | Status: DC
  Administered 2017-04-12 – 2017-04-16 (×12): 500 mg via ORAL
  Filled 2017-04-12 (×13): qty 1

## 2017-04-12 MED ORDER — METOPROLOL TARTRATE 5 MG/5ML IV SOLN
2.5000 mg | INTRAVENOUS | Status: DC
  Filled 2017-04-12: qty 5

## 2017-04-12 MED ORDER — SPIRONOLACTONE 25 MG PO TABS
25.0000 mg | ORAL_TABLET | Freq: Every day | ORAL | Status: DC
  Administered 2017-04-12: 25 mg via ORAL
  Filled 2017-04-12: qty 1

## 2017-04-12 MED ORDER — METOPROLOL TARTRATE 5 MG/5ML IV SOLN
2.5000 mg | INTRAVENOUS | Status: DC | PRN
  Administered 2017-04-13: 2.5 mg via INTRAVENOUS
  Filled 2017-04-12: qty 5

## 2017-04-12 MED ORDER — DAKINS (1/4 STRENGTH) 0.125 % EX SOLN
Freq: Two times a day (BID) | CUTANEOUS | Status: AC
  Administered 2017-04-12 (×2)
  Administered 2017-04-13: 1
  Administered 2017-04-13: 10:00:00
  Filled 2017-04-12: qty 473

## 2017-04-12 MED ORDER — POLYETHYLENE GLYCOL 3350 17 G PO PACK
17.0000 g | PACK | Freq: Every day | ORAL | Status: DC
  Administered 2017-04-12 – 2017-04-13 (×2): 17 g via ORAL
  Filled 2017-04-12 (×2): qty 1

## 2017-04-12 NOTE — Progress Notes (Signed)
Pt. currently on n/c has been refusing CPAP, RT to monitor.

## 2017-04-12 NOTE — Progress Notes (Addendum)
Progress Note  Patient Name: Bobbie Virden Date of Encounter: 04/12/2017  Primary Cardiologist: new ([facility])  Subjective   Feeling well.  Denies chest pain or shortness of breath.   Inpatient Medications    Scheduled Meds: . acidophilus  2 capsule Oral TID  . aspirin EC  81 mg Oral Daily  . enoxaparin (LOVENOX) injection  40 mg Subcutaneous Q24H  . feeding supplement (ENSURE ENLIVE)  237 mL Oral BID BM  . fluticasone  2 spray Each Nare Daily  . insulin aspart  0-15 Units Subcutaneous TID WC  . insulin aspart  0-5 Units Subcutaneous QHS  . mouth rinse  15 mL Mouth Rinse BID  . multivitamin with minerals  1 tablet Oral Daily  . potassium chloride  10 mEq Oral Daily  . senna-docusate  1 tablet Oral BID  . sodium chloride flush  3 mL Intravenous Q12H  . torsemide  10 mg Oral Daily   Continuous Infusions: . cefTRIAXone (ROCEPHIN)  IV Stopped (04/11/17 1414)   PRN Meds: acetaminophen **OR** acetaminophen, iopamidol, naproxen sodium, ondansetron **OR** ondansetron (ZOFRAN) IV, traMADol-acetaminophen   Vital Signs    Vitals:   04/12/17 0343 04/12/17 0400 04/12/17 0600 04/12/17 0730  BP:  126/65 (!) 107/48   Pulse:  (!) 115 (!) 111   Resp:  (!) 22 (!) 28   Temp:  98.3 F (36.8 C)  99.4 F (37.4 C)  TempSrc:  Oral  Oral  SpO2:  97% 99%   Weight: 75.2 kg (165 lb 12.6 oz)     Height:        Intake/Output Summary (Last 24 hours) at 04/12/17 0801 Last data filed at 04/12/17 0600  Gross per 24 hour  Intake             1310 ml  Output             4700 ml  Net            -3390 ml   Filed Weights   04/10/17 0437 04/11/17 0456 04/12/17 0343  Weight: 75.6 kg (166 lb 10.7 oz) 75.4 kg (166 lb 3.6 oz) 75.2 kg (165 lb 12.6 oz)    Telemetry    Sinus rhythm.  No events.  - Personally Reviewed  ECG    n/a - Personally Reviewed  Physical Exam   GEN: Chronically ill-appearing.  No acute distress.   HEENT: Erythematous face Neck: No JVD Cardiac: RRR, no murmurs, rubs,  or gallops.  Respiratory: Clear to auscultation bilaterally. GI: Soft, nontender, non-distended  MS: No edema; No deformity. Neuro:  Nonfocal  Psych: Normal affect   Labs    Chemistry Recent Labs Lab 04/10/17 0316 04/11/17 0254 04/12/17 0334  NA 142 137 136  K 3.4* 3.5 4.3  CL 103 98* 93*  CO2 30 31 31   GLUCOSE 125* 122* 133*  BUN 24* 21* 22*  CREATININE 0.66 0.53 0.56  CALCIUM 9.8 9.7 9.8  GFRNONAA >60 >60 >60  GFRAA >60 >60 >60  ANIONGAP 9 8 12      Hematology Recent Labs Lab 04/08/17 0318 04/09/17 0301 04/12/17 0334  WBC 10.4 9.5 15.3*  RBC 4.09 4.06 4.69  HGB 9.7* 9.8* 11.1*  HCT 31.5* 31.5* 36.4  MCV 77.0* 77.6* 77.6*  MCH 23.7* 24.1* 23.7*  MCHC 30.8 31.1 30.5  RDW 16.6* 16.9* 16.8*  PLT 285 311 456*    Cardiac EnzymesNo results for input(s): TROPONINI in the last 168 hours. No results for input(s): TROPIPOC in the last  168 hours.   BNPNo results for input(s): BNP, PROBNP in the last 168 hours.   DDimer No results for input(s): DDIMER in the last 168 hours.   Radiology    Ct Pelvis W Contrast  Addendum Date: 04/11/2017   ADDENDUM REPORT: 04/11/2017 19:47 ADDENDUM: Additional finding to musculoskeletal section: Bilateral intramedullary femoral rods across old left distal shaft fracture. There is somewhat posterior peripheral positioning of the right femoral rod. There is a detached fixating screw within the medial, posterior tissues of the upper thigh/gluteal region. Electronically Signed   By: Donavan Foil M.D.   On: 04/11/2017 19:47   Addendum Date: 04/11/2017   ADDENDUM REPORT: 04/11/2017 19:28 ADDENDUM: Left external iliac and inguinal adenopathy likely reactive Electronically Signed   By: Donavan Foil M.D.   On: 04/11/2017 19:28   Result Date: 04/11/2017 CLINICAL DATA:  Sacral decubitus ulcer, fever EXAM: CT PELVIS WITH CONTRAST TECHNIQUE: Multidetector CT imaging of the pelvis was performed using the standard protocol following the bolus  administration of intravenous contrast. CONTRAST:  145mL ISOVUE-300 IOPAMIDOL (ISOVUE-300) INJECTION 61% COMPARISON:  03/15/2016, 03/13/2016 FINDINGS: Urinary Tract: Foley catheter in the bladder with small air present in the bladder. Very thick-walled appearance of the bladder. Low lying left kidney containing multiple stones, measuring up to 6 mm in the lower pole. No obvious hydronephrosis. Bowel: Mild rectum thickening. Left lower quadrant colostomy with large amount of stool. Vascular/Lymphatic: Aortic atherosclerosis. subcentimeter periaortic lymph nodes. Subcentimeter pelvic sidewall lymph nodes. Enlarged left external iliac nodes measuring up to 2.1 cm. Multiple enlarged left inguinal lymph nodes measuring up to 2.2 cm in short axis. Reproductive:  Status post hysterectomy.  No adnexal masses. Other: Diffuse subcutaneous edema. Mild very rectal soft tissue stranding and small amount of fluid in the pelvis. Musculoskeletal: Large, deep sacral decubitus ulcer, measuring at least 6.7 cm transverse. Underlying bony sclerosis and erosive changes consistent with chronic osteomyelitis. Soft tissue thickening around the left posterior SI joint with bony erosive changes present. Fluid and gas collection anterior to the left SI joint and abutting or involving the left iliacus muscle, consistent with abscess. This measures 2.5 by 2.1 by 4 cm. IMPRESSION: 1. Deep sacral decubitus ulcer down to the bone with underlying sclerosis and extensive erosive changes, consistent with osteomyelitis. More acute appearing soft tissue and erosive change involving the left posterior SI joint. There is gas and mildly rim enhancing fluid collection anterior to the left SI joint which appears contiguous with the joint space, this measures 4 cm cranial caudad by 2.1 cm transverse. The collective findings are concerning for left septic arthritis of the sacroiliac joint with associated abscess anterior to the joint. 2. Low lying left kidney  with multiple stones. Thick-walled urinary bladder with Foley catheter could be due to cystitis 3. Mild rectal wall thickening, query proctitis. Electronically Signed: By: Donavan Foil M.D. On: 04/11/2017 19:00    Cardiac Studies   Echo 04/09/17: Study Conclusions  - Left ventricle: Septal and apical akinesis. Inferior and distal   anterior wall hypokinesis. The cavity size was severely dilated.   Wall thickness was increased in a pattern of moderate LVH.   Systolic function was severely reduced. The estimated ejection   fraction was in the range of 25% to 30%. - Aortic valve: There was trivial regurgitation. - Left atrium: The atrium was mildly dilated. - Atrial septum: No defect or patent foramen ovale was identified. - Pulmonary arteries: PA peak pressure: 33 mm Hg (S). - Pericardium, extracardiac: Small but  nearly circumferential   pericardial effusion no tamponade IVC small.   Patient Profile     66 y.o. female with chronic systolic and diastolic heart failure, hypertension, paraplegia, neurogenic bladder, sacral decubits ulcer, morbid obesity, and colostomy bag here with sepsis who developed acute on chronic heart failure and bradycardia.   Assessment & Plan    # Acute on chronic systolic and diastolic heart failure:  Ms. Ramer reports a long-standing history of heart failure and knew that her LVEF was low prior to admission.  She didn't have regular cardiology follow up.  She received IV fluid resuscitation for sepsis and then developed pulmonary edema.  Weight has decreased from 75.4 to 75.2 kg today.  However it is recorded that she was net negative 3.3L.  Unclear which is correct.  Renal function remains stable with diuresis.  She received one dose of IV lasix 7/13 in addition to torsemide 10mg  daily.  She appears euvolemic but remains tachycardic and tachypneic.  We will check a CXR and BNP.  Add spironolactone 25mg  daily and continue torsemide 10 mg daily.  Will add ARB as BP  tolerates.   No scheduled beta blocker 2/2 bradycardia.  Consider ischemia evaluation as an outpatient after recovery.  # Bradycardia:  Ms. Devilla initially had a 14 second pause on 7/10 while swallowing pills.  Shew was noted to be bradycardic with heart rates in the 30s-40s.  Metoprolol was stopped and she was switched to carvedilol, but continued to have pauses.  She is now tachycardic in the 110s.  It is unclear if this is related to overdiuresis or sepsis.  We will add metoprolol 2.5mg  IV prn for HR>120.   Signed, Skeet Latch, MD  04/12/2017, 8:01 AM

## 2017-04-12 NOTE — Progress Notes (Addendum)
PROGRESS NOTE    Sharon Warner  WLN:[id number] DOB: 10/18/1950 DOA: 04/04/2017 PCP: [name], MD    Brief Narrative:  66 y.o. female with Past medical history of paraplegia, neurogenic bladder, wheelchair-bound, chronic indwelling Foley catheter, sacral decubitus ulcer, morbid obesity, HTN, type II DM, sacral osteomyelitis, chronic diastolic CHF. Patient presented with complains of fever. In her usual health yesterday. This morning when she woke up and the home health nurse came to see her she had a temp of 101.4 and she was asked to go to ER. Denies having any complaints of chills, nausea, vomiting, abdominal pain, chest pain, headache, dizziness. No cough. No recent change in medications. Her Foley catheter is past due for a change for 2 days. She has a colostomy bag which was last emptied on Wednesday. Denies any recent change in medication other than decreasing Lopressor which was done almost a month ago.  ED Course: Presented with fever, was febrile with tachycardia, UA was showing pyuria and patient was started on antibiotics and was referred for admission  Assessment & Plan:   Principal Problem:   Sepsis ([place]) Active Problems:   Morbid obesity (HCC)   HTN (hypertension)   Paraplegia (HCC)   Neurogenic bladder   Obstructive sleep apnea   Protein-calorie malnutrition (HCC)   Hypercalcemia   Chronic diastolic heart failure (HCC)   Sacral decubitus ulcer, stage IV (HCC)   Acute on chronic combined systolic and diastolic CHF (congestive heart failure) ([place])  sepsis present on admission [facility]), bacteremia -Pt presents with fever, leukocytosis, sinus tachycardia and evidence of pyuria the patient is meeting sepsis criteria. uti thought was the cause of sepsis and bacteremia, foley cath changed at time of admission -however urine culture no growth, blood culture +pos for proteus and strep species which is consistent with culture from Last year in June 2017 patient had  polymicrobial bacteremia as well as sacral osteomyelitis and was treated with ceftriaxone.Patient is started  on rocephin since admission,  -I have discussed the case with infectious disease Dr Linus Salmons on 7/13 who recommend CT pel and general surgery consult for possible wound debridment -Ct pel with possible left si joint septic arthritis with fluids collection 4cmx2cm anterior to the joint, I have discussed with oncall orthopedic surgery Dr Raphael Gibney on 7/14 who recommend IR and formal ID consult -I discussed with case with IR Dr Annamaria Boots who do not feel aspiration will change medical management, he recommend infectious disease consult. -infectious disease Dr Megan Salon and general surgery consulted, will follow recommendations  Sinus tachycardia/bradycardia. -Pt presented with sinus tachycardia in the range of 130s and 140s iniitally on admission -Likely sequela of sepsis versus rebound tachycardia after holding beta blocker secondary to hypertension -Tachycardia resolved with resumption of beta blocker, however , patient noted to be transiently profoundly bradycardic/sinus arrest  -keep mag>2, k >4, tsh 2.1, - cardiology consulted, now off betablocker , patient is deemed to be a poor candidate for pacemaker, meds adjustment per cardiology  acute on Chronic combined systolic and diastolic heart failure, acute hypoxic respiratory failure. --Patient had been on torsemide prior to hospital admission. -she received fluids resuscitation for sepsis initially, cxr with findings of developing pulmonary edema -echo with reduced ef 25-30% - cardiology consulted,  Currently off betablocker, arb, on diuretics only. meds adjustment per cardiology. Wean oxygen. -case discussed with cardiology Dr Oval Linsey   Hypokalemia/hypomagnesemia: replace k/mag prn , keep K.4, mag>2   Hypercalcemia. -ca 11.3 on admission -Etiology remains unclear. -Home vitamin D supplementation on hold -Calcium  levels have  normalized  Type 2 diabetes mellitus. -On sensitive sliding scale. -Not on diabetic medications prior to admission -Labs reviewed. Currently stable   OSA. -Continue CPAP Daily at bedtime. -Currently stable  Hoarse voice -Unclear etiology -Pt denies coughing, no sore throat, no n/v -Question laryngitis  --soft tissue neck CT no acute findings, rapid strep test negative -improving  Bedbound,  paraplegia,  Chronic stage 4 pressure injury to sacrum presented on admission, s/p colostomy and indwelling foley Wound care consulted  DVT prophylaxis: Lovenox subcutaneous Code Status: Full code Family Communication: Patient in room, husband over the phone Disposition Plan: remain in stepdown due to tachy/brady  Consultants:   Cardiology  Infectious disease   General surgery  Interventional radiology Dr Annamaria Boots over the phone on 7/14  Orthopedics surgery Dr Raphael Gibney over the phone on 7/14  Wound care  Procedures:   none  Antimicrobials: Anti-infectives    Start     Dose/Rate Route Frequency Ordered Stop   04/06/17 1400  cefTRIAXone (ROCEPHIN) 2 g in dextrose 5 % 50 mL IVPB     2 g 100 mL/hr over 30 Minutes Intravenous Every 24 hours 04/05/17 1417     04/05/17 1400  cefTRIAXone (ROCEPHIN) 1 g in dextrose 5 % 50 mL IVPB  Status:  Discontinued     1 g 100 mL/hr over 30 Minutes Intravenous Every 24 hours 04/04/17 1235 04/05/17 1417   04/04/17 1230  cefTRIAXone (ROCEPHIN) 2 g in dextrose 5 % 50 mL IVPB     2 g 100 mL/hr over 30 Minutes Intravenous  Once 04/04/17 1220 04/04/17 2213      Subjective:   urine output 4.7liter last 24hrs No fever, tmax 99.4 Intermittent mild sinus tachycardia, no bradycardia last 24hrs Report hard stool in colostomy bag   Objective: Vitals:   04/12/17 0343 04/12/17 0400 04/12/17 0600 04/12/17 0730  BP:  126/65 (!) 107/48   Pulse:  (!) 115 (!) 111   Resp:  (!) 22 (!) 28   Temp:  98.3 F (36.8 C)  99.4 F (37.4 C)  TempSrc:  Oral   Oral  SpO2:  97% 99%   Weight: 75.2 kg (165 lb 12.6 oz)     Height:        Intake/Output Summary (Last 24 hours) at 04/12/17 0800 Last data filed at 04/12/17 0600  Gross per 24 hour  Intake             1310 ml  Output             4700 ml  Net            -3390 ml   Filed Weights   04/10/17 0437 04/11/17 0456 04/12/17 0343  Weight: 75.6 kg (166 lb 10.7 oz) 75.4 kg (166 lb 3.6 oz) 75.2 kg (165 lb 12.6 oz)    Examination: General exam: chronically ill, Awake, laying in bed, in nad, hoarse voice has improved Respiratory system: decreased breath sounds at bilateral base,  no wheezing, no rales, no rhonchi Cardiovascular system: regular rate, s1, s2 Gastrointestinal system: Soft, nondistended, positive BS, + colostomy bag with hard stool, indwelling foley Central nervous system: chronic paraplegia, no sensation from waist down,  Extremities:+bilateral heal protection , no edema Skin: Chronic stage 4 pressure injury to sacrum presented on admission,  Psychiatry: Mood normal // no visual hallucinations    Data Reviewed: I have personally reviewed following labs and imaging studies  CBC:  Recent Labs Lab 04/06/17 0101 04/07/17 0310 04/08/17  3875 04/09/17 0301 04/12/17 0334  WBC 9.5 8.0 10.4 9.5 15.3*  NEUTROABS  --   --   --   --  10.5*  HGB 9.5* 9.1* 9.7* 9.8* 11.1*  HCT 30.3* 29.3* 31.5* 31.5* 36.4  MCV 76.7* 75.3* 77.0* 77.6* 77.6*  PLT 258 254 285 311 643*   Basic Metabolic Panel:  Recent Labs Lab 04/08/17 0318 04/09/17 0301 04/10/17 0316 04/11/17 0254 04/12/17 0334  NA 137 139 142 137 136  K 3.6 3.5 3.4* 3.5 4.3  CL 110 105 103 98* 93*  CO2 20* 25 30 31 31   GLUCOSE 151* 126* 125* 122* 133*  BUN 20 22* 24* 21* 22*  CREATININE 0.52 0.58 0.66 0.53 0.56  CALCIUM 9.9 10.0 9.8 9.7 9.8  MG 1.8  --  2.2  --  2.1   GFR: Estimated Creatinine Clearance: 65.9 mL/min (by C-G formula based on SCr of 0.56 mg/dL). Liver Function Tests: No results for input(s): AST,  ALT, ALKPHOS, BILITOT, PROT, ALBUMIN in the last 168 hours. No results for input(s): LIPASE, AMYLASE in the last 168 hours. No results for input(s): AMMONIA in the last 168 hours. Coagulation Profile: No results for input(s): INR, PROTIME in the last 168 hours. Cardiac Enzymes: No results for input(s): CKTOTAL, CKMB, CKMBINDEX, TROPONINI in the last 168 hours. BNP (last 3 results) No results for input(s): PROBNP in the last 8760 hours. HbA1C:  Recent Labs  04/11/17 0254  HGBA1C 5.1   CBG:  Recent Labs Lab 04/11/17 0805 04/11/17 1159 04/11/17 1507 04/11/17 1716 04/11/17 2130  GLUCAP 96 128* 88 88 144*   Lipid Profile: No results for input(s): CHOL, HDL, LDLCALC, TRIG, CHOLHDL, LDLDIRECT in the last 72 hours. Thyroid Function Tests: No results for input(s): TSH, T4TOTAL, FREET4, T3FREE, THYROIDAB in the last 72 hours. Anemia Panel: No results for input(s): VITAMINB12, FOLATE, FERRITIN, TIBC, IRON, RETICCTPCT in the last 72 hours. Sepsis Labs:  Recent Labs Lab 04/07/17 0310 04/09/17 0301  PROCALCITON 1.55 1.36    Recent Results (from the past 240 hour(s))  Culture, blood (Routine x 2)     Status: Abnormal   Collection Time: 04/04/17 12:42 PM  Result Value Ref Range Status   Specimen Description BLOOD BLOOD RIGHT ARM  Final   Special Requests   Final    BOTTLES DRAWN AEROBIC AND ANAEROBIC Blood Culture adequate volume   Culture  Setup Time   Final    GRAM POSITIVE COCCI IN PAIRS IN CHAINS GRAM NEGATIVE RODS AEROBIC BOTTLE ONLY CRITICAL RESULT CALLED TO, READ BACK BY AND VERIFIED WITH: A PHAM,PHARMD AT 1437 04/05/17 BY L BENFIELD Performed at [facility], [place] [address], [place], [place] [address]    Culture (A)  Final    PROTEUS MIRABILIS GROUP B STREP(S.AGALACTIAE)ISOLATED    Report Status 04/09/2017 FINAL  Final   Organism ID, Bacteria PROTEUS MIRABILIS  Final   Organism ID, Bacteria GROUP B STREP(S.AGALACTIAE)ISOLATED  Final      Susceptibility    Group b strep(s.agalactiae)isolated - MIC*    CLINDAMYCIN >=1 RESISTANT Resistant     AMPICILLIN <=0.25 SENSITIVE Sensitive     ERYTHROMYCIN >=8 RESISTANT Resistant     VANCOMYCIN 0.5 SENSITIVE Sensitive     CEFTRIAXONE <=0.12 SENSITIVE Sensitive     LEVOFLOXACIN 1 SENSITIVE Sensitive     * GROUP B STREP(S.AGALACTIAE)ISOLATED   Proteus mirabilis - MIC*    AMPICILLIN <=2 SENSITIVE Sensitive     CEFAZOLIN 8 SENSITIVE Sensitive     CEFEPIME <=1  SENSITIVE Sensitive     CEFTAZIDIME <=1 SENSITIVE Sensitive     CEFTRIAXONE <=1 SENSITIVE Sensitive     CIPROFLOXACIN 0.5 SENSITIVE Sensitive     GENTAMICIN <=1 SENSITIVE Sensitive     IMIPENEM 4 SENSITIVE Sensitive     TRIMETH/SULFA <=20 SENSITIVE Sensitive     AMPICILLIN/SULBACTAM <=2 SENSITIVE Sensitive     PIP/TAZO <=4 SENSITIVE Sensitive     * PROTEUS MIRABILIS  Blood Culture ID Panel (Reflexed)     Status: Abnormal   Collection Time: 04/04/17 12:42 PM  Result Value Ref Range Status   Enterococcus species NOT DETECTED NOT DETECTED Final   Listeria monocytogenes NOT DETECTED NOT DETECTED Final   Staphylococcus species NOT DETECTED NOT DETECTED Final   Staphylococcus aureus NOT DETECTED NOT DETECTED Final   Streptococcus species DETECTED (A) NOT DETECTED Final    Comment: CRITICAL RESULT CALLED TO, READ BACK BY AND VERIFIED WITH: A PHAM,PHARMD AT 1437 04/05/17 BY L BENFIELD    Streptococcus agalactiae DETECTED (A) NOT DETECTED Final    Comment: CRITICAL RESULT CALLED TO, READ BACK BY AND VERIFIED WITH: A PHAM,PHARMD AT 1437 04/05/17 BY L BENFIELD    Streptococcus pneumoniae NOT DETECTED NOT DETECTED Final   Streptococcus pyogenes NOT DETECTED NOT DETECTED Final   Acinetobacter baumannii NOT DETECTED NOT DETECTED Final   Enterobacteriaceae species DETECTED (A) NOT DETECTED Final    Comment: Enterobacteriaceae represent a large family of gram-negative bacteria, not a single organism. CRITICAL RESULT CALLED TO, READ BACK BY AND VERIFIED  WITH: A PHAM,PHARMD AT 1437 04/05/17 BY L BENFIELD    Enterobacter cloacae complex NOT DETECTED NOT DETECTED Final   Escherichia coli NOT DETECTED NOT DETECTED Final   Klebsiella oxytoca NOT DETECTED NOT DETECTED Final   Klebsiella pneumoniae NOT DETECTED NOT DETECTED Final   Proteus species DETECTED (A) NOT DETECTED Final    Comment: CRITICAL RESULT CALLED TO, READ BACK BY AND VERIFIED WITH: A PHAM,PHARMD AT 1437 04/05/17 BY L BENFIELD    Serratia marcescens NOT DETECTED NOT DETECTED Final   Carbapenem resistance NOT DETECTED NOT DETECTED Final   Haemophilus influenzae NOT DETECTED NOT DETECTED Final   Neisseria meningitidis NOT DETECTED NOT DETECTED Final   Pseudomonas aeruginosa NOT DETECTED NOT DETECTED Final   Candida albicans NOT DETECTED NOT DETECTED Final   Candida glabrata NOT DETECTED NOT DETECTED Final   Candida krusei NOT DETECTED NOT DETECTED Final   Candida parapsilosis NOT DETECTED NOT DETECTED Final   Candida tropicalis NOT DETECTED NOT DETECTED Final    Comment: Performed at [facility], [place] [address], [place], [place] [address]  Urine culture     Status: Abnormal   Collection Time: 04/04/17  3:43 PM  Result Value Ref Range Status   Specimen Description URINE, CATHETERIZED  Final   Special Requests NONE  Final   Culture MULTIPLE ORGANISMS PRESENT, NONE PREDOMINANT (A)  Final   Report Status 04/05/2017 FINAL  Final  Culture, blood (Routine x 2)     Status: None   Collection Time: 04/04/17  6:54 PM  Result Value Ref Range Status   Specimen Description BLOOD LEFT ANTECUBITAL  Final   Special Requests IN PEDIATRIC BOTTLE Blood Culture adequate volume  Final   Culture   Final    NO GROWTH 5 DAYS Performed at [facility], [place] [address], [place], [place] [address]    Report Status 04/09/2017 FINAL  Final  MRSA PCR Screening     Status: None   Collection Time:  04/04/17  6:58 PM  Result Value Ref Range Status   MRSA by PCR NEGATIVE NEGATIVE Final     Comment:        The GeneXpert MRSA Assay (FDA approved for NASAL specimens only), is one component of a comprehensive MRSA colonization surveillance program. It is not intended to diagnose MRSA infection nor to guide or monitor treatment for MRSA infections.   Rapid strep screen (not at [facility])     Status: None   Collection Time: 04/09/17  9:18 AM  Result Value Ref Range Status   Streptococcus, Group A Screen (Direct) NEGATIVE NEGATIVE Final    Comment: (NOTE) A Rapid Antigen test may result negative if the antigen level in the sample is below the detection level of this test. The FDA has not cleared this test as a stand-alone test therefore the rapid antigen negative result has reflexed to a Group A Strep culture.   Culture, group A strep     Status: None   Collection Time: 04/09/17  9:18 AM  Result Value Ref Range Status   Specimen Description THROAT  Final   Special Requests NONE Reflexed from [id number]  Final   Culture   Final    NO GROUP A STREP (S.PYOGENES) ISOLATED Performed at [facility], [address] [place], [place] [address]    Report Status 04/11/2017 FINAL  Final  Culture, blood (routine x 2)     Status: None (Preliminary result)   Collection Time: 04/09/17  9:46 AM  Result Value Ref Range Status   Specimen Description BLOOD LEFT ARM  Final   Special Requests IN PEDIATRIC BOTTLE Blood Culture adequate volume  Final   Culture   Final    NO GROWTH 2 DAYS Performed at [facility], [place] [address] [place], [place] [address]    Report Status PENDING  Incomplete  Culture, blood (routine x 2)     Status: Abnormal (Preliminary result)   Collection Time: 04/09/17  9:46 AM  Result Value Ref Range Status   Specimen Description BLOOD RIGHT ARM  Final   Special Requests IN PEDIATRIC BOTTLE Blood Culture adequate volume  Final   Culture  Setup Time   Final    GRAM POSITIVE COCCI IN CLUSTERS IN PEDIATRIC BOTTLE CRITICAL RESULT CALLED TO, READ BACK BY AND  VERIFIED WITH: A. Pham Pharm.D. 12:10 04/10/17 ([email or web address])    Culture (A)  Final    STAPHYLOCOCCUS SPECIES (COAGULASE NEGATIVE) THE SIGNIFICANCE OF ISOLATING THIS ORGANISM FROM A SINGLE SET OF BLOOD CULTURES WHEN MULTIPLE SETS ARE DRAWN IS UNCERTAIN. PLEASE NOTIFY THE MICROBIOLOGY DEPARTMENT WITHIN ONE WEEK IF SPECIATION AND SENSITIVITIES ARE REQUIRED. Performed at [facility], [place] [address], [place], [place] [address]    Report Status PENDING  Incomplete  Blood Culture ID Panel (Reflexed)     Status: Abnormal   Collection Time: 04/09/17  9:46 AM  Result Value Ref Range Status   Enterococcus species NOT DETECTED NOT DETECTED Final   Listeria monocytogenes NOT DETECTED NOT DETECTED Final   Staphylococcus species DETECTED (A) NOT DETECTED Final    Comment: Methicillin (oxacillin) resistant coagulase negative staphylococcus. Possible blood culture contaminant (unless isolated from more than one blood culture draw or clinical case suggests pathogenicity). No antibiotic treatment is indicated for blood  culture contaminants. CRITICAL RESULT CALLED TO, READ BACK BY AND VERIFIED WITH: A. Pham Pharm.D. 12:10 04/10/17 ([email or web address])    Staphylococcus aureus NOT DETECTED NOT DETECTED Final   Methicillin resistance DETECTED (A)  NOT DETECTED Final    Comment: CRITICAL RESULT CALLED TO, READ BACK BY AND VERIFIED WITH: A. Pham Pharm.D. 12:10 04/10/17 ([email or web address])    Streptococcus species NOT DETECTED NOT DETECTED Final   Streptococcus agalactiae NOT DETECTED NOT DETECTED Final   Streptococcus pneumoniae NOT DETECTED NOT DETECTED Final   Streptococcus pyogenes NOT DETECTED NOT DETECTED Final   Acinetobacter baumannii NOT DETECTED NOT DETECTED Final   Enterobacteriaceae species NOT DETECTED NOT DETECTED Final   Enterobacter cloacae complex NOT DETECTED NOT DETECTED Final   Escherichia coli NOT DETECTED NOT DETECTED Final   Klebsiella oxytoca NOT DETECTED NOT DETECTED Final   Klebsiella pneumoniae NOT  DETECTED NOT DETECTED Final   Proteus species NOT DETECTED NOT DETECTED Final   Serratia marcescens NOT DETECTED NOT DETECTED Final   Haemophilus influenzae NOT DETECTED NOT DETECTED Final   Neisseria meningitidis NOT DETECTED NOT DETECTED Final   Pseudomonas aeruginosa NOT DETECTED NOT DETECTED Final   Candida albicans NOT DETECTED NOT DETECTED Final   Candida glabrata NOT DETECTED NOT DETECTED Final   Candida krusei NOT DETECTED NOT DETECTED Final   Candida parapsilosis NOT DETECTED NOT DETECTED Final   Candida tropicalis NOT DETECTED NOT DETECTED Final    Comment: Performed at [facility], [place] [address] [place], [place] [address]     Radiology Studies: Ct Pelvis W Contrast  Addendum Date: 04/11/2017   ADDENDUM REPORT: 04/11/2017 19:47 ADDENDUM: Additional finding to musculoskeletal section: Bilateral intramedullary femoral rods across old left distal shaft fracture. There is somewhat posterior peripheral positioning of the right femoral rod. There is a detached fixating screw within the medial, posterior tissues of the upper thigh/gluteal region. Electronically Signed   By: Donavan Foil M.D.   On: 04/11/2017 19:47   Addendum Date: 04/11/2017   ADDENDUM REPORT: 04/11/2017 19:28 ADDENDUM: Left external iliac and inguinal adenopathy likely reactive Electronically Signed   By: Donavan Foil M.D.   On: 04/11/2017 19:28   Result Date: 04/11/2017 CLINICAL DATA:  Sacral decubitus ulcer, fever EXAM: CT PELVIS WITH CONTRAST TECHNIQUE: Multidetector CT imaging of the pelvis was performed using the standard protocol following the bolus administration of intravenous contrast. CONTRAST:  145mL ISOVUE-300 IOPAMIDOL (ISOVUE-300) INJECTION 61% COMPARISON:  03/15/2016, 03/13/2016 FINDINGS: Urinary Tract: Foley catheter in the bladder with small air present in the bladder. Very thick-walled appearance of the bladder. Low lying left kidney containing multiple stones, measuring up to 6 mm in the lower  pole. No obvious hydronephrosis. Bowel: Mild rectum thickening. Left lower quadrant colostomy with large amount of stool. Vascular/Lymphatic: Aortic atherosclerosis. subcentimeter periaortic lymph nodes. Subcentimeter pelvic sidewall lymph nodes. Enlarged left external iliac nodes measuring up to 2.1 cm. Multiple enlarged left inguinal lymph nodes measuring up to 2.2 cm in short axis. Reproductive:  Status post hysterectomy.  No adnexal masses. Other: Diffuse subcutaneous edema. Mild very rectal soft tissue stranding and small amount of fluid in the pelvis. Musculoskeletal: Large, deep sacral decubitus ulcer, measuring at least 6.7 cm transverse. Underlying bony sclerosis and erosive changes consistent with chronic osteomyelitis. Soft tissue thickening around the left posterior SI joint with bony erosive changes present. Fluid and gas collection anterior to the left SI joint and abutting or involving the left iliacus muscle, consistent with abscess. This measures 2.5 by 2.1 by 4 cm. IMPRESSION: 1. Deep sacral decubitus ulcer down to the bone with underlying sclerosis and extensive erosive changes, consistent with osteomyelitis. More acute appearing soft tissue and erosive change involving the left posterior SI joint. There  is gas and mildly rim enhancing fluid collection anterior to the left SI joint which appears contiguous with the joint space, this measures 4 cm cranial caudad by 2.1 cm transverse. The collective findings are concerning for left septic arthritis of the sacroiliac joint with associated abscess anterior to the joint. 2. Low lying left kidney with multiple stones. Thick-walled urinary bladder with Foley catheter could be due to cystitis 3. Mild rectal wall thickening, query proctitis. Electronically Signed: By: Donavan Foil M.D. On: 04/11/2017 19:00    Scheduled Meds: . acidophilus  2 capsule Oral TID  . aspirin EC  81 mg Oral Daily  . enoxaparin (LOVENOX) injection  40 mg Subcutaneous Q24H   . feeding supplement (ENSURE ENLIVE)  237 mL Oral BID BM  . fluticasone  2 spray Each Nare Daily  . insulin aspart  0-15 Units Subcutaneous TID WC  . insulin aspart  0-5 Units Subcutaneous QHS  . mouth rinse  15 mL Mouth Rinse BID  . multivitamin with minerals  1 tablet Oral Daily  . potassium chloride  10 mEq Oral Daily  . senna-docusate  1 tablet Oral BID  . sodium chloride flush  3 mL Intravenous Q12H  . torsemide  10 mg Oral Daily   Continuous Infusions: . cefTRIAXone (ROCEPHIN)  IV Stopped (04/11/17 1414)     LOS: 8 days    Time spent more than an hour  Arlando Leisinger, MD PhD [facility] Pager [phone]  If 7PM-7AM, please contact night-coverage [email or web address] Password [email or web address] 04/12/2017, 8:00 AM

## 2017-04-12 NOTE — Consult Note (Signed)
[facility]    Date of Admission:  04/04/2017           Day 9 ceftriaxone       Reason for Consult: Polymicrobial bacteremia in the setting of chronic sacral decubitus ulcer    Referring Provider: Dr. Florencia Reasons  Assessment: She had fever and chills caused by transient polymicrobial bacteremia. She may have left sacroiliitis but it is very difficult to interpret CT scan findings in this patient with known chronic sacral decubitus and previous pelvic osteomyelitis. The bacteremia may have been from seeding from her open wound or the sacroiliitis. She improved promptly with ceftriaxone therapy. I talked to her about treatment options including 6 weeks of IV antibiotics again or a trial of oral antibiotic therapy. I recommend trying oral cephalexin and metronidazole and will arrange follow-up in my clinic within 3-4 weeks. I will contact [facility] to review their notes about her wound and wound care.   Plan: 1. Change ceftriaxone to oral cephalexin and metronidazole 2. I will arrange follow-up in my clinic within the next 3-4 weeks 3. I will sign off now   Principal Problem:   Streptococcal bacteremia Active Problems:   Sacral decubitus ulcer, stage IV (HCC)   Sacroiliitis (HCC)   Gram-negative bacteremia   Paraplegia (HCC)   Morbid obesity (HCC)   Type 2 diabetes mellitus (HCC)   HTN (hypertension)   Neurogenic bladder   Obstructive sleep apnea   Microcytic anemia   Protein-calorie malnutrition (HCC)   Multinodular goiter   Hypercalcemia   Acute on chronic combined systolic and diastolic CHF (congestive heart failure) ([place])   . acidophilus  2 capsule Oral TID  . aspirin EC  81 mg Oral Daily  . enoxaparin (LOVENOX) injection  40 mg Subcutaneous Q24H  . feeding supplement (ENSURE ENLIVE)  237 mL Oral BID BM  . fluticasone  2 spray Each Nare Daily  . insulin aspart  0-15 Units Subcutaneous TID WC  . insulin aspart  0-5 Units  Subcutaneous QHS  . mouth rinse  15 mL Mouth Rinse BID  . metoprolol tartrate  2.5 mg Intravenous Q4H  . multivitamin with minerals  1 tablet Oral Daily  . potassium chloride  10 mEq Oral Daily  . senna-docusate  1 tablet Oral BID  . sodium chloride flush  3 mL Intravenous Q12H  . sodium hypochlorite   Irrigation BID  . spironolactone  25 mg Oral Daily  . torsemide  10 mg Oral Daily    HPI: Sharon Warner is a 66 y.o. 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 She moved to [place] about one year ago. Shortly before the move she developed a sacral decubitus ulcer. She was hospitalized here last year with sacral osteomyelitis. That resolved after 6 weeks of IV ceftriaxone and oral metronidazole. She was released from [facility] sometime last year and has been living at home with her husband. [facility] nurses come 3 times each week to help care for her wound. She understands that her wound has been healing slowly. She recently developed fever and chills leading to her admission on 04/04/2017. No new wound drainage or infection was noted. She was treated with IV ceftriaxone for presumed UTI. Urine culture grew multiple organisms. A pelvic CT scan was done yesterday which showed the decubitus ulcer and some enhancement and fluid around the left sacroiliac joint. One  of 2 admission blood cultures grew Proteus and group B strep. She states that she feels like she is back to normal. Her fever and chills resolved promptly after admission.   Review of Systems: Review of Systems  Constitutional: Negative for chills, diaphoresis, fever, malaise/fatigue and weight loss.  Respiratory: Negative for cough.   Cardiovascular: Negative for chest pain.  Gastrointestinal: Positive for constipation. Negative for abdominal pain, diarrhea, nausea and vomiting.       She has had problems with very hard stool in her colostomy bag.  Genitourinary:        Chronic indwelling Foley catheter changed as needed.  Neurological: Negative for dizziness and headaches.    Past Medical History:  Diagnosis Date  . Acute on chronic systolic CHF (congestive heart failure) ([place])   . Chronic heart failure ([place])   . Chronic indwelling Foley catheter    last changed few days ago  . Diabetes mellitus without complication ([place])   . Enlarged thoracic aorta ([place]) 03/14/2016  . H/O paraplegia    ascending paralysis unclear etiology  . Hypertension   . Lumbar spondylosis   . Morbid obesity ([place])   . OSA on CPAP   . Sacral decubitus ulcer, stage IV ([place])     Social History  Substance Use Topics  . Smoking status: Never Smoker  . Smokeless tobacco: Never Used  . Alcohol use No    Family History  Problem Relation Age of Onset  . Hypothyroidism Mother   . Diabetes Maternal Grandmother   . Heart disease Maternal Grandfather    Allergies  Allergen Reactions  . Levaquin [Levofloxacin In D5w] Nausea Only    Unknown childhood allergy  . Penicillins Other (See Comments)    Has patient had a PCN reaction causing immediate rash, facial/tongue/throat swelling, SOB or lightheadedness with hypotension: Unknown Has patient had a PCN reaction causing severe rash involving mucus membranes or skin necrosis: Unknown Has patient had a PCN reaction that required hospitalization: No Has patient had a PCN reaction occurring within the last 10 years: No If all of the above answers are "NO", then may proceed with Cephalosporin use.    OBJECTIVE: Blood pressure (!) 91/46, pulse (!) 108, temperature 99.4 F (37.4 C), resp. rate (!) 31, height 5\' 1"  (1.549 m), weight 165 lb 12.6 oz (75.2 kg), SpO2 97 %.  Physical Exam  Constitutional: She is oriented to person, place, and time.  She is resting quietly in bed. She is in good spirits.  HENT:  Mouth/Throat: No oropharyngeal exudate.  Cardiovascular: Normal rate and regular rhythm.   No murmur  heard. Pulmonary/Chest: Effort normal and breath sounds normal. She has no wheezes. She has no rales.  Abdominal: Soft. She exhibits no distension. There is no tenderness.  Left-sided colostomy bag.  Neurological: She is alert and oriented to person, place, and time.  Skin:  Bruising of her right shin.  Psychiatric: Mood and affect normal.    Lab Results Lab Results  Component Value Date   WBC 15.3 (H) 04/12/2017   HGB 11.1 (L) 04/12/2017   HCT 36.4 04/12/2017   MCV 77.6 (L) 04/12/2017   PLT 456 (H) 04/12/2017    Lab Results  Component Value Date   CREATININE 0.56 04/12/2017   BUN 22 (H) 04/12/2017   NA 136 04/12/2017   K 4.3 04/12/2017   CL 93 (L) 04/12/2017   CO2 31 04/12/2017    Lab Results  Component Value Date   ALT 14 04/05/2017  AST 22 04/05/2017   ALKPHOS 56 04/05/2017   BILITOT 0.6 04/05/2017     Microbiology: Recent Results (from the past 240 hour(s))  Culture, blood (Routine x 2)     Status: Abnormal   Collection Time: 04/04/17 12:42 PM  Result Value Ref Range Status   Specimen Description BLOOD BLOOD RIGHT ARM  Final   Special Requests   Final    BOTTLES DRAWN AEROBIC AND ANAEROBIC Blood Culture adequate volume   Culture  Setup Time   Final    GRAM POSITIVE COCCI IN PAIRS IN CHAINS GRAM NEGATIVE RODS AEROBIC BOTTLE ONLY CRITICAL RESULT CALLED TO, READ BACK BY AND VERIFIED WITH: A PHAM,PHARMD AT 1437 04/05/17 BY L BENFIELD Performed at [facility], [place] [address], [place], [place] [address]    Culture (A)  Final    PROTEUS MIRABILIS GROUP B STREP(S.AGALACTIAE)ISOLATED    Report Status 04/09/2017 FINAL  Final   Organism ID, Bacteria PROTEUS MIRABILIS  Final   Organism ID, Bacteria GROUP B STREP(S.AGALACTIAE)ISOLATED  Final      Susceptibility   Group b strep(s.agalactiae)isolated - MIC*    CLINDAMYCIN >=1 RESISTANT Resistant     AMPICILLIN <=0.25 SENSITIVE Sensitive     ERYTHROMYCIN >=8 RESISTANT Resistant     VANCOMYCIN 0.5 SENSITIVE  Sensitive     CEFTRIAXONE <=0.12 SENSITIVE Sensitive     LEVOFLOXACIN 1 SENSITIVE Sensitive     * GROUP B STREP(S.AGALACTIAE)ISOLATED   Proteus mirabilis - MIC*    AMPICILLIN <=2 SENSITIVE Sensitive     CEFAZOLIN 8 SENSITIVE Sensitive     CEFEPIME <=1 SENSITIVE Sensitive     CEFTAZIDIME <=1 SENSITIVE Sensitive     CEFTRIAXONE <=1 SENSITIVE Sensitive     CIPROFLOXACIN 0.5 SENSITIVE Sensitive     GENTAMICIN <=1 SENSITIVE Sensitive     IMIPENEM 4 SENSITIVE Sensitive     TRIMETH/SULFA <=20 SENSITIVE Sensitive     AMPICILLIN/SULBACTAM <=2 SENSITIVE Sensitive     PIP/TAZO <=4 SENSITIVE Sensitive     * PROTEUS MIRABILIS  Blood Culture ID Panel (Reflexed)     Status: Abnormal   Collection Time: 04/04/17 12:42 PM  Result Value Ref Range Status   Enterococcus species NOT DETECTED NOT DETECTED Final   Listeria monocytogenes NOT DETECTED NOT DETECTED Final   Staphylococcus species NOT DETECTED NOT DETECTED Final   Staphylococcus aureus NOT DETECTED NOT DETECTED Final   Streptococcus species DETECTED (A) NOT DETECTED Final    Comment: CRITICAL RESULT CALLED TO, READ BACK BY AND VERIFIED WITH: A PHAM,PHARMD AT 1437 04/05/17 BY L BENFIELD    Streptococcus agalactiae DETECTED (A) NOT DETECTED Final    Comment: CRITICAL RESULT CALLED TO, READ BACK BY AND VERIFIED WITH: A PHAM,PHARMD AT 1437 04/05/17 BY L BENFIELD    Streptococcus pneumoniae NOT DETECTED NOT DETECTED Final   Streptococcus pyogenes NOT DETECTED NOT DETECTED Final   Acinetobacter baumannii NOT DETECTED NOT DETECTED Final   Enterobacteriaceae species DETECTED (A) NOT DETECTED Final    Comment: Enterobacteriaceae represent a large family of gram-negative bacteria, not a single organism. CRITICAL RESULT CALLED TO, READ BACK BY AND VERIFIED WITH: A PHAM,PHARMD AT 1437 04/05/17 BY L BENFIELD    Enterobacter cloacae complex NOT DETECTED NOT DETECTED Final   Escherichia coli NOT DETECTED NOT DETECTED Final   Klebsiella oxytoca NOT DETECTED  NOT DETECTED Final   Klebsiella pneumoniae NOT DETECTED NOT DETECTED Final   Proteus species DETECTED (A) NOT DETECTED Final    Comment: CRITICAL RESULT CALLED TO, READ BACK  BY AND VERIFIED WITH: A PHAM,PHARMD AT 1437 04/05/17 BY L BENFIELD    Serratia marcescens NOT DETECTED NOT DETECTED Final   Carbapenem resistance NOT DETECTED NOT DETECTED Final   Haemophilus influenzae NOT DETECTED NOT DETECTED Final   Neisseria meningitidis NOT DETECTED NOT DETECTED Final   Pseudomonas aeruginosa NOT DETECTED NOT DETECTED Final   Candida albicans NOT DETECTED NOT DETECTED Final   Candida glabrata NOT DETECTED NOT DETECTED Final   Candida krusei NOT DETECTED NOT DETECTED Final   Candida parapsilosis NOT DETECTED NOT DETECTED Final   Candida tropicalis NOT DETECTED NOT DETECTED Final    Comment: Performed at [facility], [place] [address], [place], [place] [address]  Urine culture     Status: Abnormal   Collection Time: 04/04/17  3:43 PM  Result Value Ref Range Status   Specimen Description URINE, CATHETERIZED  Final   Special Requests NONE  Final   Culture MULTIPLE ORGANISMS PRESENT, NONE PREDOMINANT (A)  Final   Report Status 04/05/2017 FINAL  Final  Culture, blood (Routine x 2)     Status: None   Collection Time: 04/04/17  6:54 PM  Result Value Ref Range Status   Specimen Description BLOOD LEFT ANTECUBITAL  Final   Special Requests IN PEDIATRIC BOTTLE Blood Culture adequate volume  Final   Culture   Final    NO GROWTH 5 DAYS Performed at [facility], [place] [address], [place], [place] [address]    Report Status 04/09/2017 FINAL  Final  MRSA PCR Screening     Status: None   Collection Time: 04/04/17  6:58 PM  Result Value Ref Range Status   MRSA by PCR NEGATIVE NEGATIVE Final    Comment:        The GeneXpert MRSA Assay (FDA approved for NASAL specimens only), is one component of a comprehensive MRSA colonization surveillance program. It is not intended to diagnose  MRSA infection nor to guide or monitor treatment for MRSA infections.   Rapid strep screen (not at [facility])     Status: None   Collection Time: 04/09/17  9:18 AM  Result Value Ref Range Status   Streptococcus, Group A Screen (Direct) NEGATIVE NEGATIVE Final    Comment: (NOTE) A Rapid Antigen test may result negative if the antigen level in the sample is below the detection level of this test. The FDA has not cleared this test as a stand-alone test therefore the rapid antigen negative result has reflexed to a Group A Strep culture.   Culture, group A strep     Status: None   Collection Time: 04/09/17  9:18 AM  Result Value Ref Range Status   Specimen Description THROAT  Final   Special Requests NONE Reflexed from [id number]  Final   Culture   Final    NO GROUP A STREP (S.PYOGENES) ISOLATED Performed at [facility], [address], [place], [place] [address]    Report Status 04/11/2017 FINAL  Final  Culture, blood (routine x 2)     Status: None (Preliminary result)   Collection Time: 04/09/17  9:46 AM  Result Value Ref Range Status   Specimen Description BLOOD LEFT ARM  Final   Special Requests IN PEDIATRIC BOTTLE Blood Culture adequate volume  Final   Culture   Final    NO GROWTH 2 DAYS Performed at [facility], [place] [address], [place], [place] [address]    Report Status PENDING  Incomplete  Culture, blood (routine x  2)     Status: Abnormal (Preliminary result)   Collection Time: 04/09/17  9:46 AM  Result Value Ref Range Status   Specimen Description BLOOD RIGHT ARM  Final   Special Requests IN PEDIATRIC BOTTLE Blood Culture adequate volume  Final   Culture  Setup Time   Final    GRAM POSITIVE COCCI IN CLUSTERS IN PEDIATRIC BOTTLE CRITICAL RESULT CALLED TO, READ BACK BY AND VERIFIED WITH: A. Pham Pharm.D. 12:10 04/10/17 ([email or web address])    Culture (A)  Final    STAPHYLOCOCCUS SPECIES (COAGULASE NEGATIVE) THE SIGNIFICANCE OF ISOLATING THIS ORGANISM FROM A SINGLE SET OF  BLOOD CULTURES WHEN MULTIPLE SETS ARE DRAWN IS UNCERTAIN. PLEASE NOTIFY THE MICROBIOLOGY DEPARTMENT WITHIN ONE WEEK IF SPECIATION AND SENSITIVITIES ARE REQUIRED. Performed at [facility], [place] [address] [place], [place] [address]    Report Status PENDING  Incomplete  Blood Culture ID Panel (Reflexed)     Status: Abnormal   Collection Time: 04/09/17  9:46 AM  Result Value Ref Range Status   Enterococcus species NOT DETECTED NOT DETECTED Final   Listeria monocytogenes NOT DETECTED NOT DETECTED Final   Staphylococcus species DETECTED (A) NOT DETECTED Final    Comment: Methicillin (oxacillin) resistant coagulase negative staphylococcus. Possible blood culture contaminant (unless isolated from more than one blood culture draw or clinical case suggests pathogenicity). No antibiotic treatment is indicated for blood  culture contaminants. CRITICAL RESULT CALLED TO, READ BACK BY AND VERIFIED WITH: A. Pham Pharm.D. 12:10 04/10/17 ([email or web address])    Staphylococcus aureus NOT DETECTED NOT DETECTED Final   Methicillin resistance DETECTED (A) NOT DETECTED Final    Comment: CRITICAL RESULT CALLED TO, READ BACK BY AND VERIFIED WITH: A. Pham Pharm.D. 12:10 04/10/17 ([email or web address])    Streptococcus species NOT DETECTED NOT DETECTED Final   Streptococcus agalactiae NOT DETECTED NOT DETECTED Final   Streptococcus pneumoniae NOT DETECTED NOT DETECTED Final   Streptococcus pyogenes NOT DETECTED NOT DETECTED Final   Acinetobacter baumannii NOT DETECTED NOT DETECTED Final   Enterobacteriaceae species NOT DETECTED NOT DETECTED Final   Enterobacter cloacae complex NOT DETECTED NOT DETECTED Final   Escherichia coli NOT DETECTED NOT DETECTED Final   Klebsiella oxytoca NOT DETECTED NOT DETECTED Final   Klebsiella pneumoniae NOT DETECTED NOT DETECTED Final   Proteus species NOT DETECTED NOT DETECTED Final   Serratia marcescens NOT DETECTED NOT DETECTED Final   Haemophilus influenzae NOT DETECTED NOT DETECTED Final    Neisseria meningitidis NOT DETECTED NOT DETECTED Final   Pseudomonas aeruginosa NOT DETECTED NOT DETECTED Final   Candida albicans NOT DETECTED NOT DETECTED Final   Candida glabrata NOT DETECTED NOT DETECTED Final   Candida krusei NOT DETECTED NOT DETECTED Final   Candida parapsilosis NOT DETECTED NOT DETECTED Final   Candida tropicalis NOT DETECTED NOT DETECTED Final    Comment: Performed at [facility], [place] [address], [place], [place] [address]    Michel Bickers, [facility] [phone] pager   [phone] cell 04/12/2017, 10:05 AM

## 2017-04-12 NOTE — Consult Note (Signed)
[facility] Nurse wound follow up Reason for Consult: Chronic stage 4 pressure injury to sacrum, present on admission.  Asked to re-consult due to increased purulence in wound. MD has evaluated and written new orders for [facility] over the weekend.  I discontinued the previous WOC orders of Aquacel Ag. MD orders as follows:  decubitus with bacterial colonization but no necrosis/infeection.  [facility] BID over weekend, then NS.  Do BID while inpatient.  Consider hydroTx on Mon  -consider Vanco/Zosyn for better soft tissue control. - d/w Dr Megan Salon w ID.  Defer to him for final recs  Wound type:stage 4 pressure injury Pressure Injury POA: Yes Measurement: 6 cm x 4 cm x 2.4 cm  Wound HKU:VJDY pink, friable and nongranulating.   Drainage (amount, consistency, odor)Reported increase in purulence  Periwound:intact Will not follow at this time.  Please re-consult if needed.  Domenic Moras RN BSN [place] Pager [phone]

## 2017-04-12 NOTE — Progress Notes (Signed)
[facility]  [address] [place], [place] [address] Phone: [phone]  FAX: [phone]      Timea Breed [id number] [birth year]  CARE TEAM:  PCP: [name], MD  Outpatient Care Team: Patient Care Team: [name], MD as PCP - General (Family Medicine) [place], [place], [place] as Consulting Physician (Family Medicine)  Inpatient Treatment Team: Treatment Team: Attending Provider: Florencia Reasons, MD; Rounding Team: Ian Bushman, MD; Consulting Physician: Lbcardiology, Michae Kava, MD; Consulting Physician: Edison Pace, Md, MD; Registered Nurse: Hoyle Barr, RN; Technician: Staci Righter, NT   Problem List:   Principal Problem:   Sepsis [facility]) Active Problems:   Paraplegia ([place])   Morbid obesity ([place])   HTN (hypertension)   Neurogenic bladder   Obstructive sleep apnea   Protein-calorie malnutrition ([place])   Hypercalcemia   Chronic diastolic heart failure ([place])   Sacral decubitus ulcer, stage IV ([place])   Acute on chronic combined systolic and diastolic CHF (congestive heart failure) ([place])           Assessment  Sepsis stabilizing  Plan:  -agree w Ortho consult to r/o hip joint infection  -decubitus with bacterial colonization but no necrosis/infeection.  [place] BID over weekend, then NS.  Do BID while inpatient.  Consider hydroTx on Mon  -consider Vanco/Zosyn for better soft tissue control. - d/w Dr Megan Salon w ID.  Defer to him for final recs  -VTE prophylaxis- SCDs, etc  -mild diversion proctitis expected in rectal stump  The patient is stable.  There is no evidence of peritonitis, acute abdomen, nor shock.  There is no strong evidence of failure of improvement nor decline with current non-operative management.  There is no need for surgery at the present moment.  We will continue to follow on a weekly basis   20 minutes spent in review, evaluation, examination, counseling, and coordination of care.  More  than 50% of that time was spent in counseling.  Adin Hector, M.D., F.A.C.S. Gastrointestinal and Minimally Invasive Surgery [facility] [address] [place],  [address] [phone] Main / Paging   04/12/2017    Subjective: (Chief complaint)  Denies pain RN in room  Objective:  Vital signs:  Vitals:   04/12/17 0400 04/12/17 0600 04/12/17 0730 04/12/17 0900  BP: 126/65 (!) 107/48    Pulse: (!) 115 (!) 111    Resp: (!) 22 (!) 28    Temp: 98.3 F (36.8 C)  99.4 F (37.4 C) 99.4 F (37.4 C)  TempSrc: Oral  Oral   SpO2: 97% 99%    Weight:      Height:        Last BM Date: 04/11/17  Intake/Output   Yesterday:  07/13 0701 - 07/14 0700 In: 1310 [P.O.:1260; IV Piggyback:50] Out: 4700 [Urine:4700] This shift:  No intake/output data recorded.  Bowel function:  Flatus: YES  BM:  YES  Drain: (No drain)   Physical Exam:  General: Pt awake/alert/oriented x4 in no acute distress Eyes: PERRL, normal EOM.  Sclera clear.  No icterus Neuro: CN II-XII intact w/o upper focal sensory/motor deficits.  Paraplegia unchanged Lymph: No head/neck/groin lymphadenopathy Psych:  No delerium/psychosis/paranoia HENT: Normocephalic, Mucus membranes moist.  No thrush Neck: Supple, No tracheal deviation Chest: No chest wall pain w good excursion CV:  Pulses intact.  Regular rhythm MS: Normal AROM mjr joints.  No obvious deformity  Abdomen: Soft.  Nondistended.  Nontender.  No evidence of peritonitis.  No incarcerated  hernias.  Colostomy pink w hard stool  Ext:  Chronic BLE atrophy c/w paraplegia.  No mjr edema.  No cyanosis Skin: No petechiae / purpura  Results:   Labs: Results for orders placed or performed during the hospital encounter of 04/04/17 (from the past 48 hour(s))  Glucose, capillary     Status: Abnormal   Collection Time: 04/10/17 12:04 PM  Result Value Ref Range   Glucose-Capillary 104 (H) 65 - 99 mg/dL    Comment 1 Notify RN    Comment 2 Document in Chart   Glucose, capillary     Status: Abnormal   Collection Time: 04/10/17  4:17 PM  Result Value Ref Range   Glucose-Capillary 133 (H) 65 - 99 mg/dL   Comment 1 Notify RN    Comment 2 Document in Chart   Glucose, capillary     Status: Abnormal   Collection Time: 04/10/17  9:08 PM  Result Value Ref Range   Glucose-Capillary 101 (H) 65 - 99 mg/dL   Comment 1 Notify RN    Comment 2 Document in Chart   Basic metabolic panel     Status: Abnormal   Collection Time: 04/11/17  2:54 AM  Result Value Ref Range   Sodium 137 135 - 145 mmol/L   Potassium 3.5 3.5 - 5.1 mmol/L   Chloride 98 (L) 101 - 111 mmol/L   CO2 31 22 - 32 mmol/L   Glucose, Bld 122 (H) 65 - 99 mg/dL   BUN 21 (H) 6 - 20 mg/dL   Creatinine, Ser 0.53 0.44 - 1.00 mg/dL   Calcium 9.7 8.9 - 10.3 mg/dL   GFR calc non Af Amer >60 >60 mL/min   GFR calc Af Amer >60 >60 mL/min    Comment: (NOTE) The eGFR has been calculated using the CKD EPI equation. This calculation has not been validated in all clinical situations. eGFR's persistently <60 mL/min signify possible Chronic Kidney Disease.    Anion gap 8 5 - 15  Hemoglobin A1c     Status: None   Collection Time: 04/11/17  2:54 AM  Result Value Ref Range   Hgb A1c MFr Bld 5.1 4.8 - 5.6 %    Comment: (NOTE)         Pre-diabetes: 5.7 - 6.4         Diabetes: >6.4         Glycemic control for adults with diabetes: <7.0    Mean Plasma Glucose 100 mg/dL    Comment: (NOTE) Performed At: [facility] [address] [place], [place] [id number] Lindon Romp MD EH:[id number]   Glucose, capillary     Status: None   Collection Time: 04/11/17  8:05 AM  Result Value Ref Range   Glucose-Capillary 96 65 - 99 mg/dL  Glucose, capillary     Status: Abnormal   Collection Time: 04/11/17 11:59 AM  Result Value Ref Range   Glucose-Capillary 128 (H) 65 - 99 mg/dL  Glucose, capillary     Status: None   Collection Time: 04/11/17   3:07 PM  Result Value Ref Range   Glucose-Capillary 88 65 - 99 mg/dL   Comment 1 Notify RN    Comment 2 Document in Chart   Glucose, capillary     Status: None   Collection Time: 04/11/17  5:16 PM  Result Value Ref Range   Glucose-Capillary 88 65 - 99 mg/dL   Comment 1 Document in Chart   Glucose, capillary     Status: Abnormal  Collection Time: 04/11/17  9:30 PM  Result Value Ref Range   Glucose-Capillary 144 (H) 65 - 99 mg/dL  Basic metabolic panel     Status: Abnormal   Collection Time: 04/12/17  3:34 AM  Result Value Ref Range   Sodium 136 135 - 145 mmol/L   Potassium 4.3 3.5 - 5.1 mmol/L    Comment: DELTA CHECK NOTED NO VISIBLE HEMOLYSIS    Chloride 93 (L) 101 - 111 mmol/L   CO2 31 22 - 32 mmol/L   Glucose, Bld 133 (H) 65 - 99 mg/dL   BUN 22 (H) 6 - 20 mg/dL   Creatinine, Ser 0.56 0.44 - 1.00 mg/dL   Calcium 9.8 8.9 - 10.3 mg/dL   GFR calc non Af Amer >60 >60 mL/min   GFR calc Af Amer >60 >60 mL/min    Comment: (NOTE) The eGFR has been calculated using the CKD EPI equation. This calculation has not been validated in all clinical situations. eGFR's persistently <60 mL/min signify possible Chronic Kidney Disease.    Anion gap 12 5 - 15  CBC with Differential/Platelet     Status: Abnormal   Collection Time: 04/12/17  3:34 AM  Result Value Ref Range   WBC 15.3 (H) 4.0 - 10.5 K/uL   RBC 4.69 3.87 - 5.11 MIL/uL   Hemoglobin 11.1 (L) 12.0 - 15.0 g/dL   HCT 36.4 36.0 - 46.0 %   MCV 77.6 (L) 78.0 - 100.0 fL   MCH 23.7 (L) 26.0 - 34.0 pg   MCHC 30.5 30.0 - 36.0 g/dL   RDW 16.8 (H) 11.5 - 15.5 %   Platelets 456 (H) 150 - 400 K/uL   Neutrophils Relative % 68 %   Neutro Abs 10.5 (H) 1.7 - 7.7 K/uL   Lymphocytes Relative 24 %   Lymphs Abs 3.6 0.7 - 4.0 K/uL   Monocytes Relative 7 %   Monocytes Absolute 1.1 (H) 0.1 - 1.0 K/uL   Eosinophils Relative 1 %   Eosinophils Absolute 0.1 0.0 - 0.7 K/uL   Basophils Relative 0 %   Basophils Absolute 0.0 0.0 - 0.1 K/uL   Magnesium     Status: None   Collection Time: 04/12/17  3:34 AM  Result Value Ref Range   Magnesium 2.1 1.7 - 2.4 mg/dL  Glucose, capillary     Status: Abnormal   Collection Time: 04/12/17  8:39 AM  Result Value Ref Range   Glucose-Capillary 214 (H) 65 - 99 mg/dL    Imaging / Studies: Ct Pelvis W Contrast  Addendum Date: 04/11/2017   ADDENDUM REPORT: 04/11/2017 19:47 ADDENDUM: Additional finding to musculoskeletal section: Bilateral intramedullary femoral rods across old left distal shaft fracture. There is somewhat posterior peripheral positioning of the right femoral rod. There is a detached fixating screw within the medial, posterior tissues of the upper thigh/gluteal region. Electronically Signed   By: Donavan Foil M.D.   On: 04/11/2017 19:47   Addendum Date: 04/11/2017   ADDENDUM REPORT: 04/11/2017 19:28 ADDENDUM: Left external iliac and inguinal adenopathy likely reactive Electronically Signed   By: Donavan Foil M.D.   On: 04/11/2017 19:28   Result Date: 04/11/2017 CLINICAL DATA:  Sacral decubitus ulcer, fever EXAM: CT PELVIS WITH CONTRAST TECHNIQUE: Multidetector CT imaging of the pelvis was performed using the standard protocol following the bolus administration of intravenous contrast. CONTRAST:  174m ISOVUE-300 IOPAMIDOL (ISOVUE-300) INJECTION 61% COMPARISON:  03/15/2016, 03/13/2016 FINDINGS: Urinary Tract: Foley catheter in the bladder with small air present in the bladder.  Very thick-walled appearance of the bladder. Low lying left kidney containing multiple stones, measuring up to 6 mm in the lower pole. No obvious hydronephrosis. Bowel: Mild rectum thickening. Left lower quadrant colostomy with large amount of stool. Vascular/Lymphatic: Aortic atherosclerosis. subcentimeter periaortic lymph nodes. Subcentimeter pelvic sidewall lymph nodes. Enlarged left external iliac nodes measuring up to 2.1 cm. Multiple enlarged left inguinal lymph nodes measuring up to 2.2 cm in short axis.  Reproductive:  Status post hysterectomy.  No adnexal masses. Other: Diffuse subcutaneous edema. Mild very rectal soft tissue stranding and small amount of fluid in the pelvis. Musculoskeletal: Large, deep sacral decubitus ulcer, measuring at least 6.7 cm transverse. Underlying bony sclerosis and erosive changes consistent with chronic osteomyelitis. Soft tissue thickening around the left posterior SI joint with bony erosive changes present. Fluid and gas collection anterior to the left SI joint and abutting or involving the left iliacus muscle, consistent with abscess. This measures 2.5 by 2.1 by 4 cm. IMPRESSION: 1. Deep sacral decubitus ulcer down to the bone with underlying sclerosis and extensive erosive changes, consistent with osteomyelitis. More acute appearing soft tissue and erosive change involving the left posterior SI joint. There is gas and mildly rim enhancing fluid collection anterior to the left SI joint which appears contiguous with the joint space, this measures 4 cm cranial caudad by 2.1 cm transverse. The collective findings are concerning for left septic arthritis of the sacroiliac joint with associated abscess anterior to the joint. 2. Low lying left kidney with multiple stones. Thick-walled urinary bladder with Foley catheter could be due to cystitis 3. Mild rectal wall thickening, query proctitis. Electronically Signed: By: Donavan Foil M.D. On: 04/11/2017 19:00   Dg Chest Port 1 View  Result Date: 04/12/2017 CLINICAL DATA:  Shortness of breath today EXAM: PORTABLE CHEST 1 VIEW COMPARISON:  04/07/2017 FINDINGS: Cardiomegaly with vascular congestion. Left lower lobe atelectasis or infiltrate is similar to prior study. Probable small left effusion. No overt edema. No acute bony abnormality. IMPRESSION: Small left effusion with left lower lobe atelectasis or infiltrate. Cardiomegaly. Electronically Signed   By: Rolm Baptise M.D.   On: 04/12/2017 09:15    Medications / Allergies: per  chart  Antibiotics: Anti-infectives    Start     Dose/Rate Route Frequency Ordered Stop   04/06/17 1400  cefTRIAXone (ROCEPHIN) 2 g in dextrose 5 % 50 mL IVPB     2 g 100 mL/hr over 30 Minutes Intravenous Every 24 hours 04/05/17 1417     04/05/17 1400  cefTRIAXone (ROCEPHIN) 1 g in dextrose 5 % 50 mL IVPB  Status:  Discontinued     1 g 100 mL/hr over 30 Minutes Intravenous Every 24 hours 04/04/17 1235 04/05/17 1417   04/04/17 1230  cefTRIAXone (ROCEPHIN) 2 g in dextrose 5 % 50 mL IVPB     2 g 100 mL/hr over 30 Minutes Intravenous  Once 04/04/17 1220 04/04/17 2213        Note: Portions of this report may have been transcribed using voice recognition software. Every effort was made to ensure accuracy; however, inadvertent computerized transcription errors may be present.   Any transcriptional errors that result from this process are unintentional.     Adin Hector, M.D., F.A.C.S. Gastrointestinal and Minimally Invasive Surgery [facility] [address], [place], [place] [address] [phone] Main / Paging   04/12/2017

## 2017-04-13 DIAGNOSIS — A4102 Sepsis due to Methicillin resistant Staphylococcus aureus: Secondary | ICD-10-CM

## 2017-04-13 LAB — BASIC METABOLIC PANEL
Anion gap: 8 (ref 5–15)
BUN: 19 mg/dL (ref 6–20)
CALCIUM: 9.9 mg/dL (ref 8.9–10.3)
CHLORIDE: 94 mmol/L — AB (ref 101–111)
CO2: 33 mmol/L — ABNORMAL HIGH (ref 22–32)
CREATININE: 0.48 mg/dL (ref 0.44–1.00)
GFR calc Af Amer: >60 mL/min (ref >60)
GFR calc non Af Amer: >60 mL/min (ref >60)
Glucose, Bld: 143 mg/dL — ABNORMAL HIGH (ref 65–99)
Potassium: 4.2 mmol/L (ref 3.5–5.1)
SODIUM: 135 mmol/L (ref 135–145)

## 2017-04-13 LAB — URINALYSIS, ROUTINE W REFLEX MICROSCOPIC
BILIRUBIN URINE: NEGATIVE
Glucose, UA: NEGATIVE mg/dL
KETONES UR: NEGATIVE mg/dL
Nitrite: POSITIVE — AB
Protein, ur: 30 mg/dL — AB
SPECIFIC GRAVITY, URINE: 1.014 (ref 1.005–1.030)
Squamous Epithelial / LPF: NONE SEEN
pH: 8 (ref 5.0–8.0)

## 2017-04-13 LAB — GLUCOSE, CAPILLARY
GLUCOSE-CAPILLARY: 156 mg/dL — AB (ref 65–99)
Glucose-Capillary: 130 mg/dL — ABNORMAL HIGH (ref 65–99)
Glucose-Capillary: 166 mg/dL — ABNORMAL HIGH (ref 65–99)
Glucose-Capillary: 114 mg/dL — ABNORMAL HIGH (ref 65–99)

## 2017-04-13 LAB — CBC WITH DIFFERENTIAL/PLATELET
Basophils Absolute: 0 10*3/uL (ref 0.0–0.1)
Basophils Relative: 0 %
EOS ABS: 0 10*3/uL (ref 0.0–0.7)
EOS PCT: 0 %
HCT: 32.3 % — ABNORMAL LOW (ref 36.0–46.0)
HEMOGLOBIN: 9.9 g/dL — AB (ref 12.0–15.0)
LYMPHS ABS: 3.5 10*3/uL (ref 0.7–4.0)
Lymphocytes Relative: 22 %
MCH: 23.7 pg — AB (ref 26.0–34.0)
MCHC: 30.7 g/dL (ref 30.0–36.0)
MCV: 77.5 fL — ABNORMAL LOW (ref 78.0–100.0)
MONO ABS: 0.9 10*3/uL (ref 0.1–1.0)
MONOS PCT: 5 %
NEUTROS PCT: 73 %
Neutro Abs: 11.9 10*3/uL — ABNORMAL HIGH (ref 1.7–7.7)
Platelets: 403 10*3/uL — ABNORMAL HIGH (ref 150–400)
RBC: 4.17 MIL/uL (ref 3.87–5.11)
RDW: 16.9 % — AB (ref 11.5–15.5)
WBC: 16.3 10*3/uL — ABNORMAL HIGH (ref 4.0–10.5)

## 2017-04-13 MED ORDER — FAMOTIDINE 20 MG PO TABS
20.0000 mg | ORAL_TABLET | Freq: Two times a day (BID) | ORAL | Status: DC
  Administered 2017-04-13 – 2017-04-16 (×7): 20 mg via ORAL
  Filled 2017-04-13 (×7): qty 1

## 2017-04-13 MED ORDER — MIDODRINE HCL 5 MG PO TABS
2.5000 mg | ORAL_TABLET | Freq: Two times a day (BID) | ORAL | Status: DC
  Administered 2017-04-13 – 2017-04-15 (×4): 2.5 mg via ORAL
  Filled 2017-04-13 (×4): qty 1

## 2017-04-13 NOTE — Progress Notes (Addendum)
PROGRESS NOTE    Sharon Warner  OVF:[id number] DOB: May 06, 1951 DOA: 04/04/2017 PCP: [name], MD    Brief Narrative:  66 y.o. female with Past medical history of paraplegia, neurogenic bladder, wheelchair-bound, chronic indwelling Foley catheter, sacral decubitus ulcer, morbid obesity, HTN, type II DM, sacral osteomyelitis, chronic diastolic CHF. Patient presented with complains of fever. In her usual health yesterday. This morning when she woke up and the home health nurse came to see her she had a temp of 101.4 and she was asked to go to ER. Denies having any complaints of chills, nausea, vomiting, abdominal pain, chest pain, headache, dizziness. No cough. No recent change in medications. Her Foley catheter is past due for a change for 2 days. She has a colostomy bag which was last emptied on Wednesday. Denies any recent change in medication other than decreasing Lopressor which was done almost a month ago.  ED Course: Presented with fever, was febrile with tachycardia, UA was showing pyuria and patient was started on antibiotics and was referred for admission  Assessment & Plan:   Principal Problem:   Streptococcal bacteremia Active Problems:   Morbid obesity ([place])   Type 2 diabetes mellitus (HCC)   HTN (hypertension)   Paraplegia (HCC)   Neurogenic bladder   Obstructive sleep apnea   Microcytic anemia   Protein-calorie malnutrition (HCC)   Multinodular goiter   Hypercalcemia   Sacral decubitus ulcer, stage IV (HCC)   Acute on chronic combined systolic and diastolic CHF (congestive heart failure) (HCC)   Sacroiliitis (HCC)   Gram-negative bacteremia  sepsis present on admission [facility]), bacteremia -Pt presents with fever, leukocytosis, sinus tachycardia and evidence of pyuria/( urine was cloudy with sediment initial on presentation). uti thought was the cause of sepsis and bacteremia, foley cath changed at time of admission -however urine culture no growth, blood  culture +pos for proteus and strep species which is consistent with culture from Last year in June 2017 when patient had polymicrobial bacteremia as well as sacral osteomyelitis and was treated with ceftriaxonex6weeks. -I have discussed the case with infectious disease Dr Linus Salmons on 7/13 who recommend CT pel and general surgery consult for possible wound debridment -Ct pel with possible left si joint septic arthritis with fluids collection 4cmx2cm anterior to the joint, I have discussed with oncall orthopedic surgery Dr Raphael Gibney on 7/14 who recommended IR and formal ID consult, per ortho should patient need ortho surgery in the future for SI Joint, patient need to be referred to academic center. -I discussed with case with IR Dr Annamaria Boots who do not feel aspiration will change management, he recommended infectious disease consult. -general surgery and wound care consulted for wound care, start hydrotherapy per general surgery recommendations. -infectious disease Dr Megan Salon consulted, after discussion with patient about long term iv abx treatment vs trial of prolonged course of oral abx therapy , patient decided to try oral therapy, abx changed to keflex and flagyl from 7/14, please see id consult note from 7/14 for details -will need LTAC /SNF for hydrotherapy  Leukocytosis:wbc start to rise from 7/14 From over diuresis? From infection?  Repeat ua and blood culture, lactic acid   Sinus tachycardia/bradycardia. -Pt presented with sinus tachycardia in the range of 130s and 140s iniitally on admission -Likely sequela of sepsis versus rebound tachycardia after holding beta blocker secondary to hypertension -Tachycardia resolved with resumption of beta blocker, however , patient noted to be transiently profoundly bradycardic/sinus arrest  -keep mag>2, k >4, tsh 2.1, - cardiology consulted,  now off betablocker , patient is deemed to be a poor candidate for pacemaker, meds adjustment per cardiology  acute  on Chronic combined systolic and diastolic heart failure, acute hypoxic respiratory failure. --Patient had been on torsemide prior to hospital admission. -she received fluids resuscitation for sepsis initially, cxr with findings of developing pulmonary edema -echo with reduced ef 25-30% - cardiology consulted,  Currently off betablocker, arb, on diuretics only. meds adjustment per cardiology. Wean oxygen. -case discussed with cardiology Dr Oval Linsey   Hypokalemia/hypomagnesemia: replace k/mag prn , keep K.4, mag>2   Hypercalcemia. -ca 11.3 on admission -Etiology remains unclear. -Home vitamin D supplementation on hold -Calcium levels have normalized  Type 2 diabetes mellitus. -On sensitive sliding scale. -Not on diabetic medications prior to admission -Labs reviewed. Currently stable   OSA. -Continue CPAP Daily at bedtime. -Currently stable  Hoarse voice -Unclear etiology -Pt denies coughing, no sore throat, no n/v -Question laryngitis  --soft tissue neck CT no acute findings, rapid strep test negative -improving  Bedbound,  paraplegia,  Chronic stage 4 pressure injury to sacrum presented on admission, s/p colostomy and indwelling foley (get changed Q month) Wound care consulted  DVT prophylaxis: Lovenox subcutaneous (wonder if patient need chronic lovenox due to baseline bedbound status) Code Status: Full code Family Communication: Patient in room, husband in room Disposition Plan: remain in stepdown due to tachy/brady Per husband, they relocated from [place] to [place] since 02/2016, the second day they arrived to [place], patient end up admitted to [facility] long due to sepsis/bacteremia, patient has since been in different LTAC and SNF, she has not been able to stay home for more than a month before she was brought back to [place] long and admitted with sepsis on 7/6  Will need LTAC /SNF at discharge this time due to the need of hydrotherapy.  Consultants:    Cardiology  Infectious disease   General surgery  Interventional radiology Dr Annamaria Boots over the phone on 7/14  Orthopedics surgery Dr Raphael Gibney over the phone on 7/14  Wound care  Procedures:   none  Antimicrobials: Anti-infectives    Start     Dose/Rate Route Frequency Ordered Stop   04/12/17 1400  cephALEXin (KEFLEX) capsule 500 mg     500 mg Oral Every 8 hours 04/12/17 1015     04/12/17 1400  metroNIDAZOLE (FLAGYL) tablet 500 mg     500 mg Oral Every 8 hours 04/12/17 1015     04/06/17 1400  cefTRIAXone (ROCEPHIN) 2 g in dextrose 5 % 50 mL IVPB  Status:  Discontinued     2 g 100 mL/hr over 30 Minutes Intravenous Every 24 hours 04/05/17 1417 04/12/17 1015   04/05/17 1400  cefTRIAXone (ROCEPHIN) 1 g in dextrose 5 % 50 mL IVPB  Status:  Discontinued     1 g 100 mL/hr over 30 Minutes Intravenous Every 24 hours 04/04/17 1235 04/05/17 1417   04/04/17 1230  cefTRIAXone (ROCEPHIN) 2 g in dextrose 5 % 50 mL IVPB     2 g 100 mL/hr over 30 Minutes Intravenous  Once 04/04/17 1220 04/04/17 2213      Subjective:   urine output 950cc last 24hrs No fever, tmax 100, wbc increasing Intermittent sinus tachycardia, no bradycardia last 24hrs Report hard stool in colostomy bag  Husband at bedside   Objective: Vitals:   04/13/17 0400 04/13/17 0705 04/13/17 0800 04/13/17 1200  BP: (!) 105/58  (!) 83/50 (!) 119/48  Pulse: 91  (!) 112 (!) 106  Resp: Marland Kitchen)  27  (!) 34 (!) 33  Temp:  99.7 F (37.6 C)    TempSrc:  Oral    SpO2: 95%  95% 94%  Weight:      Height:        Intake/Output Summary (Last 24 hours) at 04/13/17 1604 Last data filed at 04/13/17 1400  Gross per 24 hour  Intake             1014 ml  Output             1900 ml  Net             -886 ml   Filed Weights   04/10/17 0437 04/11/17 0456 04/12/17 0343  Weight: 75.6 kg (166 lb 10.7 oz) 75.4 kg (166 lb 3.6 oz) 75.2 kg (165 lb 12.6 oz)    Examination: General exam: chronically ill, Awake, laying in bed, in nad,  hoarse voice has improved Respiratory system: decreased breath sounds at bilateral base,  no wheezing, no rales, no rhonchi Cardiovascular system: regular rate, s1, s2 Gastrointestinal system: Soft, nondistended, positive BS, + colostomy bag with hard stool, indwelling foley Central nervous system: chronic paraplegia, no sensation from waist down,  Extremities:+bilateral heal protection , no edema Skin: Chronic stage 4 pressure injury to sacrum presented on admission,  Psychiatry: Mood normal // no visual hallucinations    Data Reviewed: I have personally reviewed following labs and imaging studies  CBC:  Recent Labs Lab 04/07/17 0310 04/08/17 0318 04/09/17 0301 04/12/17 0334 04/13/17 0330  WBC 8.0 10.4 9.5 15.3* 16.3*  NEUTROABS  --   --   --  10.5* 11.9*  HGB 9.1* 9.7* 9.8* 11.1* 9.9*  HCT 29.3* 31.5* 31.5* 36.4 32.3*  MCV 75.3* 77.0* 77.6* 77.6* 77.5*  PLT 254 285 311 456* 161*   Basic Metabolic Panel:  Recent Labs Lab 04/08/17 0318 04/09/17 0301 04/10/17 0316 04/11/17 0254 04/12/17 0334 04/13/17 0330  NA 137 139 142 137 136 135  K 3.6 3.5 3.4* 3.5 4.3 4.2  CL 110 105 103 98* 93* 94*  CO2 20* 25 30 31 31  33*  GLUCOSE 151* 126* 125* 122* 133* 143*  BUN 20 22* 24* 21* 22* 19  CREATININE 0.52 0.58 0.66 0.53 0.56 0.48  CALCIUM 9.9 10.0 9.8 9.7 9.8 9.9  MG 1.8  --  2.2  --  2.1  --    GFR: Estimated Creatinine Clearance: 65.9 mL/min (by C-G formula based on SCr of 0.48 mg/dL). Liver Function Tests: No results for input(s): AST, ALT, ALKPHOS, BILITOT, PROT, ALBUMIN in the last 168 hours. No results for input(s): LIPASE, AMYLASE in the last 168 hours. No results for input(s): AMMONIA in the last 168 hours. Coagulation Profile: No results for input(s): INR, PROTIME in the last 168 hours. Cardiac Enzymes: No results for input(s): CKTOTAL, CKMB, CKMBINDEX, TROPONINI in the last 168 hours. BNP (last 3 results) No results for input(s): PROBNP in the last 8760  hours. HbA1C:  Recent Labs  04/11/17 0254  HGBA1C 5.1   CBG:  Recent Labs Lab 04/12/17 1725 04/12/17 1929 04/12/17 2246 04/13/17 0822 04/13/17 1321  GLUCAP 129* 137* 160* 156* 130*   Lipid Profile: No results for input(s): CHOL, HDL, LDLCALC, TRIG, CHOLHDL, LDLDIRECT in the last 72 hours. Thyroid Function Tests: No results for input(s): TSH, T4TOTAL, FREET4, T3FREE, THYROIDAB in the last 72 hours. Anemia Panel: No results for input(s): VITAMINB12, FOLATE, FERRITIN, TIBC, IRON, RETICCTPCT in the last 72 hours. Sepsis Labs:  Recent Labs Lab  04/07/17 0310 04/09/17 0301  PROCALCITON 1.55 1.36    Recent Results (from the past 240 hour(s))  Culture, blood (Routine x 2)     Status: Abnormal   Collection Time: 04/04/17 12:42 PM  Result Value Ref Range Status   Specimen Description BLOOD BLOOD RIGHT ARM  Final   Special Requests   Final    BOTTLES DRAWN AEROBIC AND ANAEROBIC Blood Culture adequate volume   Culture  Setup Time   Final    GRAM POSITIVE COCCI IN PAIRS IN CHAINS GRAM NEGATIVE RODS AEROBIC BOTTLE ONLY CRITICAL RESULT CALLED TO, READ BACK BY AND VERIFIED WITH: A PHAM,PHARMD AT 1437 04/05/17 BY L BENFIELD Performed at [facility], [place] [address] [place], [place] [address]    Culture (A)  Final    PROTEUS MIRABILIS GROUP B STREP(S.AGALACTIAE)ISOLATED    Report Status 04/09/2017 FINAL  Final   Organism ID, Bacteria PROTEUS MIRABILIS  Final   Organism ID, Bacteria GROUP B STREP(S.AGALACTIAE)ISOLATED  Final      Susceptibility   Group b strep(s.agalactiae)isolated - MIC*    CLINDAMYCIN >=1 RESISTANT Resistant     AMPICILLIN <=0.25 SENSITIVE Sensitive     ERYTHROMYCIN >=8 RESISTANT Resistant     VANCOMYCIN 0.5 SENSITIVE Sensitive     CEFTRIAXONE <=0.12 SENSITIVE Sensitive     LEVOFLOXACIN 1 SENSITIVE Sensitive     * GROUP B STREP(S.AGALACTIAE)ISOLATED   Proteus mirabilis - MIC*    AMPICILLIN <=2 SENSITIVE Sensitive     CEFAZOLIN 8 SENSITIVE  Sensitive     CEFEPIME <=1 SENSITIVE Sensitive     CEFTAZIDIME <=1 SENSITIVE Sensitive     CEFTRIAXONE <=1 SENSITIVE Sensitive     CIPROFLOXACIN 0.5 SENSITIVE Sensitive     GENTAMICIN <=1 SENSITIVE Sensitive     IMIPENEM 4 SENSITIVE Sensitive     TRIMETH/SULFA <=20 SENSITIVE Sensitive     AMPICILLIN/SULBACTAM <=2 SENSITIVE Sensitive     PIP/TAZO <=4 SENSITIVE Sensitive     * PROTEUS MIRABILIS  Blood Culture ID Panel (Reflexed)     Status: Abnormal   Collection Time: 04/04/17 12:42 PM  Result Value Ref Range Status   Enterococcus species NOT DETECTED NOT DETECTED Final   Listeria monocytogenes NOT DETECTED NOT DETECTED Final   Staphylococcus species NOT DETECTED NOT DETECTED Final   Staphylococcus aureus NOT DETECTED NOT DETECTED Final   Streptococcus species DETECTED (A) NOT DETECTED Final    Comment: CRITICAL RESULT CALLED TO, READ BACK BY AND VERIFIED WITH: A PHAM,PHARMD AT 1437 04/05/17 BY L BENFIELD    Streptococcus agalactiae DETECTED (A) NOT DETECTED Final    Comment: CRITICAL RESULT CALLED TO, READ BACK BY AND VERIFIED WITH: A PHAM,PHARMD AT 1437 04/05/17 BY L BENFIELD    Streptococcus pneumoniae NOT DETECTED NOT DETECTED Final   Streptococcus pyogenes NOT DETECTED NOT DETECTED Final   Acinetobacter baumannii NOT DETECTED NOT DETECTED Final   Enterobacteriaceae species DETECTED (A) NOT DETECTED Final    Comment: Enterobacteriaceae represent a large family of gram-negative bacteria, not a single organism. CRITICAL RESULT CALLED TO, READ BACK BY AND VERIFIED WITH: A PHAM,PHARMD AT 1437 04/05/17 BY L BENFIELD    Enterobacter cloacae complex NOT DETECTED NOT DETECTED Final   Escherichia coli NOT DETECTED NOT DETECTED Final   Klebsiella oxytoca NOT DETECTED NOT DETECTED Final   Klebsiella pneumoniae NOT DETECTED NOT DETECTED Final   Proteus species DETECTED (A) NOT DETECTED Final    Comment: CRITICAL RESULT CALLED TO, READ BACK BY AND VERIFIED WITH: A PHAM,PHARMD AT 1437  04/05/17 BY  L BENFIELD    Serratia marcescens NOT DETECTED NOT DETECTED Final   Carbapenem resistance NOT DETECTED NOT DETECTED Final   Haemophilus influenzae NOT DETECTED NOT DETECTED Final   Neisseria meningitidis NOT DETECTED NOT DETECTED Final   Pseudomonas aeruginosa NOT DETECTED NOT DETECTED Final   Candida albicans NOT DETECTED NOT DETECTED Final   Candida glabrata NOT DETECTED NOT DETECTED Final   Candida krusei NOT DETECTED NOT DETECTED Final   Candida parapsilosis NOT DETECTED NOT DETECTED Final   Candida tropicalis NOT DETECTED NOT DETECTED Final    Comment: Performed at [facility], [place] [address] [place], [place] [address]  Urine culture     Status: Abnormal   Collection Time: 04/04/17  3:43 PM  Result Value Ref Range Status   Specimen Description URINE, CATHETERIZED  Final   Special Requests NONE  Final   Culture MULTIPLE ORGANISMS PRESENT, NONE PREDOMINANT (A)  Final   Report Status 04/05/2017 FINAL  Final  Culture, blood (Routine x 2)     Status: None   Collection Time: 04/04/17  6:54 PM  Result Value Ref Range Status   Specimen Description BLOOD LEFT ANTECUBITAL  Final   Special Requests IN PEDIATRIC BOTTLE Blood Culture adequate volume  Final   Culture   Final    NO GROWTH 5 DAYS Performed at [facility], [place] [address], [place], [place] [address]    Report Status 04/09/2017 FINAL  Final  MRSA PCR Screening     Status: None   Collection Time: 04/04/17  6:58 PM  Result Value Ref Range Status   MRSA by PCR NEGATIVE NEGATIVE Final    Comment:        The GeneXpert MRSA Assay (FDA approved for NASAL specimens only), is one component of a comprehensive MRSA colonization surveillance program. It is not intended to diagnose MRSA infection nor to guide or monitor treatment for MRSA infections.   Rapid strep screen (not at [facility])     Status: None   Collection Time: 04/09/17  9:18 AM  Result Value Ref Range Status   Streptococcus, Group A Screen (Direct)  NEGATIVE NEGATIVE Final    Comment: (NOTE) A Rapid Antigen test may result negative if the antigen level in the sample is below the detection level of this test. The FDA has not cleared this test as a stand-alone test therefore the rapid antigen negative result has reflexed to a Group A Strep culture.   Culture, group A strep     Status: None   Collection Time: 04/09/17  9:18 AM  Result Value Ref Range Status   Specimen Description THROAT  Final   Special Requests NONE Reflexed from [id number]  Final   Culture   Final    NO GROUP A STREP (S.PYOGENES) ISOLATED Performed at [facility], [address] [place], [place] [address]    Report Status 04/11/2017 FINAL  Final  Culture, blood (routine x 2)     Status: None (Preliminary result)   Collection Time: 04/09/17  9:46 AM  Result Value Ref Range Status   Specimen Description BLOOD LEFT ARM  Final   Special Requests IN PEDIATRIC BOTTLE Blood Culture adequate volume  Final   Culture   Final    NO GROWTH 4 DAYS Performed at [facility], [place] [address] [place],  [address]    Report Status PENDING  Incomplete  Culture, blood (routine x 2)     Status: Abnormal  Collection Time: 04/09/17  9:46 AM  Result Value Ref Range Status   Specimen Description BLOOD RIGHT ARM  Final   Special Requests IN PEDIATRIC BOTTLE Blood Culture adequate volume  Final   Culture  Setup Time   Final    GRAM POSITIVE COCCI IN CLUSTERS IN PEDIATRIC BOTTLE CRITICAL RESULT CALLED TO, READ BACK BY AND VERIFIED WITH: A. Pham Pharm.D. 12:10 04/10/17 ([email or web address])    Culture (A)  Final    STAPHYLOCOCCUS SPECIES (COAGULASE NEGATIVE) THE SIGNIFICANCE OF ISOLATING THIS ORGANISM FROM A SINGLE SET OF BLOOD CULTURES WHEN MULTIPLE SETS ARE DRAWN IS UNCERTAIN. PLEASE NOTIFY THE MICROBIOLOGY DEPARTMENT WITHIN ONE WEEK IF SPECIATION AND SENSITIVITIES ARE REQUIRED. Performed at [facility], [place] [address] [place], [place] [address]    Report Status  04/12/2017 FINAL  Final  Blood Culture ID Panel (Reflexed)     Status: Abnormal   Collection Time: 04/09/17  9:46 AM  Result Value Ref Range Status   Enterococcus species NOT DETECTED NOT DETECTED Final   Listeria monocytogenes NOT DETECTED NOT DETECTED Final   Staphylococcus species DETECTED (A) NOT DETECTED Final    Comment: Methicillin (oxacillin) resistant coagulase negative staphylococcus. Possible blood culture contaminant (unless isolated from more than one blood culture draw or clinical case suggests pathogenicity). No antibiotic treatment is indicated for blood  culture contaminants. CRITICAL RESULT CALLED TO, READ BACK BY AND VERIFIED WITH: A. Pham Pharm.D. 12:10 04/10/17 ([email or web address])    Staphylococcus aureus NOT DETECTED NOT DETECTED Final   Methicillin resistance DETECTED (A) NOT DETECTED Final    Comment: CRITICAL RESULT CALLED TO, READ BACK BY AND VERIFIED WITH: A. Pham Pharm.D. 12:10 04/10/17 ([email or web address])    Streptococcus species NOT DETECTED NOT DETECTED Final   Streptococcus agalactiae NOT DETECTED NOT DETECTED Final   Streptococcus pneumoniae NOT DETECTED NOT DETECTED Final   Streptococcus pyogenes NOT DETECTED NOT DETECTED Final   Acinetobacter baumannii NOT DETECTED NOT DETECTED Final   Enterobacteriaceae species NOT DETECTED NOT DETECTED Final   Enterobacter cloacae complex NOT DETECTED NOT DETECTED Final   Escherichia coli NOT DETECTED NOT DETECTED Final   Klebsiella oxytoca NOT DETECTED NOT DETECTED Final   Klebsiella pneumoniae NOT DETECTED NOT DETECTED Final   Proteus species NOT DETECTED NOT DETECTED Final   Serratia marcescens NOT DETECTED NOT DETECTED Final   Haemophilus influenzae NOT DETECTED NOT DETECTED Final   Neisseria meningitidis NOT DETECTED NOT DETECTED Final   Pseudomonas aeruginosa NOT DETECTED NOT DETECTED Final   Candida albicans NOT DETECTED NOT DETECTED Final   Candida glabrata NOT DETECTED NOT DETECTED Final   Candida krusei NOT DETECTED NOT  DETECTED Final   Candida parapsilosis NOT DETECTED NOT DETECTED Final   Candida tropicalis NOT DETECTED NOT DETECTED Final    Comment: Performed at [facility], [place] [address], [place], [place] [address]     Radiology Studies: Ct Pelvis W Contrast  Addendum Date: 04/11/2017   ADDENDUM REPORT: 04/11/2017 19:47 ADDENDUM: Additional finding to musculoskeletal section: Bilateral intramedullary femoral rods across old left distal shaft fracture. There is somewhat posterior peripheral positioning of the right femoral rod. There is a detached fixating screw within the medial, posterior tissues of the upper thigh/gluteal region. Electronically Signed   By: Donavan Foil M.D.   On: 04/11/2017 19:47   Addendum Date: 04/11/2017   ADDENDUM REPORT: 04/11/2017 19:28 ADDENDUM: Left external iliac and inguinal adenopathy likely reactive Electronically Signed   By: Donavan Foil M.D.   On: 04/11/2017 19:28  Result Date: 04/11/2017 CLINICAL DATA:  Sacral decubitus ulcer, fever EXAM: CT PELVIS WITH CONTRAST TECHNIQUE: Multidetector CT imaging of the pelvis was performed using the standard protocol following the bolus administration of intravenous contrast. CONTRAST:  152mL ISOVUE-300 IOPAMIDOL (ISOVUE-300) INJECTION 61% COMPARISON:  03/15/2016, 03/13/2016 FINDINGS: Urinary Tract: Foley catheter in the bladder with small air present in the bladder. Very thick-walled appearance of the bladder. Low lying left kidney containing multiple stones, measuring up to 6 mm in the lower pole. No obvious hydronephrosis. Bowel: Mild rectum thickening. Left lower quadrant colostomy with large amount of stool. Vascular/Lymphatic: Aortic atherosclerosis. subcentimeter periaortic lymph nodes. Subcentimeter pelvic sidewall lymph nodes. Enlarged left external iliac nodes measuring up to 2.1 cm. Multiple enlarged left inguinal lymph nodes measuring up to 2.2 cm in short axis. Reproductive:  Status post hysterectomy.  No adnexal masses.  Other: Diffuse subcutaneous edema. Mild very rectal soft tissue stranding and small amount of fluid in the pelvis. Musculoskeletal: Large, deep sacral decubitus ulcer, measuring at least 6.7 cm transverse. Underlying bony sclerosis and erosive changes consistent with chronic osteomyelitis. Soft tissue thickening around the left posterior SI joint with bony erosive changes present. Fluid and gas collection anterior to the left SI joint and abutting or involving the left iliacus muscle, consistent with abscess. This measures 2.5 by 2.1 by 4 cm. IMPRESSION: 1. Deep sacral decubitus ulcer down to the bone with underlying sclerosis and extensive erosive changes, consistent with osteomyelitis. More acute appearing soft tissue and erosive change involving the left posterior SI joint. There is gas and mildly rim enhancing fluid collection anterior to the left SI joint which appears contiguous with the joint space, this measures 4 cm cranial caudad by 2.1 cm transverse. The collective findings are concerning for left septic arthritis of the sacroiliac joint with associated abscess anterior to the joint. 2. Low lying left kidney with multiple stones. Thick-walled urinary bladder with Foley catheter could be due to cystitis 3. Mild rectal wall thickening, query proctitis. Electronically Signed: By: Donavan Foil M.D. On: 04/11/2017 19:00   Dg Chest Port 1 View  Result Date: 04/12/2017 CLINICAL DATA:  Shortness of breath today EXAM: PORTABLE CHEST 1 VIEW COMPARISON:  04/07/2017 FINDINGS: Cardiomegaly with vascular congestion. Left lower lobe atelectasis or infiltrate is similar to prior study. Probable small left effusion. No overt edema. No acute bony abnormality. IMPRESSION: Small left effusion with left lower lobe atelectasis or infiltrate. Cardiomegaly. Electronically Signed   By: Rolm Baptise M.D.   On: 04/12/2017 09:15    Scheduled Meds: . acidophilus  2 capsule Oral TID  . aspirin EC  81 mg Oral Daily  .  cephALEXin  500 mg Oral Q8H  . enoxaparin (LOVENOX) injection  40 mg Subcutaneous Q24H  . famotidine  20 mg Oral BID  . feeding supplement (ENSURE ENLIVE)  237 mL Oral BID BM  . fluticasone  2 spray Each Nare Daily  . insulin aspart  0-15 Units Subcutaneous TID WC  . insulin aspart  0-5 Units Subcutaneous QHS  . mouth rinse  15 mL Mouth Rinse BID  . metroNIDAZOLE  500 mg Oral Q8H  . multivitamin with minerals  1 tablet Oral Daily  . polyethylene glycol  17 g Oral Daily  . potassium chloride  10 mEq Oral Daily  . senna-docusate  1 tablet Oral BID  . sodium chloride flush  3 mL Intravenous Q12H  . sodium hypochlorite   Irrigation BID   Continuous Infusions:    LOS: 9 days  Time spent >59mins  Tvisha Schwoerer, MD PhD [facility] Pager [phone]  If 7PM-7AM, please contact night-coverage [email or web address] Password [facility] 04/13/2017, 4:04 PM

## 2017-04-13 NOTE — Progress Notes (Addendum)
Progress Note  Patient Name: Sharon Warner Date of Encounter: 04/13/2017  Primary Cardiologist: new ([place])  Subjective   Feeling well.  Denies chest pain or shortness of breath.  Denies pain  Inpatient Medications    Scheduled Meds: . acidophilus  2 capsule Oral TID  . aspirin EC  81 mg Oral Daily  . cephALEXin  500 mg Oral Q8H  . enoxaparin (LOVENOX) injection  40 mg Subcutaneous Q24H  . feeding supplement (ENSURE ENLIVE)  237 mL Oral BID BM  . fluticasone  2 spray Each Nare Daily  . insulin aspart  0-15 Units Subcutaneous TID WC  . insulin aspart  0-5 Units Subcutaneous QHS  . mouth rinse  15 mL Mouth Rinse BID  . metroNIDAZOLE  500 mg Oral Q8H  . multivitamin with minerals  1 tablet Oral Daily  . polyethylene glycol  17 g Oral Daily  . potassium chloride  10 mEq Oral Daily  . senna-docusate  1 tablet Oral BID  . sodium chloride flush  3 mL Intravenous Q12H  . sodium hypochlorite   Irrigation BID  . spironolactone  25 mg Oral Daily  . torsemide  10 mg Oral Daily   Continuous Infusions:  PRN Meds: acetaminophen **OR** acetaminophen, iopamidol, metoprolol tartrate, naproxen sodium, ondansetron **OR** ondansetron (ZOFRAN) IV, traMADol-acetaminophen   Vital Signs    Vitals:   04/13/17 0000 04/13/17 0329 04/13/17 0400 04/13/17 0705  BP: (!) 95/51  (!) 105/58   Pulse: (!) 108  91   Resp: (!) 30  (!) 27   Temp:  98.4 F (36.9 C)  99.7 F (37.6 C)  TempSrc:  Oral  Oral  SpO2: 93%  95%   Weight:      Height:        Intake/Output Summary (Last 24 hours) at 04/13/17 0758 Last data filed at 04/13/17 0700  Gross per 24 hour  Intake              280 ml  Output              950 ml  Net             -670 ml   Filed Weights   04/10/17 0437 04/11/17 0456 04/12/17 0343  Weight: 75.6 kg (166 lb 10.7 oz) 75.4 kg (166 lb 3.6 oz) 75.2 kg (165 lb 12.6 oz)    Telemetry    Sinus tachycardia, sinus rhythm.  No events.  - Personally Reviewed  ECG    n/a - Personally  Reviewed  Physical Exam   GEN: Chronically ill-appearing.  No acute distress.   HEENT: Face less erythematous.  Voice intermittently horse. Neck: No JVD Cardiac: Tachycardic.  Regular rhythm.  No murmurs, rubs, or gallops.  Respiratory: Clear to auscultation bilaterally on anterior exam. GI: Soft, nontender, non-distended  MS: Warm.  No edema; No deformity. Neuro:  Nonfocal  Psych: Normal affect   Labs    Chemistry  Recent Labs Lab 04/11/17 0254 04/12/17 0334 04/13/17 0330  NA 137 136 135  K 3.5 4.3 4.2  CL 98* 93* 94*  CO2 31 31 33*  GLUCOSE 122* 133* 143*  BUN 21* 22* 19  CREATININE 0.53 0.56 0.48  CALCIUM 9.7 9.8 9.9  GFRNONAA >60 >60 >60  GFRAA >60 >60 >60  ANIONGAP 8 12 8      Hematology  Recent Labs Lab 04/09/17 0301 04/12/17 0334 04/13/17 0330  WBC 9.5 15.3* 16.3*  RBC 4.06 4.69 4.17  HGB 9.8* 11.1* 9.9*  HCT 31.5* 36.4 32.3*  MCV 77.6* 77.6* 77.5*  MCH 24.1* 23.7* 23.7*  MCHC 31.1 30.5 30.7  RDW 16.9* 16.8* 16.9*  PLT 311 456* 403*    Cardiac EnzymesNo results for input(s): TROPONINI in the last 168 hours. No results for input(s): TROPIPOC in the last 168 hours.   BNP  Recent Labs Lab 04/12/17 0334  BNP 1,317.3*     DDimer No results for input(s): DDIMER in the last 168 hours.   Radiology    Ct Pelvis W Contrast  Addendum Date: 04/11/2017   ADDENDUM REPORT: 04/11/2017 19:47 ADDENDUM: Additional finding to musculoskeletal section: Bilateral intramedullary femoral rods across old left distal shaft fracture. There is somewhat posterior peripheral positioning of the right femoral rod. There is a detached fixating screw within the medial, posterior tissues of the upper thigh/gluteal region. Electronically Signed   By: Donavan Foil M.D.   On: 04/11/2017 19:47   Addendum Date: 04/11/2017   ADDENDUM REPORT: 04/11/2017 19:28 ADDENDUM: Left external iliac and inguinal adenopathy likely reactive Electronically Signed   By: Donavan Foil M.D.   On:  04/11/2017 19:28   Result Date: 04/11/2017 CLINICAL DATA:  Sacral decubitus ulcer, fever EXAM: CT PELVIS WITH CONTRAST TECHNIQUE: Multidetector CT imaging of the pelvis was performed using the standard protocol following the bolus administration of intravenous contrast. CONTRAST:  119mL ISOVUE-300 IOPAMIDOL (ISOVUE-300) INJECTION 61% COMPARISON:  03/15/2016, 03/13/2016 FINDINGS: Urinary Tract: Foley catheter in the bladder with small air present in the bladder. Very thick-walled appearance of the bladder. Low lying left kidney containing multiple stones, measuring up to 6 mm in the lower pole. No obvious hydronephrosis. Bowel: Mild rectum thickening. Left lower quadrant colostomy with large amount of stool. Vascular/Lymphatic: Aortic atherosclerosis. subcentimeter periaortic lymph nodes. Subcentimeter pelvic sidewall lymph nodes. Enlarged left external iliac nodes measuring up to 2.1 cm. Multiple enlarged left inguinal lymph nodes measuring up to 2.2 cm in short axis. Reproductive:  Status post hysterectomy.  No adnexal masses. Other: Diffuse subcutaneous edema. Mild very rectal soft tissue stranding and small amount of fluid in the pelvis. Musculoskeletal: Large, deep sacral decubitus ulcer, measuring at least 6.7 cm transverse. Underlying bony sclerosis and erosive changes consistent with chronic osteomyelitis. Soft tissue thickening around the left posterior SI joint with bony erosive changes present. Fluid and gas collection anterior to the left SI joint and abutting or involving the left iliacus muscle, consistent with abscess. This measures 2.5 by 2.1 by 4 cm. IMPRESSION: 1. Deep sacral decubitus ulcer down to the bone with underlying sclerosis and extensive erosive changes, consistent with osteomyelitis. More acute appearing soft tissue and erosive change involving the left posterior SI joint. There is gas and mildly rim enhancing fluid collection anterior to the left SI joint which appears contiguous with  the joint space, this measures 4 cm cranial caudad by 2.1 cm transverse. The collective findings are concerning for left septic arthritis of the sacroiliac joint with associated abscess anterior to the joint. 2. Low lying left kidney with multiple stones. Thick-walled urinary bladder with Foley catheter could be due to cystitis 3. Mild rectal wall thickening, query proctitis. Electronically Signed: By: Donavan Foil M.D. On: 04/11/2017 19:00   Dg Chest Port 1 View  Result Date: 04/12/2017 CLINICAL DATA:  Shortness of breath today EXAM: PORTABLE CHEST 1 VIEW COMPARISON:  04/07/2017 FINDINGS: Cardiomegaly with vascular congestion. Left lower lobe atelectasis or infiltrate is similar to prior study. Probable small left effusion. No overt edema. No acute bony abnormality. IMPRESSION: Small  left effusion with left lower lobe atelectasis or infiltrate. Cardiomegaly. Electronically Signed   By: Rolm Baptise M.D.   On: 04/12/2017 09:15    Cardiac Studies   Echo 04/09/17: Study Conclusions  - Left ventricle: Septal and apical akinesis. Inferior and distal   anterior wall hypokinesis. The cavity size was severely dilated.   Wall thickness was increased in a pattern of moderate LVH.   Systolic function was severely reduced. The estimated ejection   fraction was in the range of 25% to 30%. - Aortic valve: There was trivial regurgitation. - Left atrium: The atrium was mildly dilated. - Atrial septum: No defect or patent foramen ovale was identified. - Pulmonary arteries: PA peak pressure: 33 mm Hg (S). - Pericardium, extracardiac: Small but nearly circumferential   pericardial effusion no tamponade IVC small.   Patient Profile     66 y.o. female with chronic systolic and diastolic heart failure, hypertension, paraplegia, neurogenic bladder, sacral decubits ulcer, morbid obesity, and colostomy bag here with sepsis who developed acute on chronic heart failure and bradycardia.   Assessment & Plan    #  Acute on chronic systolic and diastolic heart failure:  Ms. Oelkers reports a long-standing history of heart failure and knew that her LVEF was low prior to admission.  She didn't have regular cardiology follow up.  She received IV fluid resuscitation for sepsis and then developed pulmonary edema.  CXR shows mild effusion and vascular congestion but no edema.  Her IVC was not dilated on echo.  She is warm and dry on exam, indicating that heart failure is not likely to be the cause of her hypotension and tachycardia.  I suspect this is more related to sepsis, especially given her rising WBC.  New antibiotics were started 7/14. Spironolactone was started 7/14. However, would hold this and torsemide today.  Consider restarting tomorrow if HR and BP have improved. She is not on a beta blocker 2/2 bradycardia and hypotension.  Checking co-ox and CVP would be helpful.  However, her last blood cultures 7/11 were positive for MRSA, so replacing a PICC is not ideal at this time.  If a pressor is needed, would start with levophed.  Repeat cultures are pending for today.  Will continue to follow.  # Bradycardia:  Ms. Mannings initially had a 14 second pause on 7/10 while swallowing pills.  She was also noted to be bradycardic with heart rates in the 30s-40s.  Metoprolol was stopped and she was switched to carvedilol, but continued to have pauses.  She is now tachycardic in the 110s.  This is most likely 2/2 infection and aggressive diuresis.  Holding diuretics as above.   Time spent: 50 minutes-Greater than 50% of this time was spent in counseling, explanation of diagnosis, planning of further management, and coordination of care.  Signed, Skeet Latch, MD  04/13/2017, 7:58 AM

## 2017-04-13 NOTE — Progress Notes (Signed)
RT NOTE:  Pt refuses CPAP tonight. Pt states it is uncomfortable. Unit is in room if patient changes her mind. Pt understands she can contact RT if she changes her mind.

## 2017-04-14 LAB — CBC WITH DIFFERENTIAL/PLATELET
BASOS ABS: 0 10*3/uL (ref 0.0–0.1)
BASOS PCT: 0 %
Eosinophils Absolute: 0.1 10*3/uL (ref 0.0–0.7)
Eosinophils Relative: 1 %
HEMATOCRIT: 29 % — AB (ref 36.0–46.0)
HEMOGLOBIN: 9.2 g/dL — AB (ref 12.0–15.0)
Lymphocytes Relative: 26 %
Lymphs Abs: 3.3 10*3/uL (ref 0.7–4.0)
MCH: 24.6 pg — ABNORMAL LOW (ref 26.0–34.0)
MCHC: 31.7 g/dL (ref 30.0–36.0)
MCV: 77.5 fL — ABNORMAL LOW (ref 78.0–100.0)
Monocytes Absolute: 1 10*3/uL (ref 0.1–1.0)
Monocytes Relative: 8 %
NEUTROS ABS: 8.1 10*3/uL — AB (ref 1.7–7.7)
NEUTROS PCT: 65 %
Platelets: 352 10*3/uL (ref 150–400)
RBC: 3.74 MIL/uL — ABNORMAL LOW (ref 3.87–5.11)
RDW: 17 % — ABNORMAL HIGH (ref 11.5–15.5)
WBC: 12.5 10*3/uL — ABNORMAL HIGH (ref 4.0–10.5)

## 2017-04-14 LAB — BASIC METABOLIC PANEL
ANION GAP: 9 (ref 5–15)
BUN: 23 mg/dL — ABNORMAL HIGH (ref 6–20)
CALCIUM: 9.7 mg/dL (ref 8.9–10.3)
CHLORIDE: 94 mmol/L — AB (ref 101–111)
CO2: 32 mmol/L (ref 22–32)
Creatinine, Ser: 0.58 mg/dL (ref 0.44–1.00)
GFR calc non Af Amer: >60 mL/min (ref >60)
Glucose, Bld: 113 mg/dL — ABNORMAL HIGH (ref 65–99)
POTASSIUM: 4 mmol/L (ref 3.5–5.1)
Sodium: 135 mmol/L (ref 135–145)

## 2017-04-14 LAB — GLUCOSE, CAPILLARY
GLUCOSE-CAPILLARY: 126 mg/dL — AB (ref 65–99)
GLUCOSE-CAPILLARY: 119 mg/dL — AB (ref 65–99)
Glucose-Capillary: 140 mg/dL — ABNORMAL HIGH (ref 65–99)
Glucose-Capillary: 112 mg/dL — ABNORMAL HIGH (ref 65–99)

## 2017-04-14 LAB — CULTURE, BLOOD (ROUTINE X 2)
CULTURE: NO GROWTH
SPECIAL REQUESTS: ADEQUATE

## 2017-04-14 LAB — CORTISOL: CORTISOL PLASMA: 15.6 ug/dL

## 2017-04-14 LAB — LACTIC ACID, PLASMA: Lactic Acid, Venous: 0.7 mmol/L (ref 0.5–1.9)

## 2017-04-14 MED ORDER — SODIUM CHLORIDE 0.9 % IV BOLUS (SEPSIS)
250.0000 mL | Freq: Once | INTRAVENOUS | Status: AC
  Administered 2017-04-14: 250 mL via INTRAVENOUS

## 2017-04-14 MED ORDER — FAMOTIDINE 20 MG PO TABS: 20.0000 mg | ORAL_TABLET | Freq: Every day | ORAL | 0 refills | Status: DC

## 2017-04-14 MED ORDER — ENSURE ENLIVE PO LIQD: 237.0000 mL | Freq: Two times a day (BID) | ORAL | 12 refills | Status: DC

## 2017-04-14 MED ORDER — MIDODRINE HCL 2.5 MG PO TABS: 2.5000 mg | ORAL_TABLET | Freq: Two times a day (BID) | ORAL | 0 refills | Status: DC

## 2017-04-14 MED ORDER — SENNOSIDES-DOCUSATE SODIUM 8.6-50 MG PO TABS
1.0000 | ORAL_TABLET | Freq: Every day | ORAL | Status: DC
  Administered 2017-04-14 – 2017-04-15 (×2): 1 via ORAL
  Filled 2017-04-14 (×2): qty 1

## 2017-04-14 MED ORDER — ENOXAPARIN SODIUM 40 MG/0.4ML ~~LOC~~ SOLN: 40.0000 mg | SUBCUTANEOUS | 0 refills | Status: DC

## 2017-04-14 MED ORDER — METRONIDAZOLE 500 MG PO TABS: 500.0000 mg | ORAL_TABLET | Freq: Three times a day (TID) | ORAL | 0 refills | Status: AC

## 2017-04-14 MED ORDER — SENNOSIDES-DOCUSATE SODIUM 8.6-50 MG PO TABS: 1.0000 | ORAL_TABLET | Freq: Every day | ORAL | 0 refills | Status: DC

## 2017-04-14 MED ORDER — CEPHALEXIN 500 MG PO CAPS: 500.0000 mg | ORAL_CAPSULE | Freq: Three times a day (TID) | ORAL | 0 refills | Status: AC

## 2017-04-14 MED ORDER — METOPROLOL TARTRATE 5 MG/5ML IV SOLN: 2.5000 mg | INTRAVENOUS | 0 refills | Status: DC | PRN

## 2017-04-14 MED ORDER — INSULIN ASPART 100 UNIT/ML ~~LOC~~ SOLN: 0.0000 [IU] | Freq: Three times a day (TID) | SUBCUTANEOUS | 0 refills | Status: DC

## 2017-04-14 NOTE — Progress Notes (Signed)
[facility] Progress Note     Subjective: CC:  No complaints. Denies pain. Tolerating PO. Having bowel movements.   Objective: Vital signs in last 24 hours: Temp:  [97.8 F (36.6 C)-99.4 F (37.4 C)] 98.3 F (36.8 C) (07/16 1200) Pulse Rate:  [90-119] 90 (07/16 1200) Resp:  [21-36] 21 (07/16 1200) BP: (66-97)/(36-60) 77/41 (07/16 1200) SpO2:  [86 %-100 %] 100 % (07/16 1200) Weight:  [72.2 kg (159 lb 2.8 oz)] 72.2 kg (159 lb 2.8 oz) (07/16 0500) Last BM Date: 04/13/17  Intake/Output from previous day: 07/15 0701 - 07/16 0700 In: 954 [P.O.:854; I.V.:100] Out: 1700 [Urine:1700] Intake/Output this shift: No intake/output data recorded.  PE: Gen:  Alert, NAD, pleasant Pulm:  Normal effort  Abd: Soft, non-tender Sacral Wound: 4 x 4 x 5 cm with undermining in the 11 o'clock position as well as from 3-5 o'clock positions. Wound base is clean (see below) without necrosis or purulence.    Psych: A&Ox3   Lab Results:   Recent Labs  04/13/17 0330 04/14/17 0321  WBC 16.3* 12.5*  HGB 9.9* 9.2*  HCT 32.3* 29.0*  PLT 403* 352   BMET  Recent Labs  04/13/17 0330 04/14/17 0321  NA 135 135  K 4.2 4.0  CL 94* 94*  CO2 33* 32  GLUCOSE 143* 113*  BUN 19 23*  CREATININE 0.48 0.58  CALCIUM 9.9 9.7   PT/INR No results for input(s): LABPROT, INR in the last 72 hours. CMP     Component Value Date/Time   NA 135 04/14/2017 0321   NA 139 06/28/2016   K 4.0 04/14/2017 0321   CL 94 (L) 04/14/2017 0321   CO2 32 04/14/2017 0321   GLUCOSE 113 (H) 04/14/2017 0321   BUN 23 (H) 04/14/2017 0321   BUN 24 (A) 06/28/2016   CREATININE 0.58 04/14/2017 0321   CALCIUM 9.7 04/14/2017 0321   CALCIUM 9.9 04/04/2017 1907   PROT 5.7 (L) 04/05/2017 0348   ALBUMIN 2.7 (L) 04/05/2017 0348   AST 22 04/05/2017 0348   ALT 14 04/05/2017 0348   ALKPHOS 56 04/05/2017 0348   BILITOT 0.6 04/05/2017 0348   GFRNONAA >60 04/14/2017 0321   GFRAA >60 04/14/2017 0321   Lipase  No  results found for: LIPASE     Studies/Results: No results found.  Anti-infectives: Anti-infectives    Start     Dose/Rate Route Frequency Ordered Stop   04/14/17 0000  cephALEXin (KEFLEX) 500 MG capsule     500 mg Oral Every 8 hours 04/14/17 1140 05/14/17 2359   04/14/17 0000  metroNIDAZOLE (FLAGYL) 500 MG tablet     500 mg Oral Every 8 hours 04/14/17 1140 05/14/17 2359   04/12/17 1400  cephALEXin (KEFLEX) capsule 500 mg     500 mg Oral Every 8 hours 04/12/17 1015     04/12/17 1400  metroNIDAZOLE (FLAGYL) tablet 500 mg     500 mg Oral Every 8 hours 04/12/17 1015     04/06/17 1400  cefTRIAXone (ROCEPHIN) 2 g in dextrose 5 % 50 mL IVPB  Status:  Discontinued     2 g 100 mL/hr over 30 Minutes Intravenous Every 24 hours 04/05/17 1417 04/12/17 1015   04/05/17 1400  cefTRIAXone (ROCEPHIN) 1 g in dextrose 5 % 50 mL IVPB  Status:  Discontinued     1 g 100 mL/hr over 30 Minutes Intravenous Every 24 hours 04/04/17 1235 04/05/17 1417   04/04/17 1230  cefTRIAXone (ROCEPHIN) 2 g in dextrose 5 %  50 mL IVPB     2 g 100 mL/hr over 30 Minutes Intravenous  Once 04/04/17 1220 04/04/17 2213       Assessment/Plan  decubitus ulcer - bacterial colonization improving, no necrosis/infection - continue hydrotherapy and local wound care with BID wet-to-dry dressing changes - continue abx per ID (Keflex, Flagyl 7/14 >>)  - stable for discharge to [facility]    LOS: 10 days    Jill Alexanders , [facility] 04/14/2017, 12:58 PM Pager: [phone] Consults: [phone] Mon-Fri 7:00 am-4:30 pm Sat-Sun 7:00 am-11:30 am

## 2017-04-14 NOTE — Progress Notes (Signed)
Date:  April 14, 2017 Chart reviewed for concurrent status and case management needs. Will continue to follow patient progress. Discharge Planning: Have contact both LTACH's concerning requested hydrotherapy to the sacral wound. Expected discharge date: [id number] Velva Harman, BSN, [place], [place]

## 2017-04-14 NOTE — Progress Notes (Addendum)
Date: April 14, 2017 Patient has chosen [facility] this am at [phone].  Was notified by Elaina Hoops at approx 2 pm that patient wanted to go to [facility].  This was verified by the patient.  That she has freely chosen [facility].  Dr. Erlinda Hong notified of patient not being able to be dcd this pm due to insurance verification.  MD spoke with patient about cardiologist not being able to follow her at [facility] and that she need infectious diease to see her. Patient now wants to go to [facility].  Both representatives notified of patient's decision. Vernia Buff, [phone]

## 2017-04-14 NOTE — Progress Notes (Signed)
CSW consulted for SNF placement. RNCM reports that [facility] is being considered.   CSW signing off but will be available to assist with SNF placement if [facility] cannot assist with placement.  Werner Lean LCSW [phone]

## 2017-04-14 NOTE — Progress Notes (Signed)
PHARMACY - PHYSICIAN COMMUNICATION CRITICAL VALUE ALERT - BLOOD CULTURE IDENTIFICATION (BCID)  1 of 2 BC from 7/15 gram positive cocci in clusters. Lab will not repeat BCID. Culture is consistent with previous results 7/11 Bcx  1 of 2 CNS.   Name of physician (or Provider) Contacted: Xu via text via Amion  Changes to prescribed antibiotics required: MD to repreat BCx  Eudelia Bunch, Pharm.D. [phone] 04/14/2017 5:05 PM

## 2017-04-14 NOTE — Discharge Summary (Addendum)
Discharge Summary  Sharon Warner YQM:[id number] DOB: 1951-06-14  PCP: [name], MD  Admit date: 04/04/2017 Discharge date: 04/15/2017  Time spent: >56mins  Patient is transfer to long term care facility at [facility] for continued medical care, she needs to be followed up by cardiology, she needs have aggressive wound care (hydrotherapy), she need to be followed by infectious disease. Consider repeat CT pel  to follow up on small fluids collection at si joint. Please see discussion below.   Discharge Diagnoses:  Active Hospital Problems   Diagnosis Date Noted  . Streptococcal bacteremia 04/12/2017  . Sacroiliitis ([place]) 04/12/2017  . Gram-negative bacteremia 04/12/2017  . Acute on chronic combined systolic and diastolic CHF (congestive heart failure) ([place])   . Sacral decubitus ulcer, stage IV ([place]) 08/15/2016  . Hypercalcemia 04/18/2016  . Protein-calorie malnutrition ([place]) 04/16/2016  . Multinodular goiter 04/16/2016  . Paraplegia ([place]) 03/14/2016  . Obstructive sleep apnea 03/14/2016  . Neurogenic bladder 03/14/2016  . HTN (hypertension) 03/14/2016  . Morbid obesity ([place]) 03/14/2016  . Microcytic anemia 03/14/2016  . Type 2 diabetes mellitus ([place]) 03/14/2016    Resolved Hospital Problems   Diagnosis Date Noted Date Resolved  No resolved problems to display.    Discharge Condition: stable  Diet recommendation: heart healthy  Filed Weights   04/12/17 0343 04/14/17 0500 04/15/17 0500  Weight: 75.2 kg (165 lb 12.6 oz) 72.2 kg (159 lb 2.8 oz) 73.8 kg (162 lb 11.2 oz)    History of present illness:  Patient coming from: The patient is coming from home.  Chief Complaint: Fever  HPI: Sharon Warner is a 66 y.o. female with Past medical history of paraplegia, neurogenic bladder, wheelchair-bound, chronic indwelling Foley catheter, sacral decubitus ulcer, morbid obesity, HTN, type II DM, sacral osteomyelitis, chronic diastolic CHF. Patient presented with  complains of fever. In her usual health yesterday. This morning when she woke up and the home health nurse came to see her she had a temp of 101.4 and she was asked to go to ER. Denies having any complaints of chills, nausea, vomiting, abdominal pain, chest pain, headache, dizziness. No cough. No recent change in medications. Her Foley catheter is past due for a change for 2 days. She has a colostomy bag which was last emptied on Wednesday. Denies any recent change in medication other than decreasing Lopressor which was done almost a month ago.  ED Course: Presented with fever, was febrile with tachycardia, UA was showing pyuria and patient was started on antibiotics and was referred for admission  At her baseline wheelchair-bound And is ependent for most of her ADL; does not manages her medication on her own.  Hospital Course:  Principal Problem:   Streptococcal bacteremia Active Problems:   Morbid obesity ([place])   Type 2 diabetes mellitus (HCC)   HTN (hypertension)   Paraplegia (HCC)   Neurogenic bladder   Obstructive sleep apnea   Microcytic anemia   Protein-calorie malnutrition (HCC)   Multinodular goiter   Hypercalcemia   Sacral decubitus ulcer, stage IV (HCC)   Acute on chronic combined systolic and diastolic CHF (congestive heart failure) (HCC)   Sacroiliitis (HCC)   Gram-negative bacteremia  sepsis present on admission [facility]), bacteremia -Pt presents with fever, leukocytosis, sinus tachycardia and evidence of pyuria/( urine was cloudy with sediment initial on presentation). uti thought was the cause of sepsis and bacteremia, foley cath changed at time of admission -however urine culture no growth, blood culture +pos for proteus and strep species which is consistent  with culture from Last year in June 2017 when patient had polymicrobial bacteremia as well as sacral osteomyelitis and was treated with ceftriaxonex6weeks. -I have discussed the case with infectious disease Dr  Linus Salmons on 7/13 who recommend CT pel and general surgery consult for possible wound debridment -Ct pel with possible left si joint septic arthritis with fluids collection 4cmx2cm anterior to the joint, I have discussed with oncall orthopedic surgery Dr Elmyra Ricks on 7/14 who recommended IR and formal ID consult, per ortho Dr Elmyra Ricks should patient need ortho surgery in the future for SI Joint, patient needs [facility]. -I discussed with case with IR Dr Annamaria Boots who did not feel aspiration would change management, he recommended infectious disease consult. -general surgery and wound care consulted for wound care, start hydrotherapy per general surgery recommendations. -infectious disease Dr Megan Salon consulted, after discussion with patient about long term iv abx treatment vs trial of prolonged course of oral abx therapy , patient decided to try oral therapy, abx changed to keflex and flagyl from 7/14, please see id consult note from 7/14 for details ( she received total of 9days of iv rocephin since admission, abx changed to oral on 7/14) -d/c to LTAC  for hydrotherapy/agreesive wound care.   Leukocytosis: Wbc peaked at 16.3 on 7/15, wbc at discharge 11.4 From over diuresis? From infection?  Repeat ua and blood culture pending,  lactic acid 0.7, reassuring, patient does not look septic  Hypotension:  From over diuresis? From infection? patient looks stable, am cortisol level unremarkable. Trial of midodrine, she received two 250cc ivf boluses on 7/16, will try to avoid large volume ivf due to low ef,  -she does not have edema, on exam euvolemic to slightly dry at discharge Diuresis held.   Sinus tachycardia/bradycardia. -Pt presented with sinus tachycardia in the range of 130s and 140s iniitally on admission -Likely sequela of sepsis versus rebound tachycardia after holding beta blocker secondary to hypertension -Tachycardia resolved with resumption of beta blocker, however  , patient noted to be transiently profoundly bradycardic/sinus arrest  -keep mag>2, k >4, tsh 2.1, - cardiology consulted, now off betablocker , patient is deemed to be a poor candidate for pacemaker, meds adjustment per cardiology -cardiology continue to follow patient at [place].  acute on Chronic combined systolic and diastolic heart failure, acute hypoxic respiratory failure. She is weaned off oxygen supplement at discharge --Patient had been on torsemide prior to hospital admission. -she received fluids resuscitation for sepsis initially, cxr with findings of developing pulmonary edema -echo with reduced ef 25-30% - cardiology consulted,  Currently off betablocker, arb, on diuretics only. meds adjustment per cardiology.    Hypokalemia/hypomagnesemia: replace k/mag prn , keep K.4, mag>2  Hypercalcemia. -ca 11.3 on admission -Etiology remains unclear. -Home vitamin D supplementation on hold -Calcium levels have normalized  Type 2 diabetes mellitus.Not on diabetic medications prior to admission a1c in 02/2016 was 8.4, a1c this hospitalization is 5.1 -On sensitive sliding scale. -glcuose stable   OSA. -Continue CPAP Daily at bedtime. -Currently stable  Hoarse voice -Unclear etiology -Pt denies coughing, no sore throat, no n/v -Question laryngitis  --soft tissue neck CT no acute findings, rapid strep test negative -resolved.  Bedbound,  paraplegia,  Chronic stage 4 pressure injury to sacrum presented on admission, s/p colostomy and indwelling foley (get changed Q month) Wound care consulted  DVT prophylaxis: Lovenox subcutaneous (wonder if patient needs chronic lovenox due to baseline bedbound status) Code Status: Full code Family Communication: Patient  [facility]position [facility]: [facility] to [facility].  Per husband, they relocated from [place] to [place] since 02/2016. patient ended up admitted to [facility] due to sepsis/bacteremia the second day they  arrived to [place]. patient has since been in different LTACs and SNFs, she has not been able to stay home for more than a month before she was brought back to [facility] and admitted with sepsis on 7/6   Consultants:   Cardiology  Infectious disease   General surgery  Interventional radiology Dr Annamaria Boots over the phone on 7/14  Orthopedics surgery Dr Raphael Gibney over the phone on 7/14  Wound care  Procedures:   Hydrotherapy   Antimicrobials:            Anti-infectives     Start        Dose/Rate  Route  Frequency  Ordered  Stop    04/12/17 1400   cephALEXin (KEFLEX) capsule 500 mg      500 mg  Oral  Every 8 hours  04/12/17 1015       04/12/17 1400   metroNIDAZOLE (FLAGYL) tablet 500 mg      500 mg  Oral  Every 8 hours  04/12/17 1015       04/06/17 1400   cefTRIAXone (ROCEPHIN) 2 g in dextrose 5 % 50 mL IVPB  Status:  Discontinued      2 g  100 mL/hr over 30 Minutes  Intravenous  Every 24 hours  04/05/17 1417  04/12/17 1015    04/05/17 1400   cefTRIAXone (ROCEPHIN) 1 g in dextrose 5 % 50 mL IVPB  Status:  Discontinued      1 g  100 mL/hr over 30 Minutes  Intravenous  Every 24 hours  04/04/17 1235  04/05/17 1417    04/04/17 1230   cefTRIAXone (ROCEPHIN) 2 g in dextrose 5 % 50 mL IVPB      2 g  100 mL/hr over 30 Minutes  Intravenous   Once  04/04/17 1220  04/04/17 2213       Discharge Exam: BP (!) 89/46   Pulse 84   Temp 98.9 F (37.2 C) (Oral)   Resp (!) 26   Ht 5\' 1"  (1.549 m)   Wt 73.8 kg (162 lb 11.2 oz)   SpO2 100%   BMI 30.74 kg/m   General: chronically ill, Awake, laying in bed, in nad, hoarse voice has resolved Cardiovascular: slight sinus tachycardia Respiratory: decreased breath sounds at bilateral base,  no wheezing, no rales, no rhonchi Gastrointestinal system: Soft, nondistended, positive BS, + colostomy bag with soft stoll, indwelling foley Central nervous system: chronic paraplegia, no  sensation from waist down,  Extremities:+bilateral heal protection , no edema Skin: Chronic stage 4 pressure injury to sacrum presented on admission,   Discharge Instructions You were cared for by a hospitalist during your hospital stay. If you have any questions about your discharge medications or the care you received while you were in the hospital after you are discharged, you can call the unit and asked to speak with the hospitalist on call if the hospitalist that took care of you is not available. Once you are discharged, your primary care physician will handle any further medical issues. Please note that NO REFILLS for any discharge medications will be authorized once you are discharged, as it is imperative that you return to your primary care physician (or establish a relationship with a primary care physician if you do not have one) for your aftercare  needs so that they can reassess your need for medications and monitor your lab values.  Discharge Instructions    Diet - low sodium heart healthy    Complete by:  As directed    Carb modified     Allergies as of 04/15/2017      Reactions   Levaquin [levofloxacin In D5w] Nausea Only   Unknown childhood allergy   Penicillins Other (See Comments)   Has patient had a PCN reaction causing immediate rash, facial/tongue/throat swelling, SOB or lightheadedness with hypotension: Unknown Has patient had a PCN reaction causing severe rash involving mucus membranes or skin necrosis: Unknown Has patient had a PCN reaction that required hospitalization: No Has patient had a PCN reaction occurring within the last 10 years: No If all of the above answers are "NO", then may proceed with Cephalosporin use.      Medication List    STOP taking these medications   metoprolol succinate 25 MG 24 hr tablet Commonly known as:  TOPROL-XL   torsemide 10 MG tablet Commonly known as:  DEMADEX   TURMERIC PO     TAKE these medications   aspirin EC 81 MG  tablet Take 81 mg by mouth daily.   b complex vitamins tablet Take 1 tablet by mouth daily.   Biotin 10000 MCG Tabs Take 1,000 mcg by mouth daily.   cephALEXin 500 MG capsule Commonly known as:  KEFLEX Take 1 capsule (500 mg total) by mouth every 8 (eight) hours.   cholecalciferol 1000 units tablet Commonly known as:  VITAMIN D Take 2,000 Units by mouth daily.   enoxaparin 40 MG/0.4ML injection Commonly known as:  LOVENOX Inject 0.4 mLs (40 mg total) into the skin daily.   famotidine 20 MG tablet Commonly known as:  PEPCID Take 1 tablet (20 mg total) by mouth at bedtime.   feeding supplement (ENSURE ENLIVE) Liqd Take 237 mLs by mouth 2 (two) times daily between meals.   insulin aspart 100 UNIT/ML injection Commonly known as:  novoLOG Inject 0-15 Units into the skin 3 (three) times daily with meals.   KLOR-CON 10 10 MEQ tablet Generic drug:  potassium chloride Take 10 mEq by mouth daily.   Lactobacillus Tabs 250 mg by mouth bid   Melatonin 3 MG Tabs Take 3 mg by mouth as needed (sleep).   metoprolol tartrate 5 MG/5ML Soln injection Commonly known as:  LOPRESSOR Inject 2.5 mLs (2.5 mg total) into the vein every 4 (four) hours as needed (give for heart rate greater than 120, hold if sbp less than 90).   metroNIDAZOLE 500 MG tablet Commonly known as:  FLAGYL Take 1 tablet (500 mg total) by mouth every 8 (eight) hours.   midodrine 2.5 MG tablet Commonly known as:  PROAMATINE Take 1 tablet (2.5 mg total) by mouth 2 (two) times daily with a meal.   naproxen sodium 220 MG tablet Commonly known as:  ANAPROX Take 440 mg by mouth as needed (pain temperature).   senna-docusate 8.6-50 MG tablet Commonly known as:  Senokot-S Take 1 tablet by mouth at bedtime.   traMADol-acetaminophen 37.5-325 MG tablet Commonly known as:  ULTRACET Take 1 tablet by mouth every 4 (four) hours as needed. for pain   zinc gluconate 50 MG tablet Take 50 mg by mouth daily.       Allergies  Allergen Reactions  . Levaquin [Levofloxacin In D5w] Nausea Only    Unknown childhood allergy  . Penicillins Other (See Comments)    Has patient  had a PCN reaction causing immediate rash, facial/tongue/throat swelling, SOB or lightheadedness with hypotension: Unknown Has patient had a PCN reaction causing severe rash involving mucus membranes or skin necrosis: Unknown Has patient had a PCN reaction that required hospitalization: No Has patient had a PCN reaction occurring within the last 10 years: No If all of the above answers are "NO", then may proceed with Cephalosporin use.   Follow-up Information    Michel Bickers, MD Follow up in 3 week(s).   Specialty:  Infectious Diseases Why:  for  Contact information: [address] [place] [address] [phone]        [name], MD Follow up.   Specialty:  Family Medicine Contact information: [place] [address] [phone]            The results of significant diagnostics from this hospitalization (including imaging, microbiology, ancillary and laboratory) are listed below for reference.    Significant Diagnostic Studies: Dg Chest 1 View  Result Date: 04/07/2017 CLINICAL DATA:  Shortness of breath EXAM: CHEST 1 VIEW COMPARISON:  04/04/2017 FINDINGS: Cardiomegaly with central vascular congestion and perihilar interstitial opacities suspicious for mild edema. No significant effusion. Mild atelectasis at the right base. No pneumothorax. IMPRESSION: Mild cardiomegaly with central vascular congestion and mild interstitial pulmonary edema. Atelectasis at the right lung base. Electronically Signed   By: Donavan Foil M.D.   On: 04/07/2017 00:32   Dg Chest 2 View  Result Date: 04/04/2017 CLINICAL DATA:  Chronic coughing for the past couple months. Pt has tachycardia today. No SOB. No chest pains. Pt takes HTN meds. Pt is diabetic. Nonsmoker. No hx of asthma,  COPD, or AFIB. Pt has CHF. EXAM: CHEST - 2 VIEW COMPARISON:  03/13/2016 FINDINGS: Lungs are clear.  Mild elevation of the left diaphragmatic leaflet. Heart size and mediastinal contours are within normal limits. No effusion. Visualized bones unremarkable. IMPRESSION: No acute cardiopulmonary disease. Electronically Signed   By: Lucrezia Europe M.D.   On: 04/04/2017 12:28   Ct Soft Tissue Neck W Contrast  Result Date: 04/08/2017 CLINICAL DATA:  Dysphagia. History of paraplegia, neurogenic bladder with indwelling catheter, colostomy bag, sacral osteomyelitis, diabetes. EXAM: CT NECK WITH CONTRAST TECHNIQUE: Multidetector CT imaging of the neck was performed using the standard protocol following the bolus administration of intravenous contrast. CONTRAST:  97mL ISOVUE-300 IOPAMIDOL (ISOVUE-300) INJECTION 61% COMPARISON:  Chest radiograph April 07, 2017 and CT chest April 05, 2017 FINDINGS: Moderately motion degraded examination. Streak artifact from multiple overlying lines. PHARYNX AND LARYNX: Normal.  Widely patent airway. SALIVARY GLANDS: Normal. THYROID: 2 x 2 cm complex LEFT thyroid nodule with coarse calcifications. Smaller RIGHT thyroid nodule present. Heterogeneous thyroid wound. LYMPH NODES: No lymphadenopathy by CT size criteria. VASCULAR: Normal. LIMITED INTRACRANIAL: Normal. VISUALIZED ORBITS: Normal. MASTOIDS AND VISUALIZED PARANASAL SINUSES: Well-aerated. SKELETON: Nonacute. Severe C5-6 disc height loss and endplate spurring compatible with degenerative discs. UPPER CHEST: Large RIGHT and small LEFT pleural effusions with venous congestion incompletely assessed. OTHER: None. IMPRESSION: 1. No acute process in the neck on this moderately motion degraded examination. Patent airway. 2. Increasing large RIGHT and small LEFT pleural effusions, and congestion consistent with pulmonary edema. 3. **An incidental finding of potential clinical significance has been found. 2.1 cm LEFT thyroid nodule. Recommend thyroid  ultrasound on nonemergent basis. This follows ACR consensus guidelines: Managing Incidental Thyroid Nodules Detected on Imaging: White Paper of the ACR Incidental Thyroid Findings Committee. J Am Coll Radiol 2015; 12:143-150. **  Electronically Signed   By: Elon Alas M.D.   On: 04/08/2017 18:53   Ct Angio Chest Pe W Or Wo Contrast  Result Date: 04/06/2017 CLINICAL DATA:  Tachycardia.  Sepsis, urinary tract infection. EXAM: CT ANGIOGRAPHY CHEST WITH CONTRAST TECHNIQUE: Multidetector CT imaging of the chest was performed using the standard protocol during bolus administration of intravenous contrast. Multiplanar CT image reconstructions and MIPs were obtained to evaluate the vascular anatomy. CONTRAST:  75 cc Isovue 370 IV COMPARISON:  Radiograph yesterday CT 03/13/2016 FINDINGS: Cardiovascular: There are no filling defects within the pulmonary arteries to suggest pulmonary embolus to the proximal segmental level. Subsegmental branches cannot be assessed due to contrast bolus timing and breathing motion artifact. Again seen dilatation of main pulmonary artery measuring 4.1 cm. Unchanged aneurysmal dilatation of the ascending aorta maximal dimension 4.2 cm. No aortic dissection. Borderline mild cardiomegaly. Minimal pericardial fluid/thickening. Mediastinum/Nodes: No mediastinal hilar adenopathy. The esophagus is decompressed. There is heterogeneous low-density enlargement of the thyroid gland with calcifications on the left, unchanged in CT appearance. Lungs/Pleura: Breathing motion artifact limits detailed assessment. Elevated right hemidiaphragm with adjacent atelectasis and trace pleural effusion. Minimal left basilar atelectasis and pleural fluid/thickening. No consolidation to suggest pneumonia. No convincing pulmonary edema. No pneumothorax. Upper Abdomen: Calcified gallstones within the gallbladder. Liver appears prominent size with probable steatosis. Musculoskeletal: Minimal anterior wedging of T8  and T5 vertebral body that was not seen on prior exam. Scoliotic curvature of the thoracic spine. Review of the MIP images confirms the above findings. IMPRESSION: 1. No pulmonary embolus to the proximal segmental level. Again seen dilatation of the main pulmonary artery suggesting pulmonary arterial hypertension. 2. Unchanged 4.2 cm aneurysmal dilatation of the ascending aorta. Recommend annual imaging followup by CTA or MRA. This recommendation follows 2010 ACCF/AHA/AATS/ACR/ASA/SCA/SCAI/SIR/STS/SVM Guidelines for the Diagnosis and Management of Patients with Thoracic Aortic Disease. Circulation. 2010; 121: U314-H702 3. Elevated right hemidiaphragm with adjacent atelectasis and small pleural effusion. Minimal left basilar atelectasis and pleural fluid. 4. Mild compression fractures of T5 and T8 vertebral bodies, age indeterminate but new from June 2017. 5. Cholelithiasis, the gallbladder is incompletely imaged. 6. Enlargement of the thyroid gland low-density, probable nodules. This is unchanged in CT appearance. Consider nonemergent ultrasound characterization. Electronically Signed   By: Jeb Levering M.D.   On: 04/06/2017 00:35   Ct Pelvis W Contrast  Addendum Date: 04/11/2017   ADDENDUM REPORT: 04/11/2017 19:47 ADDENDUM: Additional finding to musculoskeletal section: Bilateral intramedullary femoral rods across old left distal shaft fracture. There is somewhat posterior peripheral positioning of the right femoral rod. There is a detached fixating screw within the medial, posterior tissues of the upper thigh/gluteal region. Electronically Signed   By: Donavan Foil M.D.   On: 04/11/2017 19:47   Addendum Date: 04/11/2017   ADDENDUM REPORT: 04/11/2017 19:28 ADDENDUM: Left external iliac and inguinal adenopathy likely reactive Electronically Signed   By: Donavan Foil M.D.   On: 04/11/2017 19:28   Result Date: 04/11/2017 CLINICAL DATA:  Sacral decubitus ulcer, fever EXAM: CT PELVIS WITH CONTRAST  TECHNIQUE: Multidetector CT imaging of the pelvis was performed using the standard protocol following the bolus administration of intravenous contrast. CONTRAST:  170mL ISOVUE-300 IOPAMIDOL (ISOVUE-300) INJECTION 61% COMPARISON:  03/15/2016, 03/13/2016 FINDINGS: Urinary Tract: Foley catheter in the bladder with small air present in the bladder. Very thick-walled appearance of the bladder. Low lying left kidney containing multiple stones, measuring up to 6 mm in the lower pole. No obvious hydronephrosis. Bowel: Mild rectum thickening. Left lower  quadrant colostomy with large amount of stool. Vascular/Lymphatic: Aortic atherosclerosis. subcentimeter periaortic lymph nodes. Subcentimeter pelvic sidewall lymph nodes. Enlarged left external iliac nodes measuring up to 2.1 cm. Multiple enlarged left inguinal lymph nodes measuring up to 2.2 cm in short axis. Reproductive:  Status post hysterectomy.  No adnexal masses. Other: Diffuse subcutaneous edema. Mild very rectal soft tissue stranding and small amount of fluid in the pelvis. Musculoskeletal: Large, deep sacral decubitus ulcer, measuring at least 6.7 cm transverse. Underlying bony sclerosis and erosive changes consistent with chronic osteomyelitis. Soft tissue thickening around the left posterior SI joint with bony erosive changes present. Fluid and gas collection anterior to the left SI joint and abutting or involving the left iliacus muscle, consistent with abscess. This measures 2.5 by 2.1 by 4 cm. IMPRESSION: 1. Deep sacral decubitus ulcer down to the bone with underlying sclerosis and extensive erosive changes, consistent with osteomyelitis. More acute appearing soft tissue and erosive change involving the left posterior SI joint. There is gas and mildly rim enhancing fluid collection anterior to the left SI joint which appears contiguous with the joint space, this measures 4 cm cranial caudad by 2.1 cm transverse. The collective findings are concerning for left  septic arthritis of the sacroiliac joint with associated abscess anterior to the joint. 2. Low lying left kidney with multiple stones. Thick-walled urinary bladder with Foley catheter could be due to cystitis 3. Mild rectal wall thickening, query proctitis. Electronically Signed: By: Donavan Foil M.D. On: 04/11/2017 19:00   Dg Chest Port 1 View  Result Date: 04/12/2017 CLINICAL DATA:  Shortness of breath today EXAM: PORTABLE CHEST 1 VIEW COMPARISON:  04/07/2017 FINDINGS: Cardiomegaly with vascular congestion. Left lower lobe atelectasis or infiltrate is similar to prior study. Probable small left effusion. No overt edema. No acute bony abnormality. IMPRESSION: Small left effusion with left lower lobe atelectasis or infiltrate. Cardiomegaly. Electronically Signed   By: Rolm Baptise M.D.   On: 04/12/2017 09:15   Dg Chest Port 1 View  Result Date: 04/07/2017 CLINICAL DATA:  SOB. Hx of HTN and DM. EXAM: PORTABLE CHEST 1 VIEW COMPARISON:  04/07/2017 FINDINGS: Heart is enlarged. There are perihilar infiltrates. More focal opacity is identified at the lung bases, partially obscuring the hemidiaphragms. Findings are consistent with pulmonary edema, similar in appearance to exam earlier today. IMPRESSION: Congestive heart failure. Electronically Signed   By: Nolon Nations M.D.   On: 04/07/2017 08:22    Microbiology: Recent Results (from the past 240 hour(s))  Rapid strep screen (not at [facility])     Status: None   Collection Time: 04/09/17  9:18 AM  Result Value Ref Range Status   Streptococcus, Group A Screen (Direct) NEGATIVE NEGATIVE Final    Comment: (NOTE) A Rapid Antigen test may result negative if the antigen level in the sample is below the detection level of this test. The FDA has not cleared this test as a stand-alone test therefore the rapid antigen negative result has reflexed to a Group A Strep culture.   Culture, group A strep     Status: None   Collection Time: 04/09/17  9:18 AM  Result  Value Ref Range Status   Specimen Description THROAT  Final   Special Requests NONE Reflexed from [id number]  Final   Culture   Final    NO GROUP A STREP (S.PYOGENES) ISOLATED Performed at [facility], [address] [place], [place] [address]    Report Status 04/11/2017 FINAL  Final  Culture,  blood (routine x 2)     Status: None   Collection Time: 04/09/17  9:46 AM  Result Value Ref Range Status   Specimen Description BLOOD LEFT ARM  Final   Special Requests IN PEDIATRIC BOTTLE Blood Culture adequate volume  Final   Culture   Final    NO GROWTH 5 DAYS Performed at [facility], [place] [address] [place], [place] [address]    Report Status 04/14/2017 FINAL  Final  Culture, blood (routine x 2)     Status: Abnormal   Collection Time: 04/09/17  9:46 AM  Result Value Ref Range Status   Specimen Description BLOOD RIGHT ARM  Final   Special Requests IN PEDIATRIC BOTTLE Blood Culture adequate volume  Final   Culture  Setup Time   Final    GRAM POSITIVE COCCI IN CLUSTERS IN PEDIATRIC BOTTLE CRITICAL RESULT CALLED TO, READ BACK BY AND VERIFIED WITH: A. Pham Pharm.D. 12:10 04/10/17 ([email or web address])    Culture (A)  Final    STAPHYLOCOCCUS SPECIES (COAGULASE NEGATIVE) THE SIGNIFICANCE OF ISOLATING THIS ORGANISM FROM A SINGLE SET OF BLOOD CULTURES WHEN MULTIPLE SETS ARE DRAWN IS UNCERTAIN. PLEASE NOTIFY THE MICROBIOLOGY DEPARTMENT WITHIN ONE WEEK IF SPECIATION AND SENSITIVITIES ARE REQUIRED. Performed at [facility], [place] [address] [place], [place] [address]    Report Status 04/12/2017 FINAL  Final  Blood Culture ID Panel (Reflexed)     Status: Abnormal   Collection Time: 04/09/17  9:46 AM  Result Value Ref Range Status   Enterococcus species NOT DETECTED NOT DETECTED Final   Listeria monocytogenes NOT DETECTED NOT DETECTED Final   Staphylococcus species DETECTED (A) NOT DETECTED Final    Comment: Methicillin (oxacillin) resistant coagulase negative staphylococcus. Possible blood  culture contaminant (unless isolated from more than one blood culture draw or clinical case suggests pathogenicity). No antibiotic treatment is indicated for blood  culture contaminants. CRITICAL RESULT CALLED TO, READ BACK BY AND VERIFIED WITH: A. Pham Pharm.D. 12:10 04/10/17 ([email or web address])    Staphylococcus aureus NOT DETECTED NOT DETECTED Final   Methicillin resistance DETECTED (A) NOT DETECTED Final    Comment: CRITICAL RESULT CALLED TO, READ BACK BY AND VERIFIED WITH: A. Pham Pharm.D. 12:10 04/10/17 ([email or web address])    Streptococcus species NOT DETECTED NOT DETECTED Final   Streptococcus agalactiae NOT DETECTED NOT DETECTED Final   Streptococcus pneumoniae NOT DETECTED NOT DETECTED Final   Streptococcus pyogenes NOT DETECTED NOT DETECTED Final   Acinetobacter baumannii NOT DETECTED NOT DETECTED Final   Enterobacteriaceae species NOT DETECTED NOT DETECTED Final   Enterobacter cloacae complex NOT DETECTED NOT DETECTED Final   Escherichia coli NOT DETECTED NOT DETECTED Final   Klebsiella oxytoca NOT DETECTED NOT DETECTED Final   Klebsiella pneumoniae NOT DETECTED NOT DETECTED Final   Proteus species NOT DETECTED NOT DETECTED Final   Serratia marcescens NOT DETECTED NOT DETECTED Final   Haemophilus influenzae NOT DETECTED NOT DETECTED Final   Neisseria meningitidis NOT DETECTED NOT DETECTED Final   Pseudomonas aeruginosa NOT DETECTED NOT DETECTED Final   Candida albicans NOT DETECTED NOT DETECTED Final   Candida glabrata NOT DETECTED NOT DETECTED Final   Candida krusei NOT DETECTED NOT DETECTED Final   Candida parapsilosis NOT DETECTED NOT DETECTED Final   Candida tropicalis NOT DETECTED NOT DETECTED Final    Comment: Performed at [facility], [place] [address], [place], [place] [address]  Culture, blood (routine x 2)     Status: None (Preliminary result)   Collection Time: 04/13/17  7:40 AM  Result Value Ref Range Status   Specimen Description BLOOD LEFT HAND  Final   Special Requests IN  PEDIATRIC BOTTLE Blood Culture adequate volume  Final   Culture   Final    NO GROWTH 1 DAY Performed at [facility], [address] [place], [place] [address]    Report Status PENDING  Incomplete  Culture, blood (routine x 2)     Status: None (Preliminary result)   Collection Time: 04/13/17  7:41 AM  Result Value Ref Range Status   Specimen Description BLOOD LEFT ANTECUBITAL  Final   Special Requests   Final    BOTTLES DRAWN AEROBIC ONLY Blood Culture adequate volume   Culture  Setup Time   Final    GRAM POSITIVE COCCI IN CLUSTERS AEROBIC BOTTLE ONLY CRITICAL RESULT CALLED TO, READ BACK BY AND VERIFIED WITH: A PHAM,PHARMD AT 04/14/17 BY L BENFIELD Performed at [facility], [place] [address] [place], [place] [address]    Culture GRAM POSITIVE COCCI  Final   Report Status PENDING  Incomplete     Labs: Basic Metabolic Panel:  Recent Labs Lab 04/10/17 0316 04/11/17 0254 04/12/17 0334 04/13/17 0330 04/14/17 0321 04/15/17 0254  NA 142 137 136 135 135 134*  K 3.4* 3.5 4.3 4.2 4.0 3.9  CL 103 98* 93* 94* 94* 96*  CO2 30 31 31  33* 32 31  GLUCOSE 125* 122* 133* 143* 113* 102*  BUN 24* 21* 22* 19 23* 25*  CREATININE 0.66 0.53 0.56 0.48 0.58 0.48  CALCIUM 9.8 9.7 9.8 9.9 9.7 9.3  MG 2.2  --  2.1  --   --  1.9   Liver Function Tests: No results for input(s): AST, ALT, ALKPHOS, BILITOT, PROT, ALBUMIN in the last 168 hours. No results for input(s): LIPASE, AMYLASE in the last 168 hours. No results for input(s): AMMONIA in the last 168 hours. CBC:  Recent Labs Lab 04/09/17 0301 04/12/17 0334 04/13/17 0330 04/14/17 0321 04/15/17 0254  WBC 9.5 15.3* 16.3* 12.5* 11.4*  NEUTROABS  --  10.5* 11.9* 8.1* 7.4  HGB 9.8* 11.1* 9.9* 9.2* 8.1*  HCT 31.5* 36.4 32.3* 29.0* 26.3*  MCV 77.6* 77.6* 77.5* 77.5* 76.7*  PLT 311 456* 403* 352 269   Cardiac Enzymes: No results for input(s): CKTOTAL, CKMB, CKMBINDEX, TROPONINI in the last 168 hours. BNP: BNP (last 3  results)  Recent Labs  04/12/17 0334  BNP 1,317.3*    ProBNP (last 3 results) No results for input(s): PROBNP in the last 8760 hours.  CBG:  Recent Labs Lab 04/14/17 0721 04/14/17 1137 04/14/17 1637 04/14/17 2205 04/15/17 0725  GLUCAP 126* 119* 140* 112* 94       Signed:  Leela Vanbrocklin MD, PhD  [facility] 04/15/2017, 8:58 AM

## 2017-04-14 NOTE — Progress Notes (Signed)
PROGRESS NOTE    Sharon Warner  DTO:[id number] DOB: 02-27-1951 DOA: 04/04/2017 PCP: [name], MD    Brief Narrative:  66 y.o. female with Past medical history of paraplegia, neurogenic bladder, wheelchair-bound, chronic indwelling Foley catheter, sacral decubitus ulcer, morbid obesity, HTN, type II DM, sacral osteomyelitis, chronic diastolic CHF. Patient presented with complains of fever. In her usual health yesterday. This morning when she woke up and the home health nurse came to see her she had a temp of 101.4 and she was asked to go to ER. Denies having any complaints of chills, nausea, vomiting, abdominal pain, chest pain, headache, dizziness. No cough. No recent change in medications. Her Foley catheter is past due for a change for 2 days. She has a colostomy bag which was last emptied on Wednesday. Denies any recent change in medication other than decreasing Lopressor which was done almost a month ago.  ED Course: Presented with fever, was febrile with tachycardia, UA was showing pyuria and patient was started on antibiotics and was referred for admission  Assessment & Plan:   Principal Problem:   Streptococcal bacteremia Active Problems:   Morbid obesity ([place])   Type 2 diabetes mellitus (HCC)   HTN (hypertension)   Paraplegia (HCC)   Neurogenic bladder   Obstructive sleep apnea   Microcytic anemia   Protein-calorie malnutrition (HCC)   Multinodular goiter   Hypercalcemia   Sacral decubitus ulcer, stage IV (HCC)   Acute on chronic combined systolic and diastolic CHF (congestive heart failure) (HCC)   Sacroiliitis (HCC)   Gram-negative bacteremia  sepsis present on admission [facility]), bacteremia -Pt presents with fever, leukocytosis, sinus tachycardia and evidence of pyuria/( urine was cloudy with sediment initial on presentation). uti thought was the cause of sepsis and bacteremia, foley cath changed at time of admission -however urine culture no growth, blood  culture +pos for proteus and strep species which is consistent with culture from Last year in June 2017 when patient had polymicrobial bacteremia as well as sacral osteomyelitis and was treated with ceftriaxonex6weeks. -I have discussed the case with infectious disease Dr Linus Salmons on 7/13 who recommend CT pel and general surgery consult for possible wound debridment -Ct pel with possible left si joint septic arthritis with fluids collection 4cmx2cm anterior to the joint, I have discussed with oncall orthopedic surgery Dr Raphael Gibney on 7/14 who recommended IR and formal ID consult, per ortho should patient need ortho surgery in the future for SI Joint, patient need to be referred to academic center. -I discussed with case with IR Dr Annamaria Boots who do not feel aspiration will change management, he recommended infectious disease consult. -general surgery and wound care consulted for wound care, start hydrotherapy per general surgery recommendations. -infectious disease Dr Megan Salon consulted, after discussion with patient about long term iv abx treatment vs trial of prolonged course of oral abx therapy , patient decided to try oral therapy, abx changed to keflex and flagyl from 7/14, please see id consult note from 7/14 for details -will need LTAC /SNF for hydrotherapy  Leukocytosis:wbc start to rise from 7/14 From over diuresis? From infection?  Repeat ua and blood culture, lactic acid  Hypotension:  From over diuresis? From infection? patient looks stable, am cortisol level pending Trial of midodrine, will avoid of ivf due to low ef, she does not have edema, on exam euvolemic to slightly dry   Sinus tachycardia/bradycardia. -Pt presented with sinus tachycardia in the range of 130s and 140s iniitally on admission -Likely sequela of sepsis  versus rebound tachycardia after holding beta blocker secondary to hypertension -Tachycardia resolved with resumption of beta blocker, however , patient noted to be  transiently profoundly bradycardic/sinus arrest  -keep mag>2, k >4, tsh 2.1, - cardiology consulted, now off betablocker , patient is deemed to be a poor candidate for pacemaker, meds adjustment per cardiology  acute on Chronic combined systolic and diastolic heart failure, acute hypoxic respiratory failure. --Patient had been on torsemide prior to hospital admission. -she received fluids resuscitation for sepsis initially, cxr with findings of developing pulmonary edema -echo with reduced ef 25-30% - cardiology consulted,  Currently off betablocker, arb, on diuretics only. meds adjustment per cardiology. Wean oxygen.   Hypokalemia/hypomagnesemia: replace k/mag prn , keep K.4, mag>2   Hypercalcemia. -ca 11.3 on admission -Etiology remains unclear. -Home vitamin D supplementation on hold -Calcium levels have normalized  Type 2 diabetes mellitus. -On sensitive sliding scale. -Not on diabetic medications prior to admission -Labs reviewed. Currently stable   OSA. -Continue CPAP Daily at bedtime. -Currently stable  Hoarse voice -Unclear etiology -Pt denies coughing, no sore throat, no n/v -Question laryngitis  --soft tissue neck CT no acute findings, rapid strep test negative -resolved.  Bedbound,  paraplegia,  Chronic stage 4 pressure injury to sacrum presented on admission, s/p colostomy and indwelling foley (get changed Q month) Wound care consulted  DVT prophylaxis: Lovenox subcutaneous (wonder if patient needs chronic lovenox due to baseline bedbound status) Code Status: Full code Family Communication: Patient Disposition Plan: remain in stepdown due to tachy/brady Per husband, they relocated from [place] to [place] since 02/2016, the second day they arrived to [facility], patient end up admitted to [facility] due to sepsis/bacteremia, patient has since been in different LTAC and SNF, she has not been able to stay home for more than a month before she was brought  back to [place] [facility] and admitted with sepsis on 7/6  Will need LTAC /SNF at discharge this time due to the need of hydrotherapy.  Consultants:   Cardiology  Infectious disease   General surgery  Interventional radiology Dr Annamaria Boots over the phone on 7/14  Orthopedics surgery Dr Raphael Gibney over the phone on 7/14  Wound care  Procedures:   none  Antimicrobials: Anti-infectives    Start     Dose/Rate Route Frequency Ordered Stop   04/12/17 1400  cephALEXin (KEFLEX) capsule 500 mg     500 mg Oral Every 8 hours 04/12/17 1015     04/12/17 1400  metroNIDAZOLE (FLAGYL) tablet 500 mg     500 mg Oral Every 8 hours 04/12/17 1015     04/06/17 1400  cefTRIAXone (ROCEPHIN) 2 g in dextrose 5 % 50 mL IVPB  Status:  Discontinued     2 g 100 mL/hr over 30 Minutes Intravenous Every 24 hours 04/05/17 1417 04/12/17 1015   04/05/17 1400  cefTRIAXone (ROCEPHIN) 1 g in dextrose 5 % 50 mL IVPB  Status:  Discontinued     1 g 100 mL/hr over 30 Minutes Intravenous Every 24 hours 04/04/17 1235 04/05/17 1417   04/04/17 1230  cefTRIAXone (ROCEPHIN) 2 g in dextrose 5 % 50 mL IVPB     2 g 100 mL/hr over 30 Minutes Intravenous  Once 04/04/17 1220 04/04/17 2213      Subjective:   urine output 1700cc last 24hrs No fever, tmax 99.4, wbc improving Intermittent sinus tachycardia, no bradycardia last 24hrs She reports has a good night, this morning she feels "pretty good", her voice is back to  normal.   Objective: Vitals:   04/14/17 0400 04/14/17 0500 04/14/17 0543 04/14/17 0800  BP: (!) 66/36  (!) 83/56   Pulse: 96  (!) 104   Resp: (!) 24  (!) 26   Temp: 99.4 F (37.4 C)   99 F (37.2 C)  TempSrc: Oral   Oral  SpO2: 99%  (!) 86%   Weight:  72.2 kg (159 lb 2.8 oz)    Height:        Intake/Output Summary (Last 24 hours) at 04/14/17 0849 Last data filed at 04/14/17 0600  Gross per 24 hour  Intake              744 ml  Output             1350 ml  Net             -606 ml   Filed Weights    04/11/17 0456 04/12/17 0343 04/14/17 0500  Weight: 75.4 kg (166 lb 3.6 oz) 75.2 kg (165 lb 12.6 oz) 72.2 kg (159 lb 2.8 oz)    Examination: General exam: chronically ill, Awake, laying in bed, in nad, hoarse voice has resolved. Respiratory system: decreased breath sounds at bilateral base,  no wheezing, no rales, no rhonchi Cardiovascular system: regular rate, s1, s2 Gastrointestinal system: Soft, nondistended, positive BS, + colostomy bag with soft stoll, indwelling foley Central nervous system: chronic paraplegia, no sensation from waist down,  Extremities:+bilateral heal protection , no edema Skin: Chronic stage 4 pressure injury to sacrum presented on admission,  Psychiatry: Mood normal // no visual hallucinations    Data Reviewed: I have personally reviewed following labs and imaging studies  CBC:  Recent Labs Lab 04/08/17 0318 04/09/17 0301 04/12/17 0334 04/13/17 0330 04/14/17 0321  WBC 10.4 9.5 15.3* 16.3* 12.5*  NEUTROABS  --   --  10.5* 11.9* 8.1*  HGB 9.7* 9.8* 11.1* 9.9* 9.2*  HCT 31.5* 31.5* 36.4 32.3* 29.0*  MCV 77.0* 77.6* 77.6* 77.5* 77.5*  PLT 285 311 456* 403* 696   Basic Metabolic Panel:  Recent Labs Lab 04/08/17 0318  04/10/17 0316 04/11/17 0254 04/12/17 0334 04/13/17 0330 04/14/17 0321  NA 137  < > 142 137 136 135 135  K 3.6  < > 3.4* 3.5 4.3 4.2 4.0  CL 110  < > 103 98* 93* 94* 94*  CO2 20*  < > 30 31 31  33* 32  GLUCOSE 151*  < > 125* 122* 133* 143* 113*  BUN 20  < > 24* 21* 22* 19 23*  CREATININE 0.52  < > 0.66 0.53 0.56 0.48 0.58  CALCIUM 9.9  < > 9.8 9.7 9.8 9.9 9.7  MG 1.8  --  2.2  --  2.1  --   --   < > = values in this interval not displayed. GFR: Estimated Creatinine Clearance: 64.6 mL/min (by C-G formula based on SCr of 0.58 mg/dL). Liver Function Tests: No results for input(s): AST, ALT, ALKPHOS, BILITOT, PROT, ALBUMIN in the last 168 hours. No results for input(s): LIPASE, AMYLASE in the last 168 hours. No results for input(s):  AMMONIA in the last 168 hours. Coagulation Profile: No results for input(s): INR, PROTIME in the last 168 hours. Cardiac Enzymes: No results for input(s): CKTOTAL, CKMB, CKMBINDEX, TROPONINI in the last 168 hours. BNP (last 3 results) No results for input(s): PROBNP in the last 8760 hours. HbA1C: No results for input(s): HGBA1C in the last 72 hours. CBG:  Recent Labs Lab 04/13/17  3474 04/13/17 1321 04/13/17 1631 04/13/17 2224 04/14/17 0721  GLUCAP 156* 130* 166* 114* 126*   Lipid Profile: No results for input(s): CHOL, HDL, LDLCALC, TRIG, CHOLHDL, LDLDIRECT in the last 72 hours. Thyroid Function Tests: No results for input(s): TSH, T4TOTAL, FREET4, T3FREE, THYROIDAB in the last 72 hours. Anemia Panel: No results for input(s): VITAMINB12, FOLATE, FERRITIN, TIBC, IRON, RETICCTPCT in the last 72 hours. Sepsis Labs:  Recent Labs Lab 04/09/17 0301 04/14/17 0321  PROCALCITON 1.36  --   LATICACIDVEN  --  0.7    Recent Results (from the past 240 hour(s))  Culture, blood (Routine x 2)     Status: Abnormal   Collection Time: 04/04/17 12:42 PM  Result Value Ref Range Status   Specimen Description BLOOD BLOOD RIGHT ARM  Final   Special Requests   Final    BOTTLES DRAWN AEROBIC AND ANAEROBIC Blood Culture adequate volume   Culture  Setup Time   Final    GRAM POSITIVE COCCI IN PAIRS IN CHAINS GRAM NEGATIVE RODS AEROBIC BOTTLE ONLY CRITICAL RESULT CALLED TO, READ BACK BY AND VERIFIED WITH: A PHAM,PHARMD AT 1437 04/05/17 BY L BENFIELD Performed at [facility], [place] [address], [place], [place] [address]    Culture (A)  Final    PROTEUS MIRABILIS GROUP B STREP(S.AGALACTIAE)ISOLATED    Report Status 04/09/2017 FINAL  Final   Organism ID, Bacteria PROTEUS MIRABILIS  Final   Organism ID, Bacteria GROUP B STREP(S.AGALACTIAE)ISOLATED  Final      Susceptibility   Group b strep(s.agalactiae)isolated - MIC*    CLINDAMYCIN >=1 RESISTANT Resistant     AMPICILLIN <=0.25 SENSITIVE  Sensitive     ERYTHROMYCIN >=8 RESISTANT Resistant     VANCOMYCIN 0.5 SENSITIVE Sensitive     CEFTRIAXONE <=0.12 SENSITIVE Sensitive     LEVOFLOXACIN 1 SENSITIVE Sensitive     * GROUP B STREP(S.AGALACTIAE)ISOLATED   Proteus mirabilis - MIC*    AMPICILLIN <=2 SENSITIVE Sensitive     CEFAZOLIN 8 SENSITIVE Sensitive     CEFEPIME <=1 SENSITIVE Sensitive     CEFTAZIDIME <=1 SENSITIVE Sensitive     CEFTRIAXONE <=1 SENSITIVE Sensitive     CIPROFLOXACIN 0.5 SENSITIVE Sensitive     GENTAMICIN <=1 SENSITIVE Sensitive     IMIPENEM 4 SENSITIVE Sensitive     TRIMETH/SULFA <=20 SENSITIVE Sensitive     AMPICILLIN/SULBACTAM <=2 SENSITIVE Sensitive     PIP/TAZO <=4 SENSITIVE Sensitive     * PROTEUS MIRABILIS  Blood Culture ID Panel (Reflexed)     Status: Abnormal   Collection Time: 04/04/17 12:42 PM  Result Value Ref Range Status   Enterococcus species NOT DETECTED NOT DETECTED Final   Listeria monocytogenes NOT DETECTED NOT DETECTED Final   Staphylococcus species NOT DETECTED NOT DETECTED Final   Staphylococcus aureus NOT DETECTED NOT DETECTED Final   Streptococcus species DETECTED (A) NOT DETECTED Final    Comment: CRITICAL RESULT CALLED TO, READ BACK BY AND VERIFIED WITH: A PHAM,PHARMD AT 1437 04/05/17 BY L BENFIELD    Streptococcus agalactiae DETECTED (A) NOT DETECTED Final    Comment: CRITICAL RESULT CALLED TO, READ BACK BY AND VERIFIED WITH: A PHAM,PHARMD AT 1437 04/05/17 BY L BENFIELD    Streptococcus pneumoniae NOT DETECTED NOT DETECTED Final   Streptococcus pyogenes NOT DETECTED NOT DETECTED Final   Acinetobacter baumannii NOT DETECTED NOT DETECTED Final   Enterobacteriaceae species DETECTED (A) NOT DETECTED Final    Comment: Enterobacteriaceae represent a large family of gram-negative bacteria, not a single organism.  CRITICAL RESULT CALLED TO, READ BACK BY AND VERIFIED WITH: A PHAM,PHARMD AT 1437 04/05/17 BY L BENFIELD    Enterobacter cloacae complex NOT DETECTED NOT DETECTED Final    Escherichia coli NOT DETECTED NOT DETECTED Final   Klebsiella oxytoca NOT DETECTED NOT DETECTED Final   Klebsiella pneumoniae NOT DETECTED NOT DETECTED Final   Proteus species DETECTED (A) NOT DETECTED Final    Comment: CRITICAL RESULT CALLED TO, READ BACK BY AND VERIFIED WITH: A PHAM,PHARMD AT 1437 04/05/17 BY L BENFIELD    Serratia marcescens NOT DETECTED NOT DETECTED Final   Carbapenem resistance NOT DETECTED NOT DETECTED Final   Haemophilus influenzae NOT DETECTED NOT DETECTED Final   Neisseria meningitidis NOT DETECTED NOT DETECTED Final   Pseudomonas aeruginosa NOT DETECTED NOT DETECTED Final   Candida albicans NOT DETECTED NOT DETECTED Final   Candida glabrata NOT DETECTED NOT DETECTED Final   Candida krusei NOT DETECTED NOT DETECTED Final   Candida parapsilosis NOT DETECTED NOT DETECTED Final   Candida tropicalis NOT DETECTED NOT DETECTED Final    Comment: Performed at [facility], [place] [address] [place], [place] [address]  Urine culture     Status: Abnormal   Collection Time: 04/04/17  3:43 PM  Result Value Ref Range Status   Specimen Description URINE, CATHETERIZED  Final   Special Requests NONE  Final   Culture MULTIPLE ORGANISMS PRESENT, NONE PREDOMINANT (A)  Final   Report Status 04/05/2017 FINAL  Final  Culture, blood (Routine x 2)     Status: None   Collection Time: 04/04/17  6:54 PM  Result Value Ref Range Status   Specimen Description BLOOD LEFT ANTECUBITAL  Final   Special Requests IN PEDIATRIC BOTTLE Blood Culture adequate volume  Final   Culture   Final    NO GROWTH 5 DAYS Performed at [facility], [place] [address], [place], [place] [address]    Report Status 04/09/2017 FINAL  Final  MRSA PCR Screening     Status: None   Collection Time: 04/04/17  6:58 PM  Result Value Ref Range Status   MRSA by PCR NEGATIVE NEGATIVE Final    Comment:        The GeneXpert MRSA Assay (FDA approved for NASAL specimens only), is one component of a comprehensive  MRSA colonization surveillance program. It is not intended to diagnose MRSA infection nor to guide or monitor treatment for MRSA infections.   Rapid strep screen (not at [facility])     Status: None   Collection Time: 04/09/17  9:18 AM  Result Value Ref Range Status   Streptococcus, Group A Screen (Direct) NEGATIVE NEGATIVE Final    Comment: (NOTE) A Rapid Antigen test may result negative if the antigen level in the sample is below the detection level of this test. The FDA has not cleared this test as a stand-alone test therefore the rapid antigen negative result has reflexed to a Group A Strep culture.   Culture, group A strep     Status: None   Collection Time: 04/09/17  9:18 AM  Result Value Ref Range Status   Specimen Description THROAT  Final   Special Requests NONE Reflexed from [id number]  Final   Culture   Final    NO GROUP A STREP (S.PYOGENES) ISOLATED Performed at [facility], [address] [place], [place] [address]    Report Status 04/11/2017 FINAL  Final  Culture, blood (routine x 2)     Status: None (Preliminary  result)   Collection Time: 04/09/17  9:46 AM  Result Value Ref Range Status   Specimen Description BLOOD LEFT ARM  Final   Special Requests IN PEDIATRIC BOTTLE Blood Culture adequate volume  Final   Culture   Final    NO GROWTH 4 DAYS Performed at [facility], [place] [address], [place], [place] [address]    Report Status PENDING  Incomplete  Culture, blood (routine x 2)     Status: Abnormal   Collection Time: 04/09/17  9:46 AM  Result Value Ref Range Status   Specimen Description BLOOD RIGHT ARM  Final   Special Requests IN PEDIATRIC BOTTLE Blood Culture adequate volume  Final   Culture  Setup Time   Final    GRAM POSITIVE COCCI IN CLUSTERS IN PEDIATRIC BOTTLE CRITICAL RESULT CALLED TO, READ BACK BY AND VERIFIED WITH: A. Pham Pharm.D. 12:10 04/10/17 ([email or web address])    Culture (A)  Final    STAPHYLOCOCCUS SPECIES (COAGULASE NEGATIVE) THE SIGNIFICANCE  OF ISOLATING THIS ORGANISM FROM A SINGLE SET OF BLOOD CULTURES WHEN MULTIPLE SETS ARE DRAWN IS UNCERTAIN. PLEASE NOTIFY THE MICROBIOLOGY DEPARTMENT WITHIN ONE WEEK IF SPECIATION AND SENSITIVITIES ARE REQUIRED. Performed at [facility], [place] [address], [place], [place] [address]    Report Status 04/12/2017 FINAL  Final  Blood Culture ID Panel (Reflexed)     Status: Abnormal   Collection Time: 04/09/17  9:46 AM  Result Value Ref Range Status   Enterococcus species NOT DETECTED NOT DETECTED Final   Listeria monocytogenes NOT DETECTED NOT DETECTED Final   Staphylococcus species DETECTED (A) NOT DETECTED Final    Comment: Methicillin (oxacillin) resistant coagulase negative staphylococcus. Possible blood culture contaminant (unless isolated from more than one blood culture draw or clinical case suggests pathogenicity). No antibiotic treatment is indicated for blood  culture contaminants. CRITICAL RESULT CALLED TO, READ BACK BY AND VERIFIED WITH: A. Pham Pharm.D. 12:10 04/10/17 ([email or web address])    Staphylococcus aureus NOT DETECTED NOT DETECTED Final   Methicillin resistance DETECTED (A) NOT DETECTED Final    Comment: CRITICAL RESULT CALLED TO, READ BACK BY AND VERIFIED WITH: A. Pham Pharm.D. 12:10 04/10/17 ([email or web address])    Streptococcus species NOT DETECTED NOT DETECTED Final   Streptococcus agalactiae NOT DETECTED NOT DETECTED Final   Streptococcus pneumoniae NOT DETECTED NOT DETECTED Final   Streptococcus pyogenes NOT DETECTED NOT DETECTED Final   Acinetobacter baumannii NOT DETECTED NOT DETECTED Final   Enterobacteriaceae species NOT DETECTED NOT DETECTED Final   Enterobacter cloacae complex NOT DETECTED NOT DETECTED Final   Escherichia coli NOT DETECTED NOT DETECTED Final   Klebsiella oxytoca NOT DETECTED NOT DETECTED Final   Klebsiella pneumoniae NOT DETECTED NOT DETECTED Final   Proteus species NOT DETECTED NOT DETECTED Final   Serratia marcescens NOT DETECTED NOT DETECTED Final    Haemophilus influenzae NOT DETECTED NOT DETECTED Final   Neisseria meningitidis NOT DETECTED NOT DETECTED Final   Pseudomonas aeruginosa NOT DETECTED NOT DETECTED Final   Candida albicans NOT DETECTED NOT DETECTED Final   Candida glabrata NOT DETECTED NOT DETECTED Final   Candida krusei NOT DETECTED NOT DETECTED Final   Candida parapsilosis NOT DETECTED NOT DETECTED Final   Candida tropicalis NOT DETECTED NOT DETECTED Final    Comment: Performed at [facility], [place] [address] [place], [place] [address]     Radiology Studies: Dg Chest Port 1 View  Result Date: 04/12/2017 CLINICAL DATA:  Shortness of breath today EXAM: PORTABLE CHEST 1 VIEW COMPARISON:  04/07/2017 FINDINGS: Cardiomegaly with vascular congestion. Left lower lobe atelectasis or infiltrate is similar to prior study. Probable small left effusion. No overt edema. No acute bony abnormality. IMPRESSION: Small left effusion with left lower lobe atelectasis or infiltrate. Cardiomegaly. Electronically Signed   By: Rolm Baptise M.D.   On: 04/12/2017 09:15    Scheduled Meds: . acidophilus  2 capsule Oral TID  . aspirin EC  81 mg Oral Daily  . cephALEXin  500 mg Oral Q8H  . enoxaparin (LOVENOX) injection  40 mg Subcutaneous Q24H  . famotidine  20 mg Oral BID  . feeding supplement (ENSURE ENLIVE)  237 mL Oral BID BM  . fluticasone  2 spray Each Nare Daily  . insulin aspart  0-15 Units Subcutaneous TID WC  . insulin aspart  0-5 Units Subcutaneous QHS  . mouth rinse  15 mL Mouth Rinse BID  . metroNIDAZOLE  500 mg Oral Q8H  . midodrine  2.5 mg Oral BID WC  . multivitamin with minerals  1 tablet Oral Daily  . potassium chloride  10 mEq Oral Daily  . senna-docusate  1 tablet Oral QHS  . sodium chloride flush  3 mL Intravenous Q12H   Continuous Infusions:    LOS: 10 days    Time spent >80mins  Sinclaire Artiga, MD PhD [facility] Pager [phone]  If 7PM-7AM, please contact night-coverage [email or web address] Password  [facility] 04/14/2017, 8:49 AM

## 2017-04-14 NOTE — Progress Notes (Signed)
Progress Note  Patient Name: Xela Oregel Date of Encounter: 04/14/2017  Primary Cardiologist: New (Nahser)  Subjective   Has been hypotensive early this morning and continues to be hypotensive.   Vitals:   04/14/17 0400 04/14/17 0543  BP: (!) 66/36 (!) 83/56  Pulse: 96 (!) 104  Resp: (!) 24 (!) 26  Temp: 99.4 F (37.4 C)      Inpatient Medications    Scheduled Meds: . acidophilus  2 capsule Oral TID  . aspirin EC  81 mg Oral Daily  . cephALEXin  500 mg Oral Q8H  . enoxaparin (LOVENOX) injection  40 mg Subcutaneous Q24H  . famotidine  20 mg Oral BID  . feeding supplement (ENSURE ENLIVE)  237 mL Oral BID BM  . fluticasone  2 spray Each Nare Daily  . insulin aspart  0-15 Units Subcutaneous TID WC  . insulin aspart  0-5 Units Subcutaneous QHS  . mouth rinse  15 mL Mouth Rinse BID  . metroNIDAZOLE  500 mg Oral Q8H  . midodrine  2.5 mg Oral BID WC  . multivitamin with minerals  1 tablet Oral Daily  . polyethylene glycol  17 g Oral Daily  . potassium chloride  10 mEq Oral Daily  . senna-docusate  1 tablet Oral BID  . sodium chloride flush  3 mL Intravenous Q12H   Continuous Infusions:  PRN Meds: acetaminophen **OR** acetaminophen, iopamidol, metoprolol tartrate, naproxen sodium, ondansetron **OR** ondansetron (ZOFRAN) IV, traMADol-acetaminophen   Vital Signs    Vitals:   04/14/17 0000 04/14/17 0400 04/14/17 0500 04/14/17 0543  BP: (!) 95/47 (!) 66/36  (!) 83/56  Pulse: 96 96  (!) 104  Resp: (!) 29 (!) 24  (!) 26  Temp:  99.4 F (37.4 C)    TempSrc:  Oral    SpO2: 100% 99%  (!) 86%  Weight:   159 lb 2.8 oz (72.2 kg)   Height:        Intake/Output Summary (Last 24 hours) at 04/14/17 0746 Last data filed at 04/14/17 0600  Gross per 24 hour  Intake              954 ml  Output             1700 ml  Net             -746 ml   Filed Weights   04/11/17 0456 04/12/17 0343 04/14/17 0500  Weight: 166 lb 3.6 oz (75.4 kg) 165 lb 12.6 oz (75.2 kg) 159 lb 2.8 oz  (72.2 kg)    Telemetry    Sinus tachycardia, sinus rhythm.  No events. - Personally Reviewed   Physical Exam   GEN: chronically ill appearing, no acute distress. HEENT: Hoarseness.  Neck: no JVD,  Cardiac: tachycardic. no murmurs, rubs, or gallops,no edema. Intact distal pulses bilaterally.  Respiratory: clear to auscultation bilaterally GI: soft, nontender, nondistended, MS: no deformity or atrophy  Skin: warm. No deformity Neuro: nonfocal Psych:   Normal affect.  Labs    Chemistry Recent Labs Lab 04/12/17 0334 04/13/17 0330 04/14/17 0321  NA 136 135 135  K 4.3 4.2 4.0  CL 93* 94* 94*  CO2 31 33* 32  GLUCOSE 133* 143* 113*  BUN 22* 19 23*  CREATININE 0.56 0.48 0.58  CALCIUM 9.8 9.9 9.7  GFRNONAA >60 >60 >60  GFRAA >60 >60 >60  ANIONGAP 12 8 9      Hematology Recent Labs Lab 04/12/17 0334 04/13/17 0330 04/14/17 0321  WBC  15.3* 16.3* 12.5*  RBC 4.69 4.17 3.74*  HGB 11.1* 9.9* 9.2*  HCT 36.4 32.3* 29.0*  MCV 77.6* 77.5* 77.5*  MCH 23.7* 23.7* 24.6*  MCHC 30.5 30.7 31.7  RDW 16.8* 16.9* 17.0*  PLT 456* 403* 352    Cardiac EnzymesNo results for input(s): TROPONINI in the last 168 hours. No results for input(s): TROPIPOC in the last 168 hours.   BNP Recent Labs Lab 04/12/17 0334  BNP 1,317.3*     DDimer No results for input(s): DDIMER in the last 168 hours.   Radiology    Dg Chest Port 1 View  Result Date: 04/12/2017 CLINICAL DATA:  Shortness of breath today EXAM: PORTABLE CHEST 1 VIEW COMPARISON:  04/07/2017 FINDINGS: Cardiomegaly with vascular congestion. Left lower lobe atelectasis or infiltrate is similar to prior study. Probable small left effusion. No overt edema. No acute bony abnormality. IMPRESSION: Small left effusion with left lower lobe atelectasis or infiltrate. Cardiomegaly. Electronically Signed   By: Rolm Baptise M.D.   On: 04/12/2017 09:15    Cardiac Studies   Echo 04/09/17: Study Conclusions  - Left ventricle: Septal and  apical akinesis. Inferior and distal anterior wall hypokinesis. The cavity size was severely dilated. Wall thickness was increased in a pattern of moderate LVH. Systolic function was severely reduced. The estimated ejection fraction was in the range of 25% to 30%. - Aortic valve: There was trivial regurgitation. - Left atrium: The atrium was mildly dilated. - Atrial septum: No defect or patent foramen ovale was identified. - Pulmonary arteries: PA peak pressure: 33 mm Hg (S). - Pericardium, extracardiac: Small but nearly circumferential pericardial effusion no tamponade IVC small  Patient Profile     66 y.o. female with chronic systolic and diastolic heart failure, hypertension, paraplegia, neurogenic bladder, sacral decubits ulcer, morbid obesity, and colostomy bag here with sepsis who developed acute on chronic heart failure and bradycardia.   Assessment & Plan    1. Acute on chronic systolic and diastolic heart failure: Hypotension and tachycardia likely due to sepsis (Per Dr. Oval Linsey) and not related to heart failure. Continue to hold spironolactone as well as torsemide due to hypotension.  -- Continue to hold BB (Hypotension/bradycardia) -- Dr. Oval Linsey recommends Levophed is pressures are needed and felt that CVP would be helpful.   2. Bradycardia: Ms Tyltell had a 14 second pause while swallowing pills on 7/10. She was also noted to be bradycardic with HR in the 30-40s. Metoprolo stopped and switched to Carvedilol but the pauses persisted. She is now tachycardic in the 110s which Dr. Oval Linsey attributes to infection and aggressive diuresis. Continue to hold diuretics.    Signed, Linus Mako, PA-C  04/14/2017, 7:46 AM     History and all data above reviewed.  Patient examined.  I agree with the findings as above.  No evidence of further bradycardia.   Still hypotensive.  Holding all of the antihypertensives and AV nodal blocking drugs.  The patient exam reveals  COR:RRR  ,  Lungs: Clear  ,  Abd: Positive bowel sounds, no rebound no guarding , Ext Mild edema  .  All available labs, radiology testing, previous records reviewed. Agree with documented assessment and plan. Hypotension:  Not thought to be cardiogenic.  She is on midodrine and we will follow this.  Holding diuresis for now as this likely plays a role.  Bradycardia:  No further bradyarrhythmias.  The 14 second pause was provoked.  No change in therapy.  We might be able to restart  low dose beta blocker in the future as her BP recovers.    Cordelle Dahmen  1:24 PM  04/14/2017

## 2017-04-14 NOTE — Progress Notes (Signed)
PT Cancellation Note / Screen  Patient Details Name: Sharon Warner MRN: [id number] DOB: [birth year]     Reason Eval/Treat Not Completed: PT screened, no needs identified, will sign off Per MD discharge summary: "Patient is transfer to long term care facility at [facility] for continued medical care, she needs to be followed up by cardiology, she needs have aggressive wound care (hydrotherapy), she need to be followed by infectious disease." Hydrotherapy order received however since pt is set to d/c, will defer hydrotherapy to LTAC as this would be most efficient management of wound at this time.  Mattheo Swindle,KATHrine E 04/14/2017, 12:56 PM Carmelia Bake, PT, DPT 04/14/2017 Pager: [phone]

## 2017-04-14 NOTE — Progress Notes (Signed)
Pt. remains on 2 lpm n/c during day and h/s, aware to notify if wanting CPAP, RT to monitor.

## 2017-04-15 LAB — CBC WITH DIFFERENTIAL/PLATELET
BASOS PCT: 0 %
Basophils Absolute: 0 10*3/uL (ref 0.0–0.1)
EOS ABS: 0.1 10*3/uL (ref 0.0–0.7)
Eosinophils Relative: 1 %
HCT: 26.3 % — ABNORMAL LOW (ref 36.0–46.0)
HEMOGLOBIN: 8.1 g/dL — AB (ref 12.0–15.0)
Lymphocytes Relative: 26 %
Lymphs Abs: 2.9 10*3/uL (ref 0.7–4.0)
MCH: 23.6 pg — ABNORMAL LOW (ref 26.0–34.0)
MCHC: 30.8 g/dL (ref 30.0–36.0)
MCV: 76.7 fL — ABNORMAL LOW (ref 78.0–100.0)
MONOS PCT: 9 %
Monocytes Absolute: 1 10*3/uL (ref 0.1–1.0)
NEUTROS PCT: 64 %
Neutro Abs: 7.4 10*3/uL (ref 1.7–7.7)
Platelets: 269 10*3/uL (ref 150–400)
RBC: 3.43 MIL/uL — ABNORMAL LOW (ref 3.87–5.11)
RDW: 17 % — ABNORMAL HIGH (ref 11.5–15.5)
WBC: 11.4 10*3/uL — AB (ref 4.0–10.5)

## 2017-04-15 LAB — CULTURE, BLOOD (ROUTINE X 2): Special Requests: ADEQUATE

## 2017-04-15 LAB — BASIC METABOLIC PANEL
Anion gap: 7 (ref 5–15)
BUN: 25 mg/dL — ABNORMAL HIGH (ref 6–20)
CALCIUM: 9.3 mg/dL (ref 8.9–10.3)
CHLORIDE: 96 mmol/L — AB (ref 101–111)
CO2: 31 mmol/L (ref 22–32)
CREATININE: 0.48 mg/dL (ref 0.44–1.00)
Glucose, Bld: 102 mg/dL — ABNORMAL HIGH (ref 65–99)
Potassium: 3.9 mmol/L (ref 3.5–5.1)
SODIUM: 134 mmol/L — AB (ref 135–145)

## 2017-04-15 LAB — URINE CULTURE

## 2017-04-15 LAB — GLUCOSE, CAPILLARY
GLUCOSE-CAPILLARY: 94 mg/dL (ref 65–99)
GLUCOSE-CAPILLARY: 123 mg/dL — AB (ref 65–99)
GLUCOSE-CAPILLARY: 111 mg/dL — AB (ref 65–99)
Glucose-Capillary: 113 mg/dL — ABNORMAL HIGH (ref 65–99)

## 2017-04-15 LAB — MAGNESIUM: MAGNESIUM: 1.9 mg/dL (ref 1.7–2.4)

## 2017-04-15 MED ORDER — MIDODRINE HCL 5 MG PO TABS
2.5000 mg | ORAL_TABLET | Freq: Three times a day (TID) | ORAL | Status: DC
  Administered 2017-04-15 – 2017-04-16 (×2): 2.5 mg via ORAL
  Filled 2017-04-15 (×2): qty 1

## 2017-04-15 NOTE — Progress Notes (Signed)
Nutrition Follow-up  DOCUMENTATION CODES:   Obesity unspecified  INTERVENTION:  - Continue to encourage PO intakes of meals and supplements. - Continue Ensure Enlive BID during hospitalization; recommend this supplement or comparable supplement at [facility].  NUTRITION DIAGNOSIS:   Increased nutrient needs related to wound healing as evidenced by estimated needs. -ongoing  GOAL:   Patient will meet greater than or equal to 90% of their needs -variably met.   MONITOR:   PO intake, Supplement acceptance, Labs, Weight trends, Skin, I & O's  ASSESSMENT:   66 y.o. female with Past medical history of paraplegia, neurogenic bladder, wheelchair-bound, chronic indwelling Foley catheter, sacral decubitus ulcer, morbid obesity, HTN, type II DM, sacral osteomyelitis, chronic diastolic CHF.  7/17 Per chart review, pt consumed 75% of breakfast and 50% of lunch on 7/13; 100% of breakfast and 35% of dinner on 7/15. Pt's appetite has continued to improve from previous assessment to today. She continues to drink 1-2 bottles of Ensure Enlive/day. Weight was initially trending up but is now trending back down toward admission weight. Current weight is +2.1 kg from admission. Surgery note from yesterday states pt stable for d/c to LTAC. Discharge order, but not d/c summary, currently in place.  Medications reviewed; 2 capsules Risaquad TID, 20 mg oral Pepcid BID, sliding scale Novolog, daily multivitamin with minerals, 10 mEq oral KCl/day, 1 tablet Senokot/day. Labs reviewed; CBG: 94 mg/dL this AM, Na: 134 mmol/L, Cl: 96 mmol/L, BUN: 25 mg/dL.   7/11 - Pt consumed 10% of breakfast yesterday morning.  - No other intakes documented since previous assessment.  - Pt reports that she consumed 50% of breakfast this AM which consisted of corn flakes with milk, fruit cup, and another item which RD was unable to understand.  - Pt continues to have hoarse voice/loss of voice and mouthed words to communicate.  -  She states appetite and intakes have been improving over the past 24 hours.  Sharon Warner taste is no longer an issue.  - Weight was trending up 7/6-7/9 and is now trending back down; current weight is +5.7 kg from admission weight.  - Pt confirms that she is drinking at least 1 bottle of Ensure/day.   7/9 - Pt with hoarse voice.  - Reports poor appetite which started not that long before she was admitted.  - Denies issues with chewing or swallowing food.  - States foods taste bland to her.  - Pt was ordered an omelet for breakfast but she didn't eat it d/t it not having any "seasoning".  - PO intakes have ranged from 40-75% since admission.  - She drinks Ensure supplements at home. RD will order supplement and MVI d/t stage IV wound. - Per chart review, pt has lost 68 lb since 06/27/16 (30% wt loss x 9 months, significant for time frame).  - Nutrition focused physical exam shows no sign of depletion of muscle mass or body fat. - Pt is wheelchair bound and paraplegic.    Diet Order:  Diet Carb Modified Fluid consistency: Thin; Room service appropriate? Yes Diet - low sodium heart healthy  Skin:  Wound (see comment) (Stage 4 sacral pressure injury)  Last BM:  7/17  Height:   Ht Readings from Last 1 Encounters:  04/04/17 '5\' 1"'  (1.549 m)    Weight:   Wt Readings from Last 1 Encounters:  04/15/17 162 lb 11.2 oz (73.8 kg)    Ideal Body Weight:  45.3 kg (adjusted for paraplegia)  BMI:  Body mass index  is 30.74 kg/m.  Estimated Nutritional Needs:   Kcal:  2000-2200  Protein:  100-110g  Fluid:  2L/day  EDUCATION NEEDS:   Education needs addressed    Jarome Matin, MS, RD, LDN, CNSC Inpatient Clinical Dietitian Pager # [phone] After hours/weekend pager # [phone]

## 2017-04-15 NOTE — Progress Notes (Signed)
PROGRESS NOTE    Sharon Warner  RSW:[id number] DOB: 1951-07-02 DOA: 04/04/2017 PCP: [name], MD    Brief Narrative:  66 y.o. female with Past medical history of paraplegia, neurogenic bladder, wheelchair-bound, chronic indwelling Foley catheter, sacral decubitus ulcer, morbid obesity, HTN, type II DM, sacral osteomyelitis, chronic diastolic CHF. Patient presented with complains of fever. In her usual health yesterday. This morning when she woke up and the home health nurse came to see her she had a temp of 101.4 and she was asked to go to ER.   ED Course: Presented with fever, was febrile with tachycardia, UA was showing pyuria and patient was started on antibiotics and was referred for admission Blood culture + proteus +group b strep Ct pel with 4cmx2cm fluids collection,  General surgery/ortho/IR /ID consulted Plan to discharge to select to get hydrotherapy and try prolonged course of hydrotherapy  She also developed tachy/brady syndrome and chf exacerbation, currently all diuretic and betablocker held, she is on midorine.  cardiology to continue follow patient at [facility] , need to continue adjust meds,  Discharge to [facility] has been cancelled twice due to awaiting insurance approval  Assessment & Plan:   Principal Problem:   Streptococcal bacteremia Active Problems:   Morbid obesity ([place])   Type 2 diabetes mellitus (HCC)   HTN (hypertension)   Paraplegia (HCC)   Neurogenic bladder   Obstructive sleep apnea   Microcytic anemia   Protein-calorie malnutrition (HCC)   Multinodular goiter   Hypercalcemia   Sacral decubitus ulcer, stage IV (HCC)   Acute on chronic combined systolic and diastolic CHF (congestive heart failure) (HCC)   Sacroiliitis (HCC)   Gram-negative bacteremia  sepsis present on admission [facility]), bacteremia -Pt presents with fever, leukocytosis, sinus tachycardia and evidence of pyuria/( urine was cloudy with sediment initial on presentation). uti thought  was the cause of sepsis and bacteremia, foley cath changed at time of admission -however urine culture no growth, blood culture +pos for proteus and strep species which is consistent with culture from Last year in June 2017 when patient had polymicrobial bacteremia as well as sacral osteomyelitis and was treated with ceftriaxonex6weeks. -I have discussed the case with infectious disease Dr Linus Salmons on 7/13 who recommend CT pel and general surgery consult for possible wound debridment -Ct pel with possible left si joint septic arthritis with fluids collection 4cmx2cm anterior to the joint, I have discussed with oncall orthopedic surgery Dr Raphael Gibney on 7/14 who recommended IR and formal ID consult, per ortho should patient need ortho surgery in the future for SI Joint, patient need to be referred to [facility] center. -I discussed with case with IR Dr Annamaria Boots who do not feel aspiration will change management, he recommended infectious disease consult. -general surgery and wound care consulted for wound care, start hydrotherapy per general surgery recommendations. -infectious disease Dr Megan Salon consulted, after discussion with patient about long term iv abx treatment vs trial of prolonged course of oral abx therapy , patient decided to try oral therapy, abx changed to keflex and flagyl from 7/14, please see id consult note from 7/14 for details -will need LTAC /SNF for hydrotherapy, continued care by infectious disease   Leukocytosis:wbc start to rise from 7/14 From over diuresis? From infection?  Repeat ua  On 7/15 and repeat blood culture on 7/16, no growth , lactic acid wnl  Hypotension:  From over diuresis? From infection? patient looks stable, am cortisol level unremarkable Trial of midodrine, she received small fluids bolus 250cc x2 on  7/16, she does not have edema, on exam euvolemic to slightly dry   Sinus tachycardia/bradycardia. -Pt presented with sinus tachycardia in the range of 130s and 140s  iniitally on admission -Likely sequela of sepsis versus rebound tachycardia after holding beta blocker secondary to hypertension -Tachycardia resolved with resumption of beta blocker, however , patient noted to be transiently profoundly bradycardic/sinus arrest  -keep mag>2, k >4, tsh 2.1, - cardiology consulted, now off betablocker , patient is deemed to be a poor candidate for pacemaker, meds adjustment per cardiology  acute on Chronic combined systolic and diastolic heart failure, acute hypoxic respiratory failure. --Patient had been on torsemide prior to hospital admission. -she received fluids resuscitation for sepsis initially, cxr with findings of developing pulmonary edema -echo with reduced ef 25-30% - cardiology consulted,  Currently off betablocker, arb, on diuretics only. meds adjustment per cardiology. Weaned off oxygen.   Hypokalemia/hypomagnesemia: replace k/mag prn , keep K.4, mag>2   Hypercalcemia. -ca 11.3 on admission -Etiology remains unclear. -Home vitamin D supplementation on hold -Calcium levels have normalized  Type 2 diabetes mellitus. -On sensitive sliding scale. -Not on diabetic medications prior to admission -Labs reviewed. Currently stable   OSA. -Continue CPAP Daily at bedtime. -Currently stable  Hoarse voice -Unclear etiology -Pt denies coughing, no sore throat, no n/v -Question laryngitis  --soft tissue neck CT no acute findings, rapid strep test negative -resolved.  Bedbound,  paraplegia,  Chronic stage 4 pressure injury to sacrum presented on admission, s/p colostomy and indwelling foley (get changed Q month) Wound care consulted  DVT prophylaxis: Lovenox subcutaneous (wonder if patient needs chronic lovenox due to baseline bedbound status) Code Status: Full code Family Communication: Patient Disposition Plan: remain in stepdown due to tachy/brady  Per husband, they relocated from [place] to [place] since 02/2016, the second day  they arrived to [place], patient end up admitted to [facility] due to sepsis/bacteremia, patient has since been in different LTAC and SNF, she has not been able to stay home for more than a month before she was brought back to [facility] and admitted with sepsis on 7/6  Awaiting insurance approval to  LTAC  For aggressive wound care, hydrotherapy (select preferred due to ongoing need of attention by cardiology and infectious disease)   Consultants:   Cardiology  Infectious disease   General surgery  Interventional radiology Dr Annamaria Boots over the phone on 7/14  Orthopedics surgery Dr Raphael Gibney over the phone on 7/14  Wound care  Procedures:   none  Antimicrobials: Anti-infectives    Start     Dose/Rate Route Frequency Ordered Stop   04/14/17 0000  cephALEXin (KEFLEX) 500 MG capsule     500 mg Oral Every 8 hours 04/14/17 1140 05/14/17 2359   04/14/17 0000  metroNIDAZOLE (FLAGYL) 500 MG tablet     500 mg Oral Every 8 hours 04/14/17 1140 05/14/17 2359   04/12/17 1400  cephALEXin (KEFLEX) capsule 500 mg     500 mg Oral Every 8 hours 04/12/17 1015     04/12/17 1400  metroNIDAZOLE (FLAGYL) tablet 500 mg     500 mg Oral Every 8 hours 04/12/17 1015     04/06/17 1400  cefTRIAXone (ROCEPHIN) 2 g in dextrose 5 % 50 mL IVPB  Status:  Discontinued     2 g 100 mL/hr over 30 Minutes Intravenous Every 24 hours 04/05/17 1417 04/12/17 1015   04/05/17 1400  cefTRIAXone (ROCEPHIN) 1 g in dextrose 5 % 50 mL IVPB  Status:  Discontinued     1 g 100 mL/hr over 30 Minutes Intravenous Every 24 hours 04/04/17 1235 04/05/17 1417   04/04/17 1230  cefTRIAXone (ROCEPHIN) 2 g in dextrose 5 % 50 mL IVPB     2 g 100 mL/hr over 30 Minutes Intravenous  Once 04/04/17 1220 04/04/17 2213      Subjective:   No fever last 24hrs,  Less sinus tachycardia, no bradycardia last 24hrs She reports has a good night, this morning she feels "pretty good", her voice is back to normal.   Objective: Vitals:    04/15/17 0900 04/15/17 1150 04/15/17 1200 04/15/17 1600  BP:   (!) 96/57 (!) 85/46  Pulse: 89  (!) 109 (!) 105  Resp: (!) 26  (!) 31 (!) 27  Temp:  98.4 F (36.9 C)  98.6 F (37 C)  TempSrc:  Oral  Oral  SpO2: 94%  94% 93%  Weight:      Height:        Intake/Output Summary (Last 24 hours) at 04/15/17 1819 Last data filed at 04/15/17 1734  Gross per 24 hour  Intake             1730 ml  Output             1735 ml  Net               -5 ml   Filed Weights   04/12/17 0343 04/14/17 0500 04/15/17 0500  Weight: 75.2 kg (165 lb 12.6 oz) 72.2 kg (159 lb 2.8 oz) 73.8 kg (162 lb 11.2 oz)    Examination: General exam: chronically ill, Awake, laying in bed, in nad, hoarse voice has resolved. Respiratory system: decreased breath sounds at bilateral base,  no wheezing, no rales, no rhonchi Cardiovascular system: regular rate, s1, s2 Gastrointestinal system: Soft, nondistended, positive BS, + colostomy bag with soft stoll, +indwelling foley Central nervous system: chronic paraplegia, no sensation from waist down,  Extremities:+bilateral heal protection , no edema Skin: Chronic stage 4 pressure injury to sacrum presented on admission,  Psychiatry: Mood normal // no visual hallucinations    Data Reviewed: I have personally reviewed following labs and imaging studies  CBC:  Recent Labs Lab 04/09/17 0301 04/12/17 0334 04/13/17 0330 04/14/17 0321 04/15/17 0254  WBC 9.5 15.3* 16.3* 12.5* 11.4*  NEUTROABS  --  10.5* 11.9* 8.1* 7.4  HGB 9.8* 11.1* 9.9* 9.2* 8.1*  HCT 31.5* 36.4 32.3* 29.0* 26.3*  MCV 77.6* 77.6* 77.5* 77.5* 76.7*  PLT 311 456* 403* 352 951   Basic Metabolic Panel:  Recent Labs Lab 04/10/17 0316 04/11/17 0254 04/12/17 0334 04/13/17 0330 04/14/17 0321 04/15/17 0254  NA 142 137 136 135 135 134*  K 3.4* 3.5 4.3 4.2 4.0 3.9  CL 103 98* 93* 94* 94* 96*  CO2 30 31 31  33* 32 31  GLUCOSE 125* 122* 133* 143* 113* 102*  BUN 24* 21* 22* 19 23* 25*  CREATININE 0.66  0.53 0.56 0.48 0.58 0.48  CALCIUM 9.8 9.7 9.8 9.9 9.7 9.3  MG 2.2  --  2.1  --   --  1.9   GFR: Estimated Creatinine Clearance: 65.3 mL/min (by C-G formula based on SCr of 0.48 mg/dL). Liver Function Tests: No results for input(s): AST, ALT, ALKPHOS, BILITOT, PROT, ALBUMIN in the last 168 hours. No results for input(s): LIPASE, AMYLASE in the last 168 hours. No results for input(s): AMMONIA in the last 168 hours. Coagulation Profile: No results for input(s): INR, PROTIME in  the last 168 hours. Cardiac Enzymes: No results for input(s): CKTOTAL, CKMB, CKMBINDEX, TROPONINI in the last 168 hours. BNP (last 3 results) No results for input(s): PROBNP in the last 8760 hours. HbA1C: No results for input(s): HGBA1C in the last 72 hours. CBG:  Recent Labs Lab 04/14/17 1637 04/14/17 2205 04/15/17 0725 04/15/17 1132 04/15/17 1535  GLUCAP 140* 112* 94 123* 111*   Lipid Profile: No results for input(s): CHOL, HDL, LDLCALC, TRIG, CHOLHDL, LDLDIRECT in the last 72 hours. Thyroid Function Tests: No results for input(s): TSH, T4TOTAL, FREET4, T3FREE, THYROIDAB in the last 72 hours. Anemia Panel: No results for input(s): VITAMINB12, FOLATE, FERRITIN, TIBC, IRON, RETICCTPCT in the last 72 hours. Sepsis Labs:  Recent Labs Lab 04/09/17 0301 04/14/17 0321  PROCALCITON 1.36  --   LATICACIDVEN  --  0.7    Recent Results (from the past 240 hour(s))  Rapid strep screen (not at [facility])     Status: None   Collection Time: 04/09/17  9:18 AM  Result Value Ref Range Status   Streptococcus, Group A Screen (Direct) NEGATIVE NEGATIVE Final    Comment: (NOTE) A Rapid Antigen test may result negative if the antigen level in the sample is below the detection level of this test. The FDA has not cleared this test as a stand-alone test therefore the rapid antigen negative result has reflexed to a Group A Strep culture.   Culture, group A strep     Status: None   Collection Time: 04/09/17  9:18 AM    Result Value Ref Range Status   Specimen Description THROAT  Final   Special Requests NONE Reflexed from [id number]  Final   Culture   Final    NO GROUP A STREP (S.PYOGENES) ISOLATED Performed at [facility], [address] [place], [place] [address]    Report Status 04/11/2017 FINAL  Final  Culture, blood (routine x 2)     Status: None   Collection Time: 04/09/17  9:46 AM  Result Value Ref Range Status   Specimen Description BLOOD LEFT ARM  Final   Special Requests IN PEDIATRIC BOTTLE Blood Culture adequate volume  Final   Culture   Final    NO GROWTH 5 DAYS Performed at [facility], [place] [address] [place], [place] [address]    Report Status 04/14/2017 FINAL  Final  Culture, blood (routine x 2)     Status: Abnormal   Collection Time: 04/09/17  9:46 AM  Result Value Ref Range Status   Specimen Description BLOOD RIGHT ARM  Final   Special Requests IN PEDIATRIC BOTTLE Blood Culture adequate volume  Final   Culture  Setup Time   Final    GRAM POSITIVE COCCI IN CLUSTERS IN PEDIATRIC BOTTLE CRITICAL RESULT CALLED TO, READ BACK BY AND VERIFIED WITH: A. Pham Pharm.D. 12:10 04/10/17 ([email or web address]) Performed at [facility], [place] [address] [place], [place] [address]    Culture (A)  Final    STAPHYLOCOCCUS SPECIES (COAGULASE NEGATIVE) SPECIFICALLY  STAPHYLOCOCCUS EPIDERMIDIS    Report Status 04/15/2017 FINAL  Final  Blood Culture ID Panel (Reflexed)     Status: Abnormal   Collection Time: 04/09/17  9:46 AM  Result Value Ref Range Status   Enterococcus species NOT DETECTED NOT DETECTED Final   Listeria monocytogenes NOT DETECTED NOT DETECTED Final   Staphylococcus species DETECTED (A) NOT DETECTED Final    Comment: Methicillin (oxacillin) resistant coagulase negative staphylococcus. Possible blood culture contaminant (unless isolated from more  than one blood culture draw or clinical case suggests pathogenicity). No antibiotic treatment is indicated for blood  culture  contaminants. CRITICAL RESULT CALLED TO, READ BACK BY AND VERIFIED WITH: A. Pham Pharm.D. 12:10 04/10/17 ([email or web address])    Staphylococcus aureus NOT DETECTED NOT DETECTED Final   Methicillin resistance DETECTED (A) NOT DETECTED Final    Comment: CRITICAL RESULT CALLED TO, READ BACK BY AND VERIFIED WITH: A. Pham Pharm.D. 12:10 04/10/17 ([email or web address])    Streptococcus species NOT DETECTED NOT DETECTED Final   Streptococcus agalactiae NOT DETECTED NOT DETECTED Final   Streptococcus pneumoniae NOT DETECTED NOT DETECTED Final   Streptococcus pyogenes NOT DETECTED NOT DETECTED Final   Acinetobacter baumannii NOT DETECTED NOT DETECTED Final   Enterobacteriaceae species NOT DETECTED NOT DETECTED Final   Enterobacter cloacae complex NOT DETECTED NOT DETECTED Final   Escherichia coli NOT DETECTED NOT DETECTED Final   Klebsiella oxytoca NOT DETECTED NOT DETECTED Final   Klebsiella pneumoniae NOT DETECTED NOT DETECTED Final   Proteus species NOT DETECTED NOT DETECTED Final   Serratia marcescens NOT DETECTED NOT DETECTED Final   Haemophilus influenzae NOT DETECTED NOT DETECTED Final   Neisseria meningitidis NOT DETECTED NOT DETECTED Final   Pseudomonas aeruginosa NOT DETECTED NOT DETECTED Final   Candida albicans NOT DETECTED NOT DETECTED Final   Candida glabrata NOT DETECTED NOT DETECTED Final   Candida krusei NOT DETECTED NOT DETECTED Final   Candida parapsilosis NOT DETECTED NOT DETECTED Final   Candida tropicalis NOT DETECTED NOT DETECTED Final    Comment: Performed at [facility], [place] [address] [place], [place] [address]  Culture, Urine     Status: Abnormal   Collection Time: 04/13/17  7:26 AM  Result Value Ref Range Status   Specimen Description URINE, RANDOM  Final   Special Requests NONE  Final   Culture MULTIPLE SPECIES PRESENT, SUGGEST RECOLLECTION (A)  Final   Report Status 04/15/2017 FINAL  Final  Culture, blood (routine x 2)     Status: None (Preliminary result)   Collection Time:  04/13/17  7:40 AM  Result Value Ref Range Status   Specimen Description BLOOD LEFT HAND  Final   Special Requests IN PEDIATRIC BOTTLE Blood Culture adequate volume  Final   Culture   Final    NO GROWTH 2 DAYS Performed at [facility], [place] [address] [place], [place] [address]    Report Status PENDING  Incomplete  Culture, blood (routine x 2)     Status: Abnormal (Preliminary result)   Collection Time: 04/13/17  7:41 AM  Result Value Ref Range Status   Specimen Description BLOOD LEFT ANTECUBITAL  Final   Special Requests   Final    BOTTLES DRAWN AEROBIC ONLY Blood Culture adequate volume   Culture  Setup Time   Final    GRAM POSITIVE COCCI IN CLUSTERS AEROBIC BOTTLE ONLY CRITICAL RESULT CALLED TO, READ BACK BY AND VERIFIED WITH: A PHAM,PHARMD AT 04/14/17 BY L BENFIELD    Culture (A)  Final    STAPHYLOCOCCUS EPIDERMIDIS SUSCEPTIBILITIES TO FOLLOW Performed at [facility], [place] [address] [place], [place] [address]    Report Status PENDING  Incomplete  Culture, blood (routine x 2)     Status: None (Preliminary result)   Collection Time: 04/14/17  6:03 PM  Result Value Ref Range Status   Specimen Description BLOOD RIGHT HAND  Final   Special Requests   Final    BOTTLES DRAWN AEROBIC AND ANAEROBIC Blood Culture adequate  volume   Culture   Final    NO GROWTH < 24 HOURS Performed at [facility], [place] [address], [place], [place] [address]    Report Status PENDING  Incomplete  Culture, blood (routine x 2)     Status: None (Preliminary result)   Collection Time: 04/14/17  6:20 PM  Result Value Ref Range Status   Specimen Description BLOOD LEFT ARM  Final   Special Requests   Final    BOTTLES DRAWN AEROBIC AND ANAEROBIC Blood Culture adequate volume   Culture   Final    NO GROWTH < 24 HOURS Performed at [facility], [place] [address], [place], [place] [address]    Report Status PENDING  Incomplete     Radiology Studies: No results found.  Scheduled  Meds: . acidophilus  2 capsule Oral TID  . aspirin EC  81 mg Oral Daily  . cephALEXin  500 mg Oral Q8H  . enoxaparin (LOVENOX) injection  40 mg Subcutaneous Q24H  . famotidine  20 mg Oral BID  . feeding supplement (ENSURE ENLIVE)  237 mL Oral BID BM  . fluticasone  2 spray Each Nare Daily  . insulin aspart  0-15 Units Subcutaneous TID WC  . insulin aspart  0-5 Units Subcutaneous QHS  . mouth rinse  15 mL Mouth Rinse BID  . metroNIDAZOLE  500 mg Oral Q8H  . midodrine  2.5 mg Oral TID WC  . multivitamin with minerals  1 tablet Oral Daily  . potassium chloride  10 mEq Oral Daily  . senna-docusate  1 tablet Oral QHS  . sodium chloride flush  3 mL Intravenous Q12H   Continuous Infusions:    LOS: 11 days    Time spent >63mins  Niti Leisure, MD PhD [facility] Pager [phone]  If 7PM-7AM, please contact night-coverage [email or web address] Password [email or web address] 04/15/2017, 6:18 PM

## 2017-04-15 NOTE — Progress Notes (Signed)
Progress Note  Patient Name: Sharon Warner Date of Encounter: 04/15/2017  Primary Cardiologist:  New ([place])  Subjective   On midodrine, blood pressures better today but still soft. No further pauses on tele or documented. Plan is for discharge to [facility] for hydrotherapy and for [place] and I&D to continue to follow.  Vitals:   04/15/17 0400 04/15/17 0800  BP: (!) 92/42 (!) 89/46  Pulse: 83 84  Resp: (!) 22 (!) 26  Temp: 99.3 F (37.4 C) 98.9 F (37.2 C)     Inpatient Medications    Scheduled Meds: . acidophilus  2 capsule Oral TID  . aspirin EC  81 mg Oral Daily  . cephALEXin  500 mg Oral Q8H  . enoxaparin (LOVENOX) injection  40 mg Subcutaneous Q24H  . famotidine  20 mg Oral BID  . feeding supplement (ENSURE ENLIVE)  237 mL Oral BID BM  . fluticasone  2 spray Each Nare Daily  . insulin aspart  0-15 Units Subcutaneous TID WC  . insulin aspart  0-5 Units Subcutaneous QHS  . mouth rinse  15 mL Mouth Rinse BID  . metroNIDAZOLE  500 mg Oral Q8H  . midodrine  2.5 mg Oral BID WC  . multivitamin with minerals  1 tablet Oral Daily  . potassium chloride  10 mEq Oral Daily  . senna-docusate  1 tablet Oral QHS  . sodium chloride flush  3 mL Intravenous Q12H   Continuous Infusions:  PRN Meds: acetaminophen **OR** acetaminophen, iopamidol, metoprolol tartrate, naproxen sodium, ondansetron **OR** ondansetron (ZOFRAN) IV, traMADol-acetaminophen   Vital Signs    Vitals:   04/15/17 0000 04/15/17 0400 04/15/17 0500 04/15/17 0800  BP: (!) 80/46 (!) 92/42  (!) 89/46  Pulse: 83 83  84  Resp: (!) 29 (!) 22  (!) 26  Temp: 98.3 F (36.8 C) 99.3 F (37.4 C)  98.9 F (37.2 C)  TempSrc: Oral Oral  Oral  SpO2: 100% 100%  100%  Weight:   162 lb 11.2 oz (73.8 kg)   Height:        Intake/Output Summary (Last 24 hours) at 04/15/17 0943 Last data filed at 04/15/17 0800  Gross per 24 hour  Intake             1840 ml  Output             1260 ml  Net              580 ml    Filed Weights   04/12/17 0343 04/14/17 0500 04/15/17 0500  Weight: 165 lb 12.6 oz (75.2 kg) 159 lb 2.8 oz (72.2 kg) 162 lb 11.2 oz (73.8 kg)    Telemetry    Sinus tachycardia, sinus rhythm. No events - Personally Reviewed   Physical Exam   GEN: Chronically ill appearing, NAD HEENT: hoarseness Neck: no JVD Cardiac: RRR. no murmurs, rubs, or gallops,no edema Respiratory: clear to auscultation bilaterally GI: soft, nontender, nondistended, + BS MS: no deformity or atrophy  Skin: warm and dry, no rash Neuro: Alert and Oriented x 3, Strength and sensation are intact Psych:   Full affect  Labs    Chemistry Recent Labs Lab 04/13/17 0330 04/14/17 0321 04/15/17 0254  NA 135 135 134*  K 4.2 4.0 3.9  CL 94* 94* 96*  CO2 33* 32 31  GLUCOSE 143* 113* 102*  BUN 19 23* 25*  CREATININE 0.48 0.58 0.48  CALCIUM 9.9 9.7 9.3  GFRNONAA >60 >60 >60  GFRAA >60 >  60 >60  ANIONGAP 8 9 7      Hematology Recent Labs Lab 04/13/17 0330 04/14/17 0321 04/15/17 0254  WBC 16.3* 12.5* 11.4*  RBC 4.17 3.74* 3.43*  HGB 9.9* 9.2* 8.1*  HCT 32.3* 29.0* 26.3*  MCV 77.5* 77.5* 76.7*  MCH 23.7* 24.6* 23.6*  MCHC 30.7 31.7 30.8  RDW 16.9* 17.0* 17.0*  PLT 403* 352 269    Cardiac EnzymesNo results for input(s): TROPONINI in the last 168 hours. No results for input(s): TROPIPOC in the last 168 hours.   BNP Recent Labs Lab 04/12/17 0334  BNP 1,317.3*     DDimer No results for input(s): DDIMER in the last 168 hours.   Radiology    No results found.  Cardiac Studies    Echo 04/09/17: Study Conclusions  - Left ventricle: Septal and apical akinesis. Inferior and distal anterior wall hypokinesis. The cavity size was severely dilated. Wall thickness was increased in a pattern of moderate LVH. Systolic function was severely reduced. The estimated ejection fraction was in the range of 25% to 30%. - Aortic valve: There was trivial regurgitation. - Left atrium: The  atrium was mildly dilated. - Atrial septum: No defect or patent foramen ovale was identified. - Pulmonary arteries: PA peak pressure: 33 mm Hg (S). - Pericardium, extracardiac: Small but nearly circumferential pericardial effusion no tamponade IVC small   Patient Profile     66 y.o.femalewith chronic systolic and diastolic heart failure, hypertension, paraplegia, neurogenic bladder, sacral decubits ulcer, morbid obesity, and colostomy bag here with sepsis who developed acute on chronic heart failure and bradycardia.    Assessment & Plan    1. Acute on chronic systolic and diastolic heart failure: Hypotension and tachycardia likely due to sepsis and not related to heart failure (Per Dr. Percival Spanish). Continue to hold spironolactone, torsemide, and BB due to hypotension.  -- restart BB as BP allows. Currently requiring Midodrine.   2. Bradycardia: Ms Tyltell had a 14 second pause while swallowing pills on 7/10. No further episodes noted.   Signed, Linus Mako, PA-C  04/15/2017, 9:43 AM    History and all data above reviewed.  Patient examined.  I agree with the findings as above.  The patient exam reveals COR:RRR  ,  Lungs: clear  ,  Abd: Positive bowel sounds, no rebound no guarding;, Ext trace edema  .  All available labs, radiology testing, previous records reviewed. Agree with documented assessment and plan. Hypotension:  I will increase the midodrine.  Jeneen Rinks Maverick Patman  12:08 PM  04/15/2017

## 2017-04-16 ENCOUNTER — Inpatient Hospital Stay
Admission: AD | Admit: 2017-04-16 | Discharge: 2017-05-01 | Disposition: A | Discharge status: Skilled Nursing Facility | Payer: Self-pay | Source: Other Acute Inpatient Hospital | Attending: Internal Medicine | Admitting: Internal Medicine

## 2017-04-16 DIAGNOSIS — G8220 Paraplegia, unspecified: Secondary | ICD-10-CM

## 2017-04-16 DIAGNOSIS — D509 Iron deficiency anemia, unspecified: Secondary | ICD-10-CM

## 2017-04-16 DIAGNOSIS — N319 Neuromuscular dysfunction of bladder, unspecified: Secondary | ICD-10-CM

## 2017-04-16 DIAGNOSIS — R000 Tachycardia, unspecified: Secondary | ICD-10-CM

## 2017-04-16 DIAGNOSIS — M4698 Unspecified inflammatory spondylopathy, sacral and sacrococcygeal region: Secondary | ICD-10-CM

## 2017-04-16 DIAGNOSIS — I10 Essential (primary) hypertension: Secondary | ICD-10-CM

## 2017-04-16 DIAGNOSIS — E11622 Type 2 diabetes mellitus with other skin ulcer: Secondary | ICD-10-CM

## 2017-04-16 DIAGNOSIS — R069 Unspecified abnormalities of breathing: Secondary | ICD-10-CM

## 2017-04-16 DIAGNOSIS — R0689 Other abnormalities of breathing: Secondary | ICD-10-CM

## 2017-04-16 DIAGNOSIS — R0602 Shortness of breath: Secondary | ICD-10-CM

## 2017-04-16 DIAGNOSIS — G4733 Obstructive sleep apnea (adult) (pediatric): Secondary | ICD-10-CM

## 2017-04-16 DIAGNOSIS — A419 Sepsis, unspecified organism: Secondary | ICD-10-CM

## 2017-04-16 DIAGNOSIS — Z794 Long term (current) use of insulin: Secondary | ICD-10-CM

## 2017-04-16 DIAGNOSIS — I5043 Acute on chronic combined systolic (congestive) and diastolic (congestive) heart failure: Secondary | ICD-10-CM

## 2017-04-16 DIAGNOSIS — R1310 Dysphagia, unspecified: Secondary | ICD-10-CM

## 2017-04-16 LAB — CBC WITH DIFFERENTIAL/PLATELET
Basophils Absolute: 0 10*3/uL (ref 0.0–0.1)
Basophils Relative: 0 %
EOS ABS: 0.1 10*3/uL (ref 0.0–0.7)
EOS PCT: 1 %
HCT: 28.2 % — ABNORMAL LOW (ref 36.0–46.0)
Hemoglobin: 8.5 g/dL — ABNORMAL LOW (ref 12.0–15.0)
LYMPHS ABS: 3 10*3/uL (ref 0.7–4.0)
LYMPHS PCT: 27 %
MCH: 22.8 pg — AB (ref 26.0–34.0)
MCHC: 30.1 g/dL (ref 30.0–36.0)
MCV: 75.6 fL — AB (ref 78.0–100.0)
MONO ABS: 1.1 10*3/uL — AB (ref 0.1–1.0)
MONOS PCT: 10 %
Neutro Abs: 7.1 10*3/uL (ref 1.7–7.7)
Neutrophils Relative %: 62 %
PLATELETS: 307 10*3/uL (ref 150–400)
RBC: 3.73 MIL/uL — ABNORMAL LOW (ref 3.87–5.11)
RDW: 16.9 % — AB (ref 11.5–15.5)
WBC: 11.2 10*3/uL — AB (ref 4.0–10.5)

## 2017-04-16 LAB — BASIC METABOLIC PANEL
Anion gap: 8 (ref 5–15)
BUN: 21 mg/dL — AB (ref 6–20)
CALCIUM: 9.6 mg/dL (ref 8.9–10.3)
CO2: 30 mmol/L (ref 22–32)
CREATININE: 0.55 mg/dL (ref 0.44–1.00)
Chloride: 98 mmol/L — ABNORMAL LOW (ref 101–111)
GFR calc Af Amer: >60 mL/min (ref >60)
GLUCOSE: 112 mg/dL — AB (ref 65–99)
POTASSIUM: 3.8 mmol/L (ref 3.5–5.1)
SODIUM: 136 mmol/L (ref 135–145)

## 2017-04-16 LAB — CULTURE, BLOOD (ROUTINE X 2): Special Requests: ADEQUATE

## 2017-04-16 LAB — GLUCOSE, CAPILLARY
GLUCOSE-CAPILLARY: 108 mg/dL — AB (ref 65–99)
Glucose-Capillary: 93 mg/dL (ref 65–99)

## 2017-04-16 MED ORDER — MIDODRINE HCL 2.5 MG PO TABS: 2.5000 mg | ORAL_TABLET | Freq: Three times a day (TID) | ORAL | 0 refills | Status: DC

## 2017-04-16 NOTE — Progress Notes (Signed)
Date: April 16, 2017 Chart reviewed for discharge orders: None found for case management. Vernia Buff, [phone]

## 2017-04-16 NOTE — Discharge Summary (Signed)
Physician Discharge Summary  Sharon Warner KKX:[id number] DOB: Apr 09, 1951 DOA: 04/04/2017  PCP: [name], MD  Admit date: 04/04/2017 Discharge date: 04/16/2017  Admitted From: Home Disposition:  LTAC  Recommendations for Outpatient Follow-up:  1. Follow up with PCP in 1-2 weeks 2. Follow up with Wound Care at [facility] 3. Follow up with Cardiology at [facility] 4. Follow up with Hydrotherapy at [place] 5. Follow up with Infectious Diseases in 3-4 Weeks 6. Please obtain CMP/CBC, Mag, Phos in one week 7. Please follow up on the following pending results: Repeat Blood Cx Results drawn on 7/16  Home Health: No Equipment/Devices: None    Discharge Condition: Stable  CODE STATUS: FULL CODE Diet recommendation:   Brief/Interim Summary: Sharon Warner is a 66 y.o. female with Past medical history of paraplegia, neurogenic bladder, wheelchair-bound, chronic indwelling Foley catheter, Stage IV sacral decubitus ulcer, morbid obesity, HTN, type II DM, sacral osteomyelitis, chronic diastolic CHF who presented with complaints of fever. She  was febrile with tachycardia, UA was showing pyuria and patient was started on antibiotics and was referred for admission. She was found to have a Positive Blood Cx with Proteus and Group B Strep. CT of Abd/Pelvis showed 4cmx2cm fluids collection; General surgery/ortho/IR /ID consulted. Unfortunately patient went into acute CHF with aggressive IVF treatment for Sepsis so Cardiology was consulted for further recommendations and she also developed tachy/brady syndrome; currently all diuretic and betablocker held, she is on midorine.  cardiology to continue follow patient at [facility] and will  need to continue adjust meds. Patient has steadily improved and the plan is to transfer to long term care facility at [facility] for continued medical care; she needs to be followed up by cardiology, she needs have aggressive wound care (hydrotherapy), she need to be followed by infectious  disease.  Discharge Diagnoses:  Principal Problem:   Streptococcal bacteremia Active Problems:   Morbid obesity ([place])   Type 2 diabetes mellitus (HCC)   HTN (hypertension)   Paraplegia (HCC)   Neurogenic bladder   Obstructive sleep apnea   Microcytic anemia   Protein-calorie malnutrition (HCC)   Multinodular goiter   Hypercalcemia   Sacral decubitus ulcer, stage IV (HCC)   Acute on chronic combined systolic and diastolic CHF (congestive heart failure) (HCC)   Sacroiliitis (HCC)   Gram-negative bacteremia  Sepsis present on admission [facility]), Proteus Bacteremia and Strep Species -Pt presented with fever, leukocytosis, sinus tachycardia and evidence of pyuria/( urine was cloudy with sediment initial on presentation). uti thought was the cause of sepsis and bacteremia, foley cath changed at time of admission -however urine culture no growth, blood culture +pos for proteus and strep species which is consistent with culture from Last year in June 2017 when patient had polymicrobial bacteremia as well as sacral osteomyelitis and was treated with ceftriaxonex6weeks. -Case with infectious disease Dr Linus Salmons on 7/13 who recommend CT pel and general surgery consult for possible wound debridment -CT pel with possible left si joint septic arthritis with fluids collection 4cmx2cm anterior to the joint, Case was discussed with Orthopedic surgery Dr Elmyra Ricks on 7/14 who recommended IR and formal ID consult; per ortho should patient need ortho surgery in the future for SI Joint, patient need to be referred to academic center. -Case with IR Dr Annamaria Boots who do not feel aspiration will change management, he recommended Infectious disease consult. -General surgery and wound care consulted for wound care, start hydrotherapy per general surgery recommendations. -Infectious disease Dr Megan Salon consulted, after discussion with patient about long  term iv abx treatment vs trial of prolonged course of oral abx therapy ,  patient decided to try oral therapy, abx changed to keflex and flagyl from 7/14, please see id consult note from 7/14 for details -will need LTAC for hydrotherapy, continued care by infectious disease  -Follow up with Wound Care and with Infectious Diseases   Leukocytosis, improving -wbc start to rise from 7/14 -From over diuresis?; Likely from Bacteremia  Repeat ua  On 7/15 and repeat blood culture on 7/16, no growth , -lactic acid wnl -Repeat CBC at LTAC and C/w Abx per ID recc's   Hypotension:  -From over diuresis? From infection? -Patient looks stable, am cortisol level unremarkable -Trial of midodrine started and is on Midodrine 2.5 mg po TID -She received small fluids bolus 250cc x2 on 7/16, she does not have edema, on exam euvolemic to slightly dry -Per Cards continue to Hold Spironolactone, Torsemide, and BB due to Hypotension -Continue to Monitor at [facility]  Sinus Tachycardia/Bradycardia. -Pt presented with sinus tachycardia in the range of 130s and 140s iniitally on admission -Likely sequela of sepsis versus rebound tachycardia after holding beta blocker secondary to hypertension -Tachycardia resolved with resumption of beta blocker, however , patient noted to be transiently profoundly bradycardic/sinus arrest  -keep mag>2, k >4, tsh 2.1, -Cardiology consulted, now off betablocker , patient is deemed to be a poor candidate for pacemaker, meds adjustment per cardiology; Per Cardiology continue to hold spironolactone, torsemide, and BBdue to hypotension  Acute on Chronic combined systolic and diastolic heart failure, Acute hypoxic respiratory failure, improved -Patient had been on torsemide prior to hospital admission. -She received fluids resuscitation for sepsis initially, cxr with findings of developing pulmonary edema -echo with reduced ef 25-30% -Cardiology consulted,  Currently off  Po betablocker, ARB, Diuretics only. -Meds adjustment per cardiology. Weaned off  oxygen. -Follow up with Cardiology at [facility]  Hypokalemia/Hypomagnesemia, improved -Replace k/mag prn  -keep K.4, mag>2 -Mag was 1.9 yesterday and K+ was 3.8 -Repeat CMP and Mag Level at LTAC  Hypercalcemia, improve -ca 11.3 on admission -Etiology remains unclear. -Home vitamin D supplementation on hold -Calcium levels have normalized  Type 2 diabetes Mellitus. -C/w Sensitive Novolog SSI. -Not on diabetic medications prior to admission -Labs reviewed. Currently stable -CBG's ranging from 94-123 -Continue to Monitor at LTAC  OSA. -Continue CPAP Daily at bedtime. -Currently stable  Hoarse voice -Unclear etiology -Pt denies coughing, no sore throat, no n/v -? Laryngitis  -soft tissue neck CT no acute findings, rapid strep test negative -Improved.  Discharge Instructions  Discharge Instructions    Call MD for:  difficulty breathing, headache or visual disturbances    Complete by:  As directed    Call MD for:  extreme fatigue    Complete by:  As directed    Call MD for:  persistant dizziness or light-headedness    Complete by:  As directed    Call MD for:  persistant nausea and vomiting    Complete by:  As directed    Call MD for:  redness, tenderness, or signs of infection (pain, swelling, redness, odor or green/yellow discharge around incision site)    Complete by:  As directed    Call MD for:  severe uncontrolled pain    Complete by:  As directed    Call MD for:  temperature >100.4    Complete by:  As directed    Diet - low sodium heart healthy    Complete by:  As directed  Carb modified   Diet - low sodium heart healthy    Complete by:  As directed    Discharge instructions    Complete by:  As directed    Follow up Care at [facility]   Discharge wound care:    Complete by:  As directed    Hydrotherapy; Wet to Dry BID packing with Krilex in the interm   Increase activity slowly    Complete by:  As directed      Allergies as of 04/16/2017      Reactions    Levaquin [levofloxacin In D5w] Nausea Only   Unknown childhood allergy   Penicillins Other (See Comments)   Has patient had a PCN reaction causing immediate rash, facial/tongue/throat swelling, SOB or lightheadedness with hypotension: Unknown Has patient had a PCN reaction causing severe rash involving mucus membranes or skin necrosis: Unknown Has patient had a PCN reaction that required hospitalization: No Has patient had a PCN reaction occurring within the last 10 years: No If all of the above answers are "NO", then may proceed with Cephalosporin use.      Medication List    STOP taking these medications   metoprolol succinate 25 MG 24 hr tablet Commonly known as:  TOPROL-XL   torsemide 10 MG tablet Commonly known as:  DEMADEX   TURMERIC PO     TAKE these medications   aspirin EC 81 MG tablet Take 81 mg by mouth daily.   b complex vitamins tablet Take 1 tablet by mouth daily.   Biotin 10000 MCG Tabs Take 1,000 mcg by mouth daily.   cephALEXin 500 MG capsule Commonly known as:  KEFLEX Take 1 capsule (500 mg total) by mouth every 8 (eight) hours.   cholecalciferol 1000 units tablet Commonly known as:  VITAMIN D Take 2,000 Units by mouth daily.   enoxaparin 40 MG/0.4ML injection Commonly known as:  LOVENOX Inject 0.4 mLs (40 mg total) into the skin daily.   famotidine 20 MG tablet Commonly known as:  PEPCID Take 1 tablet (20 mg total) by mouth at bedtime.   feeding supplement (ENSURE ENLIVE) Liqd Take 237 mLs by mouth 2 (two) times daily between meals.   insulin aspart 100 UNIT/ML injection Commonly known as:  novoLOG Inject 0-15 Units into the skin 3 (three) times daily with meals.   KLOR-CON 10 10 MEQ tablet Generic drug:  potassium chloride Take 10 mEq by mouth daily.   Lactobacillus Tabs 250 mg by mouth bid   Melatonin 3 MG Tabs Take 3 mg by mouth as needed (sleep).   metoprolol tartrate 5 MG/5ML Soln injection Commonly known as:   LOPRESSOR Inject 2.5 mLs (2.5 mg total) into the vein every 4 (four) hours as needed (give for heart rate greater than 120, hold if sbp less than 90).   metroNIDAZOLE 500 MG tablet Commonly known as:  FLAGYL Take 1 tablet (500 mg total) by mouth every 8 (eight) hours.   midodrine 2.5 MG tablet Commonly known as:  PROAMATINE Take 1 tablet (2.5 mg total) by mouth 3 (three) times daily with meals.   naproxen sodium 220 MG tablet Commonly known as:  ANAPROX Take 440 mg by mouth as needed (pain temperature).   senna-docusate 8.6-50 MG tablet Commonly known as:  Senokot-S Take 1 tablet by mouth at bedtime.   traMADol-acetaminophen 37.5-325 MG tablet Commonly known as:  ULTRACET Take 1 tablet by mouth every 4 (four) hours as needed. for pain   zinc gluconate 50 MG tablet Take  50 mg by mouth daily.      Follow-up Information    Michel Bickers, MD Follow up in 3 week(s).   Specialty:  Infectious Diseases Why:  for  Contact information: [address] [place] [address] [phone]        [name], MD Follow up.   Specialty:  Family Medicine Contact information: [address] [place] [address] [phone]          Allergies  Allergen Reactions  . Levaquin [Levofloxacin In D5w] Nausea Only    Unknown childhood allergy  . Penicillins Other (See Comments)    Has patient had a PCN reaction causing immediate rash, facial/tongue/throat swelling, SOB or lightheadedness with hypotension: Unknown Has patient had a PCN reaction causing severe rash involving mucus membranes or skin necrosis: Unknown Has patient had a PCN reaction that required hospitalization: No Has patient had a PCN reaction occurring within the last 10 years: No If all of the above answers are "NO", then may proceed with Cephalosporin use.    Consultations:  Cardiology  Infectious Diseases  General Surgery  Interventional Radiology Dr. Annamaria Boots via  telephone 7/14  Orthopedic Surgery Dr. Elmyra Ricks via telephone 7/14  WOC/Wound Care  Procedures/Studies: Dg Chest 1 View  Result Date: 04/07/2017 CLINICAL DATA:  Shortness of breath EXAM: CHEST 1 VIEW COMPARISON:  04/04/2017 FINDINGS: Cardiomegaly with central vascular congestion and perihilar interstitial opacities suspicious for mild edema. No significant effusion. Mild atelectasis at the right base. No pneumothorax. IMPRESSION: Mild cardiomegaly with central vascular congestion and mild interstitial pulmonary edema. Atelectasis at the right lung base. Electronically Signed   By: Donavan Foil M.D.   On: 04/07/2017 00:32   Dg Chest 2 View  Result Date: 04/04/2017 CLINICAL DATA:  Chronic coughing for the past couple months. Pt has tachycardia today. No SOB. No chest pains. Pt takes HTN meds. Pt is diabetic. Nonsmoker. No hx of asthma, COPD, or AFIB. Pt has CHF. EXAM: CHEST - 2 VIEW COMPARISON:  03/13/2016 FINDINGS: Lungs are clear.  Mild elevation of the left diaphragmatic leaflet. Heart size and mediastinal contours are within normal limits. No effusion. Visualized bones unremarkable. IMPRESSION: No acute cardiopulmonary disease. Electronically Signed   By: Lucrezia Europe M.D.   On: 04/04/2017 12:28   Ct Soft Tissue Neck W Contrast  Result Date: 04/08/2017 CLINICAL DATA:  Dysphagia. History of paraplegia, neurogenic bladder with indwelling catheter, colostomy bag, sacral osteomyelitis, diabetes. EXAM: CT NECK WITH CONTRAST TECHNIQUE: Multidetector CT imaging of the neck was performed using the standard protocol following the bolus administration of intravenous contrast. CONTRAST:  85mL ISOVUE-300 IOPAMIDOL (ISOVUE-300) INJECTION 61% COMPARISON:  Chest radiograph April 07, 2017 and CT chest April 05, 2017 FINDINGS: Moderately motion degraded examination. Streak artifact from multiple overlying lines. PHARYNX AND LARYNX: Normal.  Widely patent airway. SALIVARY GLANDS: Normal. THYROID: 2 x 2 cm complex LEFT  thyroid nodule with coarse calcifications. Smaller RIGHT thyroid nodule present. Heterogeneous thyroid wound. LYMPH NODES: No lymphadenopathy by CT size criteria. VASCULAR: Normal. LIMITED INTRACRANIAL: Normal. VISUALIZED ORBITS: Normal. MASTOIDS AND VISUALIZED PARANASAL SINUSES: Well-aerated. SKELETON: Nonacute. Severe C5-6 disc height loss and endplate spurring compatible with degenerative discs. UPPER CHEST: Large RIGHT and small LEFT pleural effusions with venous congestion incompletely assessed. OTHER: None. IMPRESSION: 1. No acute process in the neck on this moderately motion degraded examination. Patent airway. 2. Increasing large RIGHT and small LEFT pleural effusions, and congestion consistent with pulmonary edema. 3. **An incidental finding of potential  clinical significance has been found. 2.1 cm LEFT thyroid nodule. Recommend thyroid ultrasound on nonemergent basis. This follows ACR consensus guidelines: Managing Incidental Thyroid Nodules Detected on Imaging: White Paper of the ACR Incidental Thyroid Findings Committee. J Am Coll Radiol 2015; 12:143-150. ** Electronically Signed   By: Elon Alas M.D.   On: 04/08/2017 18:53   Ct Angio Chest Pe W Or Wo Contrast  Result Date: 04/06/2017 CLINICAL DATA:  Tachycardia.  Sepsis, urinary tract infection. EXAM: CT ANGIOGRAPHY CHEST WITH CONTRAST TECHNIQUE: Multidetector CT imaging of the chest was performed using the standard protocol during bolus administration of intravenous contrast. Multiplanar CT image reconstructions and MIPs were obtained to evaluate the vascular anatomy. CONTRAST:  75 cc Isovue 370 IV COMPARISON:  Radiograph yesterday CT 03/13/2016 FINDINGS: Cardiovascular: There are no filling defects within the pulmonary arteries to suggest pulmonary embolus to the proximal segmental level. Subsegmental branches cannot be assessed due to contrast bolus timing and breathing motion artifact. Again seen dilatation of main pulmonary artery  measuring 4.1 cm. Unchanged aneurysmal dilatation of the ascending aorta maximal dimension 4.2 cm. No aortic dissection. Borderline mild cardiomegaly. Minimal pericardial fluid/thickening. Mediastinum/Nodes: No mediastinal hilar adenopathy. The esophagus is decompressed. There is heterogeneous low-density enlargement of the thyroid gland with calcifications on the left, unchanged in CT appearance. Lungs/Pleura: Breathing motion artifact limits detailed assessment. Elevated right hemidiaphragm with adjacent atelectasis and trace pleural effusion. Minimal left basilar atelectasis and pleural fluid/thickening. No consolidation to suggest pneumonia. No convincing pulmonary edema. No pneumothorax. Upper Abdomen: Calcified gallstones within the gallbladder. Liver appears prominent size with probable steatosis. Musculoskeletal: Minimal anterior wedging of T8 and T5 vertebral body that was not seen on prior exam. Scoliotic curvature of the thoracic spine. Review of the MIP images confirms the above findings. IMPRESSION: 1. No pulmonary embolus to the proximal segmental level. Again seen dilatation of the main pulmonary artery suggesting pulmonary arterial hypertension. 2. Unchanged 4.2 cm aneurysmal dilatation of the ascending aorta. Recommend annual imaging followup by CTA or MRA. This recommendation follows 2010 ACCF/AHA/AATS/ACR/ASA/SCA/SCAI/SIR/STS/SVM Guidelines for the Diagnosis and Management of Patients with Thoracic Aortic Disease. Circulation. 2010; 121: F573-U202 3. Elevated right hemidiaphragm with adjacent atelectasis and small pleural effusion. Minimal left basilar atelectasis and pleural fluid. 4. Mild compression fractures of T5 and T8 vertebral bodies, age indeterminate but new from June 2017. 5. Cholelithiasis, the gallbladder is incompletely imaged. 6. Enlargement of the thyroid gland low-density, probable nodules. This is unchanged in CT appearance. Consider nonemergent ultrasound characterization.  Electronically Signed   By: Jeb Levering M.D.   On: 04/06/2017 00:35   Ct Pelvis W Contrast  Addendum Date: 04/11/2017   ADDENDUM REPORT: 04/11/2017 19:47 ADDENDUM: Additional finding to musculoskeletal section: Bilateral intramedullary femoral rods across old left distal shaft fracture. There is somewhat posterior peripheral positioning of the right femoral rod. There is a detached fixating screw within the medial, posterior tissues of the upper thigh/gluteal region. Electronically Signed   By: Donavan Foil M.D.   On: 04/11/2017 19:47   Addendum Date: 04/11/2017   ADDENDUM REPORT: 04/11/2017 19:28 ADDENDUM: Left external iliac and inguinal adenopathy likely reactive Electronically Signed   By: Donavan Foil M.D.   On: 04/11/2017 19:28   Result Date: 04/11/2017 CLINICAL DATA:  Sacral decubitus ulcer, fever EXAM: CT PELVIS WITH CONTRAST TECHNIQUE: Multidetector CT imaging of the pelvis was performed using the standard protocol following the bolus administration of intravenous contrast. CONTRAST:  145mL ISOVUE-300 IOPAMIDOL (ISOVUE-300) INJECTION 61% COMPARISON:  03/15/2016, 03/13/2016 FINDINGS: Urinary  Tract: Foley catheter in the bladder with small air present in the bladder. Very thick-walled appearance of the bladder. Low lying left kidney containing multiple stones, measuring up to 6 mm in the lower pole. No obvious hydronephrosis. Bowel: Mild rectum thickening. Left lower quadrant colostomy with large amount of stool. Vascular/Lymphatic: Aortic atherosclerosis. subcentimeter periaortic lymph nodes. Subcentimeter pelvic sidewall lymph nodes. Enlarged left external iliac nodes measuring up to 2.1 cm. Multiple enlarged left inguinal lymph nodes measuring up to 2.2 cm in short axis. Reproductive:  Status post hysterectomy.  No adnexal masses. Other: Diffuse subcutaneous edema. Mild very rectal soft tissue stranding and small amount of fluid in the pelvis. Musculoskeletal: Large, deep sacral decubitus  ulcer, measuring at least 6.7 cm transverse. Underlying bony sclerosis and erosive changes consistent with chronic osteomyelitis. Soft tissue thickening around the left posterior SI joint with bony erosive changes present. Fluid and gas collection anterior to the left SI joint and abutting or involving the left iliacus muscle, consistent with abscess. This measures 2.5 by 2.1 by 4 cm. IMPRESSION: 1. Deep sacral decubitus ulcer down to the bone with underlying sclerosis and extensive erosive changes, consistent with osteomyelitis. More acute appearing soft tissue and erosive change involving the left posterior SI joint. There is gas and mildly rim enhancing fluid collection anterior to the left SI joint which appears contiguous with the joint space, this measures 4 cm cranial caudad by 2.1 cm transverse. The collective findings are concerning for left septic arthritis of the sacroiliac joint with associated abscess anterior to the joint. 2. Low lying left kidney with multiple stones. Thick-walled urinary bladder with Foley catheter could be due to cystitis 3. Mild rectal wall thickening, query proctitis. Electronically Signed: By: Donavan Foil M.D. On: 04/11/2017 19:00   Dg Chest Port 1 View  Result Date: 04/12/2017 CLINICAL DATA:  Shortness of breath today EXAM: PORTABLE CHEST 1 VIEW COMPARISON:  04/07/2017 FINDINGS: Cardiomegaly with vascular congestion. Left lower lobe atelectasis or infiltrate is similar to prior study. Probable small left effusion. No overt edema. No acute bony abnormality. IMPRESSION: Small left effusion with left lower lobe atelectasis or infiltrate. Cardiomegaly. Electronically Signed   By: Rolm Baptise M.D.   On: 04/12/2017 09:15   Dg Chest Port 1 View  Result Date: 04/07/2017 CLINICAL DATA:  SOB. Hx of HTN and DM. EXAM: PORTABLE CHEST 1 VIEW COMPARISON:  04/07/2017 FINDINGS: Heart is enlarged. There are perihilar infiltrates. More focal opacity is identified at the lung bases,  partially obscuring the hemidiaphragms. Findings are consistent with pulmonary edema, similar in appearance to exam earlier today. IMPRESSION: Congestive heart failure. Electronically Signed   By: Nolon Nations M.D.   On: 04/07/2017 08:22    Subjective: Seen and examined at bedside and was improved. Had no complaints. No nausea or vomiting. Ready to try Hydrotherapy and ready to be transferred to [facility].  Discharge Exam: Vitals:   04/16/17 0400 04/16/17 0800  BP: (!) 93/47 (!) 96/58  Pulse: 69 84  Resp: (!) 26 (!) 22  Temp: 97.8 F (36.6 C) 98.1 F (36.7 C)   Vitals:   04/16/17 0000 04/16/17 0400 04/16/17 0500 04/16/17 0800  BP: (!) 91/54 (!) 93/47  (!) 96/58  Pulse: 91 69  84  Resp: (!) 25 (!) 26  (!) 22  Temp: 98.3 F (36.8 C) 97.8 F (36.6 C)  98.1 F (36.7 C)  TempSrc: Oral Axillary  Oral  SpO2: 94% 95%  97%  Weight:   73.6 kg (162 lb  4.1 oz)   Height:       General: Pt is alert, awake, not in acute distress Cardiovascular: RRR, S1/S2 +, no rubs, no gallops Respiratory: CTA bilaterally, no wheezing, no rhonchi Abdominal: Soft, NT, ND, bowel sounds +; Left Quadrant Colostomy Extremities: no edema, no cyanosis; Has large sacral decubitus ulcer GU: Foley catheter in place  The results of significant diagnostics from this hospitalization (including imaging, microbiology, ancillary and laboratory) are listed below for reference.    Microbiology: Recent Results (from the past 240 hour(s))  Rapid strep screen (not at [facility])     Status: None   Collection Time: 04/09/17  9:18 AM  Result Value Ref Range Status   Streptococcus, Group A Screen (Direct) NEGATIVE NEGATIVE Final    Comment: (NOTE) A Rapid Antigen test may result negative if the antigen level in the sample is below the detection level of this test. The FDA has not cleared this test as a stand-alone test therefore the rapid antigen negative result has reflexed to a Group A Strep culture.   Culture, group A strep      Status: None   Collection Time: 04/09/17  9:18 AM  Result Value Ref Range Status   Specimen Description THROAT  Final   Special Requests NONE Reflexed from [id number]  Final   Culture   Final    NO GROUP A STREP (S.PYOGENES) ISOLATED Performed at [facility], [address] [place], [place] [address]    Report Status 04/11/2017 FINAL  Final  Culture, blood (routine x 2)     Status: None   Collection Time: 04/09/17  9:46 AM  Result Value Ref Range Status   Specimen Description BLOOD LEFT ARM  Final   Special Requests IN PEDIATRIC BOTTLE Blood Culture adequate volume  Final   Culture   Final    NO GROWTH 5 DAYS Performed at [facility] [facility], [place] [address] [place], [place] [address]    Report Status 04/14/2017 FINAL  Final  Culture, blood (routine x 2)     Status: Abnormal   Collection Time: 04/09/17  9:46 AM  Result Value Ref Range Status   Specimen Description BLOOD RIGHT ARM  Final   Special Requests IN PEDIATRIC BOTTLE Blood Culture adequate volume  Final   Culture  Setup Time   Final    GRAM POSITIVE COCCI IN CLUSTERS IN PEDIATRIC BOTTLE CRITICAL RESULT CALLED TO, READ BACK BY AND VERIFIED WITH: A. Pham Pharm.D. 12:10 04/10/17 ([email or web address]) Performed at [facility], [place] [address] [place], [place] [address]    Culture (A)  Final    STAPHYLOCOCCUS SPECIES (COAGULASE NEGATIVE) SPECIFICALLY  STAPHYLOCOCCUS EPIDERMIDIS    Report Status 04/15/2017 FINAL  Final  Blood Culture ID Panel (Reflexed)     Status: Abnormal   Collection Time: 04/09/17  9:46 AM  Result Value Ref Range Status   Enterococcus species NOT DETECTED NOT DETECTED Final   Listeria monocytogenes NOT DETECTED NOT DETECTED Final   Staphylococcus species DETECTED (A) NOT DETECTED Final    Comment: Methicillin (oxacillin) resistant coagulase negative staphylococcus. Possible blood culture contaminant (unless isolated from more than one blood culture draw or clinical case suggests pathogenicity). No  antibiotic treatment is indicated for blood  culture contaminants. CRITICAL RESULT CALLED TO, READ BACK BY AND VERIFIED WITH: A. Pham Pharm.D. 12:10 04/10/17 ([email or web address])    Staphylococcus aureus NOT DETECTED NOT DETECTED Final   Methicillin resistance DETECTED (A) NOT DETECTED Final    Comment: CRITICAL  RESULT CALLED TO, READ BACK BY AND VERIFIED WITH: A. Pham Pharm.D. 12:10 04/10/17 ([email or web address])    Streptococcus species NOT DETECTED NOT DETECTED Final   Streptococcus agalactiae NOT DETECTED NOT DETECTED Final   Streptococcus pneumoniae NOT DETECTED NOT DETECTED Final   Streptococcus pyogenes NOT DETECTED NOT DETECTED Final   Acinetobacter baumannii NOT DETECTED NOT DETECTED Final   Enterobacteriaceae species NOT DETECTED NOT DETECTED Final   Enterobacter cloacae complex NOT DETECTED NOT DETECTED Final   Escherichia coli NOT DETECTED NOT DETECTED Final   Klebsiella oxytoca NOT DETECTED NOT DETECTED Final   Klebsiella pneumoniae NOT DETECTED NOT DETECTED Final   Proteus species NOT DETECTED NOT DETECTED Final   Serratia marcescens NOT DETECTED NOT DETECTED Final   Haemophilus influenzae NOT DETECTED NOT DETECTED Final   Neisseria meningitidis NOT DETECTED NOT DETECTED Final   Pseudomonas aeruginosa NOT DETECTED NOT DETECTED Final   Candida albicans NOT DETECTED NOT DETECTED Final   Candida glabrata NOT DETECTED NOT DETECTED Final   Candida krusei NOT DETECTED NOT DETECTED Final   Candida parapsilosis NOT DETECTED NOT DETECTED Final   Candida tropicalis NOT DETECTED NOT DETECTED Final    Comment: Performed at [facility], [place] [address], [place], [place] [address]  Culture, Urine     Status: Abnormal   Collection Time: 04/13/17  7:26 AM  Result Value Ref Range Status   Specimen Description URINE, RANDOM  Final   Special Requests NONE  Final   Culture MULTIPLE SPECIES PRESENT, SUGGEST RECOLLECTION (A)  Final   Report Status 04/15/2017 FINAL  Final  Culture, blood (routine x 2)      Status: None (Preliminary result)   Collection Time: 04/13/17  7:40 AM  Result Value Ref Range Status   Specimen Description BLOOD LEFT HAND  Final   Special Requests IN PEDIATRIC BOTTLE Blood Culture adequate volume  Final   Culture   Final    NO GROWTH 2 DAYS Performed at [facility], [place] [address], [place], [place] [address]    Report Status PENDING  Incomplete  Culture, blood (routine x 2)     Status: Abnormal   Collection Time: 04/13/17  7:41 AM  Result Value Ref Range Status   Specimen Description BLOOD LEFT ANTECUBITAL  Final   Special Requests   Final    BOTTLES DRAWN AEROBIC ONLY Blood Culture adequate volume   Culture  Setup Time   Final    GRAM POSITIVE COCCI IN CLUSTERS AEROBIC BOTTLE ONLY CRITICAL RESULT CALLED TO, READ BACK BY AND VERIFIED WITH: A PHAM,PHARMD AT 04/14/17 BY L BENFIELD Performed at [facility], [place] [address], [place], [place] [address]    Culture STAPHYLOCOCCUS EPIDERMIDIS (A)  Final   Report Status 04/16/2017 FINAL  Final   Organism ID, Bacteria STAPHYLOCOCCUS EPIDERMIDIS  Final      Susceptibility   Staphylococcus epidermidis - MIC*    CIPROFLOXACIN >=8 RESISTANT Resistant     ERYTHROMYCIN >=8 RESISTANT Resistant     GENTAMICIN >=16 RESISTANT Resistant     OXACILLIN >=4 RESISTANT Resistant     TETRACYCLINE 2 SENSITIVE Sensitive     VANCOMYCIN 1 SENSITIVE Sensitive     TRIMETH/SULFA 160 RESISTANT Resistant     CLINDAMYCIN >=8 RESISTANT Resistant     RIFAMPIN <=0.5 SENSITIVE Sensitive     Inducible Clindamycin NEGATIVE Sensitive     * STAPHYLOCOCCUS EPIDERMIDIS  Culture, blood (routine x 2)     Status: None (Preliminary result)   Collection  Time: 04/14/17  6:03 PM  Result Value Ref Range Status   Specimen Description BLOOD RIGHT HAND  Final   Special Requests   Final    BOTTLES DRAWN AEROBIC AND ANAEROBIC Blood Culture adequate volume   Culture   Final    NO GROWTH < 24 HOURS Performed at [facility], [address] [place], [place] [address]    Report Status PENDING  Incomplete  Culture, blood (routine x 2)     Status: None (Preliminary result)   Collection Time: 04/14/17  6:20 PM  Result Value Ref Range Status   Specimen Description BLOOD LEFT ARM  Final   Special Requests   Final    BOTTLES DRAWN AEROBIC AND ANAEROBIC Blood Culture adequate volume   Culture   Final    NO GROWTH < 24 HOURS Performed at [facility], [place] [address], [place],  [address]    Report Status PENDING  Incomplete    Labs: BNP (last 3 results)  Recent Labs  04/12/17 0334  BNP 2,993.7*   Basic Metabolic Panel:  Recent Labs Lab 04/10/17 0316  04/12/17 0334 04/13/17 0330 04/14/17 0321 04/15/17 0254 04/16/17 0244  NA 142  < > 136 135 135 134* 136  K 3.4*  < > 4.3 4.2 4.0 3.9 3.8  CL 103  < > 93* 94* 94* 96* 98*  CO2 30  < > 31 33* 32 31 30  GLUCOSE 125*  < > 133* 143* 113* 102* 112*  BUN 24*  < > 22* 19 23* 25* 21*  CREATININE 0.66  < > 0.56 0.48 0.58 0.48 0.55  CALCIUM 9.8  < > 9.8 9.9 9.7 9.3 9.6  MG 2.2  --  2.1  --   --  1.9  --   < > = values in this interval not displayed. Liver Function Tests: No results for input(s): AST, ALT, ALKPHOS, BILITOT, PROT, ALBUMIN in the last 168 hours. No results for input(s): LIPASE, AMYLASE in the last 168 hours. No results for input(s): AMMONIA in the last 168 hours. CBC:  Recent Labs Lab 04/12/17 0334 04/13/17 0330 04/14/17 0321 04/15/17 0254 04/16/17 0244  WBC 15.3* 16.3* 12.5* 11.4* 11.2*  NEUTROABS 10.5* 11.9* 8.1* 7.4 7.1  HGB 11.1* 9.9* 9.2* 8.1* 8.5*  HCT 36.4 32.3* 29.0* 26.3* 28.2*  MCV 77.6* 77.5* 77.5* 76.7* 75.6*  PLT 456* 403* 352 269 307   Cardiac Enzymes: No results for input(s): CKTOTAL, CKMB, CKMBINDEX, TROPONINI in the last 168 hours. BNP: Invalid input(s): POCBNP CBG:  Recent Labs Lab 04/15/17 0725 04/15/17 1132 04/15/17 1535 04/15/17 2157 04/16/17 0734  GLUCAP 94 123* 111* 113* 108*   D-Dimer No results for  input(s): DDIMER in the last 72 hours. Hgb A1c No results for input(s): HGBA1C in the last 72 hours. Lipid Profile No results for input(s): CHOL, HDL, LDLCALC, TRIG, CHOLHDL, LDLDIRECT in the last 72 hours. Thyroid function studies No results for input(s): TSH, T4TOTAL, T3FREE, THYROIDAB in the last 72 hours.  Invalid input(s): FREET3 Anemia work up No results for input(s): VITAMINB12, FOLATE, FERRITIN, TIBC, IRON, RETICCTPCT in the last 72 hours. Urinalysis    Component Value Date/Time   COLORURINE YELLOW 04/13/2017 0726   APPEARANCEUR CLOUDY (A) 04/13/2017 0726   LABSPEC 1.014 04/13/2017 0726   PHURINE 8.0 04/13/2017 0726   GLUCOSEU NEGATIVE 04/13/2017 0726   HGBUR SMALL (A) 04/13/2017 0726   BILIRUBINUR NEGATIVE 04/13/2017 0726   KETONESUR NEGATIVE 04/13/2017 0726   PROTEINUR  30 (A) 04/13/2017 0726   NITRITE POSITIVE (A) 04/13/2017 0726   LEUKOCYTESUR LARGE (A) 04/13/2017 0726   Sepsis Labs Invalid input(s): PROCALCITONIN,  WBC,  LACTICIDVEN Microbiology Recent Results (from the past 240 hour(s))  Rapid strep screen (not at [facility])     Status: None   Collection Time: 04/09/17  9:18 AM  Result Value Ref Range Status   Streptococcus, Group A Screen (Direct) NEGATIVE NEGATIVE Final    Comment: (NOTE) A Rapid Antigen test may result negative if the antigen level in the sample is below the detection level of this test. The FDA has not cleared this test as a stand-alone test therefore the rapid antigen negative result has reflexed to a Group A Strep culture.   Culture, group A strep     Status: None   Collection Time: 04/09/17  9:18 AM  Result Value Ref Range Status   Specimen Description THROAT  Final   Special Requests NONE Reflexed from [id number]  Final   Culture   Final    NO GROUP A STREP (S.PYOGENES) ISOLATED Performed at [facility], [address] [place], [place] [address]    Report Status 04/11/2017 FINAL  Final  Culture, blood (routine x 2)     Status:  None   Collection Time: 04/09/17  9:46 AM  Result Value Ref Range Status   Specimen Description BLOOD LEFT ARM  Final   Special Requests IN PEDIATRIC BOTTLE Blood Culture adequate volume  Final   Culture   Final    NO GROWTH 5 DAYS Performed at [facility], [place] [address] [place], [place] [address]    Report Status 04/14/2017 FINAL  Final  Culture, blood (routine x 2)     Status: Abnormal   Collection Time: 04/09/17  9:46 AM  Result Value Ref Range Status   Specimen Description BLOOD RIGHT ARM  Final   Special Requests IN PEDIATRIC BOTTLE Blood Culture adequate volume  Final   Culture  Setup Time   Final    GRAM POSITIVE COCCI IN CLUSTERS IN PEDIATRIC BOTTLE CRITICAL RESULT CALLED TO, READ BACK BY AND VERIFIED WITH: A. Pham Pharm.D. 12:10 04/10/17 ([email or web address]) Performed at [facility], [place] [address], [place], [place] [address]    Culture (A)  Final    STAPHYLOCOCCUS SPECIES (COAGULASE NEGATIVE) SPECIFICALLY  STAPHYLOCOCCUS EPIDERMIDIS    Report Status 04/15/2017 FINAL  Final  Blood Culture ID Panel (Reflexed)     Status: Abnormal   Collection Time: 04/09/17  9:46 AM  Result Value Ref Range Status   Enterococcus species NOT DETECTED NOT DETECTED Final   Listeria monocytogenes NOT DETECTED NOT DETECTED Final   Staphylococcus species DETECTED (A) NOT DETECTED Final    Comment: Methicillin (oxacillin) resistant coagulase negative staphylococcus. Possible blood culture contaminant (unless isolated from more than one blood culture draw or clinical case suggests pathogenicity). No antibiotic treatment is indicated for blood  culture contaminants. CRITICAL RESULT CALLED TO, READ BACK BY AND VERIFIED WITH: A. Pham Pharm.D. 12:10 04/10/17 ([email or web address])    Staphylococcus aureus NOT DETECTED NOT DETECTED Final   Methicillin resistance DETECTED (A) NOT DETECTED Final    Comment: CRITICAL RESULT CALLED TO, READ BACK BY AND VERIFIED WITH: A. Pham Pharm.D. 12:10 04/10/17 ([email or web address])     Streptococcus species NOT DETECTED NOT DETECTED Final   Streptococcus agalactiae NOT DETECTED NOT DETECTED Final   Streptococcus pneumoniae NOT DETECTED NOT DETECTED Final   Streptococcus pyogenes NOT DETECTED NOT DETECTED Final  Acinetobacter baumannii NOT DETECTED NOT DETECTED Final   Enterobacteriaceae species NOT DETECTED NOT DETECTED Final   Enterobacter cloacae complex NOT DETECTED NOT DETECTED Final   Escherichia coli NOT DETECTED NOT DETECTED Final   Klebsiella oxytoca NOT DETECTED NOT DETECTED Final   Klebsiella pneumoniae NOT DETECTED NOT DETECTED Final   Proteus species NOT DETECTED NOT DETECTED Final   Serratia marcescens NOT DETECTED NOT DETECTED Final   Haemophilus influenzae NOT DETECTED NOT DETECTED Final   Neisseria meningitidis NOT DETECTED NOT DETECTED Final   Pseudomonas aeruginosa NOT DETECTED NOT DETECTED Final   Candida albicans NOT DETECTED NOT DETECTED Final   Candida glabrata NOT DETECTED NOT DETECTED Final   Candida krusei NOT DETECTED NOT DETECTED Final   Candida parapsilosis NOT DETECTED NOT DETECTED Final   Candida tropicalis NOT DETECTED NOT DETECTED Final    Comment: Performed at [facility], [place] [address] [place], [place] [address]  Culture, Urine     Status: Abnormal   Collection Time: 04/13/17  7:26 AM  Result Value Ref Range Status   Specimen Description URINE, RANDOM  Final   Special Requests NONE  Final   Culture MULTIPLE SPECIES PRESENT, SUGGEST RECOLLECTION (A)  Final   Report Status 04/15/2017 FINAL  Final  Culture, blood (routine x 2)     Status: None (Preliminary result)   Collection Time: 04/13/17  7:40 AM  Result Value Ref Range Status   Specimen Description BLOOD LEFT HAND  Final   Special Requests IN PEDIATRIC BOTTLE Blood Culture adequate volume  Final   Culture   Final    NO GROWTH 2 DAYS Performed at [facility], [place] [address], [place], [place] [address]    Report Status PENDING  Incomplete  Culture, blood  (routine x 2)     Status: Abnormal   Collection Time: 04/13/17  7:41 AM  Result Value Ref Range Status   Specimen Description BLOOD LEFT ANTECUBITAL  Final   Special Requests   Final    BOTTLES DRAWN AEROBIC ONLY Blood Culture adequate volume   Culture  Setup Time   Final    GRAM POSITIVE COCCI IN CLUSTERS AEROBIC BOTTLE ONLY CRITICAL RESULT CALLED TO, READ BACK BY AND VERIFIED WITH: A PHAM,PHARMD AT 04/14/17 BY L BENFIELD Performed at [facility], [place] [address] [place], [place] [address]    Culture STAPHYLOCOCCUS EPIDERMIDIS (A)  Final   Report Status 04/16/2017 FINAL  Final   Organism ID, Bacteria STAPHYLOCOCCUS EPIDERMIDIS  Final      Susceptibility   Staphylococcus epidermidis - MIC*    CIPROFLOXACIN >=8 RESISTANT Resistant     ERYTHROMYCIN >=8 RESISTANT Resistant     GENTAMICIN >=16 RESISTANT Resistant     OXACILLIN >=4 RESISTANT Resistant     TETRACYCLINE 2 SENSITIVE Sensitive     VANCOMYCIN 1 SENSITIVE Sensitive     TRIMETH/SULFA 160 RESISTANT Resistant     CLINDAMYCIN >=8 RESISTANT Resistant     RIFAMPIN <=0.5 SENSITIVE Sensitive     Inducible Clindamycin NEGATIVE Sensitive     * STAPHYLOCOCCUS EPIDERMIDIS  Culture, blood (routine x 2)     Status: None (Preliminary result)   Collection Time: 04/14/17  6:03 PM  Result Value Ref Range Status   Specimen Description BLOOD RIGHT HAND  Final   Special Requests   Final    BOTTLES DRAWN AEROBIC AND ANAEROBIC Blood Culture adequate volume   Culture   Final    NO GROWTH < 24 HOURS Performed  at [facility], [place] [address], [place], [place] [address]    Report Status PENDING  Incomplete  Culture, blood (routine x 2)     Status: None (Preliminary result)   Collection Time: 04/14/17  6:20 PM  Result Value Ref Range Status   Specimen Description BLOOD LEFT ARM  Final   Special Requests   Final    BOTTLES DRAWN AEROBIC AND ANAEROBIC Blood Culture adequate volume   Culture   Final    NO GROWTH < 24 HOURS Performed  at [facility], [place] [address], [place], [place] [address]    Report Status PENDING  Incomplete   Time coordinating discharge: 35 minutes  SIGNED:  Kerney Elbe, DO [facility] 04/16/2017, 11:22 AM Pager [phone]  If 7PM-7AM, please contact night-coverage [email or web address] Password [email or web address]

## 2017-04-16 NOTE — Progress Notes (Signed)
Pt transported via [facility] to [facility]. No noted s/s of distress and denies pain. Report called to nurse [facility] receiving [facility]. Pt spouse called and updated on transport.

## 2017-04-16 NOTE — Progress Notes (Signed)
[id number]/DrAlfredia Ferguson notified of impending [facility]/spoke to the husband he will be at [facility] at approx. Two pm today/Lamae Fosco,BSN,RN3,CCM. [phone]

## 2017-04-16 NOTE — Progress Notes (Signed)
Progress Note  Patient Name: Sharon Warner Date of Encounter: 04/16/2017  Primary Cardiologist: New (Nahser)  Subjective    She had a few bradycardia episodes rates 30-40s while sleeping last night and one asymptomatic episode while awake this morning per RN.  Discharge has been cancelled twice due to insurance complications.  Midodrine dose increased yesterday.  Vitals:   04/16/17 0000 04/16/17 0400  BP: (!) 91/54 (!) 93/47  Pulse: 91 69  Resp: (!) 25 (!) 26  Temp: 98.3 F (36.8 C) 97.8 F (36.6 C)     Inpatient Medications    Scheduled Meds: . acidophilus  2 capsule Oral TID  . aspirin EC  81 mg Oral Daily  . cephALEXin  500 mg Oral Q8H  . enoxaparin (LOVENOX) injection  40 mg Subcutaneous Q24H  . famotidine  20 mg Oral BID  . feeding supplement (ENSURE ENLIVE)  237 mL Oral BID BM  . fluticasone  2 spray Each Nare Daily  . insulin aspart  0-15 Units Subcutaneous TID WC  . insulin aspart  0-5 Units Subcutaneous QHS  . mouth rinse  15 mL Mouth Rinse BID  . metroNIDAZOLE  500 mg Oral Q8H  . midodrine  2.5 mg Oral TID WC  . multivitamin with minerals  1 tablet Oral Daily  . potassium chloride  10 mEq Oral Daily  . senna-docusate  1 tablet Oral QHS  . sodium chloride flush  3 mL Intravenous Q12H   Continuous Infusions:  PRN Meds: acetaminophen **OR** acetaminophen, iopamidol, metoprolol tartrate, naproxen sodium, ondansetron **OR** ondansetron (ZOFRAN) IV, traMADol-acetaminophen   Vital Signs    Vitals:   04/15/17 2016 04/16/17 0000 04/16/17 0400 04/16/17 0500  BP: (!) 92/50 (!) 91/54 (!) 93/47   Pulse: (!) 103 91 69   Resp: (!) 22 (!) 25 (!) 26   Temp:  98.3 F (36.8 C) 97.8 F (36.6 C)   TempSrc:  Oral Axillary   SpO2: 93% 94% 95%   Weight:    162 lb 4.1 oz (73.6 kg)  Height:        Intake/Output Summary (Last 24 hours) at 04/16/17 0826 Last data filed at 04/16/17 0600  Gross per 24 hour  Intake             1150 ml  Output             1600 ml    Net             -450 ml   Filed Weights   04/14/17 0500 04/15/17 0500 04/16/17 0500  Weight: 159 lb 2.8 oz (72.2 kg) 162 lb 11.2 oz (73.8 kg) 162 lb 4.1 oz (73.6 kg)    Telemetry    NSR- Personally Reviewed   Physical Exam   GEN: chronically ill appearing HEENT: normal  Neck: no JVD, carotid bruits, or masses Cardiac: RRR. no murmurs, rubs, or gallops,no edema. Intact distal pulses bilaterally.  Respiratory: clear to auscultation bilaterally, normal work of breathing GI: soft, nontender, nondistended, + BS Sharon: no deformity or atrophy  Skin: warm and dry, no rash Neuro: paraplegic, alert and oriented. Psych:   Full affect  Labs    Chemistry  Recent Labs Lab 04/14/17 0321 04/15/17 0254 04/16/17 0244  NA 135 134* 136  K 4.0 3.9 3.8  CL 94* 96* 98*  CO2 32 31 30  GLUCOSE 113* 102* 112*  BUN 23* 25* 21*  CREATININE 0.58 0.48 0.55  CALCIUM 9.7 9.3 9.6  GFRNONAA >60 >60 >60  GFRAA >60 >60 >60  ANIONGAP 9 7 8      Hematology  Recent Labs Lab 04/14/17 0321 04/15/17 0254 04/16/17 0244  WBC 12.5* 11.4* 11.2*  RBC 3.74* 3.43* 3.73*  HGB 9.2* 8.1* 8.5*  HCT 29.0* 26.3* 28.2*  MCV 77.5* 76.7* 75.6*  MCH 24.6* 23.6* 22.8*  MCHC 31.7 30.8 30.1  RDW 17.0* 17.0* 16.9*  PLT 352 269 307    Cardiac EnzymesNo results for input(s): TROPONINI in the last 168 hours. No results for input(s): TROPIPOC in the last 168 hours.   BNP  Recent Labs Lab 04/12/17 0334  BNP 1,317.3*     DDimer No results for input(s): DDIMER in the last 168 hours.   Radiology    No results found.  Cardiac Studies   Echo 04/09/17: Study Conclusions  - Left ventricle: Septal and apical akinesis. Inferior and distal anterior wall hypokinesis. The cavity size was severely dilated. Wall thickness was increased in a pattern of moderate LVH. Systolic function was severely reduced. The estimated ejection fraction was in the range of 25% to 30%. - Aortic valve: There was  trivial regurgitation. - Left atrium: The atrium was mildly dilated. - Atrial septum: No defect or patent foramen ovale was identified. - Pulmonary arteries: PA peak pressure: 33 mm Hg (S). - Pericardium, extracardiac: Small but nearly circumferential pericardial effusion no tamponade IVC small  Patient Profile     66 y.o.femalewith chronic systolic and diastolic heart failure, hypertension, paraplegia, neurogenic bladder, sacral decubits ulcer, morbid obesity, and colostomy bag here with sepsis who developed acute on chronic heart failure and bradycardia.   Assessment & Plan     1. Acute on chronic systolic and diastolic heart failure:  Hypotension and tachycardia likely due to sepsis and not related to heart failure (Per Dr. Percival Spanish). Continue to hold spironolactone, torsemide, and BB due to hypotension.  -- Currently requiring Midodrine, dose increased yesterday from 2.5 mg BID to TID.   2. Bradycardia:  Sharon Warner had a 14 second pause while swallowing pills on 7/10. No further pauses but did experience bradycardia while asleep last night rate 30-40s and one episode, without symptoms while awake this AM. -- Would like to restart BB as BP allows but I have concerns that we may not be able to with the bradycardia.  When insurance approves the plan is for patient to be transferred to a [facility] facility for aggressive sacral wound care.   Signed, Linus Mako, PA-C  04/16/2017, 8:26 AM    History and all data above reviewed.  Patient examined.  I agree with the findings as above.  Her BP has been labile.  Somewhat tachycardic but without symptoms.   No chest pain.  No SOB.   The patient exam reveals COR:RRR  ,  Lungs: Clear  ,  Abd: Positive bowel sounds, no rebound no guarding, Ext No edema  .  All available labs, radiology testing, previous records reviewed. Agree with documented assessment and plan. Tachycardia:  Unable to titrate meds at this point with labile BP.  Increased  midodrine yesterday.  I will follow for med adjustments as we are able.    Sharon Warner  6:52 PM  04/16/2017

## 2017-04-16 NOTE — Progress Notes (Signed)
CC:  sepsis  Subjective: Wound is clean and looks good.  Undermining up to 6 cm, and down to bone.  Objective: Vital signs in last 24 hours: Temp:  [97.8 F (36.6 C)-98.6 F (37 C)] 98.1 F (36.7 C) (07/18 0800) Pulse Rate:  [69-109] 84 (07/18 0800) Resp:  [22-31] 22 (07/18 0800) BP: (85-96)/(46-58) 96/58 (07/18 0800) SpO2:  [93 %-97 %] 97 % (07/18 0800) Weight:  [73.6 kg (162 lb 4.1 oz)] 73.6 kg (162 lb 4.1 oz) (07/18 0500) Last BM Date: 04/16/17  Intake/Output from previous day: 07/17 0701 - 07/18 0700 In: 1370 [P.O.:1280; I.V.:90] Out: 1850 [Urine:1850] Intake/Output this shift: No intake/output data recorded.  General appearance: alert, cooperative and no distress Incision/Wound:  Clean and open, down to the bone, nothing to require surgery. Undermining up to 6 cm.  Lab Results:   Recent Labs  04/15/17 0254 04/16/17 0244  WBC 11.4* 11.2*  HGB 8.1* 8.5*  HCT 26.3* 28.2*  PLT 269 307    BMET  Recent Labs  04/15/17 0254 04/16/17 0244  NA 134* 136  K 3.9 3.8  CL 96* 98*  CO2 31 30  GLUCOSE 102* 112*  BUN 25* 21*  CREATININE 0.48 0.55  CALCIUM 9.3 9.6   PT/INR No results for input(s): LABPROT, INR in the last 72 hours.  No results for input(s): AST, ALT, ALKPHOS, BILITOT, PROT, ALBUMIN in the last 168 hours.   Lipase  No results found for: LIPASE   Medications: . acidophilus  2 capsule Oral TID  . aspirin EC  81 mg Oral Daily  . cephALEXin  500 mg Oral Q8H  . enoxaparin (LOVENOX) injection  40 mg Subcutaneous Q24H  . famotidine  20 mg Oral BID  . feeding supplement (ENSURE ENLIVE)  237 mL Oral BID BM  . fluticasone  2 spray Each Nare Daily  . insulin aspart  0-15 Units Subcutaneous TID WC  . insulin aspart  0-5 Units Subcutaneous QHS  . mouth rinse  15 mL Mouth Rinse BID  . metroNIDAZOLE  500 mg Oral Q8H  . midodrine  2.5 mg Oral TID WC  . multivitamin with minerals  1 tablet Oral Daily  . potassium chloride  10 mEq Oral Daily  .  senna-docusate  1 tablet Oral QHS  . sodium chloride flush  3 mL Intravenous Q12H    Anti-infectives    Start     Dose/Rate Route Frequency Ordered Stop   04/14/17 0000  cephALEXin (KEFLEX) 500 MG capsule     500 mg Oral Every 8 hours 04/14/17 1140 05/14/17 2359   04/14/17 0000  metroNIDAZOLE (FLAGYL) 500 MG tablet     500 mg Oral Every 8 hours 04/14/17 1140 05/14/17 2359   04/12/17 1400  cephALEXin (KEFLEX) capsule 500 mg     500 mg Oral Every 8 hours 04/12/17 1015     04/12/17 1400  metroNIDAZOLE (FLAGYL) tablet 500 mg     500 mg Oral Every 8 hours 04/12/17 1015     04/06/17 1400  cefTRIAXone (ROCEPHIN) 2 g in dextrose 5 % 50 mL IVPB  Status:  Discontinued     2 g 100 mL/hr over 30 Minutes Intravenous Every 24 hours 04/05/17 1417 04/12/17 1015   04/05/17 1400  cefTRIAXone (ROCEPHIN) 1 g in dextrose 5 % 50 mL IVPB  Status:  Discontinued     1 g 100 mL/hr over 30 Minutes Intravenous Every 24 hours 04/04/17 1235 04/05/17 1417   04/04/17 1230  cefTRIAXone (ROCEPHIN) 2 g in dextrose 5 % 50 mL IVPB     2 g 100 mL/hr over 30 Minutes Intravenous  Once 04/04/17 1220 04/04/17 2213      Assessment/Plan 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 Michel Bickers ID following. DVT:  Lovenox    Plan:  Dressing changes;  abx per ID, she can continue to follow up after discharge with the wound clinic from our standpoint.     LOS: 12 days    Sharon Warner 04/16/2017 [phone]

## 2017-04-17 DIAGNOSIS — I5022 Chronic systolic (congestive) heart failure: Secondary | ICD-10-CM

## 2017-04-17 LAB — BASIC METABOLIC PANEL
ANION GAP: 8 (ref 5–15)
BUN: 14 mg/dL (ref 6–20)
CALCIUM: 9.9 mg/dL (ref 8.9–10.3)
CO2: 27 mmol/L (ref 22–32)
Chloride: 100 mmol/L — ABNORMAL LOW (ref 101–111)
Creatinine, Ser: 0.51 mg/dL (ref 0.44–1.00)
GFR calc Af Amer: >60 mL/min (ref >60)
Glucose, Bld: 107 mg/dL — ABNORMAL HIGH (ref 65–99)
POTASSIUM: 4.3 mmol/L (ref 3.5–5.1)
SODIUM: 135 mmol/L (ref 135–145)

## 2017-04-17 LAB — CBC WITH DIFFERENTIAL/PLATELET
BASOS ABS: 0 10*3/uL (ref 0.0–0.1)
BASOS PCT: 0 %
EOS PCT: 0 %
Eosinophils Absolute: 0.1 10*3/uL (ref 0.0–0.7)
HCT: 29.8 % — ABNORMAL LOW (ref 36.0–46.0)
Hemoglobin: 8.9 g/dL — ABNORMAL LOW (ref 12.0–15.0)
LYMPHS PCT: 27 %
Lymphs Abs: 3.4 10*3/uL (ref 0.7–4.0)
MCH: 23.2 pg — ABNORMAL LOW (ref 26.0–34.0)
MCHC: 29.9 g/dL — ABNORMAL LOW (ref 30.0–36.0)
MCV: 77.8 fL — AB (ref 78.0–100.0)
Monocytes Absolute: 1 10*3/uL (ref 0.1–1.0)
Monocytes Relative: 8 %
Neutro Abs: 8.3 10*3/uL — ABNORMAL HIGH (ref 1.7–7.7)
Neutrophils Relative %: 65 %
PLATELETS: 353 10*3/uL (ref 150–400)
RBC: 3.83 MIL/uL — AB (ref 3.87–5.11)
RDW: 17.1 % — AB (ref 11.5–15.5)
WBC: 12.8 10*3/uL — AB (ref 4.0–10.5)

## 2017-04-17 NOTE — Progress Notes (Signed)
   Progress Note  Patient Name: Evoleth Nordmeyer Date of Encounter: 04/17/2017  Primary Cardiologist:   New (Dr. Acie Fredrickson)  Subjective   She denies chest pain or SOB.   Inpatient Medications    Selected in patient meds.  I reviewed her active med list.   ASA 81 MG Midodrine 2.5 mg tid.    Vital Signs    HR 101, Temp 98.8, BP 142/80, RR 16.  Telemetry    NSR - Personally Reviewed  ECG    NA - Personally Reviewed  Physical Exam   GEN: No acute distress.   Neck: No  JVD Cardiac: RRR, no murmurs, rubs, or gallops.  Respiratory: Clear  to auscultation bilaterally. GI: Soft, nontender, non-distended  MS:   Mild edema; No deformity. Neuro:  Nonfocal  Psych: Normal affect   Labs    Chemistry Recent Labs Lab 04/15/17 0254 04/16/17 0244 04/17/17 0456  NA 134* 136 135  K 3.9 3.8 4.3  CL 96* 98* 100*  CO2 31 30 27   GLUCOSE 102* 112* 107*  BUN 25* 21* 14  CREATININE 0.48 0.55 0.51  CALCIUM 9.3 9.6 9.9  GFRNONAA >60 >60 >60  GFRAA >60 >60 >60  ANIONGAP 7 8 8      Hematology Recent Labs Lab 04/15/17 0254 04/16/17 0244 04/17/17 0456  WBC 11.4* 11.2* 12.8*  RBC 3.43* 3.73* 3.83*  HGB 8.1* 8.5* 8.9*  HCT 26.3* 28.2* 29.8*  MCV 76.7* 75.6* 77.8*  MCH 23.6* 22.8* 23.2*  MCHC 30.8 30.1 29.9*  RDW 17.0* 16.9* 17.1*  PLT 269 307 353    Cardiac EnzymesNo results for input(s): TROPONINI in the last 168 hours. No results for input(s): TROPIPOC in the last 168 hours.   BNP Recent Labs Lab 04/12/17 0334  BNP 1,317.3*     DDimer No results for input(s): DDIMER in the last 168 hours.   Radiology    No results found.  Cardiac Studies   Echocardiogram  Study Conclusions  - Left ventricle: Septal and apical akinesis. Inferior and distal   anterior wall hypokinesis. The cavity size was severely dilated.   Wall thickness was increased in a pattern of moderate LVH.   Systolic function was severely reduced. The estimated ejection   fraction was in the  range of 25% to 30%. - Aortic valve: There was trivial regurgitation. - Left atrium: The atrium was mildly dilated. - Atrial septum: No defect or patent foramen ovale was identified. - Pulmonary arteries: PA peak pressure: 33 mm Hg (S). - Pericardium, extracardiac: Small but nearly circumferential   pericardial effusion no tamponade IVC small   Patient Profile     66 y.o. female with chronic systolic and diastolic heart failure, hypertension, paraplegia, neurogenic bladder, sacral decubits ulcer, morbid obesity, and colostomy bag here with sepsis who developed acute on chronic heart failure and bradycardia.   Assessment & Plan    CHRONIC SYSTOLIC HF:  BP has been improved.  HR is a little bit better.  however this is on midodrine so unable to titrate meds.  Ronnell Guadalajara, MD  04/17/2017, 10:05 AM

## 2017-04-18 LAB — CULTURE, BLOOD (ROUTINE X 2)
Culture: NO GROWTH
SPECIAL REQUESTS: ADEQUATE

## 2017-04-18 LAB — HEMOGLOBIN A1C
HEMOGLOBIN A1C: 5 % (ref 4.8–5.6)
MEAN PLASMA GLUCOSE: 97 mg/dL

## 2017-04-19 LAB — CULTURE, BLOOD (ROUTINE X 2)
Culture: NO GROWTH
Culture: NO GROWTH
Special Requests: ADEQUATE
Special Requests: ADEQUATE

## 2017-04-21 LAB — URINALYSIS, ROUTINE W REFLEX MICROSCOPIC
Bacteria, UA: NONE SEEN
Bilirubin Urine: NEGATIVE
GLUCOSE, UA: NEGATIVE mg/dL
HGB URINE DIPSTICK: NEGATIVE
Ketones, ur: NEGATIVE mg/dL
NITRITE: NEGATIVE
PH: 6 (ref 5.0–8.0)
Protein, ur: 30 mg/dL — AB
SPECIFIC GRAVITY, URINE: 1.014 (ref 1.005–1.030)

## 2017-04-22 ENCOUNTER — Other Ambulatory Visit (HOSPITAL_COMMUNITY): Payer: Self-pay

## 2017-04-22 LAB — URINE CULTURE

## 2017-04-23 LAB — CBC WITH DIFFERENTIAL/PLATELET
BASOS ABS: 0 10*3/uL (ref 0.0–0.1)
BASOS PCT: 0 %
Eosinophils Absolute: 0 10*3/uL (ref 0.0–0.7)
Eosinophils Relative: 0 %
HEMATOCRIT: 32.2 % — AB (ref 36.0–46.0)
HEMOGLOBIN: 9.5 g/dL — AB (ref 12.0–15.0)
Lymphocytes Relative: 28 %
Lymphs Abs: 2.8 10*3/uL (ref 0.7–4.0)
MCH: 23 pg — ABNORMAL LOW (ref 26.0–34.0)
MCHC: 29.5 g/dL — ABNORMAL LOW (ref 30.0–36.0)
MCV: 78 fL (ref 78.0–100.0)
Monocytes Absolute: 0.6 10*3/uL (ref 0.1–1.0)
Monocytes Relative: 6 %
NEUTROS ABS: 6.5 10*3/uL (ref 1.7–7.7)
NEUTROS PCT: 66 %
Platelets: 506 10*3/uL — ABNORMAL HIGH (ref 150–400)
RBC: 4.13 MIL/uL (ref 3.87–5.11)
RDW: 17 % — ABNORMAL HIGH (ref 11.5–15.5)
WBC: 10 10*3/uL (ref 4.0–10.5)

## 2017-04-23 LAB — BASIC METABOLIC PANEL
ANION GAP: 8 (ref 5–15)
BUN: 21 mg/dL — ABNORMAL HIGH (ref 6–20)
CHLORIDE: 97 mmol/L — AB (ref 101–111)
CO2: 28 mmol/L (ref 22–32)
Calcium: 10.7 mg/dL — ABNORMAL HIGH (ref 8.9–10.3)
Creatinine, Ser: 0.58 mg/dL (ref 0.44–1.00)
GFR calc non Af Amer: >60 mL/min (ref >60)
Glucose, Bld: 128 mg/dL — ABNORMAL HIGH (ref 65–99)
POTASSIUM: 4.3 mmol/L (ref 3.5–5.1)
Sodium: 133 mmol/L — ABNORMAL LOW (ref 135–145)

## 2017-05-12 ENCOUNTER — Telehealth: Payer: Self-pay | Admitting: Neurology

## 2017-05-12 ENCOUNTER — Ambulatory Visit: Payer: BLUE CROSS/BLUE SHIELD | Admitting: Neurology

## 2017-05-12 NOTE — Telephone Encounter (Signed)
This patient canceled the same day of appointment, indicated that the patient was not feeling well, increased heart rate.

## 2017-05-12 NOTE — Telephone Encounter (Signed)
Pt husband has called from the back of the ambulance with pt  Being advised by staff in there that pt's vitals signes are all over, oxygen dropping and her heart rate was high.  Pt husband states he will call back with another update.  He stated they were undecided if pt should come to appointment or go to [facility].  Pt husband said that a nurse was being consulted.  Pt husband did not ask for appointment to be cancelled.

## 2017-05-12 NOTE — Telephone Encounter (Signed)
Called and spoke with husband. Made appt for 06/05/17 at 12:00pm, check in 11:45am.  States nursing home is monitoring patient right now. They are going to do chest xray. Her HR in ambulance 144 and now down to 114.  He will call if he has further questions or concerns.

## 2017-05-12 NOTE — Telephone Encounter (Signed)
I have received some medical records regarding this patient. She had a flu shot in December 2014, several days later developed weakness and buckling of the legs and fell. By January 2015, she had increased weakness and cons the bowels and bladder. MRI of the brain, cervical spine, and thoracic spine were done. All evaluations were normal, lumbar puncture was done showing 5 white blood cells and protein of 63. Sedimentation rate was 48 and C-reactive protein was 22. The patient was treated with IVIG, eventually was diagnosed with CLL. EMG and nerve conduction study did show a peripheral neuropathy. Patient lost about 100 pounds.   The patient has a presumed myelopathy, no abnormalities were seen on MRI.

## 2017-05-12 NOTE — Telephone Encounter (Signed)
Okay to work the patient sometime in the next 2 or 3 weeks.

## 2017-05-12 NOTE — Telephone Encounter (Signed)
Patient's husband calling. He states patient was coming by ambulance to her appointment with Dr. Jannifer Franklin but her heart rate was elevated so appointment was cancelled. She is back at the nursing home. The patient was rescheduled for 11-06-17 but would like a sooner appointment. She cannot come early in the morning.

## 2017-05-12 NOTE — Telephone Encounter (Signed)
Dr Willis- please advise 

## 2017-05-13 ENCOUNTER — Encounter: Payer: Self-pay | Admitting: Neurology

## 2017-05-27 ENCOUNTER — Ambulatory Visit (INDEPENDENT_AMBULATORY_CARE_PROVIDER_SITE_OTHER): Payer: BLUE CROSS/BLUE SHIELD | Admitting: Internal Medicine

## 2017-05-27 ENCOUNTER — Encounter: Payer: Self-pay | Admitting: Internal Medicine

## 2017-05-27 DIAGNOSIS — M461 Sacroiliitis, not elsewhere classified: Secondary | ICD-10-CM

## 2017-05-27 NOTE — Progress Notes (Signed)
[facility] for Infectious Disease  Patient Active Problem List   Diagnosis Date Noted  . Sacroiliitis ([place]) 04/12/2017    Priority: High  . Streptococcal bacteremia 04/12/2017    Priority: High  . Gram-negative bacteremia 04/12/2017    Priority: High  . Sacral decubitus ulcer, stage IV ([place]) 08/15/2016    Priority: High  . Paraplegia ([place]) 03/14/2016    Priority: Medium  . Acute on chronic combined systolic and diastolic CHF (congestive heart failure) ([place])   . Hypercalcemia 04/18/2016  . Protein-calorie malnutrition ([place]) 04/16/2016  . Multinodular goiter 04/16/2016  . Morbid obesity ([place]) 03/14/2016  . Type 2 diabetes mellitus ([place]) 03/14/2016  . HTN (hypertension) 03/14/2016  . Neurogenic bladder 03/14/2016  . Obstructive sleep apnea 03/14/2016  . Microcytic anemia 03/14/2016    Patient's Medications  New Prescriptions   No medications on file  Previous Medications   ASPIRIN EC 81 MG TABLET    Take 81 mg by mouth daily.   B COMPLEX VITAMINS TABLET    Take 1 tablet by mouth daily.   BIOTIN 85462 MCG TABS    Take 1,000 mcg by mouth daily.   CHOLECALCIFEROL (VITAMIN D) 1000 UNITS TABLET    Take 2,000 Units by mouth daily.    ENOXAPARIN (LOVENOX) 40 MG/0.4ML INJECTION    Inject 0.4 mLs (40 mg total) into the skin daily.   FAMOTIDINE (PEPCID) 20 MG TABLET    Take 1 tablet (20 mg total) by mouth at bedtime.   FEEDING SUPPLEMENT, ENSURE ENLIVE, (ENSURE ENLIVE) LIQD    Take 237 mLs by mouth 2 (two) times daily between meals.   INSULIN ASPART (NOVOLOG) 100 UNIT/ML INJECTION    Inject 0-15 Units into the skin 3 (three) times daily with meals.   LACTOBACILLUS TABS    250 mg by mouth bid   MELATONIN 3 MG TABS    Take 3 mg by mouth as needed (sleep).    METOPROLOL TARTRATE (LOPRESSOR) 5 MG/5ML SOLN INJECTION    Inject 2.5 mLs (2.5 mg total) into the vein every 4 (four) hours as needed (give for heart rate greater than 120, hold if sbp less than 90).   MIDODRINE  (PROAMATINE) 2.5 MG TABLET    Take 1 tablet (2.5 mg total) by mouth 3 (three) times daily with meals.   NAPROXEN SODIUM (ANAPROX) 220 MG TABLET    Take 440 mg by mouth as needed (pain temperature).   POTASSIUM CHLORIDE (KLOR-CON 10) 10 MEQ TABLET    Take 10 mEq by mouth daily.   SENNA-DOCUSATE (SENOKOT-S) 8.6-50 MG TABLET    Take 1 tablet by mouth at bedtime.   TRAMADOL-ACETAMINOPHEN (ULTRACET) 37.5-325 MG TABLET    Take 1 tablet by mouth every 4 (four) hours as needed. for pain   ZINC GLUCONATE 50 MG TABLET    Take 50 mg by mouth daily.  Modified Medications   No medications on file  Discontinued Medications   No medications on file    Subjective: Ms. Chawla Is in for her hospital follow-up visit. She is a 66 y.o. 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 She moved to [place] about one year ago. Shortly before the move she developed a sacral decubitus ulcer. She was hospitalized here last year with sacral osteomyelitis. That resolved after 6 weeks of IV ceftriaxone and oral metronidazole. She was released from [facility] sometime last year and has been living  at home with her husband. [facility] home care nurses come 3 times each week to help care for her wound. She understands that her wound has been healing slowly. She recently developed fever and chills leading to her admission on 04/04/2017. No new wound drainage or infection was noted. She was treated with IV ceftriaxone for presumed UTI. Urine culture grew multiple organisms. A pelvic CT scan was done which showed the decubitus ulcer and some enhancement and fluid around the left sacroiliac joint. One of 2 admission blood cultures grew Proteus and group B strep. I elected to treat her with oral cephalexin and metronidazole for 6 weeks. She has completed therapy and is feeling better. She has been told that her wound is getting better. There has been no unusual drainage. She is not  having any pain, fever, chills or diarrhea.  Review of Systems: Review of Systems  Constitutional: Negative for chills, diaphoresis and fever.  Gastrointestinal: Negative for abdominal pain, diarrhea, nausea and vomiting.  Musculoskeletal: Negative for back pain.    Past Medical History:  Diagnosis Date  . Acute on chronic systolic CHF (congestive heart failure) ([place])   . Chronic heart failure ([place])   . Chronic indwelling Foley catheter    last changed few days ago  . Diabetes mellitus without complication ([place])   . Enlarged thoracic aorta ([place]) 03/14/2016  . H/O paraplegia    ascending paralysis unclear etiology  . Hypertension   . Lumbar spondylosis   . Morbid obesity ([place])   . OSA on CPAP   . Sacral decubitus ulcer, stage IV ([place])     Social History  Substance Use Topics  . Smoking status: Never Smoker  . Smokeless tobacco: Never Used  . Alcohol use No    Family History  Problem Relation Age of Onset  . Hypothyroidism Mother   . Diabetes Maternal Grandmother   . Heart disease Maternal Grandfather     Allergies  Allergen Reactions  . Levaquin [Levofloxacin In D5w] Nausea Only    Unknown childhood allergy  . Penicillins Other (See Comments)    Has patient had a PCN reaction causing immediate rash, facial/tongue/throat swelling, SOB or lightheadedness with hypotension: Unknown Has patient had a PCN reaction causing severe rash involving mucus membranes or skin necrosis: Unknown Has patient had a PCN reaction that required hospitalization: No Has patient had a PCN reaction occurring within the last 10 years: No If all of the above answers are "NO", then may proceed with Cephalosporin use.    Objective: Vitals:   05/27/17 0916  BP: 103/69  Pulse: 80  Temp: 98.1 F (36.7 C)  TempSrc: Oral   There is no height or weight on file to calculate BMI.  Physical Exam  Constitutional: She is oriented to person, place, and time.  She is in good spirits.    Musculoskeletal:  She has a silver dollar size sacral decubitus. The wound base is pink and relatively clean. There is undermining around the wound of about 1 inch. There is only a scant amount of thin bloody drainage on the gauze dressing. There is no odor.  Neurological: She is alert and oriented to person, place, and time.  Skin: No rash noted.  Psychiatric: Mood and affect normal.    Lab Results    Problem List Items Addressed This Visit      High   Sacroiliitis ([place])    I suspect that her polymicrobial bacteremia and sacroiliitis has been cured. I will have her continue off  of antibiotics and follow-up here as needed.          Michel Bickers, MD [facility] [phone] pager   [phone] cell 05/27/2017, 9:50 AM

## 2017-05-27 NOTE — Assessment & Plan Note (Signed)
I suspect that her polymicrobial bacteremia and sacroiliitis has been cured. I will have her continue off of antibiotics and follow-up here as needed.

## 2017-06-05 ENCOUNTER — Ambulatory Visit (INDEPENDENT_AMBULATORY_CARE_PROVIDER_SITE_OTHER): Payer: BLUE CROSS/BLUE SHIELD | Admitting: Neurology

## 2017-06-05 ENCOUNTER — Encounter: Payer: Self-pay | Admitting: Neurology

## 2017-06-05 VITALS — BP 110/74 | HR 72 | Ht 61.0 in

## 2017-06-05 DIAGNOSIS — G8220 Paraplegia, unspecified: Secondary | ICD-10-CM

## 2017-06-05 NOTE — Patient Instructions (Signed)
   You will need daily range of movement of the legs to maintain hip and knee flexability.

## 2017-06-05 NOTE — Progress Notes (Signed)
Reason for visit: Transverse myelitis  Sharon Warner is an 66 y.o. female  History of present illness:  Sharon Warner is a 66 year old right-handed white female with a history of a paraparesis that developed in January 2015 several weeks after a flu shot in December 2014. The patient developed a T5 or T6 level paraparesis. The patient is nonambulatory. MRI evaluation of the entire neuro axis did not show evidence of any abnormalities. The exact etiology of the paraparesis was never determined. The patient has had difficulty with a stage IV sacral decubitus ulcer, she had surgery on this in December 2017, she was in the hospital with sepsis on 04/04/2017. She has seen infectious disease. The patient is now healing, she is followed through a surgeon for the decubitus ulcer, she currently is in an extended care facility but will be returning home within the next several weeks. She is not yet to the point where she is able to sit in a wheelchair. The patient had been getting physical therapy until just recently. She denies any pain in the lower extremities, she is sleeping well at night.  Past Medical History:  Diagnosis Date  . Acute on chronic systolic CHF (congestive heart failure) ([place])   . Chronic heart failure ([place])   . Chronic indwelling Foley catheter    last changed few days ago  . Diabetes mellitus without complication ([place])   . Enlarged thoracic aorta ([place]) 03/14/2016  . H/O paraplegia    ascending paralysis unclear etiology  . Hypertension   . Lumbar spondylosis   . Morbid obesity ([place])   . OSA on CPAP   . Sacral decubitus ulcer, stage IV [facility])     Past Surgical History:  Procedure Laterality Date  . JOINT REPLACEMENT Right    Hip  . TONSILLECTOMY    . VAGINAL HYSTERECTOMY      Family History  Problem Relation Age of Onset  . Hypothyroidism Mother   . Diabetes Maternal Grandmother   . Heart disease Maternal Grandfather     Social history:  reports that she has never  smoked. She has never used smokeless tobacco. She reports that she does not drink alcohol or use drugs.    Allergies  Allergen Reactions  . Influenza Vaccines   . Levaquin [Levofloxacin In D5w] Nausea Only    Unknown childhood allergy  . Penicillins Other (See Comments)    Has patient had a PCN reaction causing immediate rash, facial/tongue/throat swelling, SOB or lightheadedness with hypotension: Unknown Has patient had a PCN reaction causing severe rash involving mucus membranes or skin necrosis: Unknown Has patient had a PCN reaction that required hospitalization: No Has patient had a PCN reaction occurring within the last 10 years: No If all of the above answers are "NO", then may proceed with Cephalosporin use.  . Simvastatin     Medications:  Prior to Admission medications   Medication Sig Start Date End Date Taking? Authorizing Provider  aspirin EC 81 MG tablet Take 81 mg by mouth daily.   Yes [provider]  famotidine (PEPCID) 20 MG tablet Take 1 tablet (20 mg total) by mouth at bedtime. Patient taking differently: Take 20 mg by mouth 2 (two) times daily.  04/14/17  Yes Florencia Reasons, MD  Lactobacillus TABS 250 mg by mouth bid   Yes [provider]  metoprolol succinate (TOPROL-XL) 25 MG 24 hr tablet Take 12.5 mg by mouth daily.   Yes [provider]  midodrine (PROAMATINE) 2.5 MG tablet  Take 1 tablet (2.5 mg total) by mouth 3 (three) times daily with meals. 04/16/17  Yes Sheikh, Omair Latif, DO  Olopatadine HCl (PATADAY OP) Place 1 drop into both eyes 2 (two) times daily.   Yes [provider]  potassium chloride (KLOR-CON 10) 10 MEQ tablet Take 10 mEq by mouth daily.   Yes [provider]  senna-docusate (SENOKOT-S) 8.6-50 MG tablet Take 1 tablet by mouth at bedtime. 04/14/17  Yes Florencia Reasons, MD  sertraline (ZOLOFT) 100 MG tablet Take 100 mg by mouth daily.   Yes [provider]  zinc sulfate 220 (50 Zn) MG capsule Take 220 mg by  mouth daily.   Yes [provider]    ROS:  Out of a complete 14 system review of symptoms, the patient complains only of the following symptoms, and all other reviewed systems are negative.  Leg weakness and numbness  Blood pressure 110/74, pulse 72, height 5\' 1"  (1.549 m), SpO2 97 %.  Physical Exam  General: The patient is alert and cooperative at the time of the examination.  Skin: No significant peripheral edema is noted.   Neurologic Exam  Mental status: The patient is alert and oriented x 3 at the time of the examination. The patient has apparent normal recent and remote memory, with an apparently normal attention span and concentration ability.   Cranial nerves: Facial symmetry is present. Speech is normal, no aphasia or dysarthria is noted. Extraocular movements are full. Visual fields are full.  Motor: The patient has good strength in the upper extremities. The patient has no voluntary movement of the lower extremities, with painful stimulation of the feet, the patient has flexion of the knees bilaterally.  Sensory examination: Soft touch sensation is symmetric on the face and arms. The patient has a T6 sensory level, not much sensation below this level. The patient does have some pain sensation in the right foot, none on the left.  Coordination: The patient has good finger-nose-finger bilaterally.  Gait and station: The patient is unable to ambulate.  Reflexes: Deep tendon reflexes are symmetric, the reflexes in the arms are somewhat brisk.   Assessment/Plan:  1. Thoracic myelopathy, T5 or T6 level  2. Paraplegia  The patient fortunately has no pain associated with her spinal cord injury. The patient has a healing sacral decubitus ulcer. I have recommended that they continue to do range of movement exercises on both legs twice a day to prevent freezing of the hip or knee joints. The patient will follow-up in about 6 months. At some point she may require a  mobility assessment for a wheelchair.  Jill Alexanders MD 06/05/2017 11:55 AM  [facility] [address] [place], [place] [address]  Phone [phone] Fax [phone]

## 2017-06-26 ENCOUNTER — Ambulatory Visit: Admitting: Internal Medicine

## 2017-08-20 IMAGING — CT CT NECK W/ CM
4 of 5 series · 14 of 35 positions shown, 17 images · IV contrast (iopamidol)
Comparison: Chest radiograph April 07, 2017 and CT chest April 05, 2017

CLINICAL DATA: Dysphagia. History of paraplegia, neurogenic bladder
with indwelling catheter, colostomy bag, sacral osteomyelitis,
diabetes.

EXAM:
CT NECK WITH CONTRAST
TECHNIQUE: Multidetector CT imaging of the neck was performed using the
standard protocol following the bolus administration of intravenous
contrast.
CONTRAST:  75mL 0VAFFK-O00 IOPAMIDOL (0VAFFK-O00) INJECTION 61%

[Series 3: axial neck · axial · 0.51mm/px · z∈[+1431,+1587]mm · 5 of 118 slices shown, 7 images]
[im 20/118  soft-tissue]
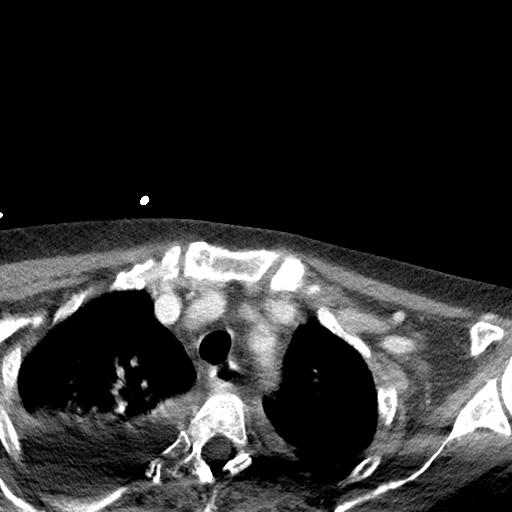
[im 20/118  bone]
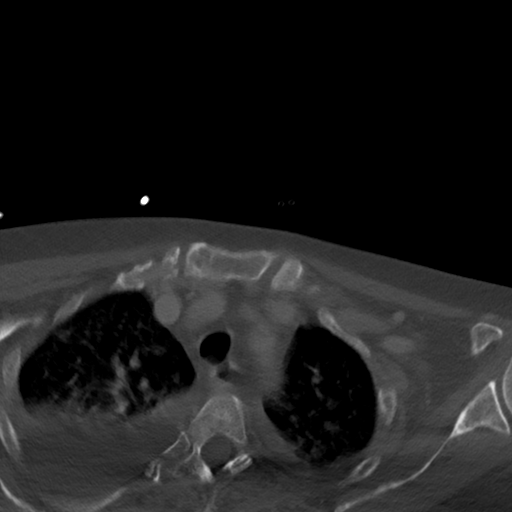
[im 40/118  bone]
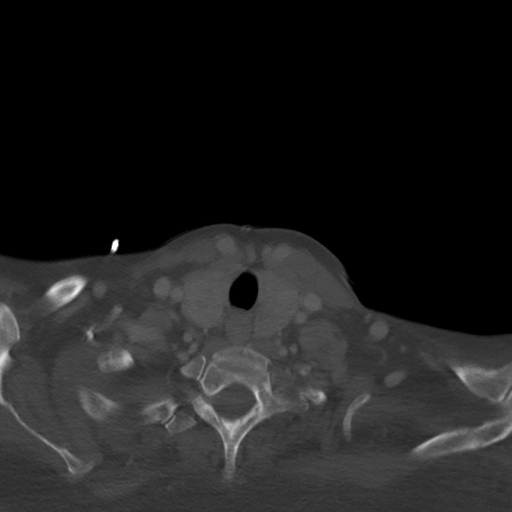
[im 59/118  bone]
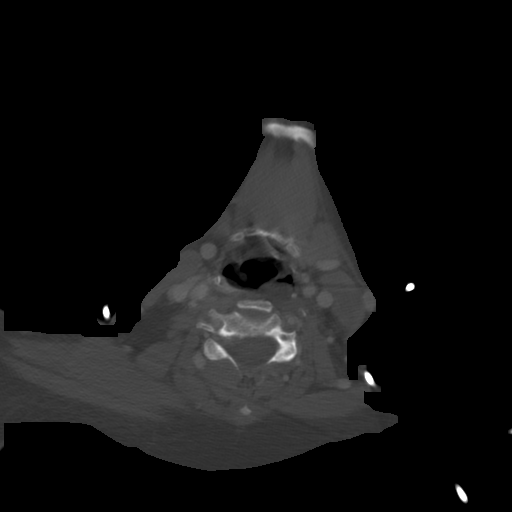
[im 79/118  bone]
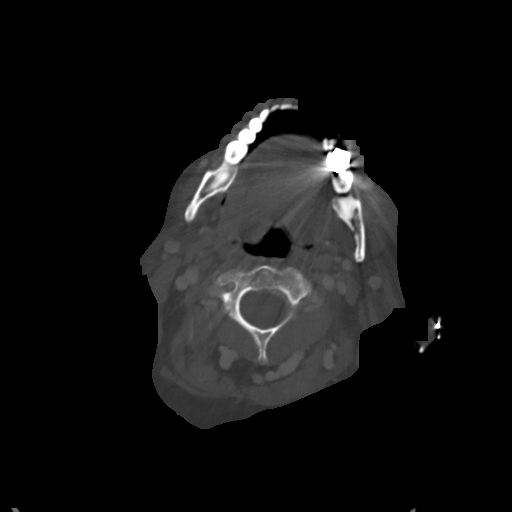
[im 98/118  soft-tissue]
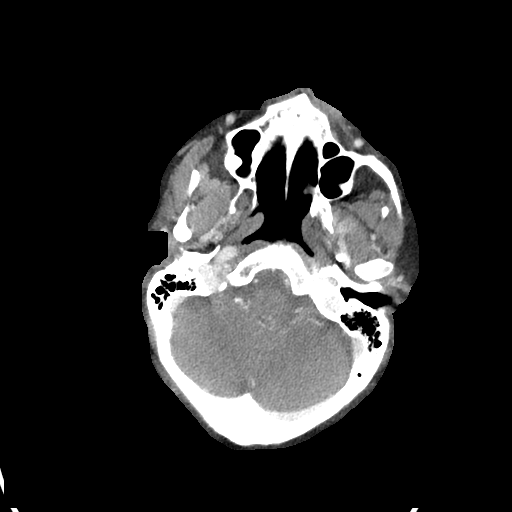
[im 98/118  bone]
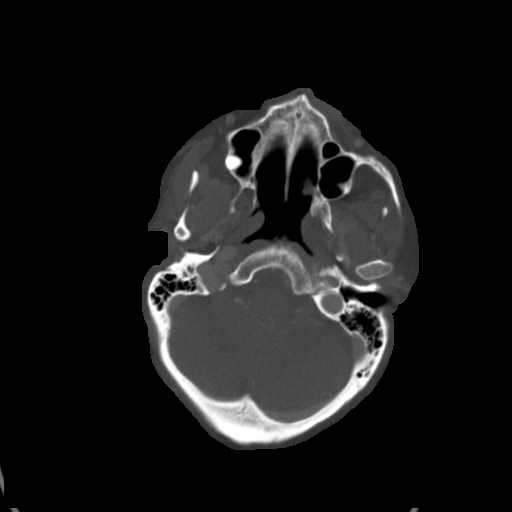

[Series 7: orthogonal ax · axial · 0.39mm/px · 1 of 103 slices shown]
[im 26/103  bone]
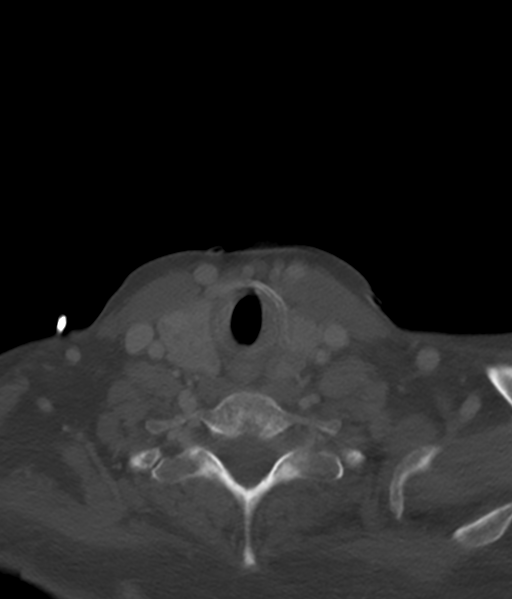

[Series 8: cor neck · coronal · 0.39mm/px · 3 of 117 slices shown]
[im 24/117  bone]
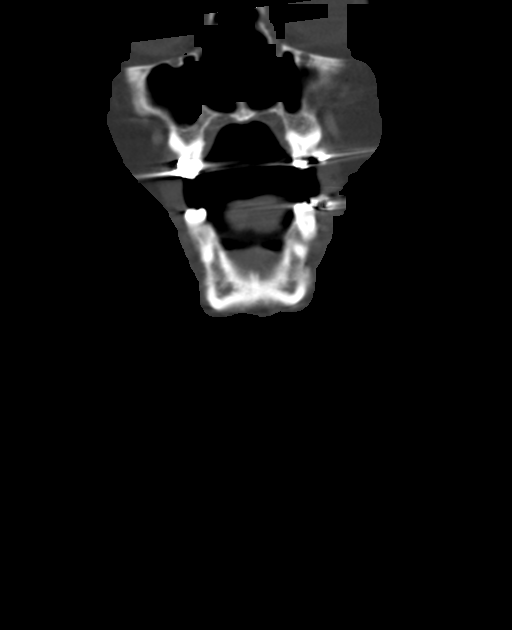
[im 47/117  bone]
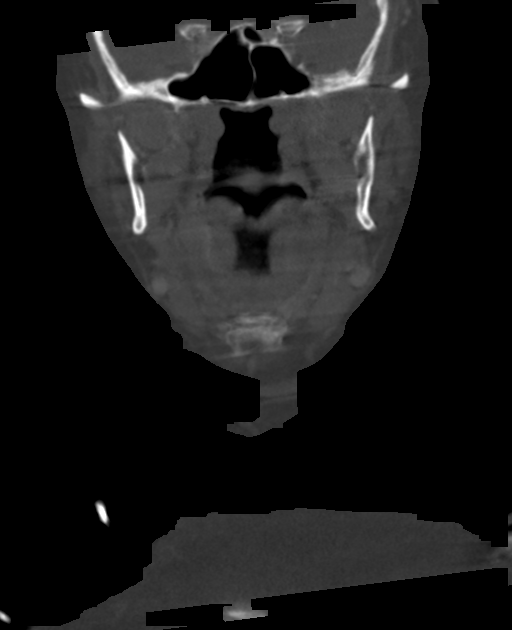
[im 70/117  bone]
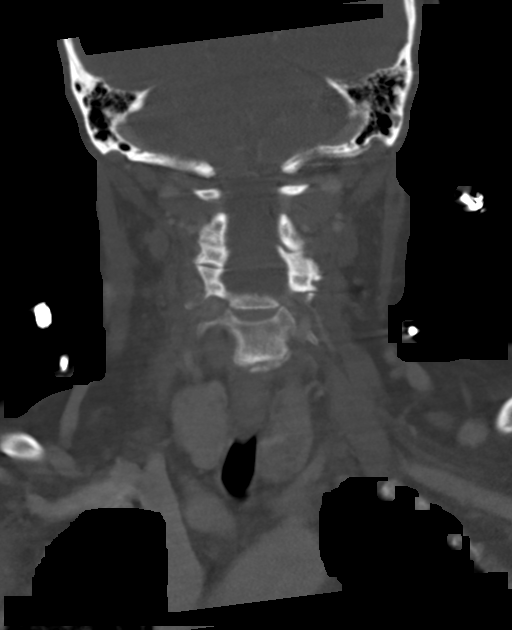

[Series 9: sag neck · sagittal · 0.46mm/px · 5 of 101 slices shown, 6 images]
[im 34/101  bone]
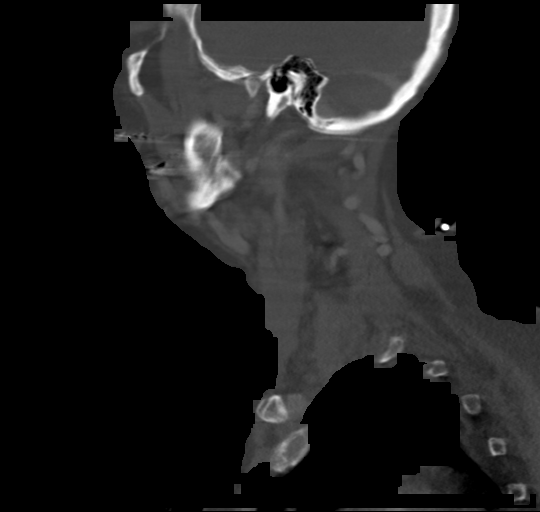
[im 42/101  bone]
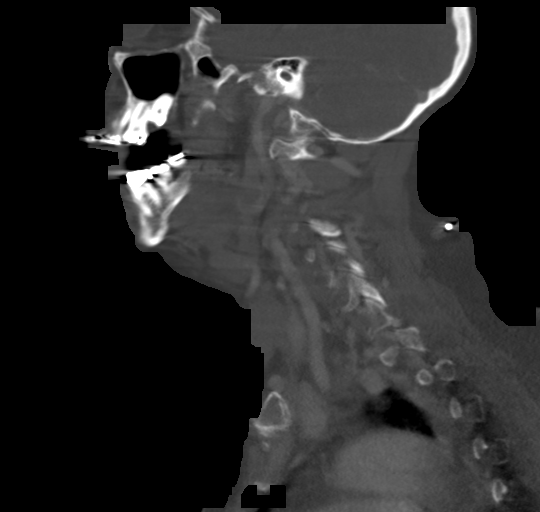
[im 51/101  soft-tissue]
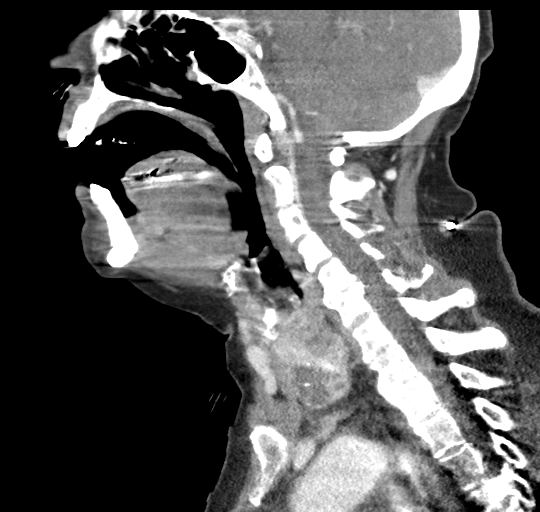
[im 51/101  bone]
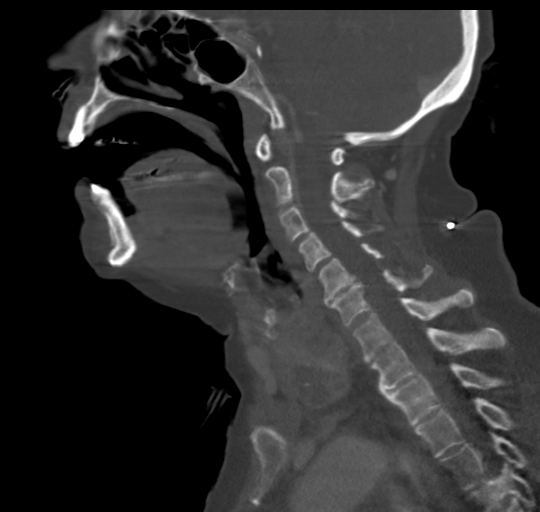
[im 59/101  bone]
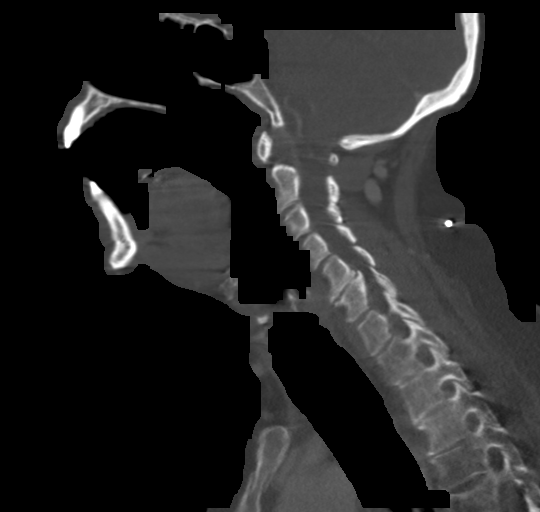
[im 67/101  bone]
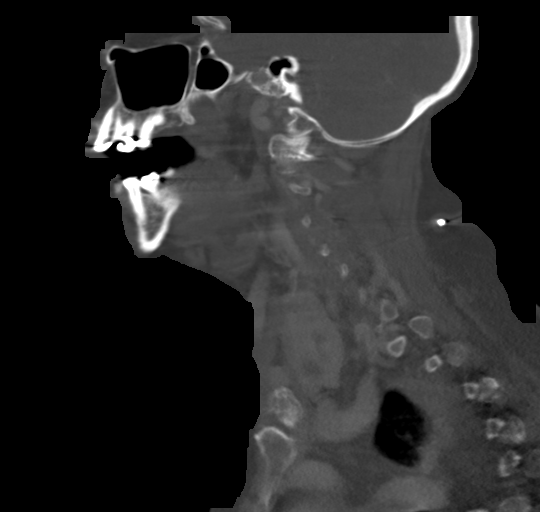

[14 of 35 positions shown; findings below may reference images not displayed]

FINDINGS: Moderately motion degraded examination. Streak artifact from
multiple overlying lines.

PHARYNX AND LARYNX: Normal.  Widely patent airway.

SALIVARY GLANDS: Normal.

THYROID: 2 x 2 cm complex LEFT thyroid nodule with coarse
calcifications. Smaller RIGHT thyroid nodule present. Heterogeneous
thyroid wound.

LYMPH NODES: No lymphadenopathy by CT size criteria.

VASCULAR: Normal.

LIMITED INTRACRANIAL: Normal.

VISUALIZED ORBITS: Normal.

MASTOIDS AND VISUALIZED PARANASAL SINUSES: Well-aerated.

SKELETON: Nonacute. Severe C5-6 disc height loss and endplate
spurring compatible with degenerative discs.

UPPER CHEST: Large RIGHT and small LEFT pleural effusions with
venous congestion incompletely assessed.

OTHER: None.
IMPRESSION: 1. No acute process in the neck on this moderately motion degraded
examination. Patent airway.
2. Increasing large RIGHT and small LEFT pleural effusions, and
congestion consistent with pulmonary edema.
3. **An incidental finding of potential clinical significance has
been found. 2.1 cm LEFT thyroid nodule. Recommend thyroid ultrasound
on nonemergent basis. This follows ACR consensus guidelines:
Managing Incidental Thyroid Nodules Detected on Imaging: White Paper
of [REDACTED]. [HOSPITAL]

## 2017-08-24 IMAGING — DX DG CHEST 1V PORT
1 series · 1 of 1 positions shown · non-contrast
Comparison: 04/07/2017

CLINICAL DATA: Shortness of breath today

EXAM:
PORTABLE CHEST 1 VIEW

[chest ap]
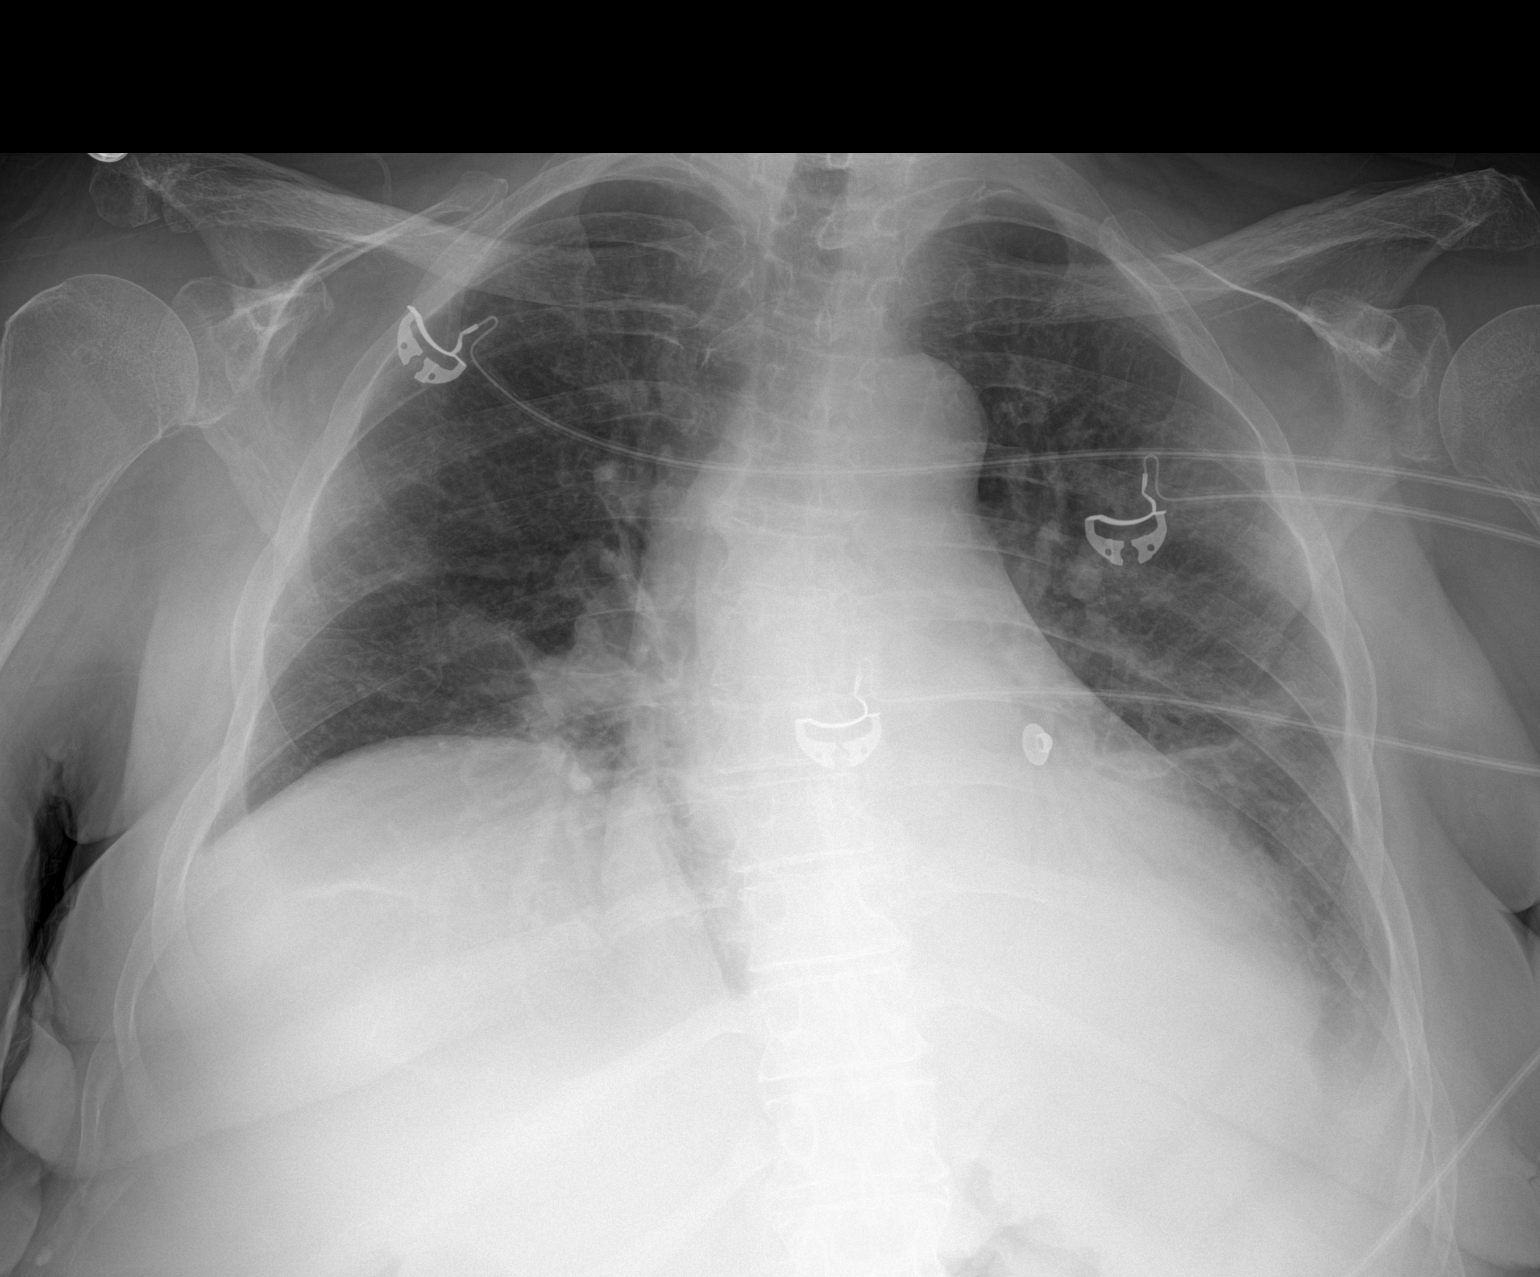

[1 of 1 positions shown; findings below may reference images not displayed]

FINDINGS: Cardiomegaly with vascular congestion. Left lower lobe atelectasis
or infiltrate is similar to prior study. Probable small left
effusion. No overt edema. No acute bony abnormality.
IMPRESSION: Small left effusion with left lower lobe atelectasis or infiltrate.

Cardiomegaly.

## 2017-08-28 ENCOUNTER — Encounter (HOSPITAL_COMMUNITY): Payer: Self-pay

## 2017-08-28 ENCOUNTER — Emergency Department (HOSPITAL_COMMUNITY): Payer: BLUE CROSS/BLUE SHIELD

## 2017-08-28 ENCOUNTER — Emergency Department (HOSPITAL_COMMUNITY)
Admit: 2017-08-28 | Discharge: 2017-08-28 | Disposition: A | Discharge status: Home / Self Care | Payer: BLUE CROSS/BLUE SHIELD | Attending: Emergency Medicine | Admitting: Emergency Medicine

## 2017-08-28 ENCOUNTER — Inpatient Hospital Stay (HOSPITAL_COMMUNITY)
Admission: EM | Admit: 2017-08-28 | Discharge: 2017-09-03 | DRG: 698 | Disposition: A | Discharge status: Home Health | Payer: BLUE CROSS/BLUE SHIELD | Attending: Internal Medicine | Admitting: Internal Medicine

## 2017-08-28 DIAGNOSIS — Z7982 Long term (current) use of aspirin: Secondary | ICD-10-CM | POA: Diagnosis not present

## 2017-08-28 DIAGNOSIS — Z8249 Family history of ischemic heart disease and other diseases of the circulatory system: Secondary | ICD-10-CM | POA: Diagnosis not present

## 2017-08-28 DIAGNOSIS — A419 Sepsis, unspecified organism: Secondary | ICD-10-CM | POA: Diagnosis present

## 2017-08-28 DIAGNOSIS — T17890A Other foreign object in other parts of respiratory tract causing asphyxiation, initial encounter: Secondary | ICD-10-CM | POA: Diagnosis present

## 2017-08-28 DIAGNOSIS — J9601 Acute respiratory failure with hypoxia: Secondary | ICD-10-CM | POA: Diagnosis present

## 2017-08-28 DIAGNOSIS — N319 Neuromuscular dysfunction of bladder, unspecified: Secondary | ICD-10-CM | POA: Diagnosis present

## 2017-08-28 DIAGNOSIS — R6520 Severe sepsis without septic shock: Secondary | ICD-10-CM | POA: Diagnosis present

## 2017-08-28 DIAGNOSIS — Z993 Dependence on wheelchair: Secondary | ICD-10-CM

## 2017-08-28 DIAGNOSIS — T83511A Infection and inflammatory reaction due to indwelling urethral catheter, initial encounter: Secondary | ICD-10-CM | POA: Diagnosis not present

## 2017-08-28 DIAGNOSIS — Z881 Allergy status to other antibiotic agents status: Secondary | ICD-10-CM

## 2017-08-28 DIAGNOSIS — I110 Hypertensive heart disease with heart failure: Secondary | ICD-10-CM | POA: Diagnosis present

## 2017-08-28 DIAGNOSIS — Y846 Urinary catheterization as the cause of abnormal reaction of the patient, or of later complication, without mention of misadventure at the time of the procedure: Secondary | ICD-10-CM | POA: Diagnosis present

## 2017-08-28 DIAGNOSIS — Z833 Family history of diabetes mellitus: Secondary | ICD-10-CM | POA: Diagnosis not present

## 2017-08-28 DIAGNOSIS — G4733 Obstructive sleep apnea (adult) (pediatric): Secondary | ICD-10-CM | POA: Diagnosis present

## 2017-08-28 DIAGNOSIS — J069 Acute upper respiratory infection, unspecified: Secondary | ICD-10-CM | POA: Diagnosis not present

## 2017-08-28 DIAGNOSIS — I5022 Chronic systolic (congestive) heart failure: Secondary | ICD-10-CM | POA: Diagnosis present

## 2017-08-28 DIAGNOSIS — N390 Urinary tract infection, site not specified: Secondary | ICD-10-CM

## 2017-08-28 DIAGNOSIS — G373 Acute transverse myelitis in demyelinating disease of central nervous system: Secondary | ICD-10-CM | POA: Diagnosis present

## 2017-08-28 DIAGNOSIS — Z880 Allergy status to penicillin: Secondary | ICD-10-CM

## 2017-08-28 DIAGNOSIS — Z7401 Bed confinement status: Secondary | ICD-10-CM | POA: Diagnosis not present

## 2017-08-28 DIAGNOSIS — Z933 Colostomy status: Secondary | ICD-10-CM

## 2017-08-28 DIAGNOSIS — I272 Pulmonary hypertension, unspecified: Secondary | ICD-10-CM | POA: Diagnosis present

## 2017-08-28 DIAGNOSIS — E876 Hypokalemia: Secondary | ICD-10-CM | POA: Diagnosis present

## 2017-08-28 DIAGNOSIS — Z96641 Presence of right artificial hip joint: Secondary | ICD-10-CM | POA: Diagnosis present

## 2017-08-28 DIAGNOSIS — Z887 Allergy status to serum and vaccine status: Secondary | ICD-10-CM

## 2017-08-28 DIAGNOSIS — R1310 Dysphagia, unspecified: Secondary | ICD-10-CM | POA: Diagnosis present

## 2017-08-28 DIAGNOSIS — R0902 Hypoxemia: Secondary | ICD-10-CM | POA: Diagnosis not present

## 2017-08-28 DIAGNOSIS — G8220 Paraplegia, unspecified: Secondary | ICD-10-CM | POA: Diagnosis present

## 2017-08-28 DIAGNOSIS — I959 Hypotension, unspecified: Secondary | ICD-10-CM | POA: Diagnosis not present

## 2017-08-28 DIAGNOSIS — Z888 Allergy status to other drugs, medicaments and biological substances status: Secondary | ICD-10-CM

## 2017-08-28 DIAGNOSIS — R0602 Shortness of breath: Secondary | ICD-10-CM

## 2017-08-28 DIAGNOSIS — Z90710 Acquired absence of both cervix and uterus: Secondary | ICD-10-CM

## 2017-08-28 DIAGNOSIS — E119 Type 2 diabetes mellitus without complications: Secondary | ICD-10-CM | POA: Diagnosis present

## 2017-08-28 DIAGNOSIS — E042 Nontoxic multinodular goiter: Secondary | ICD-10-CM | POA: Diagnosis present

## 2017-08-28 DIAGNOSIS — L89154 Pressure ulcer of sacral region, stage 4: Secondary | ICD-10-CM | POA: Diagnosis present

## 2017-08-28 DIAGNOSIS — M7989 Other specified soft tissue disorders: Secondary | ICD-10-CM | POA: Diagnosis not present

## 2017-08-28 DIAGNOSIS — J189 Pneumonia, unspecified organism: Secondary | ICD-10-CM | POA: Diagnosis present

## 2017-08-28 DIAGNOSIS — J9621 Acute and chronic respiratory failure with hypoxia: Secondary | ICD-10-CM | POA: Diagnosis not present

## 2017-08-28 LAB — URINALYSIS, ROUTINE W REFLEX MICROSCOPIC
Bilirubin Urine: NEGATIVE
Glucose, UA: NEGATIVE mg/dL
Ketones, ur: 5 mg/dL — AB
Nitrite: POSITIVE — AB
PROTEIN: 100 mg/dL — AB
SPECIFIC GRAVITY, URINE: 1.011 (ref 1.005–1.030)
SQUAMOUS EPITHELIAL / LPF: NONE SEEN
pH: 6 (ref 5.0–8.0)

## 2017-08-28 LAB — BASIC METABOLIC PANEL
ANION GAP: 10 (ref 5–15)
BUN: 24 mg/dL — ABNORMAL HIGH (ref 6–20)
CALCIUM: 11.4 mg/dL — AB (ref 8.9–10.3)
CO2: 21 mmol/L — AB (ref 22–32)
CREATININE: 0.74 mg/dL (ref 0.44–1.00)
Chloride: 102 mmol/L (ref 101–111)
GLUCOSE: 84 mg/dL (ref 65–99)
Potassium: 3.4 mmol/L — ABNORMAL LOW (ref 3.5–5.1)
Sodium: 133 mmol/L — ABNORMAL LOW (ref 135–145)

## 2017-08-28 LAB — CBC
HCT: 33.6 % — ABNORMAL LOW (ref 36.0–46.0)
HEMOGLOBIN: 10 g/dL — AB (ref 12.0–15.0)
MCH: 21.4 pg — AB (ref 26.0–34.0)
MCHC: 29.8 g/dL — AB (ref 30.0–36.0)
MCV: 71.8 fL — ABNORMAL LOW (ref 78.0–100.0)
PLATELETS: 380 10*3/uL (ref 150–400)
RBC: 4.68 MIL/uL (ref 3.87–5.11)
RDW: 18.4 % — ABNORMAL HIGH (ref 11.5–15.5)
WBC: 11.7 10*3/uL — ABNORMAL HIGH (ref 4.0–10.5)

## 2017-08-28 LAB — TROPONIN I

## 2017-08-28 LAB — D-DIMER, QUANTITATIVE: D-Dimer, Quant: 1.25 ug/mL-FEU — ABNORMAL HIGH (ref 0.00–0.50)

## 2017-08-28 LAB — I-STAT TROPONIN, ED: TROPONIN I, POC: 0 ng/mL (ref 0.00–0.08)

## 2017-08-28 LAB — I-STAT CG4 LACTIC ACID, ED: LACTIC ACID, VENOUS: 0.72 mmol/L (ref 0.5–1.9)

## 2017-08-28 MED ORDER — HYDROCODONE-ACETAMINOPHEN 7.5-325 MG PO TABS
1.0000 | ORAL_TABLET | Freq: Every day | ORAL | Status: DC | PRN
  Administered 2017-08-29: 1 via ORAL
  Filled 2017-08-28: qty 1

## 2017-08-28 MED ORDER — CENTRUM SILVER 50+WOMEN PO TABS: 1.0000 | ORAL_TABLET | Freq: Every day | ORAL | Status: DC

## 2017-08-28 MED ORDER — ENOXAPARIN SODIUM 40 MG/0.4ML ~~LOC~~ SOLN
40.0000 mg | SUBCUTANEOUS | Status: DC
  Administered 2017-08-29 – 2017-09-02 (×5): 40 mg via SUBCUTANEOUS
  Filled 2017-08-28 (×5): qty 0.4

## 2017-08-28 MED ORDER — ZINC SULFATE 220 (50 ZN) MG PO CAPS
220.0000 mg | ORAL_CAPSULE | Freq: Every day | ORAL | Status: DC
  Administered 2017-08-28 – 2017-09-03 (×7): 220 mg via ORAL
  Filled 2017-08-28 (×7): qty 1

## 2017-08-28 MED ORDER — SODIUM CHLORIDE 0.9 % IV SOLN
INTRAVENOUS | Status: DC
  Administered 2017-08-28: 22:00:00 via INTRAVENOUS

## 2017-08-28 MED ORDER — DAKINS (1/4 STRENGTH) 0.125 % EX SOLN: CUTANEOUS | Status: DC

## 2017-08-28 MED ORDER — NAPROXEN 250 MG PO TABS
500.0000 mg | ORAL_TABLET | Freq: Every day | ORAL | Status: DC | PRN
  Filled 2017-08-28: qty 1

## 2017-08-28 MED ORDER — VITAMIN D 1000 UNITS PO TABS
1000.0000 [IU] | ORAL_TABLET | Freq: Every day | ORAL | Status: DC
  Administered 2017-08-29 – 2017-09-02 (×4): 1000 [IU] via ORAL
  Filled 2017-08-28 (×6): qty 1

## 2017-08-28 MED ORDER — IOPAMIDOL (ISOVUE-370) INJECTION 76%
INTRAVENOUS | Status: AC
  Administered 2017-08-29: 100 mL
  Filled 2017-08-28: qty 100

## 2017-08-28 MED ORDER — FLUTICASONE PROPIONATE 50 MCG/ACT NA SUSP
1.0000 | Freq: Every day | NASAL | Status: DC
  Administered 2017-08-28 – 2017-09-03 (×7): 1 via NASAL
  Filled 2017-08-28 (×2): qty 16

## 2017-08-28 MED ORDER — MIDODRINE HCL 2.5 MG PO TABS: 2.5000 mg | ORAL_TABLET | Freq: Three times a day (TID) | ORAL | Status: DC

## 2017-08-28 MED ORDER — METOPROLOL SUCCINATE ER 25 MG PO TB24
12.5000 mg | ORAL_TABLET | Freq: Every day | ORAL | Status: DC
  Administered 2017-08-29: 12.5 mg via ORAL
  Filled 2017-08-28: qty 1

## 2017-08-28 MED ORDER — ACETAMINOPHEN 650 MG RE SUPP: 650.0000 mg | Freq: Four times a day (QID) | RECTAL | Status: DC | PRN

## 2017-08-28 MED ORDER — LACTOBACILLUS PO TABS: 1.0000 | ORAL_TABLET | Freq: Two times a day (BID) | ORAL | Status: DC

## 2017-08-28 MED ORDER — BACID PO TABS
1.0000 | ORAL_TABLET | Freq: Two times a day (BID) | ORAL | Status: DC
  Administered 2017-08-28 – 2017-09-03 (×12): 1 via ORAL
  Filled 2017-08-28 (×13): qty 1

## 2017-08-28 MED ORDER — ASPIRIN EC 81 MG PO TBEC
81.0000 mg | DELAYED_RELEASE_TABLET | Freq: Every day | ORAL | Status: DC
  Administered 2017-08-29 – 2017-09-03 (×5): 81 mg via ORAL
  Filled 2017-08-28 (×6): qty 1

## 2017-08-28 MED ORDER — OLOPATADINE HCL 0.1 % OP SOLN: 1.0000 [drp] | Freq: Two times a day (BID) | OPHTHALMIC | Status: DC | PRN

## 2017-08-28 MED ORDER — CICLOPIROX 8 % EX SOLN: 1.0000 "application " | CUTANEOUS | Status: DC

## 2017-08-28 MED ORDER — TRAMADOL-ACETAMINOPHEN 37.5-325 MG PO TABS: 1.0000 | ORAL_TABLET | ORAL | Status: DC | PRN

## 2017-08-28 MED ORDER — ONDANSETRON HCL 4 MG/2ML IJ SOLN
4.0000 mg | Freq: Four times a day (QID) | INTRAMUSCULAR | Status: DC | PRN
  Administered 2017-08-29: 4 mg via INTRAVENOUS
  Filled 2017-08-28: qty 2

## 2017-08-28 MED ORDER — ACETAMINOPHEN 325 MG PO TABS
650.0000 mg | ORAL_TABLET | Freq: Four times a day (QID) | ORAL | Status: DC | PRN
  Administered 2017-08-29 – 2017-08-31 (×2): 650 mg via ORAL
  Filled 2017-08-28 (×2): qty 2

## 2017-08-28 MED ORDER — FERROUS SULFATE 325 (65 FE) MG PO TABS
325.0000 mg | ORAL_TABLET | Freq: Every day | ORAL | Status: DC
  Administered 2017-08-28 – 2017-09-03 (×6): 325 mg via ORAL
  Filled 2017-08-28 (×8): qty 1

## 2017-08-28 MED ORDER — DEXTROSE 5 % IV SOLN: 1.0000 g | Freq: Once | INTRAVENOUS | Status: DC

## 2017-08-28 MED ORDER — DEXTROSE 5 % IV SOLN: 1.0000 g | INTRAVENOUS | Status: DC

## 2017-08-28 MED ORDER — ADULT MULTIVITAMIN W/MINERALS CH
1.0000 | ORAL_TABLET | Freq: Every day | ORAL | Status: DC
  Administered 2017-08-28 – 2017-09-03 (×5): 1 via ORAL
  Filled 2017-08-28 (×7): qty 1

## 2017-08-28 MED ORDER — POTASSIUM CHLORIDE CRYS ER 20 MEQ PO TBCR
20.0000 meq | EXTENDED_RELEASE_TABLET | Freq: Once | ORAL | Status: AC
  Administered 2017-08-28: 20 meq via ORAL
  Filled 2017-08-28: qty 1

## 2017-08-28 MED ORDER — SENNOSIDES-DOCUSATE SODIUM 8.6-50 MG PO TABS
1.0000 | ORAL_TABLET | Freq: Every evening | ORAL | Status: DC | PRN
  Filled 2017-08-28: qty 1

## 2017-08-28 MED ORDER — ONDANSETRON HCL 4 MG PO TABS: 4.0000 mg | ORAL_TABLET | Freq: Four times a day (QID) | ORAL | Status: DC | PRN

## 2017-08-28 NOTE — ED Notes (Signed)
Pt called out stating she "wasn't breathing right" RN asked pt to describe and she stated her breaths feel shallow. Dr. Alcario Drought aware, CT Angio ordered

## 2017-08-28 NOTE — H&P (Addendum)
History and Physical    Sharon Warner RCV:[id number] DOB: [birth year] DOA: 08/28/2017  PCP: Garwin Brothers, MD  Patient coming from: Home  I have personally briefly reviewed patient's old medical records in [facility]  Chief Complaint: CP  HPI: Sharon Warner is a 66 y.o. female with medical history significant of paraplegia, neurogenic bladder, wheelchair-bound, chronic indwelling Foley catheter, sacral decubitus ulcer, morbid obesity, HTN, type II DM, sacral osteomyelitis, chronic CHF.  Patient presents to the ED with c/o SOB, fevers.  Chest congestion with mild productive cough.  Today was at home, husband was helping her roll over, developed acute chest pain anterior L chest, worse with movement.  CP stopped after repositioning, but now has SOB, generalized weakness.  She notes cloudy urine recently and suspects she may have a UTI.  Also has some LUE redness that is new.  She has a chronic stage 4 sacral decubitus, seeing wound care, but doesn't appear to be grossly infected today.  Admission in July this year for sepsis / bacteremia with sacroiliitis / UTI.  BCx that admission grew out proteus and group B strep. (Both sensitive to rocephin)  Completed long duration course of Keflex and flagyl, off ABx since end of Aug  ED Course: Tachycardic to 110s, UA suggestive of UTI.  WBC 11.7k, [place] of LUE neg for DVT.  Trop neg x1.  CXR neg.   Review of Systems: As per HPI otherwise 10 point review of systems negative.   Past Medical History:  Diagnosis Date  . Acute on chronic systolic CHF (congestive heart failure) ([place])   . Chronic heart failure ([place])   . Chronic indwelling Foley catheter    last changed few days ago  . Diabetes mellitus without complication ([place])   . Enlarged thoracic aorta ([place]) 03/14/2016  . H/O paraplegia    ascending paralysis unclear etiology  . Hypertension   . Lumbar spondylosis   . Morbid obesity ([place])   . OSA on CPAP   . Sacral decubitus ulcer, stage IV  [facility])     Past Surgical History:  Procedure Laterality Date  . JOINT REPLACEMENT Right    Hip  . TONSILLECTOMY    . VAGINAL HYSTERECTOMY       reports that  has never smoked. she has never used smokeless tobacco. She reports that she does not drink alcohol or use drugs.  Allergies  Allergen Reactions  . Influenza Vaccines Other (See Comments)    Patient lost feeling in her legs and that spread  . Levaquin [Levofloxacin In D5w] Nausea Only  . Penicillins Other (See Comments)    From childhood: Has patient had a PCN reaction causing immediate rash, facial/tongue/throat swelling, SOB or lightheadedness with hypotension: Unknown Has patient had a PCN reaction causing severe rash involving mucus membranes or skin necrosis: Unknown Has patient had a PCN reaction that required hospitalization: No Has patient had a PCN reaction occurring within the last 10 years: No If all of the above answers are "NO", then may proceed with Cephalosporin use.  . Simvastatin Other (See Comments)    Reaction not recalled; PATIENT STATED THAT SHE "WILL NOT TAKE"  . Statins Other (See Comments)    PATIENT STATED THAT SHE "WILL NOT TAKE" THESE    Family History  Problem Relation Age of Onset  . Hypothyroidism Mother   . Diabetes Maternal Grandmother   . Heart disease Maternal Grandfather     Prior to Admission medications   Medication Sig Start Date End Date  Taking? Authorizing Provider  Ascorbic Acid (VITAMIN C PO) Take 1 tablet by mouth daily.   Yes [provider]  aspirin EC 81 MG tablet Take 81 mg by mouth daily.   Yes [provider]  Biotin 10000 MCG TABS Take 10,000 mcg by mouth daily.   Yes [provider]  Cholecalciferol (VITAMIN D-3) 1000 units CAPS Take 1,000 Units by mouth daily.   Yes [provider]  ciclopirox (PENLAC) 8 % solution Apply 1 application topically See admin instructions. TO BOTH FEET AT BEDTIME: Apply over nail and surrounding skin.  Apply daily over previous coat. After seven (7) days, may remove with alcohol and continue cycle.   Yes [provider]  CRANBERRY PO Take 1 capsule by mouth daily.   Yes [provider]  ferrous sulfate 325 (65 FE) MG tablet Take 325 mg by mouth daily. 07/23/17  Yes [provider]  fluticasone (FLONASE) 50 MCG/ACT nasal spray Place 1 spray into both nostrils daily. 06/22/17  Yes [provider]  H-CHLOR 12 0.125 % SOLN APPLY TO SACRUM 3 TIMES A WEEK: CLEAN AREA, PAT DRY, PACK WITH SOAKED GAUZE, [place] 06/23/17  Yes [provider]  Homeopathic Products (ZICAM COLD REMEDY) TBDP Take 1 tablet by mouth daily as needed (for cold symptoms).   Yes [provider]  HYDROcodone-acetaminophen (NORCO) 7.5-325 MG tablet Take 1 tablet by mouth daily as needed (for severe pain).  08/18/17  Yes [provider]  Melatonin 3 MG TABS Take 3 mg by mouth at bedtime as needed (for sleep).   Yes [provider]  Menthol, Topical Analgesic, (BIOFREEZE) 4 % GEL Apply 1 application topically See admin instructions. APPLY 1-3 TIMES A DAY TO PAINFUL SITES   Yes [provider]  metoprolol succinate (TOPROL-XL) 25 MG 24 hr tablet Take 12.5 mg by mouth daily.   Yes [provider]  midodrine (PROAMATINE) 2.5 MG tablet Take 1 tablet (2.5 mg total) by mouth 3 (three) times daily with meals. 04/16/17  Yes Sheikh, Omair Latif, DO  Multiple Vitamins-Minerals (CENTRUM SILVER 50+WOMEN) TABS Take 1 tablet by mouth daily.   Yes [provider]  naproxen (NAPROSYN) 500 MG tablet Take 500 mg by mouth daily as needed for pain. 06/22/17  Yes [provider]  Olopatadine HCl 0.2 % SOLN Place 1 drop into both eyes 2 (two) times daily as needed (for itching).  06/22/17  Yes [provider]  promethazine (PHENERGAN) 25 MG tablet Take 25 mg by mouth every 6 (six) hours as needed for nausea/vomiting. 06/22/17  Yes [provider]   senna-docusate (SENOKOT-S) 8.6-50 MG tablet Take 1 tablet by mouth at bedtime. Patient taking differently: Take 1 tablet by mouth at bedtime as needed for mild constipation.  04/14/17  Yes Florencia Reasons, MD  traMADol-acetaminophen (ULTRACET) 37.5-325 MG tablet Take 1 tablet by mouth every 4 (four) hours as needed (for pain).   Yes [provider]  zinc sulfate 220 (50 Zn) MG capsule Take 220 mg by mouth daily.   Yes [provider]  Lactobacillus TABS Take 1 tablet by mouth 2 (two) times daily.     [provider]    Physical Exam: Vitals:   08/28/17 1800 08/28/17 1815 08/28/17 1900 08/28/17 1945  BP: 125/71 112/78 117/84 112/86  Pulse: (!) 112 (!) 108 (!) 109 (!) 112  Resp:  (!) 49 (!) 30 (!) 31  Temp:      TempSrc:      SpO2:  94% 92% 90% 93%  Weight:        Constitutional: NAD, calm, comfortable Eyes: PERRL, lids and conjunctivae normal ENMT: Mucous membranes are moist. Posterior pharynx clear of any exudate or lesions.Normal dentition.  Neck: normal, supple, no masses, no thyromegaly Respiratory: B crackles Cardiovascular: Tachycardic, regular Abdomen: no tenderness, no masses palpated. No hepatosplenomegaly. Bowel sounds positive.  Musculoskeletal: no clubbing / cyanosis. No joint deformity upper and lower extremities. Good ROM, no contractures. Normal muscle tone.  Skin: Stage 4 sacral decubitus, BLE edema present.  RUE swelling Neurologic: CN 2-12 grossly intact. Sensation intact, DTR normal. Strength 5/5 in all 4.  Psychiatric: Normal judgment and insight. Alert and oriented x 3. Normal mood.    Labs on Admission: I have personally reviewed following labs and imaging studies  CBC: Recent Labs  Lab 08/28/17 1654  WBC 11.7*  HGB 10.0*  HCT 33.6*  MCV 71.8*  PLT 244   Basic Metabolic Panel: Recent Labs  Lab 08/28/17 1654  NA 133*  K 3.4*  CL 102  CO2 21*  GLUCOSE 84  BUN 24*  CREATININE 0.74  CALCIUM 11.4*   GFR: Estimated  Creatinine Clearance: 61.7 mL/min (by C-G formula based on SCr of 0.74 mg/dL). Liver Function Tests: No results for input(s): AST, ALT, ALKPHOS, BILITOT, PROT, ALBUMIN in the last 168 hours. No results for input(s): LIPASE, AMYLASE in the last 168 hours. No results for input(s): AMMONIA in the last 168 hours. Coagulation Profile: No results for input(s): INR, PROTIME in the last 168 hours. Cardiac Enzymes: No results for input(s): CKTOTAL, CKMB, CKMBINDEX, TROPONINI in the last 168 hours. BNP (last 3 results) No results for input(s): PROBNP in the last 8760 hours. HbA1C: No results for input(s): HGBA1C in the last 72 hours. CBG: No results for input(s): GLUCAP in the last 168 hours. Lipid Profile: No results for input(s): CHOL, HDL, LDLCALC, TRIG, CHOLHDL, LDLDIRECT in the last 72 hours. Thyroid Function Tests: No results for input(s): TSH, T4TOTAL, FREET4, T3FREE, THYROIDAB in the last 72 hours. Anemia Panel: No results for input(s): VITAMINB12, FOLATE, FERRITIN, TIBC, IRON, RETICCTPCT in the last 72 hours. Urine analysis:    Component Value Date/Time   COLORURINE YELLOW 08/28/2017 1807   APPEARANCEUR TURBID (A) 08/28/2017 1807   LABSPEC 1.011 08/28/2017 1807   PHURINE 6.0 08/28/2017 1807   GLUCOSEU NEGATIVE 08/28/2017 1807   HGBUR MODERATE (A) 08/28/2017 1807   BILIRUBINUR NEGATIVE 08/28/2017 1807   KETONESUR 5 (A) 08/28/2017 1807   PROTEINUR 100 (A) 08/28/2017 1807   NITRITE POSITIVE (A) 08/28/2017 1807   LEUKOCYTESUR LARGE (A) 08/28/2017 1807    Radiological Exams on Admission: Dg Chest 2 View  Result Date: 08/28/2017 CLINICAL DATA:  67 year old female with 7 onset chest pain. Patient is paraplegic. EXAM: CHEST  2 VIEW COMPARISON:  04/22/2017 and multiple priors. FINDINGS: Cardiomediastinal silhouette is stable in size and morphology. The bilateral lungs are clear. No sizable pleural effusions or pneumothorax. There is stable elevation of the right hemidiaphragm. No acute  osseous abnormality. IMPRESSION: Stable appearance of the chest without acute intrathoracic process. Electronically Signed   By: Kristopher Oppenheim M.D.   On: 08/28/2017 17:42    EKG: Independently reviewed.  Assessment/Plan Principal Problem:   Catheter-associated urinary tract infection (HCC) Active Problems:   Paraplegia (HCC)   Neurogenic bladder   Hypercalcemia   Sacral decubitus ulcer, stage IV (HCC)    1. CAUTI - 1. Replace foley 2. UCx and BCx pending 3. Rocephin 4. Tele monitor for  tachycardia 5. Gentle hydration with NS at 75, watch for fluid overload 2. Tachycardia - 1. Acute on chronic 2. Tele monitor 3. Continue low dose metoprolol 4. Gentle hydration with NS at 75, watch for fluid overload 5. Of note, patient is no longer on midodrine she says. 6. Checking serial trops for chest pain 3. RUE swelling - 1. [place] negative for PE 2. Will get D.Dimer.  Of note however, she has become tachycardic with infection 2x previously and CTA was negative both times. 4. Paraplegia and neurogenic bladder - 1. Replace foley 5. Sacral decubitus -  1. Wound care consult 2. No new drainage or obvious infectious signs from decubitus at this point. 3. If patient does develop worsening sepsis during stay however, would add Vanc as next step. 6. Hypercalcemia - 1. IVF 2. Repeat BMP in AM 7. Hypokalemia - replace  DVT prophylaxis: Lovenox Code Status: Full Family Communication: No family in room Disposition Plan: TBD Consults called: None Admission status: Admit to inpatient - CAUTI in a paraplegic patient with tachycardia and systemic effects.   Etta Quill DO [facility] Pager [phone]  If 7AM-7PM, please contact day team taking care of patient [email or web address] Password [facility]  08/28/2017, 8:44 PM

## 2017-08-28 NOTE — Progress Notes (Addendum)
D. Dimer positive.  Adding on CTA to rule out PE in this patient with tachycardia, SOB, positive D.Dimer, and paraplegic at baseline.  Also now apparently desating on room air to upper 80s and with new O2 requirement despite neg CXR.  Will also hold IVF pending CT results.

## 2017-08-28 NOTE — ED Notes (Signed)
ED Provider at bedside. 

## 2017-08-28 NOTE — ED Triage Notes (Signed)
Pt from home with ems c.o sudden onset CP. Pt is parapalegic since 2015, husband at home was turning the pt when she started having the CP, once pt was moved back into supine position her CP went away. Rhonchi noted in all lung fields, pt reports chills at home with chest congestion. Redness and edema noted to left arm pt reports is new. Pt a.o, VSS, nad

## 2017-08-28 NOTE — ED Notes (Signed)
Main lab to add on d dimer 

## 2017-08-28 NOTE — ED Notes (Signed)
Pt oxygen saturation dropped to 85%, applied 3L [place] and o2 increased to 95%

## 2017-08-28 NOTE — ED Provider Notes (Addendum)
[facility] EMERGENCY DEPARTMENT Provider Note   CSN: [id number] Arrival date & time: 08/28/17  1641     History   Chief Complaint Chief Complaint  Patient presents with  . Chest Pain    HPI Sharon Warner is a 66 y.o. female.  HPI Patient presents with chest pain shortness of breath fevers.  States she is having chest congestion with mild production.  Today she was rolled at home and began to have acute chest pain on her anterior left chest.  Worse with movement.  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  Has a chronic Foley catheter and think she has a urinary tract infection because it is been more cloudy.  States she feels weak all over.  Also has had some redness to the left upper arm.  The redness is new for her. Past Medical History:  Diagnosis Date  . Acute on chronic systolic CHF (congestive heart failure) ([place])   . Chronic heart failure ([place])   . Chronic indwelling Foley catheter    last changed few days ago  . Diabetes mellitus without complication ([place])   . Enlarged thoracic aorta ([place]) 03/14/2016  . H/O paraplegia    ascending paralysis unclear etiology  . Hypertension   . Lumbar spondylosis   . Morbid obesity ([place])   . OSA on CPAP   . Sacral decubitus ulcer, stage IV [facility])     Patient Active Problem List   Diagnosis Date Noted  . Sacroiliitis ([place]) 04/12/2017  . Streptococcal bacteremia 04/12/2017  . Gram-negative bacteremia 04/12/2017  . Acute on chronic combined systolic and diastolic CHF (congestive heart failure) ([place])   . Sacral decubitus ulcer, stage IV ([place]) 08/15/2016  . Hypercalcemia 04/18/2016  . Protein-calorie malnutrition ([place]) 04/16/2016  . Multinodular goiter 04/16/2016  . Morbid obesity ([place]) 03/14/2016  . Type 2 diabetes mellitus ([place]) 03/14/2016  . HTN (hypertension) 03/14/2016  . Paraplegia ([place]) 03/14/2016  . Neurogenic bladder 03/14/2016  . Obstructive sleep apnea 03/14/2016  . Microcytic anemia  03/14/2016    Past Surgical History:  Procedure Laterality Date  . JOINT REPLACEMENT Right    Hip  . TONSILLECTOMY    . VAGINAL HYSTERECTOMY      OB History    No data available       Home Medications    Prior to Admission medications   Medication Sig Start Date End Date Taking? Authorizing Provider  Ascorbic Acid (VITAMIN C PO) Take 1 tablet by mouth daily.   Yes [provider]  aspirin EC 81 MG tablet Take 81 mg by mouth daily.   Yes [provider]  Biotin 10000 MCG TABS Take 10,000 mcg by mouth daily.   Yes [provider]  Cholecalciferol (VITAMIN D-3) 1000 units CAPS Take 1,000 Units by mouth daily.   Yes [provider]  ciclopirox (PENLAC) 8 % solution Apply 1 application topically See admin instructions. TO BOTH FEET AT BEDTIME: Apply over nail and surrounding skin. Apply daily over previous coat. After seven (7) days, may remove with alcohol and continue cycle.   Yes [provider]  CRANBERRY PO Take 1 capsule by mouth daily.   Yes [provider]  ferrous sulfate 325 (65 FE) MG tablet Take 325 mg by mouth daily. 07/23/17  Yes [provider]  fluticasone (FLONASE) 50 MCG/ACT nasal spray Place 1 spray into both nostrils daily. 06/22/17  Yes [provider]  H-CHLOR 12 0.125 % SOLN APPLY TO SACRUM 3  TIMES A WEEK: CLEAN AREA, PAT DRY, PACK WITH SOAKED GAUZE, COVER 06/23/17  Yes [provider]  Homeopathic Products [facility] COLD REMEDY) TBDP Take 1 tablet by mouth daily as needed (for cold symptoms).   Yes [provider]  HYDROcodone-acetaminophen (NORCO) 7.5-325 MG tablet Take 1 tablet by mouth daily as needed (for severe pain).  08/18/17  Yes [provider]  Melatonin 3 MG TABS Take 3 mg by mouth at bedtime as needed (for sleep).   Yes [provider]  Menthol, Topical Analgesic, (BIOFREEZE) 4 % GEL Apply 1 application topically See admin instructions. APPLY 1-3 TIMES  A DAY TO PAINFUL SITES   Yes [provider]  metoprolol succinate (TOPROL-XL) 25 MG 24 hr tablet Take 12.5 mg by mouth daily.   Yes [provider]  midodrine (PROAMATINE) 2.5 MG tablet Take 1 tablet (2.5 mg total) by mouth 3 (three) times daily with meals. 04/16/17  Yes Sheikh, Omair Latif, DO  Multiple Vitamins-Minerals (CENTRUM SILVER 50+WOMEN) TABS Take 1 tablet by mouth daily.   Yes [provider]  naproxen (NAPROSYN) 500 MG tablet Take 500 mg by mouth daily as needed for pain. 06/22/17  Yes [provider]  Olopatadine HCl 0.2 % SOLN Place 1 drop into both eyes 2 (two) times daily as needed (for itching).  06/22/17  Yes [provider]  promethazine (PHENERGAN) 25 MG tablet Take 25 mg by mouth every 6 (six) hours as needed for nausea/vomiting. 06/22/17  Yes [provider]  senna-docusate (SENOKOT-S) 8.6-50 MG tablet Take 1 tablet by mouth at bedtime. Patient taking differently: Take 1 tablet by mouth at bedtime as needed for mild constipation.  04/14/17  Yes Florencia Reasons, MD  traMADol-acetaminophen (ULTRACET) 37.5-325 MG tablet Take 1 tablet by mouth every 4 (four) hours as needed (for pain).   Yes [provider]  zinc sulfate 220 (50 Zn) MG capsule Take 220 mg by mouth daily.   Yes [provider]  famotidine (PEPCID) 20 MG tablet Take 1 tablet (20 mg total) by mouth at bedtime. Patient not taking: Reported on 08/28/2017 04/14/17   Florencia Reasons, MD  Lactobacillus TABS Take 1 tablet by mouth 2 (two) times daily.     [provider]  potassium chloride (KLOR-CON 10) 10 MEQ tablet Take 10 mEq by mouth daily.    [provider]    Family History Family History  Problem Relation Age of Onset  . Hypothyroidism Mother   . Diabetes Maternal Grandmother   . Heart disease Maternal Grandfather     Social History Social History   Tobacco Use  . Smoking status: Never Smoker  . Smokeless tobacco: Never Used    Substance Use Topics  . Alcohol use: No  . Drug use: No     Allergies   Influenza vaccines; Levaquin [levofloxacin in d5w]; Penicillins; Simvastatin; and Statins   Review of Systems Review of Systems  Constitutional: Positive for appetite change, chills and fever.  Respiratory: Positive for cough and shortness of breath.   Cardiovascular: Positive for chest pain.  Gastrointestinal: Negative for abdominal pain.  Genitourinary: Negative for difficulty urinating.  Musculoskeletal: Negative for back pain.  Skin:       Patient has chronic decubitus ulcer  Neurological: Negative for weakness and numbness.  Hematological: Negative for adenopathy.  Psychiatric/Behavioral: Negative for confusion.     Physical Exam Updated Vital Signs BP 112/78   Pulse (!) 108   Temp 98.5 F (36.9 C) (Oral)  Resp (!) 49   Wt 65.8 kg (145 lb)   SpO2 92%   BMI 27.40 kg/m   Physical Exam  Constitutional: She appears well-developed.  HENT:  Head: Normocephalic.  Eyes: Pupils are equal, round, and reactive to light.  Neck: Neck supple.  Cardiovascular:  Mild tachycardia  Pulmonary/Chest:  Somewhat harsh breath sounds diffusely.  No localizing rales or rhonchi  Musculoskeletal:       Right lower leg: She exhibits edema.       Left lower leg: She exhibits edema.  Left upper extremity was some edema greater than the right side.  Does have some pitting edema bilateral lower extremities also but family states it is much smaller than normal.  Neurological: She is alert.  Skin: Skin is warm. Capillary refill takes less than 2 seconds.  Patient has approximately 6 cm deep decubitus ulcer.  Underlying tissue is red.  No foul smell or purulent drainage.   ED Treatments / Results  Labs (all labs ordered are listed, but only abnormal results are displayed) Labs Reviewed  BASIC METABOLIC PANEL - Abnormal; Notable for the following components:      Result Value   Sodium 133 (*)    Potassium 3.4  (*)    CO2 21 (*)    BUN 24 (*)    Calcium 11.4 (*)    All other components within normal limits  CBC - Abnormal; Notable for the following components:   WBC 11.7 (*)    Hemoglobin 10.0 (*)    HCT 33.6 (*)    MCV 71.8 (*)    MCH 21.4 (*)    MCHC 29.8 (*)    RDW 18.4 (*)    All other components within normal limits  URINALYSIS, ROUTINE W REFLEX MICROSCOPIC - Abnormal; Notable for the following components:   APPearance TURBID (*)    Hgb urine dipstick MODERATE (*)    Ketones, ur 5 (*)    Protein, ur 100 (*)    Nitrite POSITIVE (*)    Leukocytes, UA LARGE (*)    Bacteria, UA MANY (*)    All other components within normal limits  URINE CULTURE  I-STAT TROPONIN, ED  I-STAT CG4 LACTIC ACID, ED    EKG  EKG Interpretation None       Radiology Dg Chest 2 View  Result Date: 08/28/2017 CLINICAL DATA:  66 year old female with 7 onset chest pain. Patient is paraplegic. EXAM: CHEST  2 VIEW COMPARISON:  04/22/2017 and multiple priors. FINDINGS: Cardiomediastinal silhouette is stable in size and morphology. The bilateral lungs are clear. No sizable pleural effusions or pneumothorax. There is stable elevation of the right hemidiaphragm. No acute osseous abnormality. IMPRESSION: Stable appearance of the chest without acute intrathoracic process. Electronically Signed   By: Kristopher Oppenheim M.D.   On: 08/28/2017 17:42    Procedures Procedures (including critical care time)  Medications Ordered in ED Medications - No data to display   Initial Impression / Assessment and Plan / ED Course  I have reviewed the triage vital signs and the nursing notes.  Pertinent labs & imaging results that were available during my care of the patient were reviewed by me and considered in my medical decision making (see chart for details).     Patient with shortness of breath cough.  May have URI.  Also has had fevers.  Left upper extremity has some mild swelling.  Doppler done and is negative.  She is  somewhat tachycardic but reviewing her records she  appears to get tachycardia with infection that looks like she has a likely urinary tract infection.  Chronic indwelling catheter.  Also mild hypercalcemia at 11.4.  Overall with likely urinary tract infection and fevers with tachycardia I think patient would benefit from admission.  Has had some resistant organisms in the past.  Will admit to hospitalist.  Final Clinical Impressions(s) / ED Diagnoses   Final diagnoses:  Urinary tract infection associated with indwelling urethral catheter, initial encounter [facility])  Hypercalcemia  Upper respiratory tract infection, unspecified type    ED Discharge Orders    None       Davonna Belling, MD 08/28/17 Karl Bales    Davonna Belling, MD 08/28/17 2009

## 2017-08-28 NOTE — ED Notes (Signed)
Vascular at bedside

## 2017-08-29 ENCOUNTER — Inpatient Hospital Stay (HOSPITAL_COMMUNITY): Payer: BLUE CROSS/BLUE SHIELD

## 2017-08-29 ENCOUNTER — Other Ambulatory Visit: Payer: Self-pay

## 2017-08-29 DIAGNOSIS — J069 Acute upper respiratory infection, unspecified: Secondary | ICD-10-CM

## 2017-08-29 DIAGNOSIS — A419 Sepsis, unspecified organism: Secondary | ICD-10-CM

## 2017-08-29 LAB — BASIC METABOLIC PANEL
ANION GAP: 10 (ref 5–15)
BUN: 23 mg/dL — ABNORMAL HIGH (ref 6–20)
CHLORIDE: 102 mmol/L (ref 101–111)
CO2: 19 mmol/L — AB (ref 22–32)
Calcium: 10.8 mg/dL — ABNORMAL HIGH (ref 8.9–10.3)
Creatinine, Ser: 0.67 mg/dL (ref 0.44–1.00)
GFR calc Af Amer: >60 mL/min (ref >60)
GFR calc non Af Amer: >60 mL/min (ref >60)
GLUCOSE: 141 mg/dL — AB (ref 65–99)
POTASSIUM: 3.5 mmol/L (ref 3.5–5.1)
Sodium: 131 mmol/L — ABNORMAL LOW (ref 135–145)

## 2017-08-29 LAB — CBC
HEMATOCRIT: 32.2 % — AB (ref 36.0–46.0)
Hemoglobin: 9.6 g/dL — ABNORMAL LOW (ref 12.0–15.0)
MCH: 21.3 pg — ABNORMAL LOW (ref 26.0–34.0)
MCHC: 29.8 g/dL — ABNORMAL LOW (ref 30.0–36.0)
MCV: 71.6 fL — AB (ref 78.0–100.0)
Platelets: 402 10*3/uL — ABNORMAL HIGH (ref 150–400)
RBC: 4.5 MIL/uL (ref 3.87–5.11)
RDW: 18.3 % — ABNORMAL HIGH (ref 11.5–15.5)
WBC: 15.8 10*3/uL — AB (ref 4.0–10.5)

## 2017-08-29 LAB — LACTIC ACID, PLASMA
LACTIC ACID, VENOUS: 1.1 mmol/L (ref 0.5–1.9)
Lactic Acid, Venous: 1 mmol/L (ref 0.5–1.9)

## 2017-08-29 LAB — GLUCOSE, CAPILLARY
Glucose-Capillary: 115 mg/dL — ABNORMAL HIGH (ref 65–99)
Glucose-Capillary: 130 mg/dL — ABNORMAL HIGH (ref 65–99)

## 2017-08-29 LAB — URINE CULTURE

## 2017-08-29 LAB — TROPONIN I
Troponin I: <0.03 ng/mL (ref <0.03)
Troponin I: <0.03 ng/mL (ref <0.03)

## 2017-08-29 LAB — PROCALCITONIN: Procalcitonin: 0.33 ng/mL

## 2017-08-29 MED ORDER — SODIUM CHLORIDE 0.9 % IV BOLUS (SEPSIS)
1000.0000 mL | Freq: Once | INTRAVENOUS | Status: AC
  Administered 2017-08-29: 1000 mL via INTRAVENOUS

## 2017-08-29 MED ORDER — GUAIFENESIN ER 600 MG PO TB12
600.0000 mg | ORAL_TABLET | Freq: Two times a day (BID) | ORAL | Status: DC | PRN
  Administered 2017-08-30: 600 mg via ORAL
  Filled 2017-08-29 (×4): qty 1

## 2017-08-29 MED ORDER — VANCOMYCIN HCL 500 MG IV SOLR
500.0000 mg | Freq: Two times a day (BID) | INTRAVENOUS | Status: DC
  Administered 2017-08-30 – 2017-09-01 (×5): 500 mg via INTRAVENOUS
  Filled 2017-08-29 (×8): qty 500

## 2017-08-29 MED ORDER — INSULIN ASPART 100 UNIT/ML ~~LOC~~ SOLN
0.0000 [IU] | Freq: Three times a day (TID) | SUBCUTANEOUS | Status: DC
  Administered 2017-08-31: 1 [IU] via SUBCUTANEOUS

## 2017-08-29 MED ORDER — PRO-STAT SUGAR FREE PO LIQD
30.0000 mL | Freq: Two times a day (BID) | ORAL | Status: DC
  Administered 2017-08-29 – 2017-09-03 (×8): 30 mL via ORAL
  Filled 2017-08-29 (×13): qty 30

## 2017-08-29 MED ORDER — VANCOMYCIN HCL IN DEXTROSE 1-5 GM/200ML-% IV SOLN
1000.0000 mg | Freq: Once | INTRAVENOUS | Status: AC
  Administered 2017-08-29: 1000 mg via INTRAVENOUS
  Filled 2017-08-29 (×2): qty 200

## 2017-08-29 MED ORDER — WHITE PETROLATUM EX OINT
TOPICAL_OINTMENT | CUTANEOUS | Status: DC | PRN
  Filled 2017-08-29: qty 28.35

## 2017-08-29 MED ORDER — DEXTROSE 5 % IV SOLN
500.0000 mg | INTRAVENOUS | Status: DC
  Administered 2017-08-29: 500 mg via INTRAVENOUS
  Filled 2017-08-29: qty 500

## 2017-08-29 MED ORDER — ENSURE ENLIVE PO LIQD: 237.0000 mL | Freq: Two times a day (BID) | ORAL | Status: DC

## 2017-08-29 MED ORDER — PREMIER PROTEIN SHAKE
2.0000 [oz_av] | Freq: Two times a day (BID) | ORAL | Status: DC
  Administered 2017-08-29 – 2017-09-01 (×7): 2 [oz_av] via ORAL
  Filled 2017-08-29 (×16): qty 325.31

## 2017-08-29 MED ORDER — DEXTROSE 5 % IV SOLN
2.0000 g | Freq: Once | INTRAVENOUS | Status: AC
  Administered 2017-08-29: 2 g via INTRAVENOUS
  Filled 2017-08-29: qty 2

## 2017-08-29 MED ORDER — DEXTROSE 5 % IV SOLN
1.0000 g | Freq: Three times a day (TID) | INTRAVENOUS | Status: DC
  Administered 2017-08-29 – 2017-09-03 (×15): 1 g via INTRAVENOUS
  Filled 2017-08-29 (×18): qty 1

## 2017-08-29 MED ORDER — SODIUM CHLORIDE 0.9 % IV SOLN
INTRAVENOUS | Status: DC
  Administered 2017-08-29: 19:00:00 via INTRAVENOUS

## 2017-08-29 NOTE — ED Notes (Signed)
Patient returned from CT

## 2017-08-29 NOTE — Progress Notes (Signed)
Follow up for rapid response team encounter at 1400.   Pt continues to have soft BP despite receiving 2L NS bolus.  Neuro remains intact, UO is adequate with 1200 ml documented output in chart.  Skin warm and dry. Lungs clear bilaterally.  NSR on monitor with rate of 91.  BP 86/58.  Pt sitting upright in bed, does not appear symptomatic at this time.  Dr. Horris Latino at bedside, plan to continue to monitor on SDU for now.  Rapid Response team following.

## 2017-08-29 NOTE — Progress Notes (Signed)
CTA is neg for PE.  Is suspicious for pulm HTN, and PNA though.  Will add azithromycin for CAP coverage.

## 2017-08-29 NOTE — Significant Event (Signed)
Rapid Response Event Note  Overview:  Called to assist with patient with hypotension Time Called: 1347 Arrival Time: 1350 Event Type: Hypotension  Initial Focused Assessment:  Awake and alert - hot to touch - pale - oriented and follows commands.  Denies pain or SOB.  RR 28 -32 shallow - regular and unlabored - bil BS present - clear.  Abd soft - colostomy patent.  Hx paraplegia.  Rectal temp 102.5 BP 85/52.  ST 122.   Interventions: Dr. Horris Latino to bedside.  NS bolus started per her order - 500 cc/hr for one liter.  MD to change abx.  Placed in SDU mode - with VS every 15 mins for one hour - if no response to fluid or mental changes RN to call MD and RRT for assistance.  Patient remains alert - co-op.    Plan of Care (if not transferred):  See note above.  Transitioned to SDU status  Event Summary: Name of Physician Notified: Dr. Horris Latino at (PTA RRT)    at    Outcome: (upgraded to SDU status)     Quin Hoop

## 2017-08-29 NOTE — Progress Notes (Signed)
Patient had a pause of 2.07 sec's. . Notified MD.  Will continue to monitor the patient

## 2017-08-29 NOTE — Consult Note (Signed)
[place] Nurse wound consult note Reason for Consult: Consult requested for sacrum wound.  Pt is familiar to [facility] team from a previous visit in July. 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  Air mattress to reduce pressure.  Pt is wearing foam boots to heels and there are no pressure injuries when assessed bilat feet.  Nutrition consult to optimize protein intake. Discussed plan of care with patient and she verbalized understanding. Please re-consult if further assistance is needed.  Thank-you,  Julien Girt MSN, [place], [place], [place], [place]

## 2017-08-29 NOTE — Progress Notes (Signed)
PROGRESS NOTE  Sharon Warner DTO:[id number] DOB: Sep 26, 1951 DOA: 08/28/2017 PCP: Garwin Brothers, MD  HPI/Recap of past 24 hours: HPI from Dr Jennette Kettle on 08/28/17 Sharon Warner is a 66 y.o. female with medical history significant of paraplegia, neurogenic bladder, wheelchair-bound, chronic indwelling Foley catheter, sacral decubitus ulcer, morbid obesity, HTN, type II DM, sacral osteomyelitis, CHF presents to the ED, with c/o SOB, fevers, generalized weakness, chest congestion with mild productive cough for a couple of days, not improving. PTA, pt developed L sided chest pain, while husband was moving patient. CP lasted for a few mins and stopped after repositioning. CP does not radiate, and nothing made it better. Pt also noted cloudy urine recently and suspects she may have a UTI. Pt has a chronic stage 4 sacral decubitus, seeing wound care, but doesn't appear to be grossly infected. Of note, pt was admitted in 03/2017 for sepsis/bacteremia with sacroiliitis/UTI.  BCx that admission grew out proteus and group B strep of which she completed long duration course of Keflex and flagyl, off ABx since end of Aug. In the ED, pt was noted to be tachycardic, with mild leukocytosis. Admitted for sepsis likely due to UTI  Today, pt noted to be febrile, tachycardic HR 120s and BP 85/52, with pt lethargic, but was responsive and cooperating. Rapid response team was called. Pt was given IV bolus 2L, AB changed to IV Vanc and Zosyn   Assessment/Plan: Principal Problem:   Catheter-associated urinary tract infection (HCC) Active Problems:   Paraplegia (HCC)   Neurogenic bladder   Hypercalcemia   Sacral decubitus ulcer, stage IV (HCC)  #Sepsis Febrile, leukocytosis, tachycardic, hypotension Likely due to CAUTI Vs CAP Vs sacral wound Patient is alert, awake, oriented, cooperative, no respiratory distress Lactic acid within normal limits, pro calcitonin 0.33 Chest x-ray negative CT angio negative for PE  but shows right upper lobe density could be a pneumonia and right lower lobe mucous plug UA showed large leukocytes, WBC TNTC, nitrite positive Blood culture x2, urine culture pending Status post 2 L of IV bolus, continue gentle hydration at 75 cc/h Discontinue IV Rocephin and ceftriaxone Start IV vancomycin and cefepime Change Foley catheter Switch patient to stepdown unit for closer monitoring  #Sacral decubitus ulcer stage IV No new drainage or obvious signs of infection from ulcer Due to sepsis we will cover with Vancomycin Wound care consult placed  #History of sacral osteomyelitis/bacteremia Diagnosed in 03/2017, currently denies any back pain Blood culture during that admission grew Proteus and group B strep, both sensitive to Rocephin Completed treatment with Keflex and Flagyl in 04/2017 If patient does not improve, will get an MRI   #CHF Appears compensated Echo in 03/2017 showed LVEF 25-30% Monitor closely Hold home Toprol 12.5mg  due to hypotension  #Hypertension Currently hypotensive Hold home Toprol as above  #DM type II Last A1c in 03/2017, 5.0 Diet controlled SSI, Accu-Cheks, hypoglycemic protocol  #Hypokalemia Replace as needed  #Paraplegia and neurogenic bladder Replace Foley    Code Status: Full  Family Communication: None at bedside  Disposition Plan: Home once stable   Consultants:  None  Procedures:  None  Antimicrobials:  IV vancomycin  IV cefepime  DVT prophylaxis: Lovenox   Objective: Vitals:   08/29/17 1500 08/29/17 1518 08/29/17 1528 08/29/17 1600  BP: (!) 74/50 (!) 82/55 (!) 77/51 (!) 72/50  Pulse: 99   90  Resp: (!) 34 (!) 24 (!) 28 (!) 25  Temp:  99 F (37.2 C)    TempSrc:  Oral    SpO2: 97% 98% 97% 96%  Weight:      Height:        Intake/Output Summary (Last 24 hours) at 08/29/2017 1604 Last data filed at 08/29/2017 1000 Gross per 24 hour  Intake 420 ml  Output 400 ml  Net 20 ml   Filed Weights    08/28/17 1652 08/29/17 0427  Weight: 65.8 kg (145 lb) 61.1 kg (134 lb 12.8 oz)    Exam:   General: Alert, awake, oriented x3, no distress  Cardiovascular: S1-S2 present, no added heart sounds  Respiratory: Mild bibasilar crackles noted  Abdomen: Soft, nontender, nondistended, bowel sounds present  Musculoskeletal: No bilateral pedal edema  Skin: Stage IV sacral decubitus ulcer  Psychiatry: Normal mood   Data Reviewed: CBC: Recent Labs  Lab 08/28/17 1654 08/29/17 0328  WBC 11.7* 15.8*  HGB 10.0* 9.6*  HCT 33.6* 32.2*  MCV 71.8* 71.6*  PLT 380 119*   Basic Metabolic Panel: Recent Labs  Lab 08/28/17 1654 08/29/17 0328  NA 133* 131*  K 3.4* 3.5  CL 102 102  CO2 21* 19*  GLUCOSE 84 141*  BUN 24* 23*  CREATININE 0.74 0.67  CALCIUM 11.4* 10.8*   GFR: Estimated Creatinine Clearance: 59.6 mL/min (by C-G formula based on SCr of 0.67 mg/dL). Liver Function Tests: No results for input(s): AST, ALT, ALKPHOS, BILITOT, PROT, ALBUMIN in the last 168 hours. No results for input(s): LIPASE, AMYLASE in the last 168 hours. No results for input(s): AMMONIA in the last 168 hours. Coagulation Profile: No results for input(s): INR, PROTIME in the last 168 hours. Cardiac Enzymes: Recent Labs  Lab 08/28/17 2225 08/29/17 0328 08/29/17 0735  TROPONINI <0.03 <0.03 <0.03   BNP (last 3 results) No results for input(s): PROBNP in the last 8760 hours. HbA1C: No results for input(s): HGBA1C in the last 72 hours. CBG: No results for input(s): GLUCAP in the last 168 hours. Lipid Profile: No results for input(s): CHOL, HDL, LDLCALC, TRIG, CHOLHDL, LDLDIRECT in the last 72 hours. Thyroid Function Tests: No results for input(s): TSH, T4TOTAL, FREET4, T3FREE, THYROIDAB in the last 72 hours. Anemia Panel: No results for input(s): VITAMINB12, FOLATE, FERRITIN, TIBC, IRON, RETICCTPCT in the last 72 hours. Urine analysis:    Component Value Date/Time   COLORURINE YELLOW 08/28/2017  1807   APPEARANCEUR TURBID (A) 08/28/2017 1807   LABSPEC 1.011 08/28/2017 1807   PHURINE 6.0 08/28/2017 1807   GLUCOSEU NEGATIVE 08/28/2017 1807   HGBUR MODERATE (A) 08/28/2017 1807   BILIRUBINUR NEGATIVE 08/28/2017 1807   KETONESUR 5 (A) 08/28/2017 1807   PROTEINUR 100 (A) 08/28/2017 1807   NITRITE POSITIVE (A) 08/28/2017 1807   LEUKOCYTESUR LARGE (A) 08/28/2017 1807   Sepsis Labs: @LABRCNTIP (procalcitonin:4,lacticidven:4)  ) Recent Results (from the past 240 hour(s))  Culture, blood (routine x 2)     Status: None (Preliminary result)   Collection Time: 08/28/17  4:54 PM  Result Value Ref Range Status   Specimen Description BLOOD BLOOD LEFT FOREARM  Final   Special Requests   Final    Blood Culture adequate volume BOTTLES DRAWN AEROBIC AND ANAEROBIC   Culture NO GROWTH < 24 HOURS  Final   Report Status PENDING  Incomplete  Urine culture     Status: Abnormal   Collection Time: 08/28/17  6:14 PM  Result Value Ref Range Status   Specimen Description URINE, CATHETERIZED  Final   Special Requests NONE  Final   Culture MULTIPLE SPECIES PRESENT, SUGGEST RECOLLECTION (A)  Final  Report Status 08/29/2017 FINAL  Final  Culture, blood (routine x 2)     Status: None (Preliminary result)   Collection Time: 08/28/17  8:37 PM  Result Value Ref Range Status   Specimen Description BLOOD RIGHT ANTECUBITAL  Final   Special Requests   Final    Blood Culture adequate volume BOTTLES DRAWN AEROBIC AND ANAEROBIC   Culture NO GROWTH < 24 HOURS  Final   Report Status PENDING  Incomplete      Studies: Dg Chest 2 View  Result Date: 08/28/2017 CLINICAL DATA:  66 year old female with 7 onset chest pain. Patient is paraplegic. EXAM: CHEST  2 VIEW COMPARISON:  04/22/2017 and multiple priors. FINDINGS: Cardiomediastinal silhouette is stable in size and morphology. The bilateral lungs are clear. No sizable pleural effusions or pneumothorax. There is stable elevation of the right hemidiaphragm. No  acute osseous abnormality. IMPRESSION: Stable appearance of the chest without acute intrathoracic process. Electronically Signed   By: Kristopher Oppenheim M.D.   On: 08/28/2017 17:42   Ct Angio Chest Pe W Or Wo Contrast  Result Date: 08/29/2017 CLINICAL DATA:  Shortness of breath with positive D-dimer EXAM: CT ANGIOGRAPHY CHEST WITH CONTRAST TECHNIQUE: Multidetector CT imaging of the chest was performed using the standard protocol during bolus administration of intravenous contrast. Multiplanar CT image reconstructions and MIPs were obtained to evaluate the vascular anatomy. CONTRAST:  53 mL Isovue 370 intravenous COMPARISON:  Radiograph 08/28/2017, CT 04/05/2017, 03/13/2016 FINDINGS: Cardiovascular: Satisfactory opacification of the pulmonary arteries to the segmental level. No evidence of pulmonary embolism. Aneurysmal dilatation of the ascending aorta, measuring up to 4.3 cm Enlarged pulmonary trunk measuring 3.9 cm. No dissection is seen. Minimal atherosclerotic calcification. Normal heart size. No pericardial effusion. Mediastinum/Nodes: Re- demonstrated multiple hypodense nodules in the thyroid gland with scattered calcification. Midline trachea. No mediastinal adenopathy. Esophagus within normal limits Lungs/Pleura: No consolidation or pleural effusion. Small nodular foci of airspace disease and ground-glass density in the posterior right upper lobe. Right lower lobe peribronchial soft tissue density with nonvisualization of right lower lobe bronchi. Probable low-density material within the right lower lobe bronchi. Partial atelectasis. Upper Abdomen: Slightly enlarged gallbladder containing multiple stones. Stones versus excreted contrast within the bilateral kidneys, measuring up to 14 mm on the left, incompletely visualized. Musculoskeletal: Stable mild superior endplate deformity at T5, T6 and T8. Review of the MIP images confirms the above findings. IMPRESSION: 1. Negative for acute pulmonary embolism or  aortic dissection. Enlarged pulmonary artery, suggesting pulmonary hypertension. 2. Ascending aortic aneurysm measuring up to 4.3 cm. Recommend annual imaging followup by CTA or MRA. This recommendation follows 2010 ACCF/AHA/AATS/ACR/ASA/SCA/SCAI/SIR/STS/SVM Guidelines for the Diagnosis and Management of Patients with Thoracic Aortic Disease. Circulation. 2010; 121: D983-J825 3. Mild nodular ground-glass densities in the right upper lobe may reflect small foci of respiratory infection 4. Probable mucous plugging within the right lower lobe bronchi 5. Gallstones with dilated gallbladder 6. Multiple thyroid nodules, consider correlation with nonemergent thyroid ultrasound Aortic Atherosclerosis (ICD10-I70.0). Electronically Signed   By: Donavan Foil M.D.   On: 08/29/2017 00:51    Scheduled Meds: . aspirin EC  81 mg Oral Daily  . cholecalciferol  1,000 Units Oral Daily  . enoxaparin (LOVENOX) injection  40 mg Subcutaneous Q24H  . feeding supplement (PRO-STAT SUGAR FREE 64)  30 mL Oral BID  . ferrous sulfate  325 mg Oral Daily  . fluticasone  1 spray Each Nare Daily  . lactobacillus acidophilus  1 tablet Oral BID  . metoprolol succinate  12.5 mg Oral Daily  . multivitamin with minerals  1 tablet Oral Daily  . protein supplement shake  2 oz Oral BID BM  . sodium hypochlorite   Topical QODAY  . zinc sulfate  220 mg Oral Daily    Continuous Infusions: . ceFEPime (MAXIPIME) IV    . [START ON 08/30/2017] vancomycin    . vancomycin       LOS: 1 day     Alma Friendly, MD [facility]   If 7PM-7AM, please contact night-coverage [email or web address] Password [email or web address] 08/29/2017, 4:04 PM

## 2017-08-29 NOTE — Progress Notes (Signed)
Spoke with Dr. Horris Latino via telephone. Provided updates on current status and assessment. Notified patient dropped pressure again with turning. Verified patient stay on SDU or transfer to ICU. Will keep on SDU for now and call with any changes.

## 2017-08-29 NOTE — ED Notes (Signed)
Pt in CT.

## 2017-08-29 NOTE — Progress Notes (Addendum)
Initial Nutrition Assessment  DOCUMENTATION CODES:   Not applicable  INTERVENTION:    Discontinue Ensure    Premier Protein BID, each supplement provides 160 kcal and 30 grams of protein.    Provide 30 mL Pro-Stat BID, each supplement provides 100 kcal and 15 grams protein   NUTRITION DIAGNOSIS:   Increased nutrient needs related to wound healing as evidenced by estimated needs.  GOAL:   Patient will meet greater than or equal to 90% of their needs  MONITOR:   PO intake, Supplement acceptance, Weight trends, I & O's  REASON FOR ASSESSMENT:   Consult Wound healing  ASSESSMENT:   Pt with PMH of HTN, DM, paraplegia, wheelchair bound, CHF, OSA on CPAP, chronic catheter presents with catheter associated-UTI    Spoke with pt at bedside. Pt endorses a decrease in appetite consuming 2 meals consisting of smaller portions and 2-3 Premier Proteins per day. Pt reports husband or aids prepare meals for her as she cannot. Per chart, pt consumed 50% of breakfast this morning.  Pt reports a decrease in weight status that she use to weight 300 lbs over a year ago and now is around 138 lbs. Per chart, pt ~200 lbs a year ago. Pt with significant recent weight loss. Pt does not meet criteria for malnutrition at this time however is at risk if continues with decreased PO intake and weight loss.   Pt with increased needs given stage IV wound and intermittent PO intake. Pt amenable to supplementation while admitted.   Labs reviewed; Na 131, CO2 19, BUN 23, Hemoglobin 9.6  Medications reviewed; vitamin D, ferrous sulfate, multivitamin, zinc sulfate, Bacid   NUTRITION - FOCUSED PHYSICAL EXAM: No fat depletions, mild-moderate muscle depletions (pt with paraplegia and is wheelchair bound - unable to use as criteria) and moderate edema.  Pt with dry/flaky skin and scalp   Diet Order:  Diet Heart Room service appropriate? Yes; Fluid consistency: Thin  EDUCATION NEEDS:   No education needs  have been identified at this time  Skin:  Skin Assessment: Skin Integrity Issues: Skin Integrity Issues:: Stage IV Stage IV: sacrum  Last BM:  08/29/17  Height:   Ht Readings from Last 1 Encounters:  08/29/17 5\' 1"  (1.549 m)    Weight:   Wt Readings from Last 1 Encounters:  08/29/17 134 lb 12.8 oz (61.1 kg)    Ideal Body Weight:  47.7 kg  BMI:  Body mass index is 25.47 kg/m.  Estimated Nutritional Needs:   Kcal:  6433-2951  Protein:  90-110 grams  Fluid:  >/= 1.6 L/d  Parks Ranger, MS, RDN, LDN 08/29/2017 12:45 PM

## 2017-08-29 NOTE — Progress Notes (Signed)
MD and rapid respond notified of patient status. Treatment administered to patient per doctor's orders. Report given to incoming nurse about patient status.

## 2017-08-29 NOTE — Progress Notes (Addendum)
Patient arrived to the unit via bed from [facility].  Patient is alert and oriented x 4.  No complaints of pain.  Vital signs: Temp: 98.3; BP 117/72; Pulse 117; resp 24 and 99% on 2LPM.  Skin assessment completed.  IV intact to the left forearm and right antecubital.  Stage 4 pressure wound to the sacral.  Bruise to the right lower extremity. Colostomy patent. Chronic Foley Catheter for neurogenic bladder. Placed the patient on telemetry and continuous pulse ox. Educated the patient on how to reach the staff on the unit. Lowered the bed; placed the call light within reach and activated the bed alarm. Will continue to monitor the patient.

## 2017-08-29 NOTE — Progress Notes (Signed)
Rounded on patient d/t RRT in emergency. Assessed patient. Patient with asymptomatic hypotension. Denies dizziness, lightheadedness, nausea. Patient with NSR and sats 98-100% on 2L via [place]. Will continue to monitor until RRT available. Patient's primary RN hanging fluid bolus at this time and notified MD.

## 2017-08-29 NOTE — Progress Notes (Addendum)
Follow Up:  Patient has been seen by me twice since 1900 just for a follow up.  I went to assess the patient to establish a baseline. Patient was alert, soft spoken, conversant, skin warm, dry, lung sounds clear, + murmur (not new), trace edema in extremities.  HR was SR in the 80, sats 100% on 2L, RR 16-18, BP: SBP upper 70s-80s, MAP 55-65.   Patient has received 2L of crystalloid on day shift and day time [facility] MD was aware of BP.  I informedd the [facility] NP oncall of the BPs and she also came to the bedside evaluated the patient with me tonight.   Patient is on NS at 75cc/hr and patient is making urine.  Will follow.   Call RRT if needed

## 2017-08-29 NOTE — ED Notes (Signed)
Patient readjusted in bed.  After repositioning, pt c/o some queasiness.  IV zofran given .  Pt denies itchiness, no redness noted to IV line running antibiotic.  Low concern at this time for allergic reaction to ABX.  Will continue to monitor.

## 2017-08-29 NOTE — Progress Notes (Signed)
Patient's BP trended up initially after bolus started, but declined after turning patient in bed. Patient remains asymptomatic and stable otherwise. Will continue to monitor.

## 2017-08-29 NOTE — Care Management Note (Signed)
Case Management Note  Patient Details  Name: Sharon Warner MRN: [id number] Date of Birth: May 15, 1951  Subjective/Objective:        Admitted with catheter associated urinary tract infection, hx of paraplegia, neurogenic bladder, wheelchair-bound, chronic indwelling Foley catheter, sacral decubitus ulcer, morbid obesity, HTN, type II DM, sacral osteomyelitis, chronic CHF. Reside with spouse, Lon  Ihnen. Pt received home health services (RN,PT) from [facility].           Lon Cheese (Spouse)     [phone]       PCP: Garwin Brothers  Action/Plan: Transition back to home when medically stable.... CM following ......  Expected Discharge Date:                  Expected Discharge Plan:  [place]  In-House Referral:     Discharge planning Services     Post Acute Care Choice:  Resumption of Svcs/PTA Provider, Home Health Choice offered to:  Patient  DME Arranged:    DME Agency:     HH Arranged:  RN, PT [place] Agency:  [facility] (formerly [facility]), resumption order will be needed  from MD to resume services @ d/c.  Status of Service:  In process, will continue to follow  If discussed at Long Length of Stay Meetings, dates discussed:    Additional Comments:  Sharin Mons, RN 08/29/2017, 9:05 AM

## 2017-08-29 NOTE — Progress Notes (Signed)
Pharmacy Antibiotic Note  Sharon Warner is a 66 y.o. female admitted on 08/28/2017 with sepsis.  Pharmacy has been consulted for cefepime and vancomycin dosing.  Plan: Give cefepime 2g IV x 1, then start cefepime 1g IV Q8h Give vancomycin 1g IV x 1, then start vancomycin 500mg  IV Q12h Monitor clinical picture, renal function, VT prn F/U C&S, abx deescalation / LOT   Height: 5\' 1"  (154.9 cm) Weight: 134 lb 12.8 oz (61.1 kg) IBW/kg (Calculated) : 47.8  Temp (24hrs), Avg:99.2 F (37.3 C), Min:98.3 F (36.8 C), Max:102.5 F (39.2 C)  Recent Labs  Lab 08/28/17 1654 08/28/17 1824 08/29/17 0328  WBC 11.7*  --  15.8*  CREATININE 0.74  --  0.67  LATICACIDVEN  --  0.72  --     Estimated Creatinine Clearance: 59.6 mL/min (by C-G formula based on SCr of 0.67 mg/dL).    Allergies  Allergen Reactions  . Influenza Vaccines Other (See Comments)    Patient lost feeling in her legs and that spread  . Levaquin [Levofloxacin In D5w] Nausea Only  . Penicillins Other (See Comments)    From childhood: Has patient had a PCN reaction causing immediate rash, facial/tongue/throat swelling, SOB or lightheadedness with hypotension: Unknown Has patient had a PCN reaction causing severe rash involving mucus membranes or skin necrosis: Unknown Has patient had a PCN reaction that required hospitalization: No Has patient had a PCN reaction occurring within the last 10 years: No If all of the above answers are "NO", then may proceed with Cephalosporin use.  . Simvastatin Other (See Comments)    Reaction not recalled; PATIENT STATED THAT SHE "WILL NOT TAKE"  . Statins Other (See Comments)    PATIENT STATED THAT SHE "WILL NOT TAKE" THESE    Thank you for allowing pharmacy to be a part of this patient's care.  Elenor Quinones, PharmD, BCPS Clinical Pharmacist Pager [phone] 08/29/2017 2:18 PM

## 2017-08-30 ENCOUNTER — Inpatient Hospital Stay (HOSPITAL_COMMUNITY): Payer: BLUE CROSS/BLUE SHIELD

## 2017-08-30 DIAGNOSIS — J9621 Acute and chronic respiratory failure with hypoxia: Secondary | ICD-10-CM

## 2017-08-30 DIAGNOSIS — I959 Hypotension, unspecified: Secondary | ICD-10-CM

## 2017-08-30 LAB — BASIC METABOLIC PANEL
Anion gap: 6 (ref 5–15)
BUN: 25 mg/dL — AB (ref 6–20)
CO2: 22 mmol/L (ref 22–32)
CREATININE: 0.63 mg/dL (ref 0.44–1.00)
Calcium: 10.9 mg/dL — ABNORMAL HIGH (ref 8.9–10.3)
Chloride: 105 mmol/L (ref 101–111)
GFR calc Af Amer: >60 mL/min (ref >60)
Glucose, Bld: 101 mg/dL — ABNORMAL HIGH (ref 65–99)
POTASSIUM: 3.5 mmol/L (ref 3.5–5.1)
SODIUM: 133 mmol/L — AB (ref 135–145)

## 2017-08-30 LAB — BLOOD CULTURE ID PANEL (REFLEXED)
Acinetobacter baumannii: NOT DETECTED
CANDIDA TROPICALIS: NOT DETECTED
Candida albicans: NOT DETECTED
Candida glabrata: NOT DETECTED
Candida krusei: NOT DETECTED
Candida parapsilosis: NOT DETECTED
ENTEROCOCCUS SPECIES: NOT DETECTED
Enterobacter cloacae complex: NOT DETECTED
Enterobacteriaceae species: NOT DETECTED
Escherichia coli: NOT DETECTED
Haemophilus influenzae: NOT DETECTED
KLEBSIELLA PNEUMONIAE: NOT DETECTED
Klebsiella oxytoca: NOT DETECTED
LISTERIA MONOCYTOGENES: NOT DETECTED
Methicillin resistance: NOT DETECTED
NEISSERIA MENINGITIDIS: NOT DETECTED
PROTEUS SPECIES: NOT DETECTED
Pseudomonas aeruginosa: NOT DETECTED
SERRATIA MARCESCENS: NOT DETECTED
STAPHYLOCOCCUS AUREUS BCID: NOT DETECTED
STAPHYLOCOCCUS SPECIES: DETECTED — AB
STREPTOCOCCUS AGALACTIAE: NOT DETECTED
STREPTOCOCCUS PYOGENES: NOT DETECTED
STREPTOCOCCUS SPECIES: NOT DETECTED
Streptococcus pneumoniae: NOT DETECTED

## 2017-08-30 LAB — RESPIRATORY PANEL BY PCR
ADENOVIRUS-RVPPCR: NOT DETECTED
Bordetella pertussis: NOT DETECTED
CORONAVIRUS OC43-RVPPCR: NOT DETECTED
Chlamydophila pneumoniae: NOT DETECTED
Coronavirus 229E: NOT DETECTED
Coronavirus HKU1: NOT DETECTED
Coronavirus NL63: NOT DETECTED
INFLUENZA A-RVPPCR: NOT DETECTED
INFLUENZA B-RVPPCR: NOT DETECTED
METAPNEUMOVIRUS-RVPPCR: NOT DETECTED
MYCOPLASMA PNEUMONIAE-RVPPCR: NOT DETECTED
PARAINFLUENZA VIRUS 2-RVPPCR: NOT DETECTED
Parainfluenza Virus 1: NOT DETECTED
Parainfluenza Virus 3: NOT DETECTED
Parainfluenza Virus 4: NOT DETECTED
RESPIRATORY SYNCYTIAL VIRUS-RVPPCR: NOT DETECTED
RHINOVIRUS / ENTEROVIRUS - RVPPCR: NOT DETECTED

## 2017-08-30 LAB — SEDIMENTATION RATE: SED RATE: 42 mm/h — AB (ref 0–22)

## 2017-08-30 LAB — CBC WITH DIFFERENTIAL/PLATELET
BASOS ABS: 0 10*3/uL (ref 0.0–0.1)
Basophils Relative: 0 %
EOS ABS: 0 10*3/uL (ref 0.0–0.7)
Eosinophils Relative: 0 %
HCT: 28.6 % — ABNORMAL LOW (ref 36.0–46.0)
Hemoglobin: 8.4 g/dL — ABNORMAL LOW (ref 12.0–15.0)
LYMPHS ABS: 4.2 10*3/uL — AB (ref 0.7–4.0)
Lymphocytes Relative: 26 %
MCH: 21.4 pg — ABNORMAL LOW (ref 26.0–34.0)
MCHC: 29.4 g/dL — ABNORMAL LOW (ref 30.0–36.0)
MCV: 73 fL — ABNORMAL LOW (ref 78.0–100.0)
MONO ABS: 1 10*3/uL (ref 0.1–1.0)
MONOS PCT: 6 %
Neutro Abs: 10.9 10*3/uL — ABNORMAL HIGH (ref 1.7–7.7)
Neutrophils Relative %: 68 %
PLATELETS: 350 10*3/uL (ref 150–400)
RBC: 3.92 MIL/uL (ref 3.87–5.11)
RDW: 18.4 % — AB (ref 11.5–15.5)
WBC: 16.1 10*3/uL — ABNORMAL HIGH (ref 4.0–10.5)

## 2017-08-30 LAB — GLUCOSE, CAPILLARY
GLUCOSE-CAPILLARY: 104 mg/dL — AB (ref 65–99)
GLUCOSE-CAPILLARY: 68 mg/dL (ref 65–99)
GLUCOSE-CAPILLARY: 99 mg/dL (ref 65–99)
Glucose-Capillary: 106 mg/dL — ABNORMAL HIGH (ref 65–99)
Glucose-Capillary: 125 mg/dL — ABNORMAL HIGH (ref 65–99)
Glucose-Capillary: 108 mg/dL — ABNORMAL HIGH (ref 65–99)

## 2017-08-30 LAB — C-REACTIVE PROTEIN: CRP: 13.2 mg/dL — AB (ref <1.0)

## 2017-08-30 LAB — PROCALCITONIN: PROCALCITONIN: 0.96 ng/mL

## 2017-08-30 LAB — CORTISOL: Cortisol, Plasma: 16.8 ug/dL

## 2017-08-30 LAB — MRSA PCR SCREENING: MRSA by PCR: NEGATIVE

## 2017-08-30 MED ORDER — FUROSEMIDE 10 MG/ML IJ SOLN: 40.0000 mg | Freq: Once | INTRAMUSCULAR | Status: DC

## 2017-08-30 MED ORDER — LEVALBUTEROL HCL 1.25 MG/0.5ML IN NEBU
INHALATION_SOLUTION | RESPIRATORY_TRACT | Status: AC
  Administered 2017-08-30: 1.25 mg via RESPIRATORY_TRACT
  Filled 2017-08-30: qty 0.5

## 2017-08-30 MED ORDER — DM-GUAIFENESIN ER 30-600 MG PO TB12
1.0000 | ORAL_TABLET | Freq: Two times a day (BID) | ORAL | Status: DC
  Administered 2017-08-30 – 2017-09-03 (×9): 1 via ORAL
  Filled 2017-08-30 (×10): qty 1

## 2017-08-30 MED ORDER — MIDODRINE HCL 2.5 MG PO TABS
2.5000 mg | ORAL_TABLET | Freq: Three times a day (TID) | ORAL | Status: DC
  Administered 2017-08-30 – 2017-09-01 (×6): 2.5 mg via ORAL
  Filled 2017-08-30 (×9): qty 1

## 2017-08-30 MED ORDER — AZITHROMYCIN 500 MG IV SOLR
500.0000 mg | INTRAVENOUS | Status: DC
  Administered 2017-08-30 – 2017-09-02 (×4): 500 mg via INTRAVENOUS
  Filled 2017-08-30 (×5): qty 500

## 2017-08-30 MED ORDER — FUROSEMIDE 10 MG/ML IJ SOLN
40.0000 mg | Freq: Once | INTRAMUSCULAR | Status: AC
  Administered 2017-08-30: 40 mg via INTRAVENOUS
  Filled 2017-08-30: qty 4

## 2017-08-30 MED ORDER — LEVALBUTEROL HCL 0.63 MG/3ML IN NEBU
0.6300 mg | INHALATION_SOLUTION | Freq: Four times a day (QID) | RESPIRATORY_TRACT | Status: DC
  Administered 2017-08-30 – 2017-09-03 (×16): 0.63 mg via RESPIRATORY_TRACT
  Filled 2017-08-30 (×31): qty 3

## 2017-08-30 MED ORDER — LEVALBUTEROL HCL 0.63 MG/3ML IN NEBU
0.6300 mg | INHALATION_SOLUTION | RESPIRATORY_TRACT | Status: DC | PRN
  Administered 2017-09-03: 0.63 mg via RESPIRATORY_TRACT
  Filled 2017-08-30 (×2): qty 3

## 2017-08-30 MED ORDER — LEVALBUTEROL HCL 1.25 MG/0.5ML IN NEBU
1.2500 mg | INHALATION_SOLUTION | RESPIRATORY_TRACT | Status: AC
  Administered 2017-08-30: 1.25 mg via RESPIRATORY_TRACT

## 2017-08-30 NOTE — Progress Notes (Signed)
Titrated the patient oxygen  down to to her baseline 2LPM.  Oxygen saturations of upper 90's.  Will continue to monitor and notify as needed.

## 2017-08-30 NOTE — Progress Notes (Signed)
Patient had acute drop in oxygen saturations.  RN increased oxygen and patient was placed on NRB 15L.  When I arrived, patient stated she was exhausted, felt congested and it was hard to take breath in.    Lung sounds coarse crackles in the bases, diminished throughout. RN paged [facility] NP. SBP have been in the upper 70-80s, MAP 55-65. Skin warm to touch, patient was flushed in her face. [facility] NP came to the bedside.    Interventions: -- Xopenox -- CXR -- Labs (draw morning labs earlier)  After Xopenox, sats improvement quickly and patient was weaned to 4L [place] and sats were 93-96%. CXR + development of left perihilar opacities from CT yesterday, may be pulmonary edema, infectious or atelectasis. Patient has voided 625 last night and is net negative 1.4L. IV VAST RN was able place 2nd IV as well.   Plan: will follow as needed.

## 2017-08-30 NOTE — Progress Notes (Signed)
Tech asked patient if she would like to wash up, patient stated that she was already washed up earlier this morning and did not want to receive another until later tonight or tomorrow.

## 2017-08-30 NOTE — Progress Notes (Signed)
Called by RN for patient with decreasing sats on [place] 6lpm to 80's and placed back on NRB.  On my arrival to patients room, RN MD and family at bedside.  Sats 100% on NRB, RR 24, HR 111, SBP 121.  RT called to room.  CCM calle dto evaluate patient.  No RRt intervention.  Patient to transfer to ICU

## 2017-08-30 NOTE — Progress Notes (Signed)
Patient oxygen  sats dropped below 80's on 2LPM in which she uses at homes.  Increased the patient oxygen to 4LPM and elevated the bed.  No change.  Increased to 6LPM which increased oxygen to lower 80's then back to 79%.  Placed the patient on non rebreather.  Asculutated the lungs.Crackles in  bases.  Patient faced flushed.  Notified rapid response nurse and  on call NP X. Blount of the change in patient condition.

## 2017-08-30 NOTE — Consult Note (Addendum)
PULMONARY / CRITICAL CARE MEDICINE   Name: Sharon Warner MRN: [id number] DOB: [birth year]    ADMISSION DATE:  08/28/2017 CONSULTATION DATE:  08/30/2017   CHIEF COMPLAINT: The patient is referred for hypotension  HISTORY OF PRESENT ILLNESS:   This is a 66 year old bedbound paraplegic who presented after having transient chest pain but while being repositioned in bed.  The chest pain disappeared essentially immediately but she has had persistent chills and chest congestion.  Objective fevers as high as 102 degrees have been reported.  In addition to her chest congestion.  She has other potential provocations for the fever including a chronic indwelling Foley catheter and a large decubitus ulcer.  Her white count is elevated to 16,000 ever she has had no elevation in her lactic acid.  Patient tells me that she is on an agent similar to Midrin to keep her blood pressure up at home which she is currently not receiving in the hospital.  Her only complaint at the present time is difficulty in clearing respiratory secretions and some very slight dyspnea.  A CTA has been obtained of her chest which shows no evidence of pulmonary embolism and only a very tiny area of groundglass opacity in the right upper lobe.  PAST MEDICAL HISTORY :  She  has a past medical history of Acute on chronic systolic CHF (congestive heart failure) ([place]), Chronic heart failure ([place]), Chronic indwelling Foley catheter, Diabetes mellitus without complication ([place]), Enlarged thoracic aorta ([place]) (03/14/2016), H/O paraplegia, Hypertension, Lumbar spondylosis, Morbid obesity ([place]), OSA on CPAP, and Sacral decubitus ulcer, stage IV ([place]).  PAST SURGICAL HISTORY: She  has a past surgical history that includes Vaginal hysterectomy; Joint replacement (Right); and Tonsillectomy.  Allergies  Allergen Reactions  . Influenza Vaccines Other (See Comments)    Patient lost feeling in her legs and that spread  . Levaquin [Levofloxacin In D5w]  Nausea Only  . Penicillins Other (See Comments)    From childhood: Has patient had a PCN reaction causing immediate rash, facial/tongue/throat swelling, SOB or lightheadedness with hypotension: Unknown Has patient had a PCN reaction causing severe rash involving mucus membranes or skin necrosis: Unknown Has patient had a PCN reaction that required hospitalization: No Has patient had a PCN reaction occurring within the last 10 years: No If all of the above answers are "NO", then may proceed with Cephalosporin use.  . Simvastatin Other (See Comments)    Reaction not recalled; PATIENT STATED THAT SHE "WILL NOT TAKE"  . Statins Other (See Comments)    PATIENT STATED THAT SHE "WILL NOT TAKE" THESE    No current facility-administered medications on file prior to encounter.    Current Outpatient Medications on File Prior to Encounter  Medication Sig  . Ascorbic Acid (VITAMIN C PO) Take 1 tablet by mouth daily.  Marland Kitchen aspirin EC 81 MG tablet Take 81 mg by mouth daily.  . Biotin 10000 MCG TABS Take 10,000 mcg by mouth daily.  . Cholecalciferol (VITAMIN D-3) 1000 units CAPS Take 1,000 Units by mouth daily.  . ciclopirox (PENLAC) 8 % solution Apply 1 application topically See admin instructions. TO BOTH FEET AT BEDTIME: Apply over nail and surrounding skin. Apply daily over previous coat. After seven (7) days, may remove with alcohol and continue cycle.  Marland Kitchen CRANBERRY PO Take 1 capsule by mouth daily.  . ferrous sulfate 325 (65 FE) MG tablet Take 325 mg by mouth daily.  . fluticasone (FLONASE) 50 MCG/ACT nasal spray Place 1 spray into both nostrils daily.  Marland Kitchen  H-CHLOR 12 0.125 % SOLN APPLY TO SACRUM 3 TIMES A WEEK: CLEAN AREA, PAT DRY, PACK WITH SOAKED GAUZE, COVER  . Homeopathic Products (ZICAM COLD REMEDY) TBDP Take 1 tablet by mouth daily as needed (for cold symptoms).  Marland Kitchen HYDROcodone-acetaminophen (NORCO) 7.5-325 MG tablet Take 1 tablet by mouth daily as needed (for severe pain).   . Melatonin 3 MG TABS  Take 3 mg by mouth at bedtime as needed (for sleep).  . Menthol, Topical Analgesic, (BIOFREEZE) 4 % GEL Apply 1 application topically See admin instructions. APPLY 1-3 TIMES A DAY TO PAINFUL SITES  . metoprolol succinate (TOPROL-XL) 25 MG 24 hr tablet Take 12.5 mg by mouth daily.  . midodrine (PROAMATINE) 2.5 MG tablet Take 1 tablet (2.5 mg total) by mouth 3 (three) times daily with meals.  . Multiple Vitamins-Minerals (CENTRUM SILVER 50+WOMEN) TABS Take 1 tablet by mouth daily.  . naproxen (NAPROSYN) 500 MG tablet Take 500 mg by mouth daily as needed for pain.  Marland Kitchen Olopatadine HCl 0.2 % SOLN Place 1 drop into both eyes 2 (two) times daily as needed (for itching).   . promethazine (PHENERGAN) 25 MG tablet Take 25 mg by mouth every 6 (six) hours as needed for nausea/vomiting.  . senna-docusate (SENOKOT-S) 8.6-50 MG tablet Take 1 tablet by mouth at bedtime. (Patient taking differently: Take 1 tablet by mouth at bedtime as needed for mild constipation. )  . traMADol-acetaminophen (ULTRACET) 37.5-325 MG tablet Take 1 tablet by mouth every 4 (four) hours as needed (for pain).  . zinc sulfate 220 (50 Zn) MG capsule Take 220 mg by mouth daily.  . Lactobacillus TABS Take 1 tablet by mouth 2 (two) times daily.     FAMILY HISTORY:  Her indicated that her mother is deceased. She indicated that her father is deceased. She indicated that the status of her maternal grandmother is unknown. She indicated that the status of her maternal grandfather is unknown.   SOCIAL HISTORY: She  reports that  has never smoked. she has never used smokeless tobacco. She reports that she does not drink alcohol or use drugs.  REVIEW OF SYSTEMS:   She reports that her paraplegia is suspected of being secondary to embolization.  She is not aware of ever having been in atrial fibrillation, although she reports having had difficulties with tachyarrhythmias in the past.  Her diabetes has not been difficult to control at home, and she  has no known history of thyroid disease.  Remainder of a 10 system review of systems is essentially noncontributory. SUBJECTIVE:  As above  VITAL SIGNS: BP (!) 81/55   Pulse 80   Temp 97.8 F (36.6 C) (Oral)   Resp 20   Ht 5\' 1"  (1.549 m)   Wt 134 lb 12.8 oz (61.1 kg)   SpO2 97%   BMI 25.47 kg/m   HEMODYNAMICS:    VENTILATOR SETTINGS: FiO2 (%):  [2 %] 2 %  INTAKE / OUTPUT: I/O last 3 completed shifts: In: 25 [P.O.:120; I.V.:50; IV Piggyback:250] Out: 9983 [Urine:1825]  PHYSICAL EXAMINATION: General: This is a chronically ill-appearing elderly white female who is in no overt distress.  She clearly has difficulty clearing her respiratory secretions and has a very muted voice. Neuro: She is oriented and appropriate. Cardiovascular: S1 and S2 are regular without murmur rub or gallop. Lungs: Respirations are unlabored, there is symmetric air movement, there are a few scattered rhonchi, and no wheezes. Abdomen: There is an old well-healed midline incision with a left upper quadrant  ostomy.  There are no masses guarding tenderness or rebound.  LABS:  BMET Recent Labs  Lab 08/28/17 1654 08/29/17 0328 08/30/17 0402  NA 133* 131* 133*  K 3.4* 3.5 3.5  CL 102 102 105  CO2 21* 19* 22  BUN 24* 23* 25*  CREATININE 0.74 0.67 0.63  GLUCOSE 84 141* 101*    Electrolytes Recent Labs  Lab 08/28/17 1654 08/29/17 0328 08/30/17 0402  CALCIUM 11.4* 10.8* 10.9*    CBC Recent Labs  Lab 08/28/17 1654 08/29/17 0328 08/30/17 0402  WBC 11.7* 15.8* 16.1*  HGB 10.0* 9.6* 8.4*  HCT 33.6* 32.2* 28.6*  PLT 380 402* 350    Coag's No results for input(s): APTT, INR in the last 168 hours.  Sepsis Markers Recent Labs  Lab 08/28/17 1824 08/29/17 1449 08/29/17 1450 08/29/17 1818 08/30/17 0402  LATICACIDVEN 0.72  --  1.1 1.0  --   PROCALCITON  --  0.33  --   --  0.96    ABG No results for input(s): PHART, PCO2ART, PO2ART in the last 168 hours.  Liver Enzymes No  results for input(s): AST, ALT, ALKPHOS, BILITOT, ALBUMIN in the last 168 hours.  Cardiac Enzymes Recent Labs  Lab 08/28/17 2225 08/29/17 0328 08/29/17 0735  TROPONINI <0.03 <0.03 <0.03    Glucose Recent Labs  Lab 08/29/17 1803 08/29/17 2058 08/30/17 0750  GLUCAP 115* 130* 68    Imaging Dg Chest Port 1 View  Result Date: 08/30/2017 CLINICAL DATA:  Shortness of breath. EXAM: PORTABLE CHEST 1 VIEW COMPARISON:  Radiograph 2 days prior 08/28/2017, CT yesterday. FINDINGS: Borderline cardiomegaly. Development of left perihilar opacities from CT yesterday. Compressive atelectasis at the right lung base secondary to elevated hemidiaphragm. Mild vascular congestion. No definite pleural fluid or pneumothorax. IMPRESSION: Development of left perihilar opacities from CT yesterday, may be pulmonary edema, infectious or atelectasis. Elevated right hemidiaphragm with adjacent basilar atelectasis. Electronically Signed   By: Jeb Levering M.D.   On: 08/30/2017 05:30     STUDIES:    CULTURES: Urine culture on admission is growing mixed flora which in her case with a chronic Foley may be real.  Single blood culture grew coag negative staph.  ANTIBIOTICS: Vancomycin and cefepime  DISCUSSION: This is a 66 year old paraplegic with a chronic indwelling Foley who presented on 11/29 with transient chest pain followed by chills and chest congestion.  We were asked to see the patient for hypotension.  He is not toxic appearing at present, does not have a significant lactic acidosis, and reports to me that she has hypotension at baseline for which she takes a oral agent at home.  Records indicate that she has been on Midodrine, although she thinks it is a different agent.  Clearly she does have URI symptoms as well as potential sources of sepsis including a decubitus ulcer and a chronic indwelling Foley.  She has no indication of gross hypoperfusion I do not anticipate immediate transfer to the  intensive care unit.  I have added her Midrin back and ordered a random cortisol.  I will be back to reevaluate the patient to see if she does respond to that intervention.  In the interim I have not Comycin and cefepime, I have obtained a PCR panel for atypical aspiratory pathogens including influenza.  ASSESSMENT / PLAN:  PULMONARY A: Productive cough and slight sense of dyspnea.  Recent CTA shows no evidence of pulmonary embolism.  There is very area of groundglass infiltrate in the right upper lobe.  As noted I have sent a screen for atypical or viral infections which might prompt a change in therapy.   CARDIOVASCULAR A: It appears her baseline blood pressure is marginal without an agent like Midrin.  I reviewed her EKG which did not show any acute event suggesting an insult to her already marginal LV function.   RENAL A: Creatinine is currently normal    INFECTIOUS A: Although she does not appear to have overt systemic sepsis she does have potential sources of infection including atypical respiratory infection, UTI, and a decubitus ulcer.  She is suffered from osteomyelitis associated with the latter in the past.  I have made no change in her current antibiotic regimen.  I did note a blood culture positive for coag negative staph on admission and I suspect that it is a contaminant   ENDOCRINE A: There is no difficulty in controlling her diabetes suggesting we do not have a raging septic provocation for her hypotension.    Lars Masson, MD Pulmonary and [place] Pager: [phone]  08/30/2017, 8:17 AM  She is not clearing her secretions and clearly having more respiratory distress. Will make arrangements to transfer to MICU for closer observation. Respiratory virus pcr is negative.

## 2017-08-30 NOTE — Progress Notes (Signed)
PHARMACY - PHYSICIAN COMMUNICATION CRITICAL VALUE ALERT - BLOOD CULTURE IDENTIFICATION (BCID)  Sharon Warner is an 66 y.o. female who presented to [facility] on 08/28/2017 with a chief complaint of sepsis   Name of physician (or Provider) Contacted: APP [employer] via text page  Current antibiotics: Vanc/cefepime  Changes to prescribed antibiotics recommended:  Patient is on recommended antibiotics - No changes needed  Results for orders placed or performed during the hospital encounter of 08/28/17  Blood Culture ID Panel (Reflexed) (Collected: 08/28/2017  4:54 PM)  Result Value Ref Range   Enterococcus species NOT DETECTED NOT DETECTED   Listeria monocytogenes NOT DETECTED NOT DETECTED   Staphylococcus species DETECTED (A) NOT DETECTED   Staphylococcus aureus NOT DETECTED NOT DETECTED   Methicillin resistance NOT DETECTED NOT DETECTED   Streptococcus species NOT DETECTED NOT DETECTED   Streptococcus agalactiae NOT DETECTED NOT DETECTED   Streptococcus pneumoniae NOT DETECTED NOT DETECTED   Streptococcus pyogenes NOT DETECTED NOT DETECTED   Acinetobacter baumannii NOT DETECTED NOT DETECTED   Enterobacteriaceae species NOT DETECTED NOT DETECTED   Enterobacter cloacae complex NOT DETECTED NOT DETECTED   Escherichia coli NOT DETECTED NOT DETECTED   Klebsiella oxytoca NOT DETECTED NOT DETECTED   Klebsiella pneumoniae NOT DETECTED NOT DETECTED   Proteus species NOT DETECTED NOT DETECTED   Serratia marcescens NOT DETECTED NOT DETECTED   Haemophilus influenzae NOT DETECTED NOT DETECTED   Neisseria meningitidis NOT DETECTED NOT DETECTED   Pseudomonas aeruginosa NOT DETECTED NOT DETECTED   Candida albicans NOT DETECTED NOT DETECTED   Candida glabrata NOT DETECTED NOT DETECTED   Candida krusei NOT DETECTED NOT DETECTED   Candida parapsilosis NOT DETECTED NOT DETECTED   Candida tropicalis NOT DETECTED NOT DETECTED    Wynona Neat, PharmD, BCPS  08/30/2017  2:06 AM

## 2017-08-30 NOTE — Progress Notes (Signed)
PROGRESS NOTE  Sharon Warner OEV:[id number] DOB: 1951-09-28 DOA: 08/28/2017 PCP: Garwin Brothers, MD  HPI/Recap of past 24 hours: HPI from Dr Jennette Kettle on 08/28/17 Sharon Warner is a 66 y.o. female with medical history significant of paraplegia, neurogenic bladder, wheelchair-bound, chronic indwelling Foley catheter, sacral decubitus ulcer, morbid obesity, HTN, type II DM, sacral osteomyelitis, CHF presents to the ED, with c/o SOB, fevers, generalized weakness, chest congestion with mild productive cough for a couple of days, not improving. PTA, pt developed L sided chest pain, while husband was moving patient. CP lasted for a few mins and stopped after repositioning. CP does not radiate, and nothing made it better. Pt also noted cloudy urine recently and suspects she may have a UTI. Pt has a chronic stage 4 sacral decubitus, seeing wound care, but doesn't appear to be grossly infected. Of note, pt was admitted in 03/2017 for sepsis/bacteremia with sacroiliitis/UTI.  BCx that admission grew out proteus and group B strep of which she completed long duration course of Keflex and flagyl, off ABx since end of Aug. In the ED, pt was noted to be tachycardic, with mild leukocytosis. Admitted for sepsis likely due to UTI  Overnight, pt sat dropped below the 80s on 2L, pt was eventually placed on non-rebreather until sats improved. CXR done showed congestion. This am, pt was placed back on O2 via [place]. Held IVF due to worsening congestion. Called a consult to PCCM for further eval. Pt reports dyspnea, denies any chest pain, abdominal pain, N/V/d/C, afebrile.   Assessment/Plan: Principal Problem:   Catheter-associated urinary tract infection (HCC) Active Problems:   Paraplegia (HCC)   Neurogenic bladder   Hypercalcemia   Sacral decubitus ulcer, stage IV (HCC)   Hypotension  #Sepsis Febrile, leukocytosis, tachycardic, hypotension on presentation Likely due to CAUTI Vs CAP Vs sacral wound Patient is  alert, awake, oriented, cooperative, no respiratory distress Lactic acid within normal limits, pro calcitonin 0.33 Chest x-ray negative, repeat showed congestion CT angio negative for PE but shows right upper lobe density could be a pneumonia and right lower lobe mucous plug UA showed large leukocytes, WBC TNTC, nitrite positive Blood culture on 1 bottle showed coagulase neg staph, likely contaminant Urine culture showed multiple species, rec recollection Status post 2 L of IV bolus, HELD gentle hydration at 75 cc/h due to congestion Discontinue IV Rocephin and ceftriaxone Start IV vancomycin and cefepime Change Foley catheter Switch patient to stepdown unit for closer monitoring  #Acute hypoxic respiratory failure Likely due to sepsis/CAP Vs pulm congestion due to vol overload Repeat CXR showed pulm edema CT angio showed right upper lobe density could be a pneumonia and right lower lobe mucous plug Duonebs, Chest PT to help with mucous plug as pt is unable to cough up secretions Speech therapy as pt seems to have lost her voice and also has a weak cough reflex  #Sacral decubitus ulcer stage IV No new drainage or obvious signs of infection from ulcer Due to sepsis we will cover with Vancomycin Wound care consult placed  #History of sacral osteomyelitis/bacteremia Diagnosed in 03/2017 with bacteremia Blood culture during that admission grew Proteus and group B strep, both sensitive to Rocephin Completed treatment with Keflex and Flagyl in 04/2017 If patient does not improve, will get an MRI of the sacrum  #CHF Echo in 03/2017 showed LVEF 25-30% Monitor closely Hold home Toprol 12.5mg  due to hypotension  #Hypertension Currently hypotensive Hold home Toprol as above  #DM type II Last A1c in 03/2017,  5.0 Diet controlled SSI, Accu-Cheks, hypoglycemic protocol  #Hypokalemia Replace as needed  #Paraplegia and neurogenic bladder Replace Foley    Code Status: Full  Family  Communication: None at bedside  Disposition Plan: Home once stable   Consultants:  PCCM  Procedures:  None  Antimicrobials:  IV vancomycin  IV cefepime  DVT prophylaxis: Lovenox   Objective: Vitals:   08/30/17 1252 08/30/17 1321 08/30/17 1351 08/30/17 1421  BP:  (!) 82/64 93/64   Pulse: (!) 101     Resp: 20 (!) 25 (!) 23   Temp:      TempSrc:      SpO2: 97%   96%  Weight:      Height:        Intake/Output Summary (Last 24 hours) at 08/30/2017 1443 Last data filed at 08/30/2017 1431 Gross per 24 hour  Intake 360 ml  Output 2975 ml  Net -2615 ml   Filed Weights   08/28/17 1652 08/29/17 0427  Weight: 65.8 kg (145 lb) 61.1 kg (134 lb 12.8 oz)    Exam:   General: Alert, awake, oriented x3, no distress, chronically ill appearing  Cardiovascular: S1-S2 present, no added heart sounds  Respiratory: Bibasilar crackles noted  Abdomen: Soft, nontender, nondistended, bowel sounds present  Musculoskeletal: No bilateral pedal edema  Skin: Stage IV sacral decubitus ulcer  Psychiatry: Normal mood   Data Reviewed: CBC: Recent Labs  Lab 08/28/17 1654 08/29/17 0328 08/30/17 0402  WBC 11.7* 15.8* 16.1*  NEUTROABS  --   --  10.9*  HGB 10.0* 9.6* 8.4*  HCT 33.6* 32.2* 28.6*  MCV 71.8* 71.6* 73.0*  PLT 380 402* 440   Basic Metabolic Panel: Recent Labs  Lab 08/28/17 1654 08/29/17 0328 08/30/17 0402  NA 133* 131* 133*  K 3.4* 3.5 3.5  CL 102 102 105  CO2 21* 19* 22  GLUCOSE 84 141* 101*  BUN 24* 23* 25*  CREATININE 0.74 0.67 0.63  CALCIUM 11.4* 10.8* 10.9*   GFR: Estimated Creatinine Clearance: 59.6 mL/min (by C-G formula based on SCr of 0.63 mg/dL). Liver Function Tests: No results for input(s): AST, ALT, ALKPHOS, BILITOT, PROT, ALBUMIN in the last 168 hours. No results for input(s): LIPASE, AMYLASE in the last 168 hours. No results for input(s): AMMONIA in the last 168 hours. Coagulation Profile: No results for input(s): INR, PROTIME in the  last 168 hours. Cardiac Enzymes: Recent Labs  Lab 08/28/17 2225 08/29/17 0328 08/29/17 0735  TROPONINI <0.03 <0.03 <0.03   BNP (last 3 results) No results for input(s): PROBNP in the last 8760 hours. HbA1C: No results for input(s): HGBA1C in the last 72 hours. CBG: Recent Labs  Lab 08/29/17 1803 08/29/17 2058 08/30/17 0750 08/30/17 0820 08/30/17 1213  GLUCAP 115* 130* 68 106* 99   Lipid Profile: No results for input(s): CHOL, HDL, LDLCALC, TRIG, CHOLHDL, LDLDIRECT in the last 72 hours. Thyroid Function Tests: No results for input(s): TSH, T4TOTAL, FREET4, T3FREE, THYROIDAB in the last 72 hours. Anemia Panel: No results for input(s): VITAMINB12, FOLATE, FERRITIN, TIBC, IRON, RETICCTPCT in the last 72 hours. Urine analysis:    Component Value Date/Time   COLORURINE YELLOW 08/28/2017 1807   APPEARANCEUR TURBID (A) 08/28/2017 1807   LABSPEC 1.011 08/28/2017 1807   PHURINE 6.0 08/28/2017 1807   GLUCOSEU NEGATIVE 08/28/2017 1807   HGBUR MODERATE (A) 08/28/2017 1807   BILIRUBINUR NEGATIVE 08/28/2017 1807   KETONESUR 5 (A) 08/28/2017 1807   PROTEINUR 100 (A) 08/28/2017 1807   NITRITE POSITIVE (A) 08/28/2017 1807  LEUKOCYTESUR LARGE (A) 08/28/2017 1807   Sepsis Labs: @LABRCNTIP (procalcitonin:4,lacticidven:4)  ) Recent Results (from the past 240 hour(s))  Culture, blood (routine x 2)     Status: None (Preliminary result)   Collection Time: 08/28/17  4:54 PM  Result Value Ref Range Status   Specimen Description BLOOD BLOOD LEFT FOREARM  Final   Special Requests   Final    Blood Culture adequate volume BOTTLES DRAWN AEROBIC AND ANAEROBIC   Culture  Setup Time   Final    GRAM POSITIVE COCCI IN CLUSTERS ANAEROBIC BOTTLE ONLY CRITICAL RESULT CALLED TO, READ BACK BY AND VERIFIED WITHAlona Bene PHARMD 0149 08/30/17 A BROWNING    Culture   Final    GRAM POSITIVE COCCI CULTURE REINCUBATED FOR BETTER GROWTH    Report Status PENDING  Incomplete  Blood Culture ID Panel  (Reflexed)     Status: Abnormal   Collection Time: 08/28/17  4:54 PM  Result Value Ref Range Status   Enterococcus species NOT DETECTED NOT DETECTED Final   Listeria monocytogenes NOT DETECTED NOT DETECTED Final   Staphylococcus species DETECTED (A) NOT DETECTED Final    Comment: Methicillin (oxacillin) susceptible coagulase negative staphylococcus. Possible blood culture contaminant (unless isolated from more than one blood culture draw or clinical case suggests pathogenicity). No antibiotic treatment is indicated for blood  culture contaminants. CRITICAL RESULT CALLED TO, READ BACK BY AND VERIFIED WITH: V BRYK PHARMD 0149 08/30/17 A BROWNING    Staphylococcus aureus NOT DETECTED NOT DETECTED Final   Methicillin resistance NOT DETECTED NOT DETECTED Final   Streptococcus species NOT DETECTED NOT DETECTED Final   Streptococcus agalactiae NOT DETECTED NOT DETECTED Final   Streptococcus pneumoniae NOT DETECTED NOT DETECTED Final   Streptococcus pyogenes NOT DETECTED NOT DETECTED Final   Acinetobacter baumannii NOT DETECTED NOT DETECTED Final   Enterobacteriaceae species NOT DETECTED NOT DETECTED Final   Enterobacter cloacae complex NOT DETECTED NOT DETECTED Final   Escherichia coli NOT DETECTED NOT DETECTED Final   Klebsiella oxytoca NOT DETECTED NOT DETECTED Final   Klebsiella pneumoniae NOT DETECTED NOT DETECTED Final   Proteus species NOT DETECTED NOT DETECTED Final   Serratia marcescens NOT DETECTED NOT DETECTED Final   Haemophilus influenzae NOT DETECTED NOT DETECTED Final   Neisseria meningitidis NOT DETECTED NOT DETECTED Final   Pseudomonas aeruginosa NOT DETECTED NOT DETECTED Final   Candida albicans NOT DETECTED NOT DETECTED Final   Candida glabrata NOT DETECTED NOT DETECTED Final   Candida krusei NOT DETECTED NOT DETECTED Final   Candida parapsilosis NOT DETECTED NOT DETECTED Final   Candida tropicalis NOT DETECTED NOT DETECTED Final  Urine culture     Status: Abnormal    Collection Time: 08/28/17  6:14 PM  Result Value Ref Range Status   Specimen Description URINE, CATHETERIZED  Final   Special Requests NONE  Final   Culture MULTIPLE SPECIES PRESENT, SUGGEST RECOLLECTION (A)  Final   Report Status 08/29/2017 FINAL  Final  Culture, blood (routine x 2)     Status: None (Preliminary result)   Collection Time: 08/28/17  8:37 PM  Result Value Ref Range Status   Specimen Description BLOOD RIGHT ANTECUBITAL  Final   Special Requests   Final    Blood Culture adequate volume BOTTLES DRAWN AEROBIC AND ANAEROBIC   Culture NO GROWTH 2 DAYS  Final   Report Status PENDING  Incomplete  Culture, blood (routine x 2)     Status: None (Preliminary result)   Collection Time: 08/29/17  2:48 PM  Result Value Ref Range Status   Specimen Description BLOOD LEFT HAND  Final   Special Requests   Final    BOTTLES DRAWN AEROBIC AND ANAEROBIC Blood Culture adequate volume   Culture NO GROWTH < 24 HOURS  Final   Report Status PENDING  Incomplete  Culture, blood (routine x 2)     Status: None (Preliminary result)   Collection Time: 08/29/17  2:56 PM  Result Value Ref Range Status   Specimen Description BLOOD RIGHT HAND  Final   Special Requests IN PEDIATRIC BOTTLE Blood Culture adequate volume  Final   Culture NO GROWTH < 24 HOURS  Final   Report Status PENDING  Incomplete  Respiratory Panel by PCR     Status: None   Collection Time: 08/30/17  9:03 AM  Result Value Ref Range Status   Adenovirus NOT DETECTED NOT DETECTED Final   Coronavirus 229E NOT DETECTED NOT DETECTED Final   Coronavirus HKU1 NOT DETECTED NOT DETECTED Final   Coronavirus NL63 NOT DETECTED NOT DETECTED Final   Coronavirus OC43 NOT DETECTED NOT DETECTED Final   Metapneumovirus NOT DETECTED NOT DETECTED Final   Rhinovirus / Enterovirus NOT DETECTED NOT DETECTED Final   Influenza A NOT DETECTED NOT DETECTED Final   Influenza B NOT DETECTED NOT DETECTED Final   Parainfluenza Virus 1 NOT DETECTED NOT DETECTED  Final   Parainfluenza Virus 2 NOT DETECTED NOT DETECTED Final   Parainfluenza Virus 3 NOT DETECTED NOT DETECTED Final   Parainfluenza Virus 4 NOT DETECTED NOT DETECTED Final   Respiratory Syncytial Virus NOT DETECTED NOT DETECTED Final   Bordetella pertussis NOT DETECTED NOT DETECTED Final   Chlamydophila pneumoniae NOT DETECTED NOT DETECTED Final   Mycoplasma pneumoniae NOT DETECTED NOT DETECTED Final      Studies: Dg Chest Port 1 View  Result Date: 08/30/2017 CLINICAL DATA:  Shortness of breath. EXAM: PORTABLE CHEST 1 VIEW COMPARISON:  Radiograph 2 days prior 08/28/2017, CT yesterday. FINDINGS: Borderline cardiomegaly. Development of left perihilar opacities from CT yesterday. Compressive atelectasis at the right lung base secondary to elevated hemidiaphragm. Mild vascular congestion. No definite pleural fluid or pneumothorax. IMPRESSION: Development of left perihilar opacities from CT yesterday, may be pulmonary edema, infectious or atelectasis. Elevated right hemidiaphragm with adjacent basilar atelectasis. Electronically Signed   By: Jeb Levering M.D.   On: 08/30/2017 05:30    Scheduled Meds: . aspirin EC  81 mg Oral Daily  . cholecalciferol  1,000 Units Oral Daily  . enoxaparin (LOVENOX) injection  40 mg Subcutaneous Q24H  . feeding supplement (PRO-STAT SUGAR FREE 64)  30 mL Oral BID  . ferrous sulfate  325 mg Oral Daily  . fluticasone  1 spray Each Nare Daily  . insulin aspart  0-9 Units Subcutaneous TID WC  . lactobacillus acidophilus  1 tablet Oral BID  . levalbuterol  0.63 mg Nebulization Q6H  . midodrine  2.5 mg Oral TID WC  . multivitamin with minerals  1 tablet Oral Daily  . protein supplement shake  2 oz Oral BID BM  . sodium hypochlorite   Topical QODAY  . zinc sulfate  220 mg Oral Daily    Continuous Infusions: . ceFEPime (MAXIPIME) IV Stopped (08/30/17 5631)  . vancomycin Stopped (08/30/17 4970)     LOS: 2 days     Alma Friendly, MD [facility]   If 7PM-7AM, please contact night-coverage [email or web address] Password [email or web address] 08/30/2017, 2:43 PM

## 2017-08-30 NOTE — Progress Notes (Signed)
Pt did get a bath in the early morning hours.

## 2017-08-30 NOTE — Progress Notes (Signed)
Called Rapid Response nurse. Patient O2 sats dropped to 80 on 6lpm O2. Placed on non-re breather. Sats returned to 100% but patient labored breathing. Dr Horris Latino at bedside asked to call Critical Care to re-evaluate. Will continue to monitor. Joslyn Hy, MSN, RN, [employer]

## 2017-08-31 ENCOUNTER — Inpatient Hospital Stay (HOSPITAL_COMMUNITY): Payer: BLUE CROSS/BLUE SHIELD

## 2017-08-31 DIAGNOSIS — R0902 Hypoxemia: Secondary | ICD-10-CM

## 2017-08-31 LAB — BASIC METABOLIC PANEL
ANION GAP: 8 (ref 5–15)
BUN: 26 mg/dL — ABNORMAL HIGH (ref 6–20)
CHLORIDE: 105 mmol/L (ref 101–111)
CO2: 23 mmol/L (ref 22–32)
CREATININE: 0.6 mg/dL (ref 0.44–1.00)
Calcium: 11.1 mg/dL — ABNORMAL HIGH (ref 8.9–10.3)
GFR calc non Af Amer: >60 mL/min (ref >60)
Glucose, Bld: 92 mg/dL (ref 65–99)
Potassium: 3.2 mmol/L — ABNORMAL LOW (ref 3.5–5.1)
SODIUM: 136 mmol/L (ref 135–145)

## 2017-08-31 LAB — CBC WITH DIFFERENTIAL/PLATELET
BASOS PCT: 0 %
Basophils Absolute: 0 10*3/uL (ref 0.0–0.1)
EOS PCT: 1 %
Eosinophils Absolute: 0.1 10*3/uL (ref 0.0–0.7)
HCT: 28.5 % — ABNORMAL LOW (ref 36.0–46.0)
HEMOGLOBIN: 8.3 g/dL — AB (ref 12.0–15.0)
Lymphocytes Relative: 26 %
Lymphs Abs: 3.5 10*3/uL (ref 0.7–4.0)
MCH: 21.2 pg — ABNORMAL LOW (ref 26.0–34.0)
MCHC: 29.1 g/dL — ABNORMAL LOW (ref 30.0–36.0)
MCV: 72.9 fL — AB (ref 78.0–100.0)
MONO ABS: 1 10*3/uL (ref 0.1–1.0)
Monocytes Relative: 7 %
Neutro Abs: 9 10*3/uL — ABNORMAL HIGH (ref 1.7–7.7)
Neutrophils Relative %: 66 %
PLATELETS: 335 10*3/uL (ref 150–400)
RBC: 3.91 MIL/uL (ref 3.87–5.11)
RDW: 18.5 % — ABNORMAL HIGH (ref 11.5–15.5)
WBC: 13.6 10*3/uL — AB (ref 4.0–10.5)

## 2017-08-31 LAB — CULTURE, BLOOD (ROUTINE X 2): Special Requests: ADEQUATE

## 2017-08-31 LAB — GLUCOSE, CAPILLARY
GLUCOSE-CAPILLARY: 123 mg/dL — AB (ref 65–99)
GLUCOSE-CAPILLARY: 90 mg/dL (ref 65–99)
Glucose-Capillary: 77 mg/dL (ref 65–99)
Glucose-Capillary: 85 mg/dL (ref 65–99)

## 2017-08-31 LAB — BRAIN NATRIURETIC PEPTIDE: B Natriuretic Peptide: 49.9 pg/mL (ref 0.0–100.0)

## 2017-08-31 MED ORDER — SODIUM CHLORIDE 3 % IN NEBU: 4.0000 mL | INHALATION_SOLUTION | Freq: Every day | RESPIRATORY_TRACT | Status: DC

## 2017-08-31 MED ORDER — SODIUM CHLORIDE 3 % IN NEBU
4.0000 mL | INHALATION_SOLUTION | Freq: Every day | RESPIRATORY_TRACT | Status: DC
  Filled 2017-08-31: qty 4

## 2017-08-31 MED ORDER — SODIUM CHLORIDE 3 % IN NEBU
4.0000 mL | INHALATION_SOLUTION | Freq: Every day | RESPIRATORY_TRACT | Status: AC
  Administered 2017-08-31 – 2017-09-02 (×2): 4 mL via RESPIRATORY_TRACT
  Filled 2017-08-31 (×2): qty 4

## 2017-08-31 MED ORDER — POTASSIUM CHLORIDE CRYS ER 20 MEQ PO TBCR: 40.0000 meq | EXTENDED_RELEASE_TABLET | Freq: Once | ORAL | Status: DC

## 2017-08-31 MED ORDER — POTASSIUM CHLORIDE 20 MEQ/15ML (10%) PO SOLN
40.0000 meq | Freq: Once | ORAL | Status: AC
  Administered 2017-08-31: 40 meq via ORAL
  Filled 2017-08-31: qty 30

## 2017-08-31 NOTE — Plan of Care (Signed)
POC updated

## 2017-08-31 NOTE — Evaluation (Signed)
Clinical/Bedside Swallow Evaluation Patient Details  Name: Sharon Warner MRN: [id number] Date of Birth: May 18, 1951  Today's Date: 08/31/2017 Time: SLP Start Time (ACUTE ONLY): 0855 SLP Stop Time (ACUTE ONLY): 0918 SLP Time Calculation (min) (ACUTE ONLY): 23 min  Past Medical History:  Past Medical History:  Diagnosis Date  . Acute on chronic systolic CHF (congestive heart failure) ([place])   . Chronic heart failure ([place])   . Chronic indwelling Foley catheter    last changed few days ago  . Diabetes mellitus without complication ([place])   . Enlarged thoracic aorta ([place]) 03/14/2016  . H/O paraplegia    ascending paralysis unclear etiology  . Hypertension   . Lumbar spondylosis   . Morbid obesity ([place])   . OSA on CPAP   . Sacral decubitus ulcer, stage IV [facility])    Past Surgical History:  Past Surgical History:  Procedure Laterality Date  . JOINT REPLACEMENT Right    Hip  . TONSILLECTOMY    . VAGINAL HYSTERECTOMY     HPI:  Sharon Warner a 66 y.o.femalewith medical history significant ofparaplegia, neurogenic bladder, wheelchair-bound, chronic indwelling Foley catheter, sacral decubitus ulcer, morbid obesity, HTN, type II DM, sacral osteomyelitis, chronicCHF.  Patient presents to the ED with c/o SOB, fevers. Chest congestion with mild productive cough. Today was at home, husband was helping her roll over, developed acute chest pain anterior L chest, worse with movement. CP stopped after repositioning, but now has SOB, generalized weakness. She notes cloudy urine recently and suspects she may have a UTI. Also has some LUE redness that is new.  She has a chronic stage 4 sacral decubitus, seeing wound care, but doesn't appear to be grossly infected today.     Assessment / Plan / Recommendation Clinical Impression  Clinical swallowing evaluation was completed using thin liquids, pureed material and dry solids.  Oral mechanism exam was completed and remarkable for significant lingual  deviation to the right and generalized lingual weakness.  Facial and labial range of motion and strength appeared to be adequate.  Volitional cough was very weak.  The patient presented with possible oral and pharyngeal dysphagia.  The oral phase was characterized by delayed oral transit given dry solids.  The pharyngeal phase was characterized by immediate cough given serial sips of thin liquids via straw sips.  Recommend continue with a regular diet with thin liquids via small self fed cup sips pending results of MBS to determine current swallowing physiology.  The patient should NOT use straws.   SLP Visit Diagnosis: Dysphagia, oropharyngeal phase (R13.12)    Aspiration Risk  Mild aspiration risk    Diet Recommendation   Regular with thin liquids - no straws, small sips pending MBS.    Medication Administration: Whole meds with puree    Other  Recommendations Oral Care Recommendations: Oral care BID   Follow up Recommendations Other (comment)(TBD)      Frequency and Duration min 2x/week  2 weeks           Swallow Study   General Date of Onset: 08/28/17 HPI: Sharon Warner a 66 y.o.femalewith medical history significant ofparaplegia, neurogenic bladder, wheelchair-bound, chronic indwelling Foley catheter, sacral decubitus ulcer, morbid obesity, HTN, type II DM, sacral osteomyelitis, chronicCHF.  Patient presents to the ED with c/o SOB, fevers. Chest congestion with mild productive cough. Today was at home, husband was helping her roll over, developed acute chest pain anterior L chest, worse with movement. CP stopped after repositioning, but now has SOB, generalized weakness. She  notes cloudy urine recently and suspects she may have a UTI. Also has some LUE redness that is new.  She has a chronic stage 4 sacral decubitus, seeing wound care, but doesn't appear to be grossly infected today.   Type of Study: Bedside Swallow Evaluation Previous Swallow Assessment: None noted at [facility].    Diet Prior to this Study: Regular;Thin liquids Temperature Spikes Noted: No Respiratory Status: Nasal cannula History of Recent Intubation: No Behavior/Cognition: Alert;Cooperative;Pleasant mood Oral Cavity Assessment: Within Functional Limits Oral Care Completed by SLP: No Oral Cavity - Dentition: Adequate natural dentition Vision: Functional for self-feeding Self-Feeding Abilities: Able to feed self Patient Positioning: Upright in bed Baseline Vocal Quality: Normal Volitional Cough: Weak Volitional Swallow: Able to elicit    Oral/Motor/Sensory Function Overall Oral Motor/Sensory Function: Mild impairment Facial ROM: Within Functional Limits Facial Symmetry: Within Functional Limits Facial Strength: Within Functional Limits Lingual ROM: Reduced right(lingual deviation to the right.  ) Lingual Symmetry: Abnormal symmetry right Lingual Strength: Reduced Mandible: Within Functional Limits   Ice Chips Ice chips: Not tested   Thin Liquid Thin Liquid: Impaired Presentation: Cup;Spoon;Straw;Self Fed Pharyngeal  Phase Impairments: Cough - Immediate(given serial sips of thin via straw - not seen given cup sip)    Nectar Thick Nectar Thick Liquid: Not tested   Honey Thick Honey Thick Liquid: Not tested   Puree Puree: Within functional limits Presentation: Spoon   Solid   GO   Solid: Impaired Presentation: Self Fed Oral Phase Impairments: Impaired mastication Oral Phase Functional Implications: Prolonged oral transit        Shelly Flatten, MA, CCC-SLP Acute Rehab SLP [phone] Lamar Sprinkles 08/31/2017,9:27 AM

## 2017-08-31 NOTE — Progress Notes (Signed)
PULMONARY / CRITICAL CARE MEDICINE   Name: Sharon Warner MRN: [id number] DOB: October 29, 1950    ADMISSION DATE:  08/28/2017 CONSULTATION DATE:  08/30/2017   CHIEF COMPLAINT: The patient is referred for hypotension  HISTORY OF PRESENT ILLNESS:   This is a 66 year old bedbound paraplegic who presented after having transient chest pain but while being repositioned in bed.  The chest pain disappeared essentially immediately but she has had persistent chills and chest congestion.  Objective fevers as high as 102 degrees have been reported.  In addition to her chest congestion.  She has other potential provocations for the fever including a chronic indwelling Foley catheter and a large decubitus ulcer.  Her white count is elevated to 16,000 ever she has had no elevation in her lactic acid.  Patient tells me that she is on an agent similar to Midrin to keep her blood pressure up at home which she is currently not receiving in the hospital.  Her only complaint at the present time is difficulty in clearing respiratory secretions and some very slight dyspnea.  A CTA has been obtained of her chest which shows no evidence of pulmonary embolism and only a very tiny area of groundglass opacity in the right upper lobe.   SUBJECTIVE:  Feeling better . Able to eat .  bp some better. Midodrine restarted .   VITAL SIGNS: BP (!) 79/52   Pulse 82   Temp 98.3 F (36.8 C) (Oral)   Resp (!) 25   Ht 5\' 1"  (1.549 m)   Wt 134 lb 12.8 oz (61.1 kg)   SpO2 100%   BMI 25.47 kg/m   HEMODYNAMICS:    VENTILATOR SETTINGS: FiO2 (%):  [55 %-100 %] 55 %  INTAKE / OUTPUT: I/O last 3 completed shifts: In: 1060 [P.O.:360; IV Piggyback:700] Out: 4125 [Urine:4125]  PHYSICAL EXAMINATION: General: This is a chronically ill-appearing elderly white female who is in no overt distress.  Neuro: She is oriented and appropriate. Cardiovascular: S1 and S2 are regular without murmur rub or gallop. Lungs: Respirations are  unlabored, there is symmetric air movement, there are a few scattered rhonchi, and no wheezes. Abdomen: soft , BS + , ostomy   LABS:  BMET Recent Labs  Lab 08/29/17 0328 08/30/17 0402 08/31/17 0507  NA 131* 133* 136  K 3.5 3.5 3.2*  CL 102 105 105  CO2 19* 22 23  BUN 23* 25* 26*  CREATININE 0.67 0.63 0.60  GLUCOSE 141* 101* 92    Electrolytes Recent Labs  Lab 08/29/17 0328 08/30/17 0402 08/31/17 0507  CALCIUM 10.8* 10.9* 11.1*    CBC Recent Labs  Lab 08/29/17 0328 08/30/17 0402 08/31/17 0507  WBC 15.8* 16.1* 13.6*  HGB 9.6* 8.4* 8.3*  HCT 32.2* 28.6* 28.5*  PLT 402* 350 335    Coag's No results for input(s): APTT, INR in the last 168 hours.  Sepsis Markers Recent Labs  Lab 08/28/17 1824 08/29/17 1449 08/29/17 1450 08/29/17 1818 08/30/17 0402  LATICACIDVEN 0.72  --  1.1 1.0  --   PROCALCITON  --  0.33  --   --  0.96    ABG No results for input(s): PHART, PCO2ART, PO2ART in the last 168 hours.  Liver Enzymes No results for input(s): AST, ALT, ALKPHOS, BILITOT, ALBUMIN in the last 168 hours.  Cardiac Enzymes Recent Labs  Lab 08/28/17 2225 08/29/17 0328 08/29/17 0735  TROPONINI <0.03 <0.03 <0.03    Glucose Recent Labs  Lab 08/30/17 1213 08/30/17 1608 08/30/17 1741 08/30/17 2356  08/31/17 0806 08/31/17 1150  GLUCAP 99 125* 108* 104* 77 123*    Imaging Dg Chest Port 1 View  Result Date: 08/31/2017 CLINICAL DATA:  Hypoxia EXAM: PORTABLE CHEST 1 VIEW COMPARISON:  08/30/2017 FINDINGS: Elevation of the right hemidiaphragm. Mild cardiomegaly. Left perihilar and lower lobe airspace opacities again noted, unchanged. Right base atelectasis. No visible effusions. IMPRESSION: No significant change since prior study. Electronically Signed   By: Rolm Baptise M.D.   On: 08/31/2017 07:31     STUDIES:    CULTURES: Urine culture on admission is growing mixed flora which in her case with a chronic Foley may be real.  Single blood culture grew coag  negative staph.  ANTIBIOTICS: Vancomycin and cefepime  DISCUSSION: This is a 66 year old paraplegic with a chronic indwelling Foley who presented on 11/29 with transient chest pain followed by chills and chest congestion.  We were asked to see the patient for hypotension.  He is not toxic appearing at present, does not have a significant lactic acidosis, and reports to me that she has hypotension at baseline for which she takes a oral agent at home.  Restarted on Midodrine .  Clearly she does have URI symptoms as well as potential sources of sepsis including a decubitus ulcer and a chronic indwelling Foley.  She has no indication of gross hypoperfusion, mentating well . Viral panel is neg.   ASSESSMENT / PLAN:  PULMONARY A: Acute Bronchitis +/- early PNA .  Viral panel neg.   Plan  Cont IV abx. Narrow as able.  Cont mucinex and pulm hygiene .  Check cxr in am .   CARDIOVASCULAR A: b/p is slightly improved . MAP >60 .   Plan  Cont to monitor Cont on Midodrine .   RENAL Chronic foley  Renal fxn nml .   Anemia  No sign of active bleeding   Plan  Monitor    INFECTIOUS A: Acute Bronchitis /Early PNA - no overt sepsis noted.  LA/PCT ok   ENDOCRINE A:  SSI    Tammy Parrett NP-C  [facility]  [phone]

## 2017-08-31 NOTE — Progress Notes (Signed)
PROGRESS NOTE  Sharon Warner HBZ:[id number] DOB: 1951-09-19 DOA: 08/28/2017 PCP: Garwin Brothers, MD  HPI/Recap of past 24 hours: HPI from Dr Jennette Kettle on 08/28/17 Sharon Warner is a 66 y.o. female with medical history significant of paraplegia, neurogenic bladder, wheelchair-bound, chronic indwelling Foley catheter, sacral decubitus ulcer, morbid obesity, HTN, type II DM, sacral osteomyelitis, CHF presents to the ED, with c/o SOB, fevers, generalized weakness, chest congestion with mild productive cough for a couple of days, not improving. PTA, pt developed L sided chest pain, while husband was moving patient. CP lasted for a few mins and stopped after repositioning. CP does not radiate, and nothing made it better. Pt also noted cloudy urine recently and suspects she may have a UTI. Pt has a chronic stage 4 sacral decubitus, seeing wound care, but doesn't appear to be grossly infected. Of note, pt was admitted in 03/2017 for sepsis/bacteremia with sacroiliitis/UTI.  BCx that admission grew out proteus and group B strep of which she completed long duration course of Keflex and flagyl, off ABx since end of Aug. In the ED, pt was noted to be tachycardic, with mild leukocytosis. Admitted for sepsis likely due to UTI.  Interim history: On 11/30 evening, pt was noted to spike a temp, tachycardic and hypotensive, sepsis protocol was initiated. Pt was given about 2.5L of IVF due to severe hypotension, broad spectrum AB. Overnight on 11/30, pt desaturated, was requiring non-rebreather intermittenly. Consult to PCCM was initiated on 12/1 for persistent hypotension and respiratory distress of which she was later transferred to the ICU.  Today, pt reports feeling better, reports breathing is much improved. Denies any chest pain, abdominal pain, N/V/d/C, remained afebrile overnight.   Assessment/Plan: Principal Problem:   Catheter-associated urinary tract infection (HCC) Active Problems:   Paraplegia (HCC)   Neurogenic bladder   Hypercalcemia   Sacral decubitus ulcer, stage IV (HCC)   Hypotension  #Sepsis Febrile, leukocytosis, tachycardic, hypotension on presentation Likely due to CAUTI Vs CAP Vs sacral wound Patient is alert, awake, oriented, cooperative, no respiratory distress Lactic acid within normal limits, pro calcitonin 0.33 Chest x-ray negative, repeat showed congestion CT angio negative for PE but shows right upper lobe density could be a pneumonia and right lower lobe mucous plug UA showed large leukocytes, WBC TNTC, nitrite positive Blood culture on 1 bottle showed coagulase neg staph, likely contaminant Urine culture showed multiple species, rec recollection Status post 2.5 L of IV bolus Discontinue IV Rocephin and ceftriaxone Continue cefepime, start azithromycin Management by ICU team  #Acute hypoxic respiratory failure Likely due to sepsis/CAP Vs pulm congestion due to vol overload Repeat CXR showed pulm edema CT angio showed right upper lobe density could be a pneumonia and right lower lobe mucous plug Duonebs, Chest PT to help with mucous plug as pt is unable to cough up secretions  #Sacral decubitus ulcer stage IV No new drainage or obvious signs of infection from ulcer Due to sepsis we will cover with Vancomycin Wound care consult placed  #History of sacral osteomyelitis/bacteremia Diagnosed in 03/2017 with bacteremia Blood culture during that admission grew Proteus and group B strep, both sensitive to Rocephin Completed treatment with Keflex and Flagyl in 04/2017 If patient does not improve, consider MRI of the sacrum  #CHF Echo in 03/2017 showed LVEF 25-30% Monitor closely Hold home Toprol 12.5mg  due to hypotension  #Hypertension Currently hypotensive, ICU team started midodrine Hold home Toprol as above  #DM type II Last A1c in 03/2017, 5.0 Diet controlled SSI, Accu-Cheks,  hypoglycemic protocol  #Hypokalemia Replace as needed  #Paraplegia and  neurogenic bladder Replace Foley    Code Status: Full  Family Communication: None at bedside  Disposition Plan: Home once stable   Consultants:  PCCM  Procedures:  None  Antimicrobials:  IV Azithromycin  IV cefepime  DVT prophylaxis: Lovenox   Objective: Vitals:   08/31/17 1000 08/31/17 1100 08/31/17 1148 08/31/17 1200  BP: 93/75 (!) 93/57  (!) 79/52  Pulse:  87  82  Resp: (!) 33 (!) 22  (!) 25  Temp:   98.3 F (36.8 C)   TempSrc:   Oral   SpO2:  91%  100%  Weight:      Height:        Intake/Output Summary (Last 24 hours) at 08/31/2017 1431 Last data filed at 08/31/2017 1200 Gross per 24 hour  Intake 1060 ml  Output 2250 ml  Net -1190 ml   Filed Weights   08/28/17 1652 08/29/17 0427  Weight: 65.8 kg (145 lb) 61.1 kg (134 lb 12.8 oz)    Exam:   General: Alert, awake, oriented x3, no distress, chronically ill appearing  Cardiovascular: S1-S2 present, no added heart sounds  Respiratory: Bibasilar crackles noted  Abdomen: Soft, nontender, nondistended, bowel sounds present  Musculoskeletal: No bilateral pedal edema  Skin: Stage IV sacral decubitus ulcer  Psychiatry: Normal mood   Data Reviewed: CBC: Recent Labs  Lab 08/28/17 1654 08/29/17 0328 08/30/17 0402 08/31/17 0507  WBC 11.7* 15.8* 16.1* 13.6*  NEUTROABS  --   --  10.9* 9.0*  HGB 10.0* 9.6* 8.4* 8.3*  HCT 33.6* 32.2* 28.6* 28.5*  MCV 71.8* 71.6* 73.0* 72.9*  PLT 380 402* 350 633   Basic Metabolic Panel: Recent Labs  Lab 08/28/17 1654 08/29/17 0328 08/30/17 0402 08/31/17 0507  NA 133* 131* 133* 136  K 3.4* 3.5 3.5 3.2*  CL 102 102 105 105  CO2 21* 19* 22 23  GLUCOSE 84 141* 101* 92  BUN 24* 23* 25* 26*  CREATININE 0.74 0.67 0.63 0.60  CALCIUM 11.4* 10.8* 10.9* 11.1*   GFR: Estimated Creatinine Clearance: 59.6 mL/min (by C-G formula based on SCr of 0.6 mg/dL). Liver Function Tests: No results for input(s): AST, ALT, ALKPHOS, BILITOT, PROT, ALBUMIN in the last  168 hours. No results for input(s): LIPASE, AMYLASE in the last 168 hours. No results for input(s): AMMONIA in the last 168 hours. Coagulation Profile: No results for input(s): INR, PROTIME in the last 168 hours. Cardiac Enzymes: Recent Labs  Lab 08/28/17 2225 08/29/17 0328 08/29/17 0735  TROPONINI <0.03 <0.03 <0.03   BNP (last 3 results) No results for input(s): PROBNP in the last 8760 hours. HbA1C: No results for input(s): HGBA1C in the last 72 hours. CBG: Recent Labs  Lab 08/30/17 1608 08/30/17 1741 08/30/17 2356 08/31/17 0806 08/31/17 1150  GLUCAP 125* 108* 104* 77 123*   Lipid Profile: No results for input(s): CHOL, HDL, LDLCALC, TRIG, CHOLHDL, LDLDIRECT in the last 72 hours. Thyroid Function Tests: No results for input(s): TSH, T4TOTAL, FREET4, T3FREE, THYROIDAB in the last 72 hours. Anemia Panel: No results for input(s): VITAMINB12, FOLATE, FERRITIN, TIBC, IRON, RETICCTPCT in the last 72 hours. Urine analysis:    Component Value Date/Time   COLORURINE YELLOW 08/28/2017 1807   APPEARANCEUR TURBID (A) 08/28/2017 1807   LABSPEC 1.011 08/28/2017 1807   PHURINE 6.0 08/28/2017 1807   GLUCOSEU NEGATIVE 08/28/2017 1807   HGBUR MODERATE (A) 08/28/2017 1807   BILIRUBINUR NEGATIVE 08/28/2017 1807   KETONESUR 5 (  A) 08/28/2017 1807   PROTEINUR 100 (A) 08/28/2017 1807   NITRITE POSITIVE (A) 08/28/2017 1807   LEUKOCYTESUR LARGE (A) 08/28/2017 1807   Sepsis Labs: @LABRCNTIP (procalcitonin:4,lacticidven:4)  ) Recent Results (from the past 240 hour(s))  Culture, blood (routine x 2)     Status: Abnormal   Collection Time: 08/28/17  4:54 PM  Result Value Ref Range Status   Specimen Description BLOOD BLOOD LEFT FOREARM  Final   Special Requests   Final    Blood Culture adequate volume BOTTLES DRAWN AEROBIC AND ANAEROBIC   Culture  Setup Time   Final    GRAM POSITIVE COCCI IN CLUSTERS ANAEROBIC BOTTLE ONLY CRITICAL RESULT CALLED TO, READ BACK BY AND VERIFIED WITH: V BRYK  PHARMD 0149 08/30/17 A BROWNING    Culture (A)  Final    STAPHYLOCOCCUS SPECIES (COAGULASE NEGATIVE) THE SIGNIFICANCE OF ISOLATING THIS ORGANISM FROM A SINGLE SET OF BLOOD CULTURES WHEN MULTIPLE SETS ARE DRAWN IS UNCERTAIN. PLEASE NOTIFY THE MICROBIOLOGY DEPARTMENT WITHIN ONE WEEK IF SPECIATION AND SENSITIVITIES ARE REQUIRED.    Report Status 08/31/2017 FINAL  Final  Blood Culture ID Panel (Reflexed)     Status: Abnormal   Collection Time: 08/28/17  4:54 PM  Result Value Ref Range Status   Enterococcus species NOT DETECTED NOT DETECTED Final   Listeria monocytogenes NOT DETECTED NOT DETECTED Final   Staphylococcus species DETECTED (A) NOT DETECTED Final    Comment: Methicillin (oxacillin) susceptible coagulase negative staphylococcus. Possible blood culture contaminant (unless isolated from more than one blood culture draw or clinical case suggests pathogenicity). No antibiotic treatment is indicated for blood  culture contaminants. CRITICAL RESULT CALLED TO, READ BACK BY AND VERIFIED WITH: V BRYK PHARMD 0149 08/30/17 A BROWNING    Staphylococcus aureus NOT DETECTED NOT DETECTED Final   Methicillin resistance NOT DETECTED NOT DETECTED Final   Streptococcus species NOT DETECTED NOT DETECTED Final   Streptococcus agalactiae NOT DETECTED NOT DETECTED Final   Streptococcus pneumoniae NOT DETECTED NOT DETECTED Final   Streptococcus pyogenes NOT DETECTED NOT DETECTED Final   Acinetobacter baumannii NOT DETECTED NOT DETECTED Final   Enterobacteriaceae species NOT DETECTED NOT DETECTED Final   Enterobacter cloacae complex NOT DETECTED NOT DETECTED Final   Escherichia coli NOT DETECTED NOT DETECTED Final   Klebsiella oxytoca NOT DETECTED NOT DETECTED Final   Klebsiella pneumoniae NOT DETECTED NOT DETECTED Final   Proteus species NOT DETECTED NOT DETECTED Final   Serratia marcescens NOT DETECTED NOT DETECTED Final   Haemophilus influenzae NOT DETECTED NOT DETECTED Final   Neisseria meningitidis  NOT DETECTED NOT DETECTED Final   Pseudomonas aeruginosa NOT DETECTED NOT DETECTED Final   Candida albicans NOT DETECTED NOT DETECTED Final   Candida glabrata NOT DETECTED NOT DETECTED Final   Candida krusei NOT DETECTED NOT DETECTED Final   Candida parapsilosis NOT DETECTED NOT DETECTED Final   Candida tropicalis NOT DETECTED NOT DETECTED Final  Urine culture     Status: Abnormal   Collection Time: 08/28/17  6:14 PM  Result Value Ref Range Status   Specimen Description URINE, CATHETERIZED  Final   Special Requests NONE  Final   Culture MULTIPLE SPECIES PRESENT, SUGGEST RECOLLECTION (A)  Final   Report Status 08/29/2017 FINAL  Final  Culture, blood (routine x 2)     Status: None (Preliminary result)   Collection Time: 08/28/17  8:37 PM  Result Value Ref Range Status   Specimen Description BLOOD RIGHT ANTECUBITAL  Final   Special Requests   Final  Blood Culture adequate volume BOTTLES DRAWN AEROBIC AND ANAEROBIC   Culture NO GROWTH 3 DAYS  Final   Report Status PENDING  Incomplete  Culture, blood (routine x 2)     Status: None (Preliminary result)   Collection Time: 08/29/17  2:48 PM  Result Value Ref Range Status   Specimen Description BLOOD LEFT HAND  Final   Special Requests   Final    BOTTLES DRAWN AEROBIC AND ANAEROBIC Blood Culture adequate volume   Culture NO GROWTH 2 DAYS  Final   Report Status PENDING  Incomplete  Culture, blood (routine x 2)     Status: None (Preliminary result)   Collection Time: 08/29/17  2:56 PM  Result Value Ref Range Status   Specimen Description BLOOD RIGHT HAND  Final   Special Requests IN PEDIATRIC BOTTLE Blood Culture adequate volume  Final   Culture NO GROWTH 2 DAYS  Final   Report Status PENDING  Incomplete  Respiratory Panel by PCR     Status: None   Collection Time: 08/30/17  9:03 AM  Result Value Ref Range Status   Adenovirus NOT DETECTED NOT DETECTED Final   Coronavirus 229E NOT DETECTED NOT DETECTED Final   Coronavirus HKU1 NOT  DETECTED NOT DETECTED Final   Coronavirus NL63 NOT DETECTED NOT DETECTED Final   Coronavirus OC43 NOT DETECTED NOT DETECTED Final   Metapneumovirus NOT DETECTED NOT DETECTED Final   Rhinovirus / Enterovirus NOT DETECTED NOT DETECTED Final   Influenza A NOT DETECTED NOT DETECTED Final   Influenza B NOT DETECTED NOT DETECTED Final   Parainfluenza Virus 1 NOT DETECTED NOT DETECTED Final   Parainfluenza Virus 2 NOT DETECTED NOT DETECTED Final   Parainfluenza Virus 3 NOT DETECTED NOT DETECTED Final   Parainfluenza Virus 4 NOT DETECTED NOT DETECTED Final   Respiratory Syncytial Virus NOT DETECTED NOT DETECTED Final   Bordetella pertussis NOT DETECTED NOT DETECTED Final   Chlamydophila pneumoniae NOT DETECTED NOT DETECTED Final   Mycoplasma pneumoniae NOT DETECTED NOT DETECTED Final  MRSA PCR Screening     Status: None   Collection Time: 08/30/17  8:34 PM  Result Value Ref Range Status   MRSA by PCR NEGATIVE NEGATIVE Final    Comment:        The GeneXpert MRSA Assay (FDA approved for NASAL specimens only), is one component of a comprehensive MRSA colonization surveillance program. It is not intended to diagnose MRSA infection nor to guide or monitor treatment for MRSA infections.       Studies: Dg Chest Port 1 View  Result Date: 08/31/2017 CLINICAL DATA:  Hypoxia EXAM: PORTABLE CHEST 1 VIEW COMPARISON:  08/30/2017 FINDINGS: Elevation of the right hemidiaphragm. Mild cardiomegaly. Left perihilar and lower lobe airspace opacities again noted, unchanged. Right base atelectasis. No visible effusions. IMPRESSION: No significant change since prior study. Electronically Signed   By: Rolm Baptise M.D.   On: 08/31/2017 07:31    Scheduled Meds: . aspirin EC  81 mg Oral Daily  . cholecalciferol  1,000 Units Oral Daily  . dextromethorphan-guaiFENesin  1 tablet Oral BID  . enoxaparin (LOVENOX) injection  40 mg Subcutaneous Q24H  . feeding supplement (PRO-STAT SUGAR FREE 64)  30 mL Oral BID    . ferrous sulfate  325 mg Oral Daily  . fluticasone  1 spray Each Nare Daily  . insulin aspart  0-9 Units Subcutaneous TID WC  . lactobacillus acidophilus  1 tablet Oral BID  . levalbuterol  0.63 mg Nebulization Q6H  .  midodrine  2.5 mg Oral TID WC  . multivitamin with minerals  1 tablet Oral Daily  . protein supplement shake  2 oz Oral BID BM  . sodium hypochlorite   Topical QODAY  . zinc sulfate  220 mg Oral Daily    Continuous Infusions: . azithromycin Stopped (08/30/17 1914)  . ceFEPime (MAXIPIME) IV 1 g (08/31/17 1355)  . vancomycin Stopped (08/31/17 0600)     LOS: 3 days     Alma Friendly, MD [facility]   If 7PM-7AM, please contact night-coverage [email or web address] Password [facility] 08/31/2017, 2:31 PM

## 2017-08-31 NOTE — Progress Notes (Addendum)
0700 Bedside shift report, pt resting on venti mask, easy to arouse, NAD. Will continue to monitor.   0800 Pt assessed, see flowsheet, A&Ox4, updated with POC today and attempting to wean O2. Pt understands without assistance. BP low, on midodrine, pt has low grade temp, will address with MD on rounds today. Pt turned and repositioned, speech therapy in to see pt. WCTM.   1030 Pt medicated, see MAR, tylenol given for low grade fever. Family now at bedside, updated with POC, no complaints at this time. WCTM.   1200 Pt visiting with family, NAD, attempting to wean O2.   1430 RN to room, pt desaturating to 86%. Educated pt about deep breathing, O2 now 5L, 95% O2 sat., pt denies SOB, WCTM.  1700 Wound care performed to coccyx, pt tolerated well, O2 weaned to 3L. No complaints at this time. WCTM.  1845 Pt resting comfortably, awaiting new dinner tray. No complaints, updated with POC and stepdown bed. Pt understands without assistance. Awaiting new RN for shift report.

## 2017-09-01 ENCOUNTER — Inpatient Hospital Stay (HOSPITAL_COMMUNITY): Payer: BLUE CROSS/BLUE SHIELD

## 2017-09-01 LAB — GLUCOSE, CAPILLARY
GLUCOSE-CAPILLARY: 111 mg/dL — AB (ref 65–99)
Glucose-Capillary: 84 mg/dL (ref 65–99)
Glucose-Capillary: 91 mg/dL (ref 65–99)
Glucose-Capillary: 101 mg/dL — ABNORMAL HIGH (ref 65–99)
Glucose-Capillary: 93 mg/dL (ref 65–99)

## 2017-09-01 LAB — CBC WITH DIFFERENTIAL/PLATELET
BASOS ABS: 0 10*3/uL (ref 0.0–0.1)
Basophils Relative: 0 %
EOS PCT: 1 %
Eosinophils Absolute: 0.1 10*3/uL (ref 0.0–0.7)
HEMATOCRIT: 27.2 % — AB (ref 36.0–46.0)
Hemoglobin: 8 g/dL — ABNORMAL LOW (ref 12.0–15.0)
LYMPHS ABS: 2.9 10*3/uL (ref 0.7–4.0)
Lymphocytes Relative: 32 %
MCH: 21.2 pg — ABNORMAL LOW (ref 26.0–34.0)
MCHC: 29.4 g/dL — AB (ref 30.0–36.0)
MCV: 72.1 fL — AB (ref 78.0–100.0)
MONO ABS: 0.6 10*3/uL (ref 0.1–1.0)
MONOS PCT: 7 %
NEUTROS ABS: 5.5 10*3/uL (ref 1.7–7.7)
Neutrophils Relative %: 60 %
PLATELETS: 317 10*3/uL (ref 150–400)
RBC: 3.77 MIL/uL — AB (ref 3.87–5.11)
RDW: 18 % — AB (ref 11.5–15.5)
WBC: 9.1 10*3/uL (ref 4.0–10.5)

## 2017-09-01 LAB — BASIC METABOLIC PANEL
ANION GAP: 8 (ref 5–15)
BUN: 28 mg/dL — AB (ref 6–20)
CO2: 22 mmol/L (ref 22–32)
CREATININE: 0.56 mg/dL (ref 0.44–1.00)
Calcium: 11.2 mg/dL — ABNORMAL HIGH (ref 8.9–10.3)
Chloride: 105 mmol/L (ref 101–111)
GFR calc Af Amer: >60 mL/min (ref >60)
GLUCOSE: 91 mg/dL (ref 65–99)
POTASSIUM: 3.7 mmol/L (ref 3.5–5.1)
SODIUM: 135 mmol/L (ref 135–145)

## 2017-09-01 MED ORDER — MIDODRINE HCL 5 MG PO TABS
2.5000 mg | ORAL_TABLET | Freq: Three times a day (TID) | ORAL | Status: DC
  Administered 2017-09-01 – 2017-09-03 (×7): 2.5 mg via ORAL
  Filled 2017-09-01 (×7): qty 1

## 2017-09-01 MED ORDER — RESOURCE THICKENUP CLEAR PO POWD
ORAL | Status: DC | PRN
  Filled 2017-09-01: qty 125

## 2017-09-01 NOTE — Consult Note (Addendum)
Consult requested for sacrum wound.  WOC performed the consult already on 11/30.  Please refer to progress notes for assessment and measurements.  Topical treatment orders have been provided for staff nurses to perform. If sepsis is a concern, please consider CT or MRI to R/O osteomyelitis. Please re-consult if further assistance is needed.  Thank-you,  Julien Girt MSN, [place], [place], [place], [place]

## 2017-09-01 NOTE — Progress Notes (Signed)
Modified Barium Swallow Progress Note  Patient Details  Name: Jesusita Jocelyn MRN: [id number] Date of Birth: 1950/11/02  Today's Date: 09/01/2017  Modified Barium Swallow completed.  Full report located under Chart Review in the Imaging Section.  Brief recommendations include the following:  Clinical Impression  Pt has a mild-moderate oropharyngeal dysphagia with oral phase functional with liquids but mildly prolonged with solids, particularly with mastication. She has reduced anterior hyolaryngeal movement, base of tongue retraction, and timing for airway closure, which results in consistent penetration of thin liquids. Smaller bolus sizes and attempted chin tuck does not increase airway protection - in fact, a chin tuck results in deeper penetration to the true vocal folds. She does not elicit a spontaneous cough reflex until thin liquid reaches below the true vocal folds, and her cough is so weak that it does not appear to move the aspirates. Cued cough/throat clear with more shallow penetration is still too weak to clear her airway. Moderate residue remains after the swallow with solids, which is reduced with a second reflexive but delayed swallow. Given her acute issues including decreased respiratory status, recommend Dys 3 diet and nectar thick liquids with fair potential for advancement as her overall strength and strength of cough improve.    Swallow Evaluation Recommendations       SLP Diet Recommendations: Dysphagia 3 (Mech soft) solids;Nectar thick liquid   Liquid Administration via: Cup;Straw   Medication Administration: Whole meds with puree   Supervision: Staff to assist with self feeding;Full supervision/cueing for compensatory strategies   Compensations: Slow rate;Small sips/bites   Postural Changes: Seated upright at 90 degrees   Oral Care Recommendations: Oral care BID   Other Recommendations: Order thickener from pharmacy;Prohibited food (jello, ice cream, thin  soups);Remove water pitcher    Germain Osgood 09/01/2017,11:58 AM   Germain Osgood, M.A. CCC-SLP [phone]

## 2017-09-01 NOTE — Progress Notes (Signed)
PROGRESS NOTE  Sharon Warner YKD:[id number] DOB: 1951-03-31 DOA: 08/28/2017 PCP: Garwin Brothers, MD  HPI/Recap of past 24 hours: HPI from Dr Jennette Kettle on 08/28/17 Sharon Warner is a 66 y.o. female with medical history significant of paraplegia, neurogenic bladder, wheelchair-bound, chronic indwelling Foley catheter, sacral decubitus ulcer, morbid obesity, HTN, type II DM, sacral osteomyelitis, CHF presents to the ED, with c/o SOB, fevers, generalized weakness, chest congestion with mild productive cough for a couple of days, not improving. PTA, pt developed L sided chest pain, while husband was moving patient. CP lasted for a few mins and stopped after repositioning. CP does not radiate, and nothing made it better. Pt also noted cloudy urine recently and suspects she may have a UTI. Pt has a chronic stage 4 sacral decubitus, seeing wound care, but doesn't appear to be grossly infected. Of note, pt was admitted in 03/2017 for sepsis/bacteremia with sacroiliitis/UTI.  BCx that admission grew out proteus and group B strep of which she completed long duration course of Keflex and flagyl, off ABx since end of Aug. In the ED, pt was noted to be tachycardic, with mild leukocytosis. Admitted for sepsis likely due to UTI.  Interim history: On 11/30 evening, pt was noted to spike a temp, tachycardic and hypotensive, sepsis protocol was initiated. Pt was given about 2.5L of IVF due to severe hypotension, broad spectrum AB. Overnight on 11/30, pt desaturated, was requiring non-rebreather intermittenly. Consult to PCCM was initiated on 12/1 for persistent hypotension and respiratory distress of which she was later transferred to the ICU.  Today, pt reports feeling better, reports breathing is much improved. Denies any chest pain, abdominal pain, N/V/d/C, remained afebrile overnight. ICU team signed off. Patient stable to d/c to SDU   Assessment/Plan: Principal Problem:   Catheter-associated urinary tract  infection ([place]) Active Problems:   Paraplegia (HCC)   Neurogenic bladder   Hypercalcemia   Sacral decubitus ulcer, stage IV (HCC)   Hypotension   Hypoxia  #Sepsis Improving Febrile, leukocytosis, tachycardic, hypotension on presentation Likely due to CAUTI Vs CAP Vs sacral wound Patient is alert, awake, oriented, cooperative, no respiratory distress Lactic acid within normal limits, pro calcitonin 0.33 Chest x-ray negative, repeat showed congestion CT angio negative for PE but shows right upper lobe density could be a pneumonia and right lower lobe mucous plug UA showed large leukocytes, WBC TNTC, nitrite positive Blood culture on 1 bottle showed coagulase neg staph, likely contaminant Repeat BC on 11/29, 11/30 showed NGTD Urine culture showed multiple species, likely colonization due to chronic foley Status post 2.5 L of IV bolus Discontinue IV Rocephin and ceftriaxone Continue cefepime, start azithromycin ICU team signed off, transferred to SDU  #Acute hypoxic respiratory failure Improving Likely due to sepsis/CAP Vs pulm congestion due to vol overload vs aspiration PNA Repeat CXR showed pulm edema CT angio showed right upper lobe density could be a pneumonia and right lower lobe mucous plug Duonebs, Chest PT to help with mucous plug as pt is unable to cough up secretions Swallow eval done: Mild-mod dysphagia, rec dysphagia 3, mech soft + nectar thick liquids  #Sacral decubitus ulcer stage IV No new drainage or obvious signs of infection from ulcer Wound care consult placed  #History of sacral osteomyelitis/bacteremia Diagnosed in 03/2017 with bacteremia Blood culture during that admission grew Proteus and group B strep, both sensitive to Rocephin Completed treatment with Keflex and Flagyl in 04/2017 If patient does not improve, consider MRI of the sacrum  #CHF Echo in  03/2017 showed LVEF 25-30% Monitor closely Hold home Toprol 12.5mg  due to  hypotension  #Hypertension Currently hypotensive, ICU team started midodrine Hold home Toprol as above  #DM type II Last A1c in 03/2017, 5.0 Diet controlled SSI, Accu-Cheks, hypoglycemic protocol  #Hypokalemia Replace as needed  #Paraplegia and neurogenic bladder Replace Foley    Code Status: Full  Family Communication: None at bedside  Disposition Plan: Home once stable   Consultants:  PCCM  Procedures:  None  Antimicrobials:  IV Azithromycin  IV cefepime  DVT prophylaxis: Lovenox   Objective: Vitals:   09/01/17 1426 09/01/17 1500 09/01/17 1551 09/01/17 1600  BP:  (!) 98/56  93/66  Pulse:  87  80  Resp:  (!) 22  (!) 28  Temp:   97.7 F (36.5 C)   TempSrc:   Oral   SpO2: 100% 98%  100%  Weight:      Height:        Intake/Output Summary (Last 24 hours) at 09/01/2017 1643 Last data filed at 09/01/2017 0600 Gross per 24 hour  Intake 910 ml  Output 910 ml  Net 0 ml   Filed Weights   08/28/17 1652 08/29/17 0427  Weight: 65.8 kg (145 lb) 61.1 kg (134 lb 12.8 oz)    Exam:   General: Alert, awake, oriented x3, no distress, chronically ill appearing  Cardiovascular: S1-S2 present, no added heart sounds  Respiratory: Bibasilar crackles noted  Abdomen: Soft, nontender, nondistended, bowel sounds present  Musculoskeletal: No bilateral pedal edema  Skin: Stage IV sacral decubitus ulcer  Psychiatry: Normal mood   Data Reviewed: CBC: Recent Labs  Lab 08/28/17 1654 08/29/17 0328 08/30/17 0402 08/31/17 0507 09/01/17 0306  WBC 11.7* 15.8* 16.1* 13.6* 9.1  NEUTROABS  --   --  10.9* 9.0* 5.5  HGB 10.0* 9.6* 8.4* 8.3* 8.0*  HCT 33.6* 32.2* 28.6* 28.5* 27.2*  MCV 71.8* 71.6* 73.0* 72.9* 72.1*  PLT 380 402* 350 335 161   Basic Metabolic Panel: Recent Labs  Lab 08/28/17 1654 08/29/17 0328 08/30/17 0402 08/31/17 0507 09/01/17 0306  NA 133* 131* 133* 136 135  K 3.4* 3.5 3.5 3.2* 3.7  CL 102 102 105 105 105  CO2 21* 19* 22 23 22    GLUCOSE 84 141* 101* 92 91  BUN 24* 23* 25* 26* 28*  CREATININE 0.74 0.67 0.63 0.60 0.56  CALCIUM 11.4* 10.8* 10.9* 11.1* 11.2*   GFR: Estimated Creatinine Clearance: 59.6 mL/min (by C-G formula based on SCr of 0.56 mg/dL). Liver Function Tests: No results for input(s): AST, ALT, ALKPHOS, BILITOT, PROT, ALBUMIN in the last 168 hours. No results for input(s): LIPASE, AMYLASE in the last 168 hours. No results for input(s): AMMONIA in the last 168 hours. Coagulation Profile: No results for input(s): INR, PROTIME in the last 168 hours. Cardiac Enzymes: Recent Labs  Lab 08/28/17 2225 08/29/17 0328 08/29/17 0735  TROPONINI <0.03 <0.03 <0.03   BNP (last 3 results) No results for input(s): PROBNP in the last 8760 hours. HbA1C: No results for input(s): HGBA1C in the last 72 hours. CBG: Recent Labs  Lab 08/31/17 1150 08/31/17 1615 08/31/17 2153 09/01/17 0744 09/01/17 1134  GLUCAP 123* 85 90 84 91   Lipid Profile: No results for input(s): CHOL, HDL, LDLCALC, TRIG, CHOLHDL, LDLDIRECT in the last 72 hours. Thyroid Function Tests: No results for input(s): TSH, T4TOTAL, FREET4, T3FREE, THYROIDAB in the last 72 hours. Anemia Panel: No results for input(s): VITAMINB12, FOLATE, FERRITIN, TIBC, IRON, RETICCTPCT in the last 72 hours. Urine  analysis:    Component Value Date/Time   COLORURINE YELLOW 08/28/2017 1807   APPEARANCEUR TURBID (A) 08/28/2017 1807   LABSPEC 1.011 08/28/2017 1807   PHURINE 6.0 08/28/2017 1807   GLUCOSEU NEGATIVE 08/28/2017 1807   HGBUR MODERATE (A) 08/28/2017 1807   BILIRUBINUR NEGATIVE 08/28/2017 1807   KETONESUR 5 (A) 08/28/2017 1807   PROTEINUR 100 (A) 08/28/2017 1807   NITRITE POSITIVE (A) 08/28/2017 1807   LEUKOCYTESUR LARGE (A) 08/28/2017 1807   Sepsis Labs: @LABRCNTIP (procalcitonin:4,lacticidven:4)  ) Recent Results (from the past 240 hour(s))  Culture, blood (routine x 2)     Status: Abnormal   Collection Time: 08/28/17  4:54 PM  Result  Value Ref Range Status   Specimen Description BLOOD BLOOD LEFT FOREARM  Final   Special Requests   Final    Blood Culture adequate volume BOTTLES DRAWN AEROBIC AND ANAEROBIC   Culture  Setup Time   Final    GRAM POSITIVE COCCI IN CLUSTERS ANAEROBIC BOTTLE ONLY CRITICAL RESULT CALLED TO, READ BACK BY AND VERIFIED WITH: V BRYK PHARMD 0149 08/30/17 A BROWNING    Culture (A)  Final    STAPHYLOCOCCUS SPECIES (COAGULASE NEGATIVE) THE SIGNIFICANCE OF ISOLATING THIS ORGANISM FROM A SINGLE SET OF BLOOD CULTURES WHEN MULTIPLE SETS ARE DRAWN IS UNCERTAIN. PLEASE NOTIFY THE MICROBIOLOGY DEPARTMENT WITHIN ONE WEEK IF SPECIATION AND SENSITIVITIES ARE REQUIRED.    Report Status 08/31/2017 FINAL  Final  Blood Culture ID Panel (Reflexed)     Status: Abnormal   Collection Time: 08/28/17  4:54 PM  Result Value Ref Range Status   Enterococcus species NOT DETECTED NOT DETECTED Final   Listeria monocytogenes NOT DETECTED NOT DETECTED Final   Staphylococcus species DETECTED (A) NOT DETECTED Final    Comment: Methicillin (oxacillin) susceptible coagulase negative staphylococcus. Possible blood culture contaminant (unless isolated from more than one blood culture draw or clinical case suggests pathogenicity). No antibiotic treatment is indicated for blood  culture contaminants. CRITICAL RESULT CALLED TO, READ BACK BY AND VERIFIED WITH: V BRYK PHARMD 0149 08/30/17 A BROWNING    Staphylococcus aureus NOT DETECTED NOT DETECTED Final   Methicillin resistance NOT DETECTED NOT DETECTED Final   Streptococcus species NOT DETECTED NOT DETECTED Final   Streptococcus agalactiae NOT DETECTED NOT DETECTED Final   Streptococcus pneumoniae NOT DETECTED NOT DETECTED Final   Streptococcus pyogenes NOT DETECTED NOT DETECTED Final   Acinetobacter baumannii NOT DETECTED NOT DETECTED Final   Enterobacteriaceae species NOT DETECTED NOT DETECTED Final   Enterobacter cloacae complex NOT DETECTED NOT DETECTED Final   Escherichia coli  NOT DETECTED NOT DETECTED Final   Klebsiella oxytoca NOT DETECTED NOT DETECTED Final   Klebsiella pneumoniae NOT DETECTED NOT DETECTED Final   Proteus species NOT DETECTED NOT DETECTED Final   Serratia marcescens NOT DETECTED NOT DETECTED Final   Haemophilus influenzae NOT DETECTED NOT DETECTED Final   Neisseria meningitidis NOT DETECTED NOT DETECTED Final   Pseudomonas aeruginosa NOT DETECTED NOT DETECTED Final   Candida albicans NOT DETECTED NOT DETECTED Final   Candida glabrata NOT DETECTED NOT DETECTED Final   Candida krusei NOT DETECTED NOT DETECTED Final   Candida parapsilosis NOT DETECTED NOT DETECTED Final   Candida tropicalis NOT DETECTED NOT DETECTED Final  Urine culture     Status: Abnormal   Collection Time: 08/28/17  6:14 PM  Result Value Ref Range Status   Specimen Description URINE, CATHETERIZED  Final   Special Requests NONE  Final   Culture MULTIPLE SPECIES PRESENT, SUGGEST RECOLLECTION (A)  Final   Report Status 08/29/2017 FINAL  Final  Culture, blood (routine x 2)     Status: None (Preliminary result)   Collection Time: 08/28/17  8:37 PM  Result Value Ref Range Status   Specimen Description BLOOD RIGHT ANTECUBITAL  Final   Special Requests   Final    Blood Culture adequate volume BOTTLES DRAWN AEROBIC AND ANAEROBIC   Culture NO GROWTH 4 DAYS  Final   Report Status PENDING  Incomplete  Culture, blood (routine x 2)     Status: None (Preliminary result)   Collection Time: 08/29/17  2:48 PM  Result Value Ref Range Status   Specimen Description BLOOD LEFT HAND  Final   Special Requests   Final    BOTTLES DRAWN AEROBIC AND ANAEROBIC Blood Culture adequate volume   Culture NO GROWTH 3 DAYS  Final   Report Status PENDING  Incomplete  Culture, blood (routine x 2)     Status: None (Preliminary result)   Collection Time: 08/29/17  2:56 PM  Result Value Ref Range Status   Specimen Description BLOOD RIGHT HAND  Final   Special Requests IN PEDIATRIC BOTTLE Blood Culture  adequate volume  Final   Culture NO GROWTH 3 DAYS  Final   Report Status PENDING  Incomplete  Respiratory Panel by PCR     Status: None   Collection Time: 08/30/17  9:03 AM  Result Value Ref Range Status   Adenovirus NOT DETECTED NOT DETECTED Final   Coronavirus 229E NOT DETECTED NOT DETECTED Final   Coronavirus HKU1 NOT DETECTED NOT DETECTED Final   Coronavirus NL63 NOT DETECTED NOT DETECTED Final   Coronavirus OC43 NOT DETECTED NOT DETECTED Final   Metapneumovirus NOT DETECTED NOT DETECTED Final   Rhinovirus / Enterovirus NOT DETECTED NOT DETECTED Final   Influenza A NOT DETECTED NOT DETECTED Final   Influenza B NOT DETECTED NOT DETECTED Final   Parainfluenza Virus 1 NOT DETECTED NOT DETECTED Final   Parainfluenza Virus 2 NOT DETECTED NOT DETECTED Final   Parainfluenza Virus 3 NOT DETECTED NOT DETECTED Final   Parainfluenza Virus 4 NOT DETECTED NOT DETECTED Final   Respiratory Syncytial Virus NOT DETECTED NOT DETECTED Final   Bordetella pertussis NOT DETECTED NOT DETECTED Final   Chlamydophila pneumoniae NOT DETECTED NOT DETECTED Final   Mycoplasma pneumoniae NOT DETECTED NOT DETECTED Final  MRSA PCR Screening     Status: None   Collection Time: 08/30/17  8:34 PM  Result Value Ref Range Status   MRSA by PCR NEGATIVE NEGATIVE Final    Comment:        The GeneXpert MRSA Assay (FDA approved for NASAL specimens only), is one component of a comprehensive MRSA colonization surveillance program. It is not intended to diagnose MRSA infection nor to guide or monitor treatment for MRSA infections.       Studies: Dg Chest Port 1 View  Result Date: 09/01/2017 CLINICAL DATA:  Pneumonia. EXAM: PORTABLE CHEST 1 VIEW COMPARISON:  08/31/2017. FINDINGS: Cardiomegaly. Overlying telemetry leads, but no support tubes or lines. Chronic RIGHT hemidiaphragm elevation. Improved retrocardiac density, possible clearing atelectasis or infiltrate. Stable to minimally increased RIGHT perihilar  opacity. No effusion or pneumothorax. Bones unremarkable. IMPRESSION: Slight improvement aeration, particularly at the LEFT base. Stable to minimally increased RIGHT perihilar opacity, continued surveillance warranted. Electronically Signed   By: Staci Righter M.D.   On: 09/01/2017 07:15   Dg Swallowing Func-speech Pathology  Result Date: 09/01/2017 Objective Swallowing Evaluation: Type of Study: MBS-Modified Barium Swallow Study  Patient Details Name: Sharon Warner MRN: [id number] Date of Birth: 1951/07/25 Today's Date: 09/01/2017 Time: SLP Start Time (ACUTE ONLY): 1016 -SLP Stop Time (ACUTE ONLY): 1031 SLP Time Calculation (min) (ACUTE ONLY): 15 min Past Medical History: Past Medical History: Diagnosis Date . Acute on chronic systolic CHF (congestive heart failure) ([place])  . Chronic heart failure ([place])  . Chronic indwelling Foley catheter   last changed few days ago . Diabetes mellitus without complication ([place])  . Enlarged thoracic aorta ([place]) 03/14/2016 . H/O paraplegia   ascending paralysis unclear etiology . Hypertension  . Lumbar spondylosis  . Morbid obesity ([place])  . OSA on CPAP  . Sacral decubitus ulcer, stage IV [facility])  Past Surgical History: Past Surgical History: Procedure Laterality Date . JOINT REPLACEMENT Right   Hip . TONSILLECTOMY   . VAGINAL HYSTERECTOMY   HPI: Pt is a 66 year old paraplegic with a chronic indwelling Foley who presented on 11/29 with transient chest pain followed by chills and chest congestion. She was admitted for possible sepsis from UTI versus sacral wound, although there is also concern for URI or early PNA. On 11/30, pt also desaturated, requiring intermittent NRB and transfer to ICU. PMH is also significant forparaplegia, neurogenic bladder, wheelchair-bound, chronic indwelling Foley catheter, sacral decubitus ulcer, morbid obesity, HTN, type II DM, sacral osteomyelitis, chronicCHF, OSA.  Subjective: pt denies much coughing during PO intake Assessment / Plan / Recommendation  CHL IP CLINICAL IMPRESSIONS 09/01/2017 Clinical Impression Pt has a mild-moderate oropharyngeal dysphagia with oral phase functional with liquids but mildly prolonged with solids, particularly with mastication. She has reduced anterior hyolaryngeal movement, base of tongue retraction, and timing for airway closure, which results in consistent penetration of thin liquids. Smaller bolus sizes and attempted chin tuck does not increase airway protection - in fact, a chin tuck results in deeper penetration to the true vocal folds. She does not elicit a spontaneous cough reflex until thin liquid reaches below the true vocal folds, and her cough is so weak that it does not appear to move the aspirates. Cued cough/throat clear with more shallow penetration is still too weak to clear her airway. Moderate residue remains after the swallow with solids, which is reduced with a second reflexive but delayed swallow. Given her acute issues including decreased respiratory status, recommend Dys 3 diet and nectar thick liquids with fair potential for advancement as her overall strength and strength of cough improve.  SLP Visit Diagnosis Dysphagia, oropharyngeal phase (R13.12) Attention and concentration deficit following -- Frontal lobe and executive function deficit following -- Impact on safety and function Mild aspiration risk   CHL IP TREATMENT RECOMMENDATION 09/01/2017 Treatment Recommendations Therapy as outlined in treatment plan below   Prognosis 09/01/2017 Prognosis for Safe Diet Advancement Fair Barriers to Reach Goals -- Barriers/Prognosis Comment -- CHL IP DIET RECOMMENDATION 09/01/2017 SLP Diet Recommendations Dysphagia 3 (Mech soft) solids;Nectar thick liquid Liquid Administration via Cup;Straw Medication Administration Whole meds with puree Compensations Slow rate;Small sips/bites Postural Changes Seated upright at 90 degrees   CHL IP OTHER RECOMMENDATIONS 09/01/2017 Recommended Consults -- Oral Care Recommendations Oral  care BID Other Recommendations Order thickener from pharmacy;Prohibited food (jello, ice cream, thin soups);Remove water pitcher   CHL IP FOLLOW UP RECOMMENDATIONS 09/01/2017 Follow up Recommendations (No Data)   CHL IP FREQUENCY AND DURATION 09/01/2017 Speech Therapy Frequency (ACUTE ONLY) min 2x/week Treatment Duration 2 weeks      CHL IP ORAL PHASE 09/01/2017 Oral Phase Impaired Oral - Pudding Teaspoon -- Oral - Pudding Cup --  Oral - Honey Teaspoon -- Oral - Honey Cup -- Oral - Nectar Teaspoon -- Oral - Nectar Cup -- Oral - Nectar Straw WFL Oral - Thin Teaspoon -- Oral - Thin Cup WFL Oral - Thin Straw WFL Oral - Puree WFL Oral - Mech Soft Impaired mastication Oral - Regular -- Oral - Multi-Consistency -- Oral - Pill -- Oral Phase - Comment --  CHL IP PHARYNGEAL PHASE 09/01/2017 Pharyngeal Phase Impaired Pharyngeal- Pudding Teaspoon -- Pharyngeal -- Pharyngeal- Pudding Cup -- Pharyngeal -- Pharyngeal- Honey Teaspoon -- Pharyngeal -- Pharyngeal- Honey Cup -- Pharyngeal -- Pharyngeal- Nectar Teaspoon -- Pharyngeal -- Pharyngeal- Nectar Cup -- Pharyngeal -- Pharyngeal- Nectar Straw Reduced tongue base retraction;Reduced anterior laryngeal mobility;Reduced airway/laryngeal closure;Pharyngeal residue - valleculae Pharyngeal -- Pharyngeal- Thin Teaspoon -- Pharyngeal -- Pharyngeal- Thin Cup Reduced tongue base retraction;Reduced anterior laryngeal mobility;Reduced airway/laryngeal closure;Pharyngeal residue - valleculae;Penetration/Aspiration during swallow Pharyngeal Material enters airway, CONTACTS cords and not ejected out Pharyngeal- Thin Straw Reduced tongue base retraction;Reduced anterior laryngeal mobility;Reduced airway/laryngeal closure;Pharyngeal residue - valleculae;Penetration/Aspiration during swallow Pharyngeal Material enters airway, passes BELOW cords and not ejected out despite cough attempt by patient Pharyngeal- Puree Reduced tongue base retraction;Reduced anterior laryngeal mobility;Reduced  airway/laryngeal closure;Pharyngeal residue - valleculae Pharyngeal -- Pharyngeal- Mechanical Soft Reduced tongue base retraction;Reduced anterior laryngeal mobility;Reduced airway/laryngeal closure;Pharyngeal residue - valleculae Pharyngeal -- Pharyngeal- Regular -- Pharyngeal -- Pharyngeal- Multi-consistency -- Pharyngeal -- Pharyngeal- Pill -- Pharyngeal -- Pharyngeal Comment --  CHL IP CERVICAL ESOPHAGEAL PHASE 09/01/2017 Cervical Esophageal Phase WFL Pudding Teaspoon -- Pudding Cup -- Honey Teaspoon -- Honey Cup -- Nectar Teaspoon -- Nectar Cup -- Nectar Straw -- Thin Teaspoon -- Thin Cup -- Thin Straw -- Puree -- Mechanical Soft -- Regular -- Multi-consistency -- Pill -- Cervical Esophageal Comment -- No flowsheet data found. Germain Osgood 09/01/2017, 12:03 PM  Germain Osgood, M.A. CCC-SLP [phone]              Scheduled Meds: . aspirin EC  81 mg Oral Daily  . cholecalciferol  1,000 Units Oral Daily  . dextromethorphan-guaiFENesin  1 tablet Oral BID  . enoxaparin (LOVENOX) injection  40 mg Subcutaneous Q24H  . feeding supplement (PRO-STAT SUGAR FREE 64)  30 mL Oral BID  . ferrous sulfate  325 mg Oral Daily  . fluticasone  1 spray Each Nare Daily  . insulin aspart  0-9 Units Subcutaneous TID WC  . lactobacillus acidophilus  1 tablet Oral BID  . levalbuterol  0.63 mg Nebulization Q6H  . midodrine  2.5 mg Oral TID  . multivitamin with minerals  1 tablet Oral Daily  . protein supplement shake  2 oz Oral BID BM  . sodium chloride HYPERTONIC  4 mL Nebulization Daily  . sodium hypochlorite   Topical QODAY  . zinc sulfate  220 mg Oral Daily    Continuous Infusions: . azithromycin 500 mg (09/01/17 1635)  . ceFEPime (MAXIPIME) IV Stopped (09/01/17 1450)  . vancomycin Stopped (09/01/17 0530)     LOS: 4 days     Alma Friendly, MD [facility]   If 7PM-7AM, please contact night-coverage [email or web address] Password [facility] 09/01/2017, 4:43 PM

## 2017-09-01 NOTE — Progress Notes (Signed)
Pharmacy Antibiotic Note  Sharon Warner is a 66 y.o. female admitted on 08/28/2017 with sepsis.  Pharmacy managing cefepime and vancomycin dosing.WBC down to normal limits. Afebrile. SCr remains stable and wnl. Culture negative so far.   Plan: Continue cefepime 1g IV Q8h Continue vancomycin 500mg  IV Q12h. Consider stopping today  Monitor clinical picture, renal function, VT prn F/U C&S, abx deescalation / LOT Will hold off for one more day before checking trough    Height: 5\' 1"  (154.9 cm) Weight: 134 lb 12.8 oz (61.1 kg) IBW/kg (Calculated) : 47.8  Temp (24hrs), Avg:97.9 F (36.6 C), Min:97.4 F (36.3 C), Max:98.1 F (36.7 C)  Recent Labs  Lab 08/28/17 1654 08/28/17 1824 08/29/17 0328 08/29/17 1450 08/29/17 1818 08/30/17 0402 08/31/17 0507 09/01/17 0306  WBC 11.7*  --  15.8*  --   --  16.1* 13.6* 9.1  CREATININE 0.74  --  0.67  --   --  0.63 0.60 0.56  LATICACIDVEN  --  0.72  --  1.1 1.0  --   --   --     Estimated Creatinine Clearance: 59.6 mL/min (by C-G formula based on SCr of 0.56 mg/dL).    Allergies  Allergen Reactions  . Influenza Vaccines Other (See Comments)    Patient lost feeling in her legs and that spread  . Levaquin [Levofloxacin In D5w] Nausea Only  . Penicillins Other (See Comments)    From childhood: Has patient had a PCN reaction causing immediate rash, facial/tongue/throat swelling, SOB or lightheadedness with hypotension: Unknown Has patient had a PCN reaction causing severe rash involving mucus membranes or skin necrosis: Unknown Has patient had a PCN reaction that required hospitalization: No Has patient had a PCN reaction occurring within the last 10 years: No If all of the above answers are "NO", then may proceed with Cephalosporin use.  . Simvastatin Other (See Comments)    Reaction not recalled; PATIENT STATED THAT SHE "WILL NOT TAKE"  . Statins Other (See Comments)    PATIENT STATED THAT SHE "WILL NOT TAKE" THESE    Thank you for  allowing pharmacy to be a part of this patient's care.  Albertina Parr, PharmD., BCPS Clinical Pharmacist Pager [phone]

## 2017-09-02 LAB — CBC WITH DIFFERENTIAL/PLATELET
BASOS ABS: 0 10*3/uL (ref 0.0–0.1)
Basophils Relative: 0 %
Eosinophils Absolute: 0.1 10*3/uL (ref 0.0–0.7)
Eosinophils Relative: 1 %
HEMATOCRIT: 31.2 % — AB (ref 36.0–46.0)
HEMOGLOBIN: 9 g/dL — AB (ref 12.0–15.0)
LYMPHS PCT: 36 %
Lymphs Abs: 3.5 10*3/uL (ref 0.7–4.0)
MCH: 20.9 pg — ABNORMAL LOW (ref 26.0–34.0)
MCHC: 28.8 g/dL — ABNORMAL LOW (ref 30.0–36.0)
MCV: 72.4 fL — ABNORMAL LOW (ref 78.0–100.0)
MONOS PCT: 8 %
Monocytes Absolute: 0.8 10*3/uL (ref 0.1–1.0)
Neutro Abs: 5.2 10*3/uL (ref 1.7–7.7)
Neutrophils Relative %: 55 %
Platelets: 371 10*3/uL (ref 150–400)
RBC: 4.31 MIL/uL (ref 3.87–5.11)
RDW: 18.1 % — AB (ref 11.5–15.5)
WBC: 9.6 10*3/uL (ref 4.0–10.5)

## 2017-09-02 LAB — GLUCOSE, CAPILLARY
GLUCOSE-CAPILLARY: 85 mg/dL (ref 65–99)
GLUCOSE-CAPILLARY: 90 mg/dL (ref 65–99)

## 2017-09-02 LAB — BASIC METABOLIC PANEL
ANION GAP: 11 (ref 5–15)
BUN: 24 mg/dL — ABNORMAL HIGH (ref 6–20)
CO2: 23 mmol/L (ref 22–32)
Calcium: 11.7 mg/dL — ABNORMAL HIGH (ref 8.9–10.3)
Chloride: 99 mmol/L — ABNORMAL LOW (ref 101–111)
Creatinine, Ser: 0.6 mg/dL (ref 0.44–1.00)
Glucose, Bld: 96 mg/dL (ref 65–99)
POTASSIUM: 3.4 mmol/L — AB (ref 3.5–5.1)
SODIUM: 133 mmol/L — AB (ref 135–145)

## 2017-09-02 LAB — CULTURE, BLOOD (ROUTINE X 2)
CULTURE: NO GROWTH
Special Requests: ADEQUATE

## 2017-09-02 MED ORDER — TRAMADOL-ACETAMINOPHEN 37.5-325 MG PO TABS: 1.0000 | ORAL_TABLET | ORAL | Status: DC | PRN

## 2017-09-02 NOTE — Progress Notes (Signed)
CSW acknowledges consult for [facility]. CSW updated RN CM to assists with [facility] needs. CSW signing off.      Virgie Dad Shavontae Gibeault, MSW, [place] [facility] Clinical Social Worker [phone]

## 2017-09-02 NOTE — Progress Notes (Signed)
PROGRESS NOTE  Sharon Warner NWG:[id number] DOB: [birth year] DOA: 08/28/2017 PCP: Garwin Brothers, MD  HPI/Recap of past 24 hours: HPI from Dr Jennette Kettle on 08/28/17 Sharon Warner is a 66 y.o. female with medical history significant of paraplegia, neurogenic bladder, wheelchair-bound, chronic indwelling Foley catheter, sacral decubitus ulcer, morbid obesity, HTN, type II DM, sacral osteomyelitis, CHF presents to the ED, with c/o SOB, fevers, generalized weakness, chest congestion with mild productive cough for a couple of days, not improving. PTA, pt developed L sided chest pain, while husband was moving patient. CP lasted for a few mins and stopped after repositioning. CP does not radiate, and nothing made it better. Pt also noted cloudy urine recently and suspects she may have a UTI. Pt has a chronic stage 4 sacral decubitus, seeing wound care, but doesn't appear to be grossly infected. Of note, pt was admitted in 03/2017 for sepsis/bacteremia with sacroiliitis/UTI.  BCx that admission grew out proteus and group B strep of which she completed long duration course of Keflex and flagyl, off ABx since end of Aug. In the ED, pt was noted to be tachycardic, with mild leukocytosis. Admitted for sepsis likely due to UTI.  Interim history: On 11/30 evening, pt was noted to spike a temp, tachycardic and hypotensive, sepsis protocol was initiated. Pt was given about 2.5L of IVF due to severe hypotension, broad spectrum AB. Overnight on 11/30, pt desaturated, was requiring non-rebreather intermittenly. Consult to PCCM was initiated on 12/1 for persistent hypotension and respiratory distress of which she was later transferred to the ICU.  Today, pt reports feeling better, reports breathing is much improved. Denies any chest pain, abdominal pain, N/V/d/C, remained afebrile overnight. ICU team signed off. Patient stable to d/c to telemetry   Assessment/Plan: Principal Problem:   Catheter-associated urinary tract  infection (HCC) Active Problems:   Paraplegia (HCC)   Neurogenic bladder   Hypercalcemia   Sacral decubitus ulcer, stage IV (HCC)   Hypotension   Hypoxia  #Sepsis Improving Febrile, leukocytosis, tachycardic, hypotension on presentation Likely due to CAUTI Vs CAP Vs sacral wound Patient is alert, awake, oriented, cooperative, no respiratory distress Lactic acid within normal limits, pro calcitonin 0.33 Chest x-ray negative, repeat showed congestion CT angio negative for PE but shows right upper lobe density could be a pneumonia and right lower lobe mucous plug UA showed large leukocytes, WBC TNTC, nitrite positive Blood culture on 1 bottle showed coagulase neg staph, likely contaminant Repeat BC on 11/29, 11/30 showed NGTD Urine culture showed multiple species, likely colonization due to chronic foley Status post 2.5 L of IV bolus Discontinue IV Rocephin and ceftriaxone Continue cefepime, azithromycin ICU team signed off, transferred to telemetry  #Acute hypoxic respiratory failure Improving Likely due to sepsis/CAP Vs pulm congestion due to vol overload vs aspiration PNA Repeat CXR showed pulm edema CT angio showed right upper lobe density could be a pneumonia and right lower lobe mucous plug Duonebs, Chest PT to help with mucous plug as pt is unable to cough up secretions Swallow eval done: Mild-mod dysphagia, rec dysphagia 3, mech soft + nectar thick liquids  #Sacral decubitus ulcer stage IV No new drainage or obvious signs of infection from ulcer Wound care consult placed, following  #History of sacral osteomyelitis/bacteremia Diagnosed in 03/2017 with bacteremia Blood culture during that admission grew Proteus and group B strep, both sensitive to Rocephin Completed treatment with Keflex and Flagyl in 04/2017  #CHF Echo in 03/2017 showed LVEF 25-30% Monitor closely Hold home Toprol 12.5mg   due to hypotension  #Hypertension Currently hypotensive, ICU team started  midodrine Hold home Toprol as above  #DM type II Last A1c in 03/2017, 5.0 Diet controlled SSI, Accu-Cheks, hypoglycemic protocol  #Hypokalemia Replace as needed  #Paraplegia and neurogenic bladder Replace Foley    Code Status: Full  Family Communication: None at bedside  Disposition Plan: Home once stable, likely 09/03/17   Consultants:  PCCM  Procedures:  None  Antimicrobials:  IV Azithromycin  IV cefepime  DVT prophylaxis: Lovenox   Objective: Vitals:   09/02/17 0900 09/02/17 1000 09/02/17 1100 09/02/17 1135  BP: 108/66 109/66 93/72   Pulse: 75 (!) 55 79   Resp: (!) 22 18 19    Temp:    97.6 F (36.4 C)  TempSrc:    Oral  SpO2: 98% 96% (!) 86%   Weight:      Height:        Intake/Output Summary (Last 24 hours) at 09/02/2017 1151 Last data filed at 09/02/2017 1000 Gross per 24 hour  Intake 440 ml  Output 650 ml  Net -210 ml   Filed Weights   08/28/17 1652 08/29/17 0427  Weight: 65.8 kg (145 lb) 61.1 kg (134 lb 12.8 oz)    Exam:   General: Alert, awake, oriented x3, no distress, chronically ill appearing  Cardiovascular: S1-S2 present, no added heart sounds  Respiratory: Bibasilar crackles noted  Abdomen: Soft, nontender, nondistended, bowel sounds present  Musculoskeletal: No bilateral pedal edema  Skin: Stage IV sacral decubitus ulcer  Psychiatry: Normal mood   Data Reviewed: CBC: Recent Labs  Lab 08/29/17 0328 08/30/17 0402 08/31/17 0507 09/01/17 0306 09/02/17 0343  WBC 15.8* 16.1* 13.6* 9.1 9.6  NEUTROABS  --  10.9* 9.0* 5.5 5.2  HGB 9.6* 8.4* 8.3* 8.0* 9.0*  HCT 32.2* 28.6* 28.5* 27.2* 31.2*  MCV 71.6* 73.0* 72.9* 72.1* 72.4*  PLT 402* 350 335 317 008   Basic Metabolic Panel: Recent Labs  Lab 08/29/17 0328 08/30/17 0402 08/31/17 0507 09/01/17 0306 09/02/17 0343  NA 131* 133* 136 135 133*  K 3.5 3.5 3.2* 3.7 3.4*  CL 102 105 105 105 99*  CO2 19* 22 23 22 23   GLUCOSE 141* 101* 92 91 96  BUN 23* 25* 26* 28*  24*  CREATININE 0.67 0.63 0.60 0.56 0.60  CALCIUM 10.8* 10.9* 11.1* 11.2* 11.7*   GFR: Estimated Creatinine Clearance: 59.6 mL/min (by C-G formula based on SCr of 0.6 mg/dL). Liver Function Tests: No results for input(s): AST, ALT, ALKPHOS, BILITOT, PROT, ALBUMIN in the last 168 hours. No results for input(s): LIPASE, AMYLASE in the last 168 hours. No results for input(s): AMMONIA in the last 168 hours. Coagulation Profile: No results for input(s): INR, PROTIME in the last 168 hours. Cardiac Enzymes: Recent Labs  Lab 08/28/17 2225 08/29/17 0328 08/29/17 0735  TROPONINI <0.03 <0.03 <0.03   BNP (last 3 results) No results for input(s): PROBNP in the last 8760 hours. HbA1C: No results for input(s): HGBA1C in the last 72 hours. CBG: Recent Labs  Lab 09/01/17 1751 09/01/17 1808 09/01/17 2329 09/02/17 0752 09/02/17 1131  GLUCAP 111* 101* 93 85 90   Lipid Profile: No results for input(s): CHOL, HDL, LDLCALC, TRIG, CHOLHDL, LDLDIRECT in the last 72 hours. Thyroid Function Tests: No results for input(s): TSH, T4TOTAL, FREET4, T3FREE, THYROIDAB in the last 72 hours. Anemia Panel: No results for input(s): VITAMINB12, FOLATE, FERRITIN, TIBC, IRON, RETICCTPCT in the last 72 hours. Urine analysis:    Component Value Date/Time   COLORURINE  YELLOW 08/28/2017 1807   APPEARANCEUR TURBID (A) 08/28/2017 1807   LABSPEC 1.011 08/28/2017 1807   PHURINE 6.0 08/28/2017 1807   GLUCOSEU NEGATIVE 08/28/2017 1807   HGBUR MODERATE (A) 08/28/2017 1807   BILIRUBINUR NEGATIVE 08/28/2017 1807   KETONESUR 5 (A) 08/28/2017 1807   PROTEINUR 100 (A) 08/28/2017 1807   NITRITE POSITIVE (A) 08/28/2017 1807   LEUKOCYTESUR LARGE (A) 08/28/2017 1807   Sepsis Labs: @LABRCNTIP (procalcitonin:4,lacticidven:4)  ) Recent Results (from the past 240 hour(s))  Culture, blood (routine x 2)     Status: Abnormal   Collection Time: 08/28/17  4:54 PM  Result Value Ref Range Status   Specimen Description BLOOD  BLOOD LEFT FOREARM  Final   Special Requests   Final    Blood Culture adequate volume BOTTLES DRAWN AEROBIC AND ANAEROBIC   Culture  Setup Time   Final    GRAM POSITIVE COCCI IN CLUSTERS ANAEROBIC BOTTLE ONLY CRITICAL RESULT CALLED TO, READ BACK BY AND VERIFIED WITH: V BRYK PHARMD 0149 08/30/17 A BROWNING    Culture (A)  Final    STAPHYLOCOCCUS SPECIES (COAGULASE NEGATIVE) THE SIGNIFICANCE OF ISOLATING THIS ORGANISM FROM A SINGLE SET OF BLOOD CULTURES WHEN MULTIPLE SETS ARE DRAWN IS UNCERTAIN. PLEASE NOTIFY THE MICROBIOLOGY DEPARTMENT WITHIN ONE WEEK IF SPECIATION AND SENSITIVITIES ARE REQUIRED.    Report Status 08/31/2017 FINAL  Final  Blood Culture ID Panel (Reflexed)     Status: Abnormal   Collection Time: 08/28/17  4:54 PM  Result Value Ref Range Status   Enterococcus species NOT DETECTED NOT DETECTED Final   Listeria monocytogenes NOT DETECTED NOT DETECTED Final   Staphylococcus species DETECTED (A) NOT DETECTED Final    Comment: Methicillin (oxacillin) susceptible coagulase negative staphylococcus. Possible blood culture contaminant (unless isolated from more than one blood culture draw or clinical case suggests pathogenicity). No antibiotic treatment is indicated for blood  culture contaminants. CRITICAL RESULT CALLED TO, READ BACK BY AND VERIFIED WITH: V BRYK PHARMD 0149 08/30/17 A BROWNING    Staphylococcus aureus NOT DETECTED NOT DETECTED Final   Methicillin resistance NOT DETECTED NOT DETECTED Final   Streptococcus species NOT DETECTED NOT DETECTED Final   Streptococcus agalactiae NOT DETECTED NOT DETECTED Final   Streptococcus pneumoniae NOT DETECTED NOT DETECTED Final   Streptococcus pyogenes NOT DETECTED NOT DETECTED Final   Acinetobacter baumannii NOT DETECTED NOT DETECTED Final   Enterobacteriaceae species NOT DETECTED NOT DETECTED Final   Enterobacter cloacae complex NOT DETECTED NOT DETECTED Final   Escherichia coli NOT DETECTED NOT DETECTED Final   Klebsiella  oxytoca NOT DETECTED NOT DETECTED Final   Klebsiella pneumoniae NOT DETECTED NOT DETECTED Final   Proteus species NOT DETECTED NOT DETECTED Final   Serratia marcescens NOT DETECTED NOT DETECTED Final   Haemophilus influenzae NOT DETECTED NOT DETECTED Final   Neisseria meningitidis NOT DETECTED NOT DETECTED Final   Pseudomonas aeruginosa NOT DETECTED NOT DETECTED Final   Candida albicans NOT DETECTED NOT DETECTED Final   Candida glabrata NOT DETECTED NOT DETECTED Final   Candida krusei NOT DETECTED NOT DETECTED Final   Candida parapsilosis NOT DETECTED NOT DETECTED Final   Candida tropicalis NOT DETECTED NOT DETECTED Final  Urine culture     Status: Abnormal   Collection Time: 08/28/17  6:14 PM  Result Value Ref Range Status   Specimen Description URINE, CATHETERIZED  Final   Special Requests NONE  Final   Culture MULTIPLE SPECIES PRESENT, SUGGEST RECOLLECTION (A)  Final   Report Status 08/29/2017 FINAL  Final  Culture, blood (routine x 2)     Status: None   Collection Time: 08/28/17  8:37 PM  Result Value Ref Range Status   Specimen Description BLOOD RIGHT ANTECUBITAL  Final   Special Requests   Final    Blood Culture adequate volume BOTTLES DRAWN AEROBIC AND ANAEROBIC   Culture NO GROWTH 5 DAYS  Final   Report Status 09/02/2017 FINAL  Final  Culture, blood (routine x 2)     Status: None (Preliminary result)   Collection Time: 08/29/17  2:48 PM  Result Value Ref Range Status   Specimen Description BLOOD LEFT HAND  Final   Special Requests   Final    BOTTLES DRAWN AEROBIC AND ANAEROBIC Blood Culture adequate volume   Culture NO GROWTH 4 DAYS  Final   Report Status PENDING  Incomplete  Culture, blood (routine x 2)     Status: None (Preliminary result)   Collection Time: 08/29/17  2:56 PM  Result Value Ref Range Status   Specimen Description BLOOD RIGHT HAND  Final   Special Requests IN PEDIATRIC BOTTLE Blood Culture adequate volume  Final   Culture NO GROWTH 4 DAYS  Final    Report Status PENDING  Incomplete  Respiratory Panel by PCR     Status: None   Collection Time: 08/30/17  9:03 AM  Result Value Ref Range Status   Adenovirus NOT DETECTED NOT DETECTED Final   Coronavirus 229E NOT DETECTED NOT DETECTED Final   Coronavirus HKU1 NOT DETECTED NOT DETECTED Final   Coronavirus NL63 NOT DETECTED NOT DETECTED Final   Coronavirus OC43 NOT DETECTED NOT DETECTED Final   Metapneumovirus NOT DETECTED NOT DETECTED Final   Rhinovirus / Enterovirus NOT DETECTED NOT DETECTED Final   Influenza A NOT DETECTED NOT DETECTED Final   Influenza B NOT DETECTED NOT DETECTED Final   Parainfluenza Virus 1 NOT DETECTED NOT DETECTED Final   Parainfluenza Virus 2 NOT DETECTED NOT DETECTED Final   Parainfluenza Virus 3 NOT DETECTED NOT DETECTED Final   Parainfluenza Virus 4 NOT DETECTED NOT DETECTED Final   Respiratory Syncytial Virus NOT DETECTED NOT DETECTED Final   Bordetella pertussis NOT DETECTED NOT DETECTED Final   Chlamydophila pneumoniae NOT DETECTED NOT DETECTED Final   Mycoplasma pneumoniae NOT DETECTED NOT DETECTED Final  MRSA PCR Screening     Status: None   Collection Time: 08/30/17  8:34 PM  Result Value Ref Range Status   MRSA by PCR NEGATIVE NEGATIVE Final    Comment:        The GeneXpert MRSA Assay (FDA approved for NASAL specimens only), is one component of a comprehensive MRSA colonization surveillance program. It is not intended to diagnose MRSA infection nor to guide or monitor treatment for MRSA infections.       Studies: No results found.  Scheduled Meds: . aspirin EC  81 mg Oral Daily  . cholecalciferol  1,000 Units Oral Daily  . dextromethorphan-guaiFENesin  1 tablet Oral BID  . enoxaparin (LOVENOX) injection  40 mg Subcutaneous Q24H  . feeding supplement (PRO-STAT SUGAR FREE 64)  30 mL Oral BID  . ferrous sulfate  325 mg Oral Daily  . fluticasone  1 spray Each Nare Daily  . insulin aspart  0-9 Units Subcutaneous TID WC  . lactobacillus  acidophilus  1 tablet Oral BID  . levalbuterol  0.63 mg Nebulization Q6H  . midodrine  2.5 mg Oral TID  . multivitamin with minerals  1 tablet Oral Daily  . protein supplement shake  2 oz Oral BID BM  . sodium hypochlorite   Topical QODAY  . zinc sulfate  220 mg Oral Daily    Continuous Infusions: . azithromycin 500 mg (09/01/17 1635)  . ceFEPime (MAXIPIME) IV 1 g (09/02/17 0604)     LOS: 5 days     Alma Friendly, MD [facility]   If 7PM-7AM, please contact night-coverage [email or web address] Password [email or web address] 09/02/2017, 11:51 AM

## 2017-09-02 NOTE — Progress Notes (Signed)
  Speech Language Pathology Treatment: Dysphagia  Patient Details Name: Sharon Warner MRN: [id number] DOB: 02-Nov-1950 Today's Date: 09/02/2017 Time: 1600-1610 SLP Time Calculation (min) (ACUTE ONLY): 10 min  Assessment / Plan / Recommendation Clinical Impression  F/u per family request -pt was having difficulty masticating dysphagia 3 consistency.  Has been downgraded to dysphagia 2.  Pt for D/C tomorrow - both pt/spouse with questions re: thickener, how to prepare drinks, and whether its use would be a long-term recommendation.  Demonstrated to spouse how to thicken liquids; reviewed results of yesterday's MBS.  SLP will f/u next date to determine if pt needs to be D/Cd on thickened liquids.  Pt/spouse verbalize understanding.    HPI HPI: Pt is a 66 year old paraplegic with a chronic indwelling Foley who presented on 11/29 with transient chest pain followed by chills and chest congestion. She was admitted for possible sepsis from UTI versus sacral wound, although there is also concern for URI or early PNA. On 11/30, pt also desaturated, requiring intermittent NRB and transfer to ICU. PMH is also significant forparaplegia, neurogenic bladder, wheelchair-bound, chronic indwelling Foley catheter, sacral decubitus ulcer, morbid obesity, HTN, type II DM, sacral osteomyelitis, chronicCHF, OSA.       SLP Plan  Continue with current plan of care       Recommendations  Diet recommendations: Dysphagia 2 (fine chop);Nectar-thick liquid Liquids provided via: Cup;Straw Medication Administration: Whole meds with puree                Oral Care Recommendations: Oral care BID SLP Visit Diagnosis: Dysphagia, oropharyngeal phase (R13.12) Plan: Continue with current plan of care       GO                Juan Quam Laurice 09/02/2017, 4:21 PM

## 2017-09-02 NOTE — Progress Notes (Signed)
Spoke w/ RD and clarified pt diet. Pt unable to chew/swallow even small hard particles. In addition to Dysphagia II diet, pt needs pureed or very soft foods. At home she can consume fish and soft proteins.

## 2017-09-02 NOTE — Care Management Note (Signed)
Case Management Note  Patient Details  Name: Sharon Warner MRN: [id number] Date of Birth: 1951/01/30  Subjective/Objective:  Pt admitted with SOB, fevers, generalized weakness, chest congestion with mild productive cough for a couple of days, not improving  Action/Plan:   PTA from home with husband (husband also has limited functionality requiring wheelchair/rollator).  Pt is a paraplegic and is wheelchair bound but states she is safe in the home with the current plan as follows.  Pt informed CM that she has aides provided Monday through Sunday 9am -1pm and then again at 6pm-11pm that are privately paid (husband has contacted agency directly and informed them that pt will need them to resume services starting 12/5 at 6pm). Both pt and husband want pt to return home and decline PT eval and or possible facility placement.   CM will need to arrange transport via PTAR for pt to arrive to the home after 6pm.  [facility].  Pt has lift, wheelchair in the home and per pt and husband - they have all required equipment in the home.   Pt is also active with [facility] for [facility] and PT - agency aware of admit and CM will request resumption orders   Expected Discharge Date:                  Expected Discharge Plan:  [place]  In-House Referral:     Discharge planning Services     Post Acute Care Choice:  Resumption of Svcs/PTA Provider, Home Health Choice offered to:  Patient  DME Arranged:    DME Agency:     HH Arranged:  RN, PT [place] Agency:  [facility] (formerly [employer])  Status of Service:  In process, will continue to follow  If discussed at Long Length of Stay Meetings, dates discussed:    Additional Comments:  Maryclare Labrador, RN 09/02/2017, 2:08 PM

## 2017-09-02 NOTE — Progress Notes (Signed)
CSW consulted for potential need for SNF. CSW was later informed that husband and pt are not wanting SNF as they have had it in the past, and wasn't pleased. There are no further CSW needs at this time. CSW signing off.     Virgie Dad Ronia Hazelett, MSW, [facility] Clinical Social Worker [phone]

## 2017-09-03 DIAGNOSIS — L89154 Pressure ulcer of sacral region, stage 4: Secondary | ICD-10-CM

## 2017-09-03 DIAGNOSIS — N390 Urinary tract infection, site not specified: Secondary | ICD-10-CM

## 2017-09-03 DIAGNOSIS — T83511A Infection and inflammatory reaction due to indwelling urethral catheter, initial encounter: Principal | ICD-10-CM

## 2017-09-03 DIAGNOSIS — J189 Pneumonia, unspecified organism: Secondary | ICD-10-CM

## 2017-09-03 DIAGNOSIS — N319 Neuromuscular dysfunction of bladder, unspecified: Secondary | ICD-10-CM

## 2017-09-03 LAB — CBC WITH DIFFERENTIAL/PLATELET
BASOS PCT: 0 %
Basophils Absolute: 0 10*3/uL (ref 0.0–0.1)
EOS PCT: 1 %
Eosinophils Absolute: 0.1 10*3/uL (ref 0.0–0.7)
HCT: 27.7 % — ABNORMAL LOW (ref 36.0–46.0)
Hemoglobin: 8.3 g/dL — ABNORMAL LOW (ref 12.0–15.0)
LYMPHS ABS: 3.4 10*3/uL (ref 0.7–4.0)
Lymphocytes Relative: 39 %
MCH: 22 pg — AB (ref 26.0–34.0)
MCHC: 30 g/dL (ref 30.0–36.0)
MCV: 73.5 fL — AB (ref 78.0–100.0)
MONO ABS: 0.6 10*3/uL (ref 0.1–1.0)
Monocytes Relative: 7 %
NEUTROS ABS: 4.6 10*3/uL (ref 1.7–7.7)
NEUTROS PCT: 53 %
PLATELETS: 372 10*3/uL (ref 150–400)
RBC: 3.77 MIL/uL — ABNORMAL LOW (ref 3.87–5.11)
RDW: 18.6 % — ABNORMAL HIGH (ref 11.5–15.5)
WBC: 8.7 10*3/uL (ref 4.0–10.5)

## 2017-09-03 LAB — CULTURE, BLOOD (ROUTINE X 2)
CULTURE: NO GROWTH
Culture: NO GROWTH
SPECIAL REQUESTS: ADEQUATE
Special Requests: ADEQUATE

## 2017-09-03 LAB — BASIC METABOLIC PANEL
Anion gap: 10 (ref 5–15)
BUN: 24 mg/dL — AB (ref 6–20)
CALCIUM: 11.6 mg/dL — AB (ref 8.9–10.3)
CHLORIDE: 103 mmol/L (ref 101–111)
CO2: 23 mmol/L (ref 22–32)
CREATININE: 0.52 mg/dL (ref 0.44–1.00)
GFR calc Af Amer: >60 mL/min (ref >60)
GFR calc non Af Amer: >60 mL/min (ref >60)
GLUCOSE: 84 mg/dL (ref 65–99)
Potassium: 4.1 mmol/L (ref 3.5–5.1)
Sodium: 136 mmol/L (ref 135–145)

## 2017-09-03 LAB — GLUCOSE, CAPILLARY
GLUCOSE-CAPILLARY: 87 mg/dL (ref 65–99)
Glucose-Capillary: 85 mg/dL (ref 65–99)
Glucose-Capillary: 83 mg/dL (ref 65–99)
Glucose-Capillary: 101 mg/dL — ABNORMAL HIGH (ref 65–99)

## 2017-09-03 MED ORDER — SENNOSIDES-DOCUSATE SODIUM 8.6-50 MG PO TABS: 1.0000 | ORAL_TABLET | Freq: Every evening | ORAL | Status: DC | PRN

## 2017-09-03 MED ORDER — DM-GUAIFENESIN ER 30-600 MG PO TB12: 1.0000 | ORAL_TABLET | Freq: Two times a day (BID) | ORAL | 0 refills | Status: DC

## 2017-09-03 MED ORDER — OXYMETAZOLINE HCL 0.05 % NA SOLN
1.0000 | Freq: Two times a day (BID) | NASAL | Status: DC | PRN
  Filled 2017-09-03: qty 15

## 2017-09-03 MED ORDER — CEFUROXIME AXETIL 500 MG PO TABS: 500.0000 mg | ORAL_TABLET | Freq: Two times a day (BID) | ORAL | 0 refills | Status: AC

## 2017-09-03 NOTE — Care Management Note (Signed)
Case Management Note  Patient Details  Name: Sharon Warner MRN: [id number] Date of Birth: Jun 26, 1951  Subjective/Objective:  Pt admitted with SOB, fevers, generalized weakness, chest congestion with mild productive cough for a couple of days, not improving  Action/Plan:   PTA from home with husband (husband also has limited functionality requiring wheelchair/rollator).  Pt is a paraplegic and is wheelchair bound but states she is safe in the home with the current plan as follows.  Pt informed CM that she has aides provided Monday through Sunday 9am -1pm and then again at 6pm-11pm that are privately paid (husband has contacted agency directly and informed them that pt will need them to resume services starting 12/5 at 6pm). Both pt and husband want pt to return home and decline PT eval and or possible facility placement.   CM will need to arrange transport via PTAR for pt to arrive to the home after 6pm.  [facility].  Pt has lift, wheelchair in the home and per pt and husband - they have all required equipment in the home.   Pt is also active with [facility] for [facility] and PT - agency aware of admit and CM will request resumption orders   Expected Discharge Date:                  Expected Discharge Plan:  [place]  In-House Referral:     Discharge planning Services     Post Acute Care Choice:  Resumption of Svcs/PTA Provider, Home Health Choice offered to:  Patient  DME Arranged:    DME Agency:     HH Arranged:  RN, PT [place] Agency:  [facility] (formerly [employer])  Status of Service:  In process, will continue to follow  If discussed at Long Length of Stay Meetings, dates discussed:    Additional Comments: 09/03/2017 CM informed by [facility] that agency will not accept pt back for [facility] - agency to follow up on reasoning - however pt will need new [place] agency for [facility] (wound management) and PT.  Pt transferred to [place] - 82M CM will  communicate new [facility] agency need with 6E unit CM. Maryclare Labrador, RN 09/03/2017, 9:19 AM

## 2017-09-03 NOTE — Progress Notes (Signed)
Pt continues to have SpO2 in high 90s on room air.  Able t  use Acapella and IS with good effort and technique with some assistance.  Breath sounds CTA, decreased.  Cough remains weak.  Continue bronchial hygiene measures.

## 2017-09-03 NOTE — Progress Notes (Addendum)
  Speech Language Pathology Treatment: Dysphagia  Patient Details Name: Sharon Warner MRN: [id number] DOB: [birth year] Today's Date: 09/03/2017 Time: 5400-8676 SLP Time Calculation (min) (ACUTE ONLY): 10 min  Assessment / Plan / Recommendation Clinical Impression  F/u for D/C education, recommending that pt continue nectar-thick liquids until she is stronger, can see an SLP, and undergo a repeat MBS.  Pt politely declined, expressing her preference to drink thin liquids.  We reviewed results of MBS, including aspiration of thin liquids and discussed the potential risk of developing aspiration pna.  Pt verbalized understanding but continued to assert her desire to consume thin liquids.  Recommended she take thickener home with her, reviewed basic precautions, and advised her to contact physician should she develop fever, s/s of pna.  She verbalized agreement. Our services will sign off.   HPI HPI: Pt is a 66 year old paraplegic with a chronic indwelling Foley who presented on 11/29 with transient chest pain followed by chills and chest congestion. She was admitted for possible sepsis from UTI versus sacral wound, although there is also concern for URI or early PNA. On 11/30, pt also desaturated, requiring intermittent NRB and transfer to ICU. PMH is also significant forparaplegia, neurogenic bladder, wheelchair-bound, chronic indwelling Foley catheter, sacral decubitus ulcer, morbid obesity, HTN, type II DM, sacral osteomyelitis, chronicCHF, OSA.       SLP Plan  Pt declines thickened liquids; for D/C today       Recommendations  Diet recommendations: Dysphagia 3 (mechanical soft);Nectar-thick liquid Liquids provided via: Cup;Straw Medication Administration: Whole meds with puree                SLP Visit Diagnosis: Dysphagia, oropharyngeal phase (R13.12) Plan: All goals met       GO                Juan Quam Laurice 09/03/2017, 3:06 PM

## 2017-09-03 NOTE — Plan of Care (Signed)
Pt denies anxiety. Displays no signs of distress.

## 2017-09-03 NOTE — Progress Notes (Addendum)
Discharge Planning: [facility] Director, Dr Reynaldo Minium to review case and dc disposition. Updated attending on [facility] agencies were not accepting for care post dc. NCM contacted [facility] to reconsider taking pt back for [facility]. HH rep states will review and make NCM aware on tomorrow if agency can accept referral. NCM contacted pt's husband to discuss [facility] agency and denial from 4 different agencies for [facility]. Husband states he will continue to utilize agency [facility] # [phone] for private duty aide and care for her at home. Will continue to utilize [facility] every other Monday.  PTAR arranged.  Jonnie Finner RN CCM Case Mgmt phone [phone]

## 2017-09-03 NOTE — Discharge Instructions (Signed)
Urosepsis Urosepsis is an infection that has spread to the blood (sepsis) from the urinary tract. The urinary tract includes the kidneys, the tubes that connect the kidneys to the bladder (ureters), the bladder, and the tube that passes urine out of the body (urethra). These organs make, store, and pass urine from the body. Urosepsis is a severe illness that can be life-threatening if it is not treated immediately. What are the causes? Causes of this condition include:  A urinary tract infection (UTI) that spreads to your blood.  A urinary tract blockage (obstruction) from kidney stones.  Having a long, thin tube (catheter) that stays in place to drain urine (indwelling urinary catheter).  Prostate enlargement (prostatitis) or prostate infection (abscess).  What increases the risk? This condition is more likely to develop in people who:  Are female.  Are 65 years of age or older.  Have a long-term (chronic) disease, such as diabetes.  Have a weak body defense system (immune system).  Have a condition that lessens or changes the urine flow, such as a kidney or bladder stone, prostate disease, or a tumor of the urinary tract.  Have had surgery of the urinary tract or use a drainage tube for urine (urinary catheter).  Have lost feeling below the waist or are in a wheelchair.  What are the signs or symptoms? Early symptoms of this condition are similar to the symptoms of a severe UTI. These may include:  Pain in your side, back, or lower abdomen.  Chills and a fever.  Nausea and vomiting.  Frequent need to pass urine.  Burning pain when passing urine.  Bloody or cloudy urine.  Bad-smelling urine.  Fatigue.  Once the infection has spread to the blood and a sepsis reaction starts, additional symptoms may include:  High fever.  Chills with shaking.  Fast breathing.  Fast heartbeat.  Cold and clammy skin.  Anxiety.  Confusion.  Severe pain in the  abdomen.  Trouble breathing.  Trouble passing urine or not being able to pass urine.  Fainting.  How is this diagnosed? This condition is diagnosed based on your symptoms, medical history, and a physical exam. You may also have tests, including:  Urine tests to check kidney function and look for infection.  Blood tests to check body functions and look for infection.  Imaging studies to look for blocked flow of urine in the urinary tract.  How is this treated? This condition is a medical emergency that is treated at the hospital. Treatment may include:  Receiving one or more of the following through an IV tube: ? Fluids. ? Antibiotic medicines. ? Medicines to support blood pressure. ? Steroids to reduce the inflammatory response.  Oxygen and breathing support.  Removing a urinary catheter that may be a source of infection, if this applies.  Use of a machine (dialysis) to filter the blood.  Surgery to drain infected areas or restore urine flow. This is rare.  Follow these instructions at home:  Take over-the-counter and prescription medicines only as told by your health care provider.  Take your antibiotic medicine as told by your health care provider. Do not stop taking the antibiotic even if you start to feel better.  Drink enough fluid to keep your urine clear or pale yellow.  Return to your normal activities as told by your health care provider. Ask your health care provider what activities are safe for you.  Keep all follow-up visits as told by your health care provider. This is   important. Contact a health care provider if:  Your symptoms do not improve or they get worse.  You have new symptoms of a UTI. Get help right away if:  You have new or continued symptoms of sepsis after hospitalization, such as: ? High fever. ? Chills. ? Difficulty breathing. ? Confusion. ? Nausea and vomiting. These symptoms may represent a serious problem that is an emergency.  Do not wait to see if the symptoms will go away. Get medical help right away. Call your local emergency services (911 in the [place] Do not drive yourself to the hospital. This information is not intended to replace advice given to you by your health care provider. Make sure you discuss any questions you have with your health care provider. Document Released: 09/16/2005 Document Revised: 05/14/2016 Document Reviewed: 03/11/2016 Elsevier Interactive Patient Education  2018 [employer]  

## 2017-09-03 NOTE — Care Management Note (Addendum)
Case Management Note  Patient Details  Name: Sharon Warner MRN: [id number] Date of Birth: 07-24-1951  Subjective/Objective:    Catheter-associated UTI, paraplegia, neurogenic bladder, sacral decubitus ulcer                Action/Plan: Discharge Planning: NCM received call from [facility] unable to accept referral due to inability to help with healing of wound. Contacted husband via to make aware and offered choice for [facility]. Husband requested [facility] for [facility]. Contacted [facility] with new referral. Pt has hospital bed with air mattress, lift, and wheelchair at home. Spoke to husband and her PCP is Dr Dorthy Cooler and she goes to [facility] in [facility]. States he does dressing changes on the days the [facility] does not come out to home. PTAR arranged.   PCP [name]  Expected Discharge Date:  09/03/17               Expected Discharge Plan:  [place]  In-House Referral:  NA  Discharge planning Services  CM Consult  Post Acute Care Choice:  Home Health Choice offered to:  Patient  DME Arranged:  N/A DME Agency:  NA  HH Arranged:  RN, PT, Nurse's Aide [place] Agency:  [place]  Status of Service:  Completed, signed off  If discussed at [place] of Stay Meetings, dates discussed:    Additional Comments: 09/03/2017 117 pm Received call back from [facility] and they are unable to accept referral due to concerns about pt's husband ability to care for pt in the home. Contacted [facility] rep with referral. Waiting call back. Jonnie Finner RN CCM Case Mgmt phone [phone]  09/03/2017 307 pm [facility] has declined referral. [facility] with new referral. [facility] rep will review [place] orders and check insurance coverage. Contacted attending with new information. Discharge scheduled once [facility] arrange for dc. Jonnie Finner RN CCM Case Mgmt phone [phone]   09/03/2017 417 pm Received call from [facility] rep and [facility] not able accept pt due to acuity. PTAR cancelled.   Updated husband. Jonnie Finner RN CCM Case Mgmt phone [phone]   Erenest Rasher, RN 09/03/2017, 12:20 PM

## 2017-09-03 NOTE — Discharge Summary (Signed)
Physician Discharge Summary  Sharon Warner GYI:[id number] DOB: 1951/06/06 DOA: 08/28/2017  PCP: Garwin Brothers, MD  Admit date: 08/28/2017 Discharge date: 09/03/2017  Admitted From: Home Disposition: Home  Recommendations for Outpatient Follow-up:  1. Follow up with PCP in 1 week   Home Health: Yes Equipment/Devices: None  Discharge Condition: Guarded CODE STATUS: Full Diet recommendation: Heart Healthy /as per SLP recommendations  Brief/Interim Summary: 66 year old female with history of paraplegia, neurogenic bladder, wheelchair-bound, chronic indwelling Foley catheter, sacral decubitus ulcer, hypertension, diabetes mellitus type 2, sacral osteomyelitis presented on 08/28/2017 with complaints of shortness of breath, fever, chest congestion and cough.  She was admitted for sepsis likely due to UTI and was started on broad-spectrum antibiotics.  Urine cultures multiple species; blood cultures initially grew coagulase negative Staphylococcus probably a contaminant, repeat cultures have been negative.  The patient had to be transferred to ICU briefly for hypotension and respiratory distress which has since then resolved.  Patient has been transferred out of ICU.  Patient refused nursing home placement.  Patient will be discharged to home with home care oral antibiotics.  Discharge Diagnoses:  Principal Problem:   Catheter-associated urinary tract infection ([place]) Active Problems:   Paraplegia (HCC)   Neurogenic bladder   Hypercalcemia   Sacral decubitus ulcer, stage IV (HCC)   Hypotension   Hypoxia  Sepsis -Probably similar to UTI versus pneumonia -Resolved  Urinary tract infection -In a patient with history of paraplegia, neurogenic bladder and chronic indwelling Foley -Urine culture is growing multiple species -Currently on cefepime and Zithromax -Discharge patient on Ceftin for 5 more days  Acute hypoxic respiratory failure probably secondary to sepsis and pneumonia versus  pulmonary congestion -Resolved.  Currently on room air -Diet as per SLP recommendations -Antibiotics plan as above -Blood cultures repeat are negative  Coagulase negative Staphylococcus bacteremia -Probably contaminant.  Repeat cultures are negative  Sacral decubitus ulcer stage IV with history of sacral osteomyelitis/bacteremia in the past treated with antibiotics -Wound care as per wound care nurse's recommendations.  Outpatient follow-up  Chronic compensated systolic heart failure -Echo in 03/2017 showed EF of 25-30% -Resume home Toprol.  Outpatient follow-up with primary care provider and/or cardiology  Hypertension -Pressure improved.  Resume Toprol  Diabetes mellitus type 2 -Diet controlled.  Outpatient follow-up  Leukocytosis -Resolved  Hypokalemia -Resolved next  Paraplegia and neurogenic bladder -Foley replaced.  Outpatient follow-up -Patient will benefit from home care including home nursing    Discharge Instructions  Discharge Instructions    Call MD for:  difficulty breathing, headache or visual disturbances   Complete by:  As directed    Call MD for:  extreme fatigue   Complete by:  As directed    Call MD for:  hives   Complete by:  As directed    Call MD for:  persistant dizziness or light-headedness   Complete by:  As directed    Call MD for:  persistant nausea and vomiting   Complete by:  As directed    Call MD for:  severe uncontrolled pain   Complete by:  As directed    Call MD for:  temperature >100.4   Complete by:  As directed    Diet - low sodium heart healthy   Complete by:  As directed    Discharge instructions   Complete by:  As directed    Diet as per SLP recommendations   Increase activity slowly   Complete by:  As directed      Allergies as of 09/03/2017  Reactions   Influenza Vaccines Other (See Comments)   Patient lost feeling in her legs and that spread   Levaquin [levofloxacin In D5w] Nausea Only   Penicillins Other  (See Comments)   From childhood: Has patient had a PCN reaction causing immediate rash, facial/tongue/throat swelling, SOB or lightheadedness with hypotension: Unknown Has patient had a PCN reaction causing severe rash involving mucus membranes or skin necrosis: Unknown Has patient had a PCN reaction that required hospitalization: No Has patient had a PCN reaction occurring within the last 10 years: No If all of the above answers are "NO", then may proceed with Cephalosporin use.   Simvastatin Other (See Comments)   Reaction not recalled; PATIENT STATED THAT SHE "WILL NOT TAKE"   Statins Other (See Comments)   PATIENT STATED THAT SHE "WILL NOT TAKE" THESE      Medication List    TAKE these medications   aspirin EC 81 MG tablet Take 81 mg by mouth daily.   BIOFREEZE 4 % Gel Generic drug:  Menthol (Topical Analgesic) Apply 1 application topically See admin instructions. APPLY 1-3 TIMES A DAY TO PAINFUL SITES   Biotin 10000 MCG Tabs Take 10,000 mcg by mouth daily.   cefUROXime 500 MG tablet Commonly known as:  CEFTIN Take 1 tablet (500 mg total) by mouth 2 (two) times daily for 5 days.   CENTRUM SILVER 50+WOMEN Tabs Take 1 tablet by mouth daily.   ciclopirox 8 % solution Commonly known as:  PENLAC Apply 1 application topically See admin instructions. TO BOTH FEET AT BEDTIME: Apply over nail and surrounding skin. Apply daily over previous coat. After seven (7) days, may remove with alcohol and continue cycle.   CRANBERRY PO Take 1 capsule by mouth daily.   dextromethorphan-guaiFENesin 30-600 MG 12hr tablet Commonly known as:  MUCINEX DM Take 1 tablet by mouth 2 (two) times daily.   ferrous sulfate 325 (65 FE) MG tablet Take 325 mg by mouth daily.   fluticasone 50 MCG/ACT nasal spray Commonly known as:  FLONASE Place 1 spray into both nostrils daily.   H-CHLOR 12 0.125 % Soln Generic drug:  [place] APPLY TO SACRUM 3 TIMES A WEEK: CLEAN AREA, PAT DRY, PACK WITH SOAKED  GAUZE, COVER   HYDROcodone-acetaminophen 7.5-325 MG tablet Commonly known as:  NORCO Take 1 tablet by mouth daily as needed (for severe pain).   Lactobacillus Tabs Take 1 tablet by mouth 2 (two) times daily.   Melatonin 3 MG Tabs Take 3 mg by mouth at bedtime as needed (for sleep).   metoprolol succinate 25 MG 24 hr tablet Commonly known as:  TOPROL-XL Take 12.5 mg by mouth daily.   midodrine 2.5 MG tablet Commonly known as:  PROAMATINE Take 1 tablet (2.5 mg total) by mouth 3 (three) times daily with meals.   naproxen 500 MG tablet Commonly known as:  NAPROSYN Take 500 mg by mouth daily as needed for pain.   Olopatadine HCl 0.2 % Soln Place 1 drop into both eyes 2 (two) times daily as needed (for itching).   promethazine 25 MG tablet Commonly known as:  PHENERGAN Take 25 mg by mouth every 6 (six) hours as needed for nausea/vomiting.   senna-docusate 8.6-50 MG tablet Commonly known as:  Senokot-S Take 1 tablet by mouth at bedtime as needed for mild constipation.   traMADol-acetaminophen 37.5-325 MG tablet Commonly known as:  ULTRACET Take 1 tablet by mouth every 4 (four) hours as needed (for pain).   VITAMIN C PO Take  1 tablet by mouth daily.   Vitamin D-3 1000 units Caps Take 1,000 Units by mouth daily.   ZICAM COLD REMEDY Tbdp Take 1 tablet by mouth daily as needed (for cold symptoms).   zinc sulfate 220 (50 Zn) MG capsule Take 220 mg by mouth daily.      Follow-up Information    [facility] Follow up.   Specialty:  [facility] Contact information: [address] [place] [address] [phone]        Garwin Brothers, MD. Schedule an appointment as soon as possible for a visit in 1 week(s).   Specialty:  Internal Medicine Contact information: [address] [place] [address] [phone]          Allergies  Allergen Reactions  . Influenza Vaccines Other (See Comments)    Patient lost feeling in her legs and that  spread  . Levaquin [Levofloxacin In D5w] Nausea Only  . Penicillins Other (See Comments)    From childhood: Has patient had a PCN reaction causing immediate rash, facial/tongue/throat swelling, SOB or lightheadedness with hypotension: Unknown Has patient had a PCN reaction causing severe rash involving mucus membranes or skin necrosis: Unknown Has patient had a PCN reaction that required hospitalization: No Has patient had a PCN reaction occurring within the last 10 years: No If all of the above answers are "NO", then may proceed with Cephalosporin use.  . Simvastatin Other (See Comments)    Reaction not recalled; PATIENT STATED THAT SHE "WILL NOT TAKE"  . Statins Other (See Comments)    PATIENT STATED THAT SHE "WILL NOT TAKE" THESE    Consultations:  PCCM   Procedures/Studies: Dg Chest 2 View  Result Date: 08/28/2017 CLINICAL DATA:  66 year old female with 7 onset chest pain. Patient is paraplegic. EXAM: CHEST  2 VIEW COMPARISON:  04/22/2017 and multiple priors. FINDINGS: Cardiomediastinal silhouette is stable in size and morphology. The bilateral lungs are clear. No sizable pleural effusions or pneumothorax. There is stable elevation of the right hemidiaphragm. No acute osseous abnormality. IMPRESSION: Stable appearance of the chest without acute intrathoracic process. Electronically Signed   By: Kristopher Oppenheim M.D.   On: 08/28/2017 17:42   Ct Angio Chest Pe W Or Wo Contrast  Result Date: 08/29/2017 CLINICAL DATA:  Shortness of breath with positive D-dimer EXAM: CT ANGIOGRAPHY CHEST WITH CONTRAST TECHNIQUE: Multidetector CT imaging of the chest was performed using the standard protocol during bolus administration of intravenous contrast. Multiplanar CT image reconstructions and MIPs were obtained to evaluate the vascular anatomy. CONTRAST:  53 mL Isovue 370 intravenous COMPARISON:  Radiograph 08/28/2017, CT 04/05/2017, 03/13/2016 FINDINGS: Cardiovascular: Satisfactory opacification of  the pulmonary arteries to the segmental level. No evidence of pulmonary embolism. Aneurysmal dilatation of the ascending aorta, measuring up to 4.3 cm Enlarged pulmonary trunk measuring 3.9 cm. No dissection is seen. Minimal atherosclerotic calcification. Normal heart size. No pericardial effusion. Mediastinum/Nodes: Re- demonstrated multiple hypodense nodules in the thyroid gland with scattered calcification. Midline trachea. No mediastinal adenopathy. Esophagus within normal limits Lungs/Pleura: No consolidation or pleural effusion. Small nodular foci of airspace disease and ground-glass density in the posterior right upper lobe. Right lower lobe peribronchial soft tissue density with nonvisualization of right lower lobe bronchi. Probable low-density material within the right lower lobe bronchi. Partial atelectasis. Upper Abdomen: Slightly enlarged gallbladder containing multiple stones. Stones versus excreted contrast within the bilateral kidneys, measuring up to 14 mm on the left, incompletely visualized. Musculoskeletal: Stable mild  superior endplate deformity at T5, T6 and T8. Review of the MIP images confirms the above findings. IMPRESSION: 1. Negative for acute pulmonary embolism or aortic dissection. Enlarged pulmonary artery, suggesting pulmonary hypertension. 2. Ascending aortic aneurysm measuring up to 4.3 cm. Recommend annual imaging followup by CTA or MRA. This recommendation follows 2010 ACCF/AHA/AATS/ACR/ASA/SCA/SCAI/SIR/STS/SVM Guidelines for the Diagnosis and Management of Patients with Thoracic Aortic Disease. Circulation. 2010; 121: A193-X902 3. Mild nodular ground-glass densities in the right upper lobe may reflect small foci of respiratory infection 4. Probable mucous plugging within the right lower lobe bronchi 5. Gallstones with dilated gallbladder 6. Multiple thyroid nodules, consider correlation with nonemergent thyroid ultrasound Aortic Atherosclerosis (ICD10-I70.0). Electronically Signed    By: Donavan Foil M.D.   On: 08/29/2017 00:51   Dg Chest Port 1 View  Result Date: 09/01/2017 CLINICAL DATA:  Pneumonia. EXAM: PORTABLE CHEST 1 VIEW COMPARISON:  08/31/2017. FINDINGS: Cardiomegaly. Overlying telemetry leads, but no support tubes or lines. Chronic RIGHT hemidiaphragm elevation. Improved retrocardiac density, possible clearing atelectasis or infiltrate. Stable to minimally increased RIGHT perihilar opacity. No effusion or pneumothorax. Bones unremarkable. IMPRESSION: Slight improvement aeration, particularly at the LEFT base. Stable to minimally increased RIGHT perihilar opacity, continued surveillance warranted. Electronically Signed   By: Staci Righter M.D.   On: 09/01/2017 07:15   Dg Chest Port 1 View  Result Date: 08/31/2017 CLINICAL DATA:  Hypoxia EXAM: PORTABLE CHEST 1 VIEW COMPARISON:  08/30/2017 FINDINGS: Elevation of the right hemidiaphragm. Mild cardiomegaly. Left perihilar and lower lobe airspace opacities again noted, unchanged. Right base atelectasis. No visible effusions. IMPRESSION: No significant change since prior study. Electronically Signed   By: Rolm Baptise M.D.   On: 08/31/2017 07:31   Dg Chest Port 1 View  Result Date: 08/30/2017 CLINICAL DATA:  Shortness of breath. EXAM: PORTABLE CHEST 1 VIEW COMPARISON:  Radiograph 2 days prior 08/28/2017, CT yesterday. FINDINGS: Borderline cardiomegaly. Development of left perihilar opacities from CT yesterday. Compressive atelectasis at the right lung base secondary to elevated hemidiaphragm. Mild vascular congestion. No definite pleural fluid or pneumothorax. IMPRESSION: Development of left perihilar opacities from CT yesterday, may be pulmonary edema, infectious or atelectasis. Elevated right hemidiaphragm with adjacent basilar atelectasis. Electronically Signed   By: Jeb Levering M.D.   On: 08/30/2017 05:30   Dg Swallowing Func-speech Pathology  Result Date: 09/01/2017 Objective Swallowing Evaluation: Type of Study:  MBS-Modified Barium Swallow Study  Patient Details Name: Deysi Soldo MRN: [id number] Date of Birth: 02-13-1951 Today's Date: 09/01/2017 Time: SLP Start Time (ACUTE ONLY): 1016 -SLP Stop Time (ACUTE ONLY): 1031 SLP Time Calculation (min) (ACUTE ONLY): 15 min Past Medical History: Past Medical History: Diagnosis Date . Acute on chronic systolic CHF (congestive heart failure) ([place])  . Chronic heart failure ([place])  . Chronic indwelling Foley catheter   last changed few days ago . Diabetes mellitus without complication ([place])  . Enlarged thoracic aorta ([place]) 03/14/2016 . H/O paraplegia   ascending paralysis unclear etiology . Hypertension  . Lumbar spondylosis  . Morbid obesity ([place])  . OSA on CPAP  . Sacral decubitus ulcer, stage IV [facility])  Past Surgical History: Past Surgical History: Procedure Laterality Date . JOINT REPLACEMENT Right   Hip . TONSILLECTOMY   . VAGINAL HYSTERECTOMY   HPI: Pt is a 66 year old paraplegic with a chronic indwelling Foley who presented on 11/29 with transient chest pain followed by chills and chest congestion. She was admitted for possible sepsis from UTI versus sacral wound, although there is also concern for URI or  early PNA. On 11/30, pt also desaturated, requiring intermittent NRB and transfer to ICU. PMH is also significant forparaplegia, neurogenic bladder, wheelchair-bound, chronic indwelling Foley catheter, sacral decubitus ulcer, morbid obesity, HTN, type II DM, sacral osteomyelitis, chronicCHF, OSA.  Subjective: pt denies much coughing during PO intake Assessment / Plan / Recommendation CHL IP CLINICAL IMPRESSIONS 09/01/2017 Clinical Impression Pt has a mild-moderate oropharyngeal dysphagia with oral phase functional with liquids but mildly prolonged with solids, particularly with mastication. She has reduced anterior hyolaryngeal movement, base of tongue retraction, and timing for airway closure, which results in consistent penetration of thin liquids. Smaller bolus sizes and  attempted chin tuck does not increase airway protection - in fact, a chin tuck results in deeper penetration to the true vocal folds. She does not elicit a spontaneous cough reflex until thin liquid reaches below the true vocal folds, and her cough is so weak that it does not appear to move the aspirates. Cued cough/throat clear with more shallow penetration is still too weak to clear her airway. Moderate residue remains after the swallow with solids, which is reduced with a second reflexive but delayed swallow. Given her acute issues including decreased respiratory status, recommend Dys 3 diet and nectar thick liquids with fair potential for advancement as her overall strength and strength of cough improve.  SLP Visit Diagnosis Dysphagia, oropharyngeal phase (R13.12) Attention and concentration deficit following -- Frontal lobe and executive function deficit following -- Impact on safety and function Mild aspiration risk   CHL IP TREATMENT RECOMMENDATION 09/01/2017 Treatment Recommendations Therapy as outlined in treatment plan below   Prognosis 09/01/2017 Prognosis for Safe Diet Advancement Fair Barriers to Reach Goals -- Barriers/Prognosis Comment -- CHL IP DIET RECOMMENDATION 09/01/2017 SLP Diet Recommendations Dysphagia 3 (Mech soft) solids;Nectar thick liquid Liquid Administration via Cup;Straw Medication Administration Whole meds with puree Compensations Slow rate;Small sips/bites Postural Changes Seated upright at 90 degrees   CHL IP OTHER RECOMMENDATIONS 09/01/2017 Recommended Consults -- Oral Care Recommendations Oral care BID Other Recommendations Order thickener from pharmacy;Prohibited food (jello, ice cream, thin soups);Remove water pitcher   CHL IP FOLLOW UP RECOMMENDATIONS 09/01/2017 Follow up Recommendations (No Data)   CHL IP FREQUENCY AND DURATION 09/01/2017 Speech Therapy Frequency (ACUTE ONLY) min 2x/week Treatment Duration 2 weeks      CHL IP ORAL PHASE 09/01/2017 Oral Phase Impaired Oral - Pudding  Teaspoon -- Oral - Pudding Cup -- Oral - Honey Teaspoon -- Oral - Honey Cup -- Oral - Nectar Teaspoon -- Oral - Nectar Cup -- Oral - Nectar Straw WFL Oral - Thin Teaspoon -- Oral - Thin Cup WFL Oral - Thin Straw WFL Oral - Puree WFL Oral - Mech Soft Impaired mastication Oral - Regular -- Oral - Multi-Consistency -- Oral - Pill -- Oral Phase - Comment --  CHL IP PHARYNGEAL PHASE 09/01/2017 Pharyngeal Phase Impaired Pharyngeal- Pudding Teaspoon -- Pharyngeal -- Pharyngeal- Pudding Cup -- Pharyngeal -- Pharyngeal- Honey Teaspoon -- Pharyngeal -- Pharyngeal- Honey Cup -- Pharyngeal -- Pharyngeal- Nectar Teaspoon -- Pharyngeal -- Pharyngeal- Nectar Cup -- Pharyngeal -- Pharyngeal- Nectar Straw Reduced tongue base retraction;Reduced anterior laryngeal mobility;Reduced airway/laryngeal closure;Pharyngeal residue - valleculae Pharyngeal -- Pharyngeal- Thin Teaspoon -- Pharyngeal -- Pharyngeal- Thin Cup Reduced tongue base retraction;Reduced anterior laryngeal mobility;Reduced airway/laryngeal closure;Pharyngeal residue - valleculae;Penetration/Aspiration during swallow Pharyngeal Material enters airway, CONTACTS cords and not ejected out Pharyngeal- Thin Straw Reduced tongue base retraction;Reduced anterior laryngeal mobility;Reduced airway/laryngeal closure;Pharyngeal residue - valleculae;Penetration/Aspiration during swallow Pharyngeal Material enters airway, passes BELOW cords and not  ejected out despite cough attempt by patient Pharyngeal- Puree Reduced tongue base retraction;Reduced anterior laryngeal mobility;Reduced airway/laryngeal closure;Pharyngeal residue - valleculae Pharyngeal -- Pharyngeal- Mechanical Soft Reduced tongue base retraction;Reduced anterior laryngeal mobility;Reduced airway/laryngeal closure;Pharyngeal residue - valleculae Pharyngeal -- Pharyngeal- Regular -- Pharyngeal -- Pharyngeal- Multi-consistency -- Pharyngeal -- Pharyngeal- Pill -- Pharyngeal -- Pharyngeal Comment --  CHL IP CERVICAL  ESOPHAGEAL PHASE 09/01/2017 Cervical Esophageal Phase WFL Pudding Teaspoon -- Pudding Cup -- Honey Teaspoon -- Honey Cup -- Nectar Teaspoon -- Nectar Cup -- Nectar Straw -- Thin Teaspoon -- Thin Cup -- Thin Straw -- Puree -- Mechanical Soft -- Regular -- Multi-consistency -- Pill -- Cervical Esophageal Comment -- No flowsheet data found. Germain Osgood 09/01/2017, 12:03 PM  Germain Osgood, M.A. CCC-SLP [phone]               Subjective: Patient seen and examined at bedside.  She denies any overnight fever, nausea or vomiting.  She feels better enough to go home today.  Discharge Exam: Vitals:   09/02/17 2353 09/03/17 0400  BP: 102/68 113/65  Pulse: 81 90  Resp: (!) 21 18  Temp: 98.4 F (36.9 C) 98.2 F (36.8 C)  SpO2: 97% 94%   Vitals:   09/02/17 2100 09/02/17 2200 09/02/17 2353 09/03/17 0400  BP: 115/70 116/75 102/68 113/65  Pulse: 89 86 81 90  Resp: (!) 26 (!) 22 (!) 21 18  Temp:   98.4 F (36.9 C) 98.2 F (36.8 C)  TempSrc:   Oral Oral  SpO2: 98% 98% 97% 94%  Weight:      Height:        General: Pt is alert, awake, not in acute distress.  Looks older than stated age Cardiovascular: Rate controlled, S1/S2 + Respiratory: Bilateral decreased breath sounds at bases  abdominal: Soft, NT, ND, bowel sounds +, left upper quadrant ostomy present Extremities: no edema, no cyanosis    The results of significant diagnostics from this hospitalization (including imaging, microbiology, ancillary and laboratory) are listed below for reference.     Microbiology: Recent Results (from the past 240 hour(s))  Culture, blood (routine x 2)     Status: Abnormal   Collection Time: 08/28/17  4:54 PM  Result Value Ref Range Status   Specimen Description BLOOD BLOOD LEFT FOREARM  Final   Special Requests   Final    Blood Culture adequate volume BOTTLES DRAWN AEROBIC AND ANAEROBIC   Culture  Setup Time   Final    GRAM POSITIVE COCCI IN CLUSTERS ANAEROBIC BOTTLE ONLY CRITICAL  RESULT CALLED TO, READ BACK BY AND VERIFIED WITH: V BRYK PHARMD [id number] 08/30/17 A BROWNING    Culture (A)  Final    STAPHYLOCOCCUS SPECIES (COAGULASE NEGATIVE) THE SIGNIFICANCE OF ISOLATING THIS ORGANISM FROM A SINGLE SET OF BLOOD CULTURES WHEN MULTIPLE SETS ARE DRAWN IS UNCERTAIN. PLEASE NOTIFY THE MICROBIOLOGY DEPARTMENT WITHIN ONE WEEK IF SPECIATION AND SENSITIVITIES ARE REQUIRED.    Report Status 08/31/2017 FINAL  Final  Blood Culture ID Panel (Reflexed)     Status: Abnormal   Collection Time: 08/28/17  4:54 PM  Result Value Ref Range Status   Enterococcus species NOT DETECTED NOT DETECTED Final   Listeria monocytogenes NOT DETECTED NOT DETECTED Final   Staphylococcus species DETECTED (A) NOT DETECTED Final    Comment: Methicillin (oxacillin) susceptible coagulase negative staphylococcus. Possible blood culture contaminant (unless isolated from more than one blood culture draw or clinical case suggests pathogenicity). No antibiotic treatment is indicated for blood  culture contaminants. CRITICAL  RESULT CALLED TO, READ BACK BY AND VERIFIED WITH: V BRYK PHARMD [id number] 08/30/17 A BROWNING    Staphylococcus aureus NOT DETECTED NOT DETECTED Final   Methicillin resistance NOT DETECTED NOT DETECTED Final   Streptococcus species NOT DETECTED NOT DETECTED Final   Streptococcus agalactiae NOT DETECTED NOT DETECTED Final   Streptococcus pneumoniae NOT DETECTED NOT DETECTED Final   Streptococcus pyogenes NOT DETECTED NOT DETECTED Final   Acinetobacter baumannii NOT DETECTED NOT DETECTED Final   Enterobacteriaceae species NOT DETECTED NOT DETECTED Final   Enterobacter cloacae complex NOT DETECTED NOT DETECTED Final   Escherichia coli NOT DETECTED NOT DETECTED Final   Klebsiella oxytoca NOT DETECTED NOT DETECTED Final   Klebsiella pneumoniae NOT DETECTED NOT DETECTED Final   Proteus species NOT DETECTED NOT DETECTED Final   Serratia marcescens NOT DETECTED NOT DETECTED Final   Haemophilus influenzae  NOT DETECTED NOT DETECTED Final   Neisseria meningitidis NOT DETECTED NOT DETECTED Final   Pseudomonas aeruginosa NOT DETECTED NOT DETECTED Final   Candida albicans NOT DETECTED NOT DETECTED Final   Candida glabrata NOT DETECTED NOT DETECTED Final   Candida krusei NOT DETECTED NOT DETECTED Final   Candida parapsilosis NOT DETECTED NOT DETECTED Final   Candida tropicalis NOT DETECTED NOT DETECTED Final  Urine culture     Status: Abnormal   Collection Time: 08/28/17  6:14 PM  Result Value Ref Range Status   Specimen Description URINE, CATHETERIZED  Final   Special Requests NONE  Final   Culture MULTIPLE SPECIES PRESENT, SUGGEST RECOLLECTION (A)  Final   Report Status 08/29/2017 FINAL  Final  Culture, blood (routine x 2)     Status: None   Collection Time: 08/28/17  8:37 PM  Result Value Ref Range Status   Specimen Description BLOOD RIGHT ANTECUBITAL  Final   Special Requests   Final    Blood Culture adequate volume BOTTLES DRAWN AEROBIC AND ANAEROBIC   Culture NO GROWTH 5 DAYS  Final   Report Status 09/02/2017 FINAL  Final  Culture, blood (routine x 2)     Status: None (Preliminary result)   Collection Time: 08/29/17  2:48 PM  Result Value Ref Range Status   Specimen Description BLOOD LEFT HAND  Final   Special Requests   Final    BOTTLES DRAWN AEROBIC AND ANAEROBIC Blood Culture adequate volume   Culture NO GROWTH 4 DAYS  Final   Report Status PENDING  Incomplete  Culture, blood (routine x 2)     Status: None (Preliminary result)   Collection Time: 08/29/17  2:56 PM  Result Value Ref Range Status   Specimen Description BLOOD RIGHT HAND  Final   Special Requests IN PEDIATRIC BOTTLE Blood Culture adequate volume  Final   Culture NO GROWTH 4 DAYS  Final   Report Status PENDING  Incomplete  Respiratory Panel by PCR     Status: None   Collection Time: 08/30/17  9:03 AM  Result Value Ref Range Status   Adenovirus NOT DETECTED NOT DETECTED Final   Coronavirus 229E NOT DETECTED NOT  DETECTED Final   Coronavirus HKU1 NOT DETECTED NOT DETECTED Final   Coronavirus NL63 NOT DETECTED NOT DETECTED Final   Coronavirus OC43 NOT DETECTED NOT DETECTED Final   Metapneumovirus NOT DETECTED NOT DETECTED Final   Rhinovirus / Enterovirus NOT DETECTED NOT DETECTED Final   Influenza A NOT DETECTED NOT DETECTED Final   Influenza B NOT DETECTED NOT DETECTED Final   Parainfluenza Virus 1 NOT DETECTED NOT DETECTED Final  Parainfluenza Virus 2 NOT DETECTED NOT DETECTED Final   Parainfluenza Virus 3 NOT DETECTED NOT DETECTED Final   Parainfluenza Virus 4 NOT DETECTED NOT DETECTED Final   Respiratory Syncytial Virus NOT DETECTED NOT DETECTED Final   Bordetella pertussis NOT DETECTED NOT DETECTED Final   Chlamydophila pneumoniae NOT DETECTED NOT DETECTED Final   Mycoplasma pneumoniae NOT DETECTED NOT DETECTED Final  MRSA PCR Screening     Status: None   Collection Time: 08/30/17  8:34 PM  Result Value Ref Range Status   MRSA by PCR NEGATIVE NEGATIVE Final    Comment:        The GeneXpert MRSA Assay (FDA approved for NASAL specimens only), is one component of a comprehensive MRSA colonization surveillance program. It is not intended to diagnose MRSA infection nor to guide or monitor treatment for MRSA infections.      Labs: BNP (last 3 results) Recent Labs    04/12/17 0334 08/31/17 0507  BNP 1,317.3* 72.5   Basic Metabolic Panel: Recent Labs  Lab 08/30/17 0402 08/31/17 0507 09/01/17 0306 09/02/17 0343 09/03/17 0344  NA 133* 136 135 133* 136  K 3.5 3.2* 3.7 3.4* 4.1  CL 105 105 105 99* 103  CO2 22 23 22 23 23   GLUCOSE 101* 92 91 96 84  BUN 25* 26* 28* 24* 24*  CREATININE 0.63 0.60 0.56 0.60 0.52  CALCIUM 10.9* 11.1* 11.2* 11.7* 11.6*   Liver Function Tests: No results for input(s): AST, ALT, ALKPHOS, BILITOT, PROT, ALBUMIN in the last 168 hours. No results for input(s): LIPASE, AMYLASE in the last 168 hours. No results for input(s): AMMONIA in the last 168  hours. CBC: Recent Labs  Lab 08/30/17 0402 08/31/17 0507 09/01/17 0306 09/02/17 0343 09/03/17 0344  WBC 16.1* 13.6* 9.1 9.6 8.7  NEUTROABS 10.9* 9.0* 5.5 5.2 4.6  HGB 8.4* 8.3* 8.0* 9.0* 8.3*  HCT 28.6* 28.5* 27.2* 31.2* 27.7*  MCV 73.0* 72.9* 72.1* 72.4* 73.5*  PLT 350 335 317 371 372   Cardiac Enzymes: Recent Labs  Lab 08/28/17 2225 08/29/17 0328 08/29/17 0735  TROPONINI <0.03 <0.03 <0.03   BNP: Invalid input(s): POCBNP CBG: Recent Labs  Lab 09/01/17 1808 09/01/17 2329 09/02/17 0752 09/02/17 1131 09/03/17 0750  GLUCAP 101* 93 85 90 85   D-Dimer No results for input(s): DDIMER in the last 72 hours. Hgb A1c No results for input(s): HGBA1C in the last 72 hours. Lipid Profile No results for input(s): CHOL, HDL, LDLCALC, TRIG, CHOLHDL, LDLDIRECT in the last 72 hours. Thyroid function studies No results for input(s): TSH, T4TOTAL, T3FREE, THYROIDAB in the last 72 hours.  Invalid input(s): FREET3 Anemia work up No results for input(s): VITAMINB12, FOLATE, FERRITIN, TIBC, IRON, RETICCTPCT in the last 72 hours. Urinalysis    Component Value Date/Time   COLORURINE YELLOW 08/28/2017 1807   APPEARANCEUR TURBID (A) 08/28/2017 1807   LABSPEC 1.011 08/28/2017 1807   PHURINE 6.0 08/28/2017 1807   GLUCOSEU NEGATIVE 08/28/2017 1807   HGBUR MODERATE (A) 08/28/2017 1807   BILIRUBINUR NEGATIVE 08/28/2017 1807   KETONESUR 5 (A) 08/28/2017 1807   PROTEINUR 100 (A) 08/28/2017 1807   NITRITE POSITIVE (A) 08/28/2017 1807   LEUKOCYTESUR LARGE (A) 08/28/2017 1807   Sepsis Labs Invalid input(s): PROCALCITONIN,  WBC,  LACTICIDVEN Microbiology Recent Results (from the past 240 hour(s))  Culture, blood (routine x 2)     Status: Abnormal   Collection Time: 08/28/17  4:54 PM  Result Value Ref Range Status   Specimen Description BLOOD BLOOD LEFT  FOREARM  Final   Special Requests   Final    Blood Culture adequate volume BOTTLES DRAWN AEROBIC AND ANAEROBIC   Culture  Setup Time    Final    GRAM POSITIVE COCCI IN CLUSTERS ANAEROBIC BOTTLE ONLY CRITICAL RESULT CALLED TO, READ BACK BY AND VERIFIED WITH: V BRYK PHARMD 0149 08/30/17 A BROWNING    Culture (A)  Final    STAPHYLOCOCCUS SPECIES (COAGULASE NEGATIVE) THE SIGNIFICANCE OF ISOLATING THIS ORGANISM FROM A SINGLE SET OF BLOOD CULTURES WHEN MULTIPLE SETS ARE DRAWN IS UNCERTAIN. PLEASE NOTIFY THE MICROBIOLOGY DEPARTMENT WITHIN ONE WEEK IF SPECIATION AND SENSITIVITIES ARE REQUIRED.    Report Status 08/31/2017 FINAL  Final  Blood Culture ID Panel (Reflexed)     Status: Abnormal   Collection Time: 08/28/17  4:54 PM  Result Value Ref Range Status   Enterococcus species NOT DETECTED NOT DETECTED Final   Listeria monocytogenes NOT DETECTED NOT DETECTED Final   Staphylococcus species DETECTED (A) NOT DETECTED Final    Comment: Methicillin (oxacillin) susceptible coagulase negative staphylococcus. Possible blood culture contaminant (unless isolated from more than one blood culture draw or clinical case suggests pathogenicity). No antibiotic treatment is indicated for blood  culture contaminants. CRITICAL RESULT CALLED TO, READ BACK BY AND VERIFIED WITH: V BRYK PHARMD 0149 08/30/17 A BROWNING    Staphylococcus aureus NOT DETECTED NOT DETECTED Final   Methicillin resistance NOT DETECTED NOT DETECTED Final   Streptococcus species NOT DETECTED NOT DETECTED Final   Streptococcus agalactiae NOT DETECTED NOT DETECTED Final   Streptococcus pneumoniae NOT DETECTED NOT DETECTED Final   Streptococcus pyogenes NOT DETECTED NOT DETECTED Final   Acinetobacter baumannii NOT DETECTED NOT DETECTED Final   Enterobacteriaceae species NOT DETECTED NOT DETECTED Final   Enterobacter cloacae complex NOT DETECTED NOT DETECTED Final   Escherichia coli NOT DETECTED NOT DETECTED Final   Klebsiella oxytoca NOT DETECTED NOT DETECTED Final   Klebsiella pneumoniae NOT DETECTED NOT DETECTED Final   Proteus species NOT DETECTED NOT DETECTED Final    Serratia marcescens NOT DETECTED NOT DETECTED Final   Haemophilus influenzae NOT DETECTED NOT DETECTED Final   Neisseria meningitidis NOT DETECTED NOT DETECTED Final   Pseudomonas aeruginosa NOT DETECTED NOT DETECTED Final   Candida albicans NOT DETECTED NOT DETECTED Final   Candida glabrata NOT DETECTED NOT DETECTED Final   Candida krusei NOT DETECTED NOT DETECTED Final   Candida parapsilosis NOT DETECTED NOT DETECTED Final   Candida tropicalis NOT DETECTED NOT DETECTED Final  Urine culture     Status: Abnormal   Collection Time: 08/28/17  6:14 PM  Result Value Ref Range Status   Specimen Description URINE, CATHETERIZED  Final   Special Requests NONE  Final   Culture MULTIPLE SPECIES PRESENT, SUGGEST RECOLLECTION (A)  Final   Report Status 08/29/2017 FINAL  Final  Culture, blood (routine x 2)     Status: None   Collection Time: 08/28/17  8:37 PM  Result Value Ref Range Status   Specimen Description BLOOD RIGHT ANTECUBITAL  Final   Special Requests   Final    Blood Culture adequate volume BOTTLES DRAWN AEROBIC AND ANAEROBIC   Culture NO GROWTH 5 DAYS  Final   Report Status 09/02/2017 FINAL  Final  Culture, blood (routine x 2)     Status: None (Preliminary result)   Collection Time: 08/29/17  2:48 PM  Result Value Ref Range Status   Specimen Description BLOOD LEFT HAND  Final   Special Requests   Final  BOTTLES DRAWN AEROBIC AND ANAEROBIC Blood Culture adequate volume   Culture NO GROWTH 4 DAYS  Final   Report Status PENDING  Incomplete  Culture, blood (routine x 2)     Status: None (Preliminary result)   Collection Time: 08/29/17  2:56 PM  Result Value Ref Range Status   Specimen Description BLOOD RIGHT HAND  Final   Special Requests IN PEDIATRIC BOTTLE Blood Culture adequate volume  Final   Culture NO GROWTH 4 DAYS  Final   Report Status PENDING  Incomplete  Respiratory Panel by PCR     Status: None   Collection Time: 08/30/17  9:03 AM  Result Value Ref Range Status    Adenovirus NOT DETECTED NOT DETECTED Final   Coronavirus 229E NOT DETECTED NOT DETECTED Final   Coronavirus HKU1 NOT DETECTED NOT DETECTED Final   Coronavirus NL63 NOT DETECTED NOT DETECTED Final   Coronavirus OC43 NOT DETECTED NOT DETECTED Final   Metapneumovirus NOT DETECTED NOT DETECTED Final   Rhinovirus / Enterovirus NOT DETECTED NOT DETECTED Final   Influenza A NOT DETECTED NOT DETECTED Final   Influenza B NOT DETECTED NOT DETECTED Final   Parainfluenza Virus 1 NOT DETECTED NOT DETECTED Final   Parainfluenza Virus 2 NOT DETECTED NOT DETECTED Final   Parainfluenza Virus 3 NOT DETECTED NOT DETECTED Final   Parainfluenza Virus 4 NOT DETECTED NOT DETECTED Final   Respiratory Syncytial Virus NOT DETECTED NOT DETECTED Final   Bordetella pertussis NOT DETECTED NOT DETECTED Final   Chlamydophila pneumoniae NOT DETECTED NOT DETECTED Final   Mycoplasma pneumoniae NOT DETECTED NOT DETECTED Final  MRSA PCR Screening     Status: None   Collection Time: 08/30/17  8:34 PM  Result Value Ref Range Status   MRSA by PCR NEGATIVE NEGATIVE Final    Comment:        The GeneXpert MRSA Assay (FDA approved for NASAL specimens only), is one component of a comprehensive MRSA colonization surveillance program. It is not intended to diagnose MRSA infection nor to guide or monitor treatment for MRSA infections.      Time coordinating discharge: 35 minutes  SIGNED:   Aline August, MD  [facility] 09/03/2017, 9:49 AM Pager: [phone]  If 7PM-7AM, please contact night-coverage [email or web address] Password [email or web address]

## 2017-09-11 ENCOUNTER — Telehealth: Payer: Self-pay

## 2017-09-11 NOTE — Telephone Encounter (Signed)
SENT NOTES TO SCHEDULING 

## 2017-10-06 ENCOUNTER — Ambulatory Visit: Payer: BLUE CROSS/BLUE SHIELD | Admitting: Physician Assistant

## 2017-10-06 NOTE — Progress Notes (Deleted)
Cardiology Office Note    Date:  10/06/2017   ID:  Sharon Warner, DOB 1951-01-18, MRN [id number]  PCP:  Sharon Amel, MD  Cardiologist:  Dr. Acie Warner  Chief Complaint: Low LVEF  History of Present Illness:   Sharon Warner is a 67 y.o. female with a hx of paraplegia, neurogenic bladder, wheelchair bound, chronic indwelling foley catheter, sacral decubitus ulcer, morbid obesity, HTN. Type II diabetes, sacral osteomyelitis, colostomy bag  The patient was admitted 03/2017 for sepsis 2nd to bacteremia due to possible left si joint septic arthritis with fluids collection. Cardiology followed the patient for acute on chronic combined heart failure and bradycardia. She received IV fluid resuscitation for sepsis and then developed pulmonary edema.  CXR shows mild effusion and vascular congestion but no edema.  She had a 14 second pause on 7/10 while swallowing pills.  She was also noted to be bradycardic with heart rates in the 30s-40s.  Metoprolol was stopped and she was switched to carvedilol, but continued to have pauses. Hypotension and tachycardia likely due to sepsis (Per Dr. Oval Warner) and not related to heart failure. Held spironolactone as well as torsemide due to hypotension. Treated with pressor.  The patient was discharge [facility] 03/30/2017 for hydrotherapy. Consider ischemic work up when she improves.   Admitted 11/29-12/5/18 for sepsis due to UTI (catherer induced) from home. Had hypotension and respiratory distress. Resumed home Toprol.   Here today for follow up.   Past Medical History:  Diagnosis Date  . Acute on chronic systolic CHF (congestive heart failure) ([place])   . Chronic heart failure ([place])   . Chronic indwelling Foley catheter    last changed few days ago  . Diabetes mellitus without complication ([place])   . Enlarged thoracic aorta ([place]) 03/14/2016  . H/O paraplegia    ascending paralysis unclear etiology  . Hypertension   . Lumbar spondylosis   . Morbid obesity  ([place])   . OSA on CPAP   . Sacral decubitus ulcer, stage IV Sharon Warner)     Past Surgical History:  Procedure Laterality Date  . JOINT REPLACEMENT Right    Hip  . TONSILLECTOMY    . VAGINAL HYSTERECTOMY      Current Medications: Prior to Admission medications   Medication Sig Start Date End Date Taking? Authorizing Provider  Ascorbic Acid (VITAMIN C PO) Take 1 tablet by mouth daily.    [provider]  aspirin EC 81 MG tablet Take 81 mg by mouth daily.    [provider]  Biotin 10000 MCG TABS Take 10,000 mcg by mouth daily.    [provider]  Cholecalciferol (VITAMIN D-3) 1000 units CAPS Take 1,000 Units by mouth daily.    [provider]  ciclopirox (PENLAC) 8 % solution Apply 1 application topically See admin instructions. TO BOTH FEET AT BEDTIME: Apply over nail and surrounding skin. Apply daily over previous coat. After seven (7) days, may remove with alcohol and continue cycle.    [provider]  CRANBERRY PO Take 1 capsule by mouth daily.    [provider]  dextromethorphan-guaiFENesin (MUCINEX DM) 30-600 MG 12hr tablet Take 1 tablet by mouth 2 (two) times daily. 09/03/17   Sharon August, MD  ferrous sulfate 325 (65 FE) MG tablet Take 325 mg by mouth daily. 07/23/17   [provider]  fluticasone (FLONASE) 50 MCG/ACT nasal spray Place 1 spray into both nostrils daily. 06/22/17   [provider]  H-CHLOR 12 0.125 % SOLN APPLY  TO SACRUM 3 TIMES A WEEK: CLEAN AREA, PAT DRY, PACK WITH SOAKED GAUZE, COVER 06/23/17   [provider]  Homeopathic Products [facility] COLD REMEDY) TBDP Take 1 tablet by mouth daily as needed (for cold symptoms).    [provider]  HYDROcodone-acetaminophen (NORCO) 7.5-325 MG tablet Take 1 tablet by mouth daily as needed (for severe pain).  08/18/17   [provider]  Lactobacillus TABS Take 1 tablet by mouth 2 (two) times daily.     [provider]  Melatonin  3 MG TABS Take 3 mg by mouth at bedtime as needed (for sleep).    [provider]  Menthol, Topical Analgesic, (BIOFREEZE) 4 % GEL Apply 1 application topically See admin instructions. APPLY 1-3 TIMES A DAY TO PAINFUL SITES    [provider]  metoprolol succinate (TOPROL-XL) 25 MG 24 hr tablet Take 12.5 mg by mouth daily.    [provider]  midodrine (PROAMATINE) 2.5 MG tablet Take 1 tablet (2.5 mg total) by mouth 3 (three) times daily with meals. 04/16/17   Sharon Noble Latif, DO  Multiple Vitamins-Minerals (CENTRUM SILVER 50+WOMEN) TABS Take 1 tablet by mouth daily.    [provider]  naproxen (NAPROSYN) 500 MG tablet Take 500 mg by mouth daily as needed for pain. 06/22/17   [provider]  Olopatadine HCl 0.2 % SOLN Place 1 drop into both eyes 2 (two) times daily as needed (for itching).  06/22/17   [provider]  promethazine (PHENERGAN) 25 MG tablet Take 25 mg by mouth every 6 (six) hours as needed for nausea/vomiting. 06/22/17   [provider]  senna-docusate (SENOKOT-S) 8.6-50 MG tablet Take 1 tablet by mouth at bedtime as needed for mild constipation. 09/03/17   Sharon August, MD  traMADol-acetaminophen (ULTRACET) 37.5-325 MG tablet Take 1 tablet by mouth every 4 (four) hours as needed (for pain).    [provider]  zinc sulfate 220 (50 Zn) MG capsule Take 220 mg by mouth daily.    [provider]    Allergies:   Influenza vaccines; Levaquin [levofloxacin in d5w]; Penicillins; Simvastatin; and Statins   Social History   Socioeconomic History  . Marital status: Married    Spouse name: Sharon Warner  . Number of children: 2  . Years of education: 64  . Highest education level: Not on file  Social Needs  . Financial resource strain: Not on file  . Food insecurity - worry: Not on file  . Food insecurity - inability: Not on file  . Transportation needs - medical: Not on file  . Transportation needs -  non-medical: Not on file  Occupational History  . Not on file  Tobacco Use  . Smoking status: Never Smoker  . Smokeless tobacco: Never Used  Substance and Sexual Activity  . Alcohol use: No  . Drug use: No  . Sexual activity: Not on file  Other Topics Concern  . Not on file  Social History Narrative   Lives at [facility] right now   Patient just moved to [place] from [place].    Does have a cat at home.   Brother and son live locally.   Caffeine use:  1 cup per day   Right-handed     Family History:  The patient's family history includes Diabetes in her maternal grandmother; Heart disease in her maternal grandfather; Hypothyroidism in her mother. ***  ROS:   Please see the history of present illness.  ROS All other systems reviewed and are negative.   PHYSICAL EXAM:   VS:  There were no vitals taken for this visit.   GEN: Well nourished, well developed, in no acute distress  HEENT: normal  Neck: no JVD, carotid bruits, or masses Cardiac: ***RRR; no murmurs, rubs, or gallops,no edema  Respiratory:  clear to auscultation bilaterally, normal work of breathing GI: soft, nontender, nondistended, + BS MS: no deformity or atrophy  Skin: warm and dry, no rash Neuro:  Alert and Oriented x 3, Strength and sensation are intact Psych: euthymic mood, full affect  Wt Readings from Last 3 Encounters:  08/29/17 134 lb 12.8 oz (61.1 kg)  04/16/17 162 lb 4.1 oz (73.6 kg)  08/15/16 199 lb 6 oz (90.4 kg)      Studies/Labs Reviewed:   EKG:  EKG is ordered today.  The ekg ordered today demonstrates ***  Recent Labs: 04/05/2017: ALT 14 04/06/2017: TSH 2.115 04/15/2017: Magnesium 1.9 08/31/2017: B Natriuretic Peptide 49.9 09/03/2017: BUN 24; Creatinine, Ser 0.52; Hemoglobin 8.3; Platelets 372; Potassium 4.1; Sodium 136   Lipid Panel No results found for: CHOL, TRIG, HDL, CHOLHDL, VLDL, LDLCALC, LDLDIRECT  Additional studies/ records that were reviewed today  include:   Echocardiogram: 04/09/2017 Study Conclusions  - Left ventricle: Septal and apical akinesis. Inferior and distal   anterior wall hypokinesis. The cavity size was severely dilated.   Wall thickness was increased in a pattern of moderate LVH.   Systolic function was severely reduced. The estimated ejection   fraction was in the range of 25% to 30%. - Aortic valve: There was trivial regurgitation. - Left atrium: The atrium was mildly dilated. - Atrial septum: No defect or patent foramen ovale was identified. - Pulmonary arteries: PA peak pressure: 33 mm Hg (S). - Pericardium, extracardiac: Small but nearly circumferential   pericardial effusion no tamponade IVC small.    ASSESSMENT & PLAN:    1. Chronic combined CHF    Medication Adjustments/Labs and Tests Ordered: Current medicines are reviewed at length with the patient today.  Concerns regarding medicines are outlined above.  Medication changes, Labs and Tests ordered today are listed in the Patient Instructions below. There are no Patient Instructions on file for this visit.   Jarrett Soho, [place]  10/06/2017 11:58 AM    [facility] [address], [place], [place]  [address] Phone: [phone]; Fax: [phone]

## 2017-10-09 ENCOUNTER — Other Ambulatory Visit: Payer: Self-pay | Admitting: Family Medicine

## 2017-10-09 DIAGNOSIS — E041 Nontoxic single thyroid nodule: Secondary | ICD-10-CM

## 2017-11-06 ENCOUNTER — Ambulatory Visit (INDEPENDENT_AMBULATORY_CARE_PROVIDER_SITE_OTHER): Payer: BLUE CROSS/BLUE SHIELD | Admitting: Neurology

## 2017-11-06 ENCOUNTER — Encounter: Payer: Self-pay | Admitting: Neurology

## 2017-11-06 ENCOUNTER — Encounter (INDEPENDENT_AMBULATORY_CARE_PROVIDER_SITE_OTHER): Payer: Self-pay

## 2017-11-06 VITALS — BP 104/69 | HR 99

## 2017-11-06 DIAGNOSIS — M7501 Adhesive capsulitis of right shoulder: Secondary | ICD-10-CM | POA: Diagnosis not present

## 2017-11-06 DIAGNOSIS — M542 Cervicalgia: Secondary | ICD-10-CM

## 2017-11-06 DIAGNOSIS — G8220 Paraplegia, unspecified: Secondary | ICD-10-CM | POA: Diagnosis not present

## 2017-11-06 DIAGNOSIS — M7502 Adhesive capsulitis of left shoulder: Secondary | ICD-10-CM | POA: Diagnosis not present

## 2017-11-06 NOTE — Patient Instructions (Signed)
We will get Physical therapy for the shoulders and get MRI of the cervical spine.

## 2017-11-06 NOTE — Progress Notes (Signed)
Reason for visit: Paraplegia  Sharon Warner is an 67 y.o. female  History of present illness:  Sharon Warner is a 67 year old right-handed white female with a history of paraplegia, etiology is unclear.  The patient likely had a transverse myelitis that did not improve.  The patient fortunately does not have any pain or discomfort below the level of paralysis, her sensory level is around the T6 level.  The patient has had some numbness in the hands, and weakness with the hands as well.  She has developed increasing problems with pain in the shoulders and neck over time.  She does have some headaches in the left frontotemporal area, not the occipital region.  She has some difficulty opening bottles with the hands.  Within the next 3 weeks she may get a wheelchair that will allow her to sit up and mobilize some.  Currently she is healing from a sacral decubitus, she is followed through the wound center.  [facility] comes out to the house to manage the ostomy for the bowels.  The patient returns to this office for an evaluation.  Past Medical History:  Diagnosis Date  . Acute on chronic systolic CHF (congestive heart failure) ([place])   . Chronic heart failure ([place])   . Chronic indwelling Foley catheter    last changed few days ago  . Diabetes mellitus without complication ([place])   . Enlarged thoracic aorta ([place]) 03/14/2016  . H/O paraplegia    ascending paralysis unclear etiology  . Hypertension   . Lumbar spondylosis   . Morbid obesity ([place])   . OSA on CPAP   . Sacral decubitus ulcer, stage IV [facility])     Past Surgical History:  Procedure Laterality Date  . JOINT REPLACEMENT Right    Hip  . TONSILLECTOMY    . VAGINAL HYSTERECTOMY      Family History  Problem Relation Age of Onset  . Hypothyroidism Mother   . Diabetes Maternal Grandmother   . Heart disease Maternal Grandfather     Social history:  reports that  has never smoked. she has never used smokeless tobacco. She  reports that she does not drink alcohol or use drugs.    Allergies  Allergen Reactions  . Influenza Vaccines Other (See Comments)    Patient lost feeling in her legs and that spread  . Levaquin [Levofloxacin In D5w] Nausea Only  . Penicillins Other (See Comments)    From childhood: Has patient had a PCN reaction causing immediate rash, facial/tongue/throat swelling, SOB or lightheadedness with hypotension: Unknown Has patient had a PCN reaction causing severe rash involving mucus membranes or skin necrosis: Unknown Has patient had a PCN reaction that required hospitalization: No Has patient had a PCN reaction occurring within the last 10 years: No If all of the above answers are "NO", then may proceed with Cephalosporin use.  . Simvastatin Other (See Comments)    Reaction not recalled; PATIENT STATED THAT SHE "WILL NOT TAKE"  . Statins Other (See Comments)    PATIENT STATED THAT SHE "WILL NOT TAKE" THESE    Medications:  Prior to Admission medications   Medication Sig Start Date End Date Taking? Authorizing Provider  Ascorbic Acid (VITAMIN C PO) Take 1 tablet by mouth daily.   Yes [provider]  aspirin EC 81 MG tablet Take 81 mg by mouth daily.   Yes [provider]  cephALEXin (KEFLEX) 500 MG capsule Take 500 mg by mouth 4 (four) times daily.  Yes [provider]  Cholecalciferol (VITAMIN D-3) 1000 units CAPS Take 1,000 Units by mouth daily.   Yes [provider]  ciclopirox (PENLAC) 8 % solution Apply 1 application topically See admin instructions. TO BOTH FEET AT BEDTIME: Apply over nail and surrounding skin. Apply daily over previous coat. After seven (7) days, may remove with alcohol and continue cycle.   Yes [provider]  CRANBERRY PO Take 1 capsule by mouth daily.   Yes [provider]  ferrous sulfate 325 (65 FE) MG tablet Take 325 mg by mouth daily. 07/23/17  Yes [provider]  fluticasone (FLONASE) 50  MCG/ACT nasal spray Place 1 spray into both nostrils daily. 06/22/17  Yes [provider]  furosemide (LASIX) 20 MG tablet Take 20 mg by mouth daily. 11/04/17  Yes [provider]  H-CHLOR 12 0.125 % SOLN APPLY TO SACRUM 3 TIMES A WEEK: CLEAN AREA, PAT DRY, PACK WITH SOAKED GAUZE, [place] 06/23/17  Yes [provider]  HYDROcodone-acetaminophen (NORCO) 7.5-325 MG tablet Take 1 tablet by mouth daily as needed (for severe pain).  08/18/17  Yes [provider]  Lactobacillus TABS Take 1 tablet by mouth 2 (two) times daily.    Yes [provider]  Melatonin 3 MG TABS Take 3 mg by mouth at bedtime as needed (for sleep).   Yes [provider]  Menthol, Topical Analgesic, (BIOFREEZE) 4 % GEL Apply 1 application topically See admin instructions. APPLY 1-3 TIMES A DAY TO PAINFUL SITES   Yes [provider]  metoprolol succinate (TOPROL-XL) 25 MG 24 hr tablet Take 12.5 mg by mouth daily.   Yes [provider]  midodrine (PROAMATINE) 2.5 MG tablet Take 1 tablet (2.5 mg total) by mouth 3 (three) times daily with meals. 04/16/17  Yes Sheikh, Omair Latif, DO  naproxen (NAPROSYN) 500 MG tablet Take 500 mg by mouth daily as needed for pain. 06/22/17  Yes [provider]  Olopatadine HCl 0.2 % SOLN Place 1 drop into both eyes 2 (two) times daily as needed (for itching).  06/22/17  Yes [provider]  potassium chloride (K-DUR) 10 MEQ tablet Take 10 mEq by mouth daily.   Yes [provider]  promethazine (PHENERGAN) 25 MG tablet Take 25 mg by mouth every 6 (six) hours as needed for nausea/vomiting. 06/22/17  Yes [provider]  torsemide (DEMADEX) 10 MG tablet Take 10 mg by mouth daily.   Yes [provider]  traMADol-acetaminophen (ULTRACET) 37.5-325 MG tablet Take 1 tablet by mouth every 4 (four) hours as needed (for pain).   Yes [provider]  zinc sulfate 220 (50 Zn) MG capsule Take 220 mg by  mouth daily.   Yes [provider]  Biotin 10000 MCG TABS Take 10,000 mcg by mouth daily.    [provider]  Multiple Vitamins-Minerals (CENTRUM SILVER 50+WOMEN) TABS Take 1 tablet by mouth daily.    [provider]    ROS:  Out of a complete 14 system review of symptoms, the patient complains only of the following symptoms, and all other reviewed systems are negative.  Appetite change, fatigue, weight loss Or heat intolerance Eye discharge Nausea Insomnia, frequent waking, daytime sleepiness Joint pain Headache Depression, anxiety  Blood pressure 104/69, pulse 99, SpO2 96 %.  Physical Exam  General: The patient is alert and cooperative at the time of the examination.  Neuromuscular: The patient is able to elevate arms bilaterally, she has some restriction of rotational movement  across both shoulders.  Rotation of the shoulders passively results in pain.  Skin: 1-2+ edema below the knees is seen bilaterally.   Neurologic Exam  Mental status: The patient is alert and oriented x 3 at the time of the examination. The patient has apparent normal recent and remote memory, with an apparently normal attention span and concentration ability.   Cranial nerves: Facial symmetry is present. Speech is normal, no aphasia or dysarthria is noted. Extraocular movements are full. Visual fields are full.  Motor: The patient has good strength in the upper extremities.  The patient has no voluntary movement in the lower extremities.  Sensory examination: Soft touch sensation is symmetric on the face and arms, patient has no sensation below the T6 level.  Coordination: The patient has good finger-nose-finger bilaterally.  She is unable form heel shin on either side.  Gait and station: The patient unable to ambulate, she comes in on a stretcher  Reflexes: Deep tendon reflexes are symmetric, somewhat more brisk in the biceps reflexes than in the  triceps.   Assessment/Plan:  1.  Paraplegia, transverse myelitis  2.  Bilateral neck and shoulder discomfort  3.  Bilateral frozen shoulder  The patient has some restriction of rotational movement of the shoulders on both sides.  We will need to get physical and occupational therapy for range of movement exercises and strengthening exercises of the arms and shoulders.  The patient will be set up for MRI of the cervical spine given the increasing discomfort that she is having in the neck and shoulders.  The patient will follow-up in 6 months.  Jill Alexanders MD 11/06/2017 1:00 PM  [facility] [address] [place], [place] [address]  Phone [phone] Fax [phone]

## 2017-11-10 ENCOUNTER — Telehealth: Payer: Self-pay | Admitting: Neurology

## 2017-11-10 NOTE — Telephone Encounter (Signed)
Patient has [facility] faxed order to them and they reach out to the patient to schedule the MRI.

## 2017-11-13 ENCOUNTER — Ambulatory Visit
Admission: RE | Admit: 2017-11-13 | Discharge: 2017-11-13 | Disposition: A | Discharge status: Home / Self Care | Payer: BLUE CROSS/BLUE SHIELD | Source: Ambulatory Visit | Attending: Family Medicine | Admitting: Family Medicine

## 2017-11-13 DIAGNOSIS — E041 Nontoxic single thyroid nodule: Secondary | ICD-10-CM

## 2017-11-18 ENCOUNTER — Telehealth: Payer: Self-pay | Admitting: *Deleted

## 2017-11-18 NOTE — Telephone Encounter (Signed)
Faxed completed/signed HCS form (treatment plan summary form) back to [phone]. Received fax confirmation.

## 2017-11-20 ENCOUNTER — Ambulatory Visit
Admission: RE | Admit: 2017-11-20 | Discharge: 2017-11-20 | Disposition: A | Discharge status: Home / Self Care | Payer: BLUE CROSS/BLUE SHIELD | Source: Ambulatory Visit | Attending: Neurology | Admitting: Neurology

## 2017-11-20 ENCOUNTER — Telehealth: Payer: Self-pay | Admitting: Neurology

## 2017-11-20 ENCOUNTER — Encounter: Payer: Self-pay | Admitting: Physician Assistant

## 2017-11-20 DIAGNOSIS — G8220 Paraplegia, unspecified: Secondary | ICD-10-CM | POA: Diagnosis not present

## 2017-11-20 DIAGNOSIS — M542 Cervicalgia: Secondary | ICD-10-CM

## 2017-11-20 NOTE — Telephone Encounter (Signed)
I called the patient.  The MRI of the cervical spine does not show evidence of spinal cord or nerve root compression, diffuse atrophy of the spinal cord is noted.  The thyroid nodules are a known entity, she has already had ultrasound done.   MRI cervical 11/20/17:  IMPRESSION:  This MRI of the cervical spine without contrast shows the following: 1.   Spinal cord atrophy is noted. 2.   At C5-C6 there is disc bulging and bilateral uncovertebral spurring causing mild foraminal narrowing but no nerve root compression.  3.   There are multiple thyroid nodules, the largest on the right is 1115 mm. Consider ultrasound for better evaluation of the nodules and possible biopsy.

## 2017-11-24 ENCOUNTER — Other Ambulatory Visit: Payer: Self-pay | Admitting: Family Medicine

## 2017-11-24 DIAGNOSIS — E042 Nontoxic multinodular goiter: Secondary | ICD-10-CM

## 2017-12-01 ENCOUNTER — Other Ambulatory Visit (HOSPITAL_COMMUNITY): Payer: Self-pay | Admitting: Family Medicine

## 2017-12-01 DIAGNOSIS — E042 Nontoxic multinodular goiter: Secondary | ICD-10-CM

## 2017-12-08 ENCOUNTER — Other Ambulatory Visit (HOSPITAL_COMMUNITY): Payer: Self-pay | Admitting: Family Medicine

## 2017-12-08 ENCOUNTER — Ambulatory Visit (HOSPITAL_COMMUNITY)
Admission: RE | Admit: 2017-12-08 | Discharge: 2017-12-08 | Disposition: A | Discharge status: Home / Self Care | Payer: BLUE CROSS/BLUE SHIELD | Source: Ambulatory Visit | Attending: Family Medicine | Admitting: Family Medicine

## 2017-12-08 DIAGNOSIS — E042 Nontoxic multinodular goiter: Secondary | ICD-10-CM

## 2017-12-08 MED ORDER — LIDOCAINE HCL (PF) 1 % IJ SOLN
INTRAMUSCULAR | Status: AC
Start: 2017-12-08 — End: 2017-12-08
  Filled 2017-12-08: qty 30

## 2018-02-03 ENCOUNTER — Other Ambulatory Visit (HOSPITAL_COMMUNITY): Payer: Self-pay | Admitting: Family Medicine

## 2018-02-03 DIAGNOSIS — I712 Thoracic aortic aneurysm, without rupture, unspecified: Secondary | ICD-10-CM

## 2018-02-19 ENCOUNTER — Ambulatory Visit (HOSPITAL_COMMUNITY)
Admission: RE | Admit: 2018-02-19 | Discharge: 2018-02-19 | Disposition: A | Discharge status: Home / Self Care | Payer: BLUE CROSS/BLUE SHIELD | Source: Ambulatory Visit | Attending: Family Medicine | Admitting: Family Medicine

## 2018-02-19 ENCOUNTER — Encounter (HOSPITAL_COMMUNITY): Payer: Self-pay

## 2018-02-19 DIAGNOSIS — I712 Thoracic aortic aneurysm, without rupture, unspecified: Secondary | ICD-10-CM

## 2018-02-19 DIAGNOSIS — K8020 Calculus of gallbladder without cholecystitis without obstruction: Secondary | ICD-10-CM | POA: Insufficient documentation

## 2018-02-19 LAB — POCT I-STAT CREATININE: CREATININE: 0.5 mg/dL (ref 0.44–1.00)

## 2018-02-19 MED ORDER — IOHEXOL 300 MG/ML  SOLN
100.0000 mL | Freq: Once | INTRAMUSCULAR | Status: AC | PRN
  Administered 2018-02-19: 75 mL via INTRAVENOUS

## 2018-03-12 ENCOUNTER — Emergency Department (HOSPITAL_COMMUNITY): Payer: Medicare Other

## 2018-03-12 ENCOUNTER — Emergency Department (HOSPITAL_COMMUNITY)
Admission: EM | Admit: 2018-03-12 | Discharge: 2018-03-13 | Disposition: A | Discharge status: Skilled Nursing Facility | Payer: Medicare Other | Source: Home / Self Care | Attending: Emergency Medicine | Admitting: Emergency Medicine

## 2018-03-12 DIAGNOSIS — Z79899 Other long term (current) drug therapy: Secondary | ICD-10-CM | POA: Insufficient documentation

## 2018-03-12 DIAGNOSIS — R591 Generalized enlarged lymph nodes: Secondary | ICD-10-CM | POA: Insufficient documentation

## 2018-03-12 DIAGNOSIS — Z7982 Long term (current) use of aspirin: Secondary | ICD-10-CM | POA: Insufficient documentation

## 2018-03-12 DIAGNOSIS — I5043 Acute on chronic combined systolic (congestive) and diastolic (congestive) heart failure: Secondary | ICD-10-CM | POA: Insufficient documentation

## 2018-03-12 DIAGNOSIS — I10 Essential (primary) hypertension: Secondary | ICD-10-CM

## 2018-03-12 DIAGNOSIS — E119 Type 2 diabetes mellitus without complications: Secondary | ICD-10-CM | POA: Insufficient documentation

## 2018-03-12 DIAGNOSIS — I9589 Other hypotension: Secondary | ICD-10-CM | POA: Diagnosis not present

## 2018-03-12 DIAGNOSIS — K521 Toxic gastroenteritis and colitis: Secondary | ICD-10-CM | POA: Diagnosis not present

## 2018-03-12 LAB — COMPREHENSIVE METABOLIC PANEL
ALBUMIN: 3.3 g/dL — AB (ref 3.5–5.0)
ALK PHOS: 96 U/L (ref 38–126)
ALT: 18 U/L (ref 14–54)
ANION GAP: 9 (ref 5–15)
AST: 27 U/L (ref 15–41)
BILIRUBIN TOTAL: 0.5 mg/dL (ref 0.3–1.2)
BUN: 29 mg/dL — AB (ref 6–20)
CO2: 27 mmol/L (ref 22–32)
Calcium: 11.6 mg/dL — ABNORMAL HIGH (ref 8.9–10.3)
Chloride: 98 mmol/L — ABNORMAL LOW (ref 101–111)
Creatinine, Ser: 0.44 mg/dL (ref 0.44–1.00)
GFR calc Af Amer: >60 mL/min (ref >60)
GLUCOSE: 100 mg/dL — AB (ref 65–99)
Potassium: 3.8 mmol/L (ref 3.5–5.1)
Sodium: 134 mmol/L — ABNORMAL LOW (ref 135–145)
Total Protein: 6.6 g/dL (ref 6.5–8.1)

## 2018-03-12 LAB — I-STAT CG4 LACTIC ACID, ED
LACTIC ACID, VENOUS: 0.89 mmol/L (ref 0.5–1.9)
Lactic Acid, Venous: 1.6 mmol/L (ref 0.5–1.9)

## 2018-03-12 LAB — PROTIME-INR
INR: 1.31
PROTHROMBIN TIME: 16.2 s — AB (ref 11.4–15.2)

## 2018-03-12 LAB — CBC WITH DIFFERENTIAL/PLATELET
BASOS PCT: 0 %
Basophils Absolute: 0 10*3/uL (ref 0.0–0.1)
Eosinophils Absolute: 0 10*3/uL (ref 0.0–0.7)
Eosinophils Relative: 0 %
HCT: 35 % — ABNORMAL LOW (ref 36.0–46.0)
HEMOGLOBIN: 10.3 g/dL — AB (ref 12.0–15.0)
LYMPHS ABS: 3 10*3/uL (ref 0.7–4.0)
LYMPHS PCT: 37 %
MCH: 22 pg — ABNORMAL LOW (ref 26.0–34.0)
MCHC: 29.4 g/dL — AB (ref 30.0–36.0)
MCV: 74.8 fL — ABNORMAL LOW (ref 78.0–100.0)
MONO ABS: 0.6 10*3/uL (ref 0.1–1.0)
MONOS PCT: 7 %
NEUTROS PCT: 56 %
Neutro Abs: 4.5 10*3/uL (ref 1.7–7.7)
Platelets: 311 10*3/uL (ref 150–400)
RBC: 4.68 MIL/uL (ref 3.87–5.11)
RDW: 16.9 % — ABNORMAL HIGH (ref 11.5–15.5)
WBC: 8.1 10*3/uL (ref 4.0–10.5)

## 2018-03-12 MED ORDER — SODIUM CHLORIDE 0.9 % IV BOLUS
500.0000 mL | Freq: Once | INTRAVENOUS | Status: AC
  Administered 2018-03-12: 500 mL via INTRAVENOUS

## 2018-03-12 MED ORDER — CLINDAMYCIN HCL 300 MG PO CAPS: 300.0000 mg | ORAL_CAPSULE | Freq: Three times a day (TID) | ORAL | 0 refills | Status: DC

## 2018-03-12 MED ORDER — IOPAMIDOL (ISOVUE-300) INJECTION 61%
100.0000 mL | Freq: Once | INTRAVENOUS | Status: AC | PRN
  Administered 2018-03-12: 100 mL via INTRAVENOUS

## 2018-03-12 MED ORDER — CLINDAMYCIN HCL 300 MG PO CAPS: 300.0000 mg | ORAL_CAPSULE | Freq: Once | ORAL | Status: DC

## 2018-03-12 MED ORDER — CLINDAMYCIN PHOSPHATE 900 MG/50ML IV SOLN
900.0000 mg | Freq: Once | INTRAVENOUS | Status: AC
Start: 2018-03-12 — End: 2018-03-12
  Administered 2018-03-12: 900 mg via INTRAVENOUS
  Filled 2018-03-12: qty 50

## 2018-03-12 MED ORDER — IOPAMIDOL (ISOVUE-300) INJECTION 61%
INTRAVENOUS | Status: AC
  Filled 2018-03-12: qty 100

## 2018-03-12 MED ORDER — CEFIXIME 400 MG PO CAPS: 400.0000 mg | ORAL_CAPSULE | Freq: Once | ORAL | Status: DC

## 2018-03-12 NOTE — ED Triage Notes (Signed)
Transported by [facility] from home--sent for further evaluation regarding pelvic mass. Pt reports the mass has increased in size and is now warm to touch. VSS.

## 2018-03-12 NOTE — ED Notes (Signed)
Bed: [facility] Expected date:  Expected time:  Means of arrival:  Comments: Rm 24

## 2018-03-12 NOTE — ED Notes (Signed)
ED Provider at bedside. 

## 2018-03-12 NOTE — ED Provider Notes (Signed)
[facility] DEPT Provider Note   CSN: [id number] Arrival date & time: 03/12/18  1140     History   Chief Complaint Chief Complaint  Patient presents with  . Mass    HPI Sharon Warner is a 67 y.o. female.  HPI 67 year old female with history of paraplegia, sacral decubitus ulcer, here with increasing left inguinal mass.  The patient states that she has no sensation below her umbilicus so she does not feel the pain.  She has had a mass in her left inguinal area that has been noticed by nurses in the past.  However, over the last several weeks, she notes that it has become increasingly large.  The nursing staff reportedly thought that it looked more red today so she was sent here for evaluation.  She denies any associated change in her bowel habits.  No nausea or vomiting.  No fevers or chills.  Denies any new trauma to the area.  She states that she is seeing someone at [facility] for her sacral decubitus ulcer and it seems to be healing well.  She denies any fevers or chills.  No change in her appetite.  No other complaints.  No chills or night sweats.   Past Medical History:  Diagnosis Date  . Acute on chronic systolic CHF (congestive heart failure) ([place])   . Chronic heart failure ([place])   . Chronic indwelling Foley catheter    last changed few days ago  . Diabetes mellitus without complication ([place])   . Enlarged thoracic aorta ([place]) 03/14/2016  . H/O paraplegia    ascending paralysis unclear etiology  . Hypertension   . Lumbar spondylosis   . Morbid obesity ([place])   . OSA on CPAP   . Sacral decubitus ulcer, stage IV [facility])     Patient Active Problem List   Diagnosis Date Noted  . Hypoxia   . Hypotension   . Catheter-associated urinary tract infection ([place]) 08/28/2017  . Sacroiliitis ([place]) 04/12/2017  . Streptococcal bacteremia 04/12/2017  . Gram-negative bacteremia 04/12/2017  . Acute on chronic combined systolic and diastolic CHF (congestive  heart failure) ([place])   . Sacral decubitus ulcer, stage IV ([place]) 08/15/2016  . Hypercalcemia 04/18/2016  . Protein-calorie malnutrition ([place]) 04/16/2016  . Multinodular goiter 04/16/2016  . Morbid obesity ([place]) 03/14/2016  . Type 2 diabetes mellitus ([place]) 03/14/2016  . HTN (hypertension) 03/14/2016  . Paraplegia ([place]) 03/14/2016  . Neurogenic bladder 03/14/2016  . Obstructive sleep apnea 03/14/2016  . Microcytic anemia 03/14/2016    Past Surgical History:  Procedure Laterality Date  . JOINT REPLACEMENT Right    Hip  . TONSILLECTOMY    . VAGINAL HYSTERECTOMY       OB History   None      Home Medications    Prior to Admission medications   Medication Sig Start Date End Date Taking? Authorizing Provider  Ascorbic Acid (VITAMIN C PO) Take 1 tablet by mouth daily.   Yes [provider]  aspirin EC 81 MG tablet Take 81 mg by mouth daily.   Yes [provider]  Cholecalciferol (VITAMIN D-3) 1000 units CAPS Take 1,000 Units by mouth daily.   Yes [provider]  ciclopirox (PENLAC) 8 % solution Apply 1 application topically See admin instructions. TO BOTH FEET AT BEDTIME: Apply over nail and surrounding skin. Apply daily over previous coat. After seven (7) days, may remove with alcohol and continue cycle.   Yes [provider]  CRANBERRY PO Take  1 capsule by mouth daily.   Yes [provider]  ferrous sulfate 325 (65 FE) MG tablet Take 325 mg by mouth daily. 07/23/17  Yes [provider]  fluticasone (FLONASE) 50 MCG/ACT nasal spray Place 1 spray into both nostrils daily as needed for allergies.  06/22/17  Yes [provider]  furosemide (LASIX) 20 MG tablet Take 20 mg by mouth daily. 11/04/17  Yes [provider]  H-CHLOR 12 0.125 % SOLN APPLY TO SACRUM 3 TIMES A WEEK: CLEAN AREA, PAT DRY, PACK WITH SOAKED GAUZE, [place] 06/23/17  Yes [provider]  HYDROcodone-acetaminophen (NORCO) 7.5-325 MG tablet Take 1  tablet by mouth daily as needed (for severe pain).  08/18/17  Yes [provider]  Menthol, Topical Analgesic, (BIOFREEZE) 4 % GEL Apply 1 application topically See admin instructions. APPLY 1-3 TIMES A DAY TO PAINFUL SITES   Yes [provider]  metoprolol succinate (TOPROL-XL) 25 MG 24 hr tablet Take 25 mg by mouth daily.    Yes [provider]  potassium chloride (K-DUR) 10 MEQ tablet Take 10 mEq by mouth daily.   Yes [provider]  sertraline (ZOLOFT) 50 MG tablet TAKE 1/2 TABLET BY MOUTH ONCE A DAY FOR 1 WEEK, THEN 1 TAB ONCE A DAY 02/28/18  Yes [provider]  sulfamethoxazole-trimethoprim (BACTRIM DS,SEPTRA DS) 800-160 MG tablet TAKE 1 TABLET BY MOUTH TWICE A DAY FOR 14 DAYS 03/09/18  Yes [provider]  tetrahydrozoline (VISINE) 0.05 % ophthalmic solution Place 1 drop into both eyes 2 (two) times daily as needed.   Yes [provider]  zinc sulfate 220 (50 Zn) MG capsule Take 220 mg by mouth daily.   Yes [provider]  Biotin 10000 MCG TABS Take 10,000 mcg by mouth daily.    [provider]  clindamycin (CLEOCIN) 300 MG capsule Take 1 capsule (300 mg total) by mouth 3 (three) times daily for 7 days. 03/12/18 03/19/18  Duffy Bruce, MD  midodrine (PROAMATINE) 2.5 MG tablet Take 1 tablet (2.5 mg total) by mouth 3 (three) times daily with meals. Patient not taking: Reported on 12/01/2017 04/16/17   Raiford Noble Latif, DO  promethazine (PHENERGAN) 25 MG tablet Take 25 mg by mouth every 6 (six) hours as needed for nausea/vomiting. 06/22/17   [provider]    Family History Family History  Problem Relation Age of Onset  . Hypothyroidism Mother   . Diabetes Maternal Grandmother   . Heart disease Maternal Grandfather     Social History Social History   Tobacco Use  . Smoking status: Never Smoker  . Smokeless tobacco: Never Used  Substance Use Topics  . Alcohol use: No  . Drug use: No      Allergies   Influenza vaccines; Levaquin [levofloxacin in d5w]; Penicillins; Simvastatin; and Statins   Review of Systems Review of Systems  Constitutional: Negative for chills and fever.  HENT: Negative for congestion, rhinorrhea and sore throat.   Eyes: Negative for visual disturbance.  Respiratory: Negative for cough, shortness of breath and wheezing.   Cardiovascular: Negative for chest pain and leg swelling.  Gastrointestinal: Negative for abdominal pain, diarrhea, nausea and vomiting.  Genitourinary: Negative for dysuria, flank pain, vaginal bleeding and vaginal discharge.  Musculoskeletal: Negative for neck pain.  Skin: Positive for rash.  Allergic/Immunologic: Negative for immunocompromised state.  Neurological: Negative for syncope and headaches.  Hematological: Does not bruise/bleed easily.  All other systems reviewed and are negative.    Physical Exam  Updated Vital Signs BP 94/63   Pulse 89   Temp (!) 97.4 F (36.3 C) (Oral)   Resp 16   SpO2 100%   Physical Exam  Constitutional: She is oriented to person, place, and time. She appears well-developed and well-nourished. No distress.  HENT:  Head: Normocephalic and atraumatic.  Eyes: Conjunctivae are normal.  Neck: Neck supple.  Cardiovascular: Normal rate, regular rhythm and normal heart sounds. Exam reveals no friction rub.  No murmur heard. Pulmonary/Chest: Effort normal and breath sounds normal. No respiratory distress. She has no wheezes. She has no rales.  Abdominal: She exhibits no distension.    Colostomy in place, draining soft brown-green stool.  Genitourinary:  Genitourinary Comments: Foley catheter in place, no apparent perineal wounds or injuries.  No drainage.  Musculoskeletal: She exhibits no edema.  Neurological: She is alert and oriented to person, place, and time.  Alert, oriented, in no distress.  Paralyzed bilateral lower extremities.  Skin: Skin is warm. Capillary refill takes less  than 2 seconds.  Stage IV decubitus ulcer without apparent drainage, foul odor, or signs of superinfection.  Psychiatric: She has a normal mood and affect.  Nursing note and vitals reviewed.    ED Treatments / Results  Labs (all labs ordered are listed, but only abnormal results are displayed) Labs Reviewed  CBC WITH DIFFERENTIAL/PLATELET - Abnormal; Notable for the following components:      Result Value   Hemoglobin 10.3 (*)    HCT 35.0 (*)    MCV 74.8 (*)    MCH 22.0 (*)    MCHC 29.4 (*)    RDW 16.9 (*)    All other components within normal limits  COMPREHENSIVE METABOLIC PANEL - Abnormal; Notable for the following components:   Sodium 134 (*)    Chloride 98 (*)    Glucose, Bld 100 (*)    BUN 29 (*)    Calcium 11.6 (*)    Albumin 3.3 (*)    All other components within normal limits  PROTIME-INR - Abnormal; Notable for the following components:   Prothrombin Time 16.2 (*)    All other components within normal limits  I-STAT CG4 LACTIC ACID, ED  I-STAT CG4 LACTIC ACID, ED  I-STAT CG4 LACTIC ACID, ED    EKG None  Radiology Ct Abdomen Pelvis W Contrast  Result Date: 03/12/2018 CLINICAL DATA:  Abdominal distension.  Possible hernia. EXAM: CT ABDOMEN AND PELVIS WITH CONTRAST TECHNIQUE: Multidetector CT imaging of the abdomen and pelvis was performed using the standard protocol following bolus administration of intravenous contrast. CONTRAST:  100 m ISOVUE-300 IOPAMIDOL (ISOVUE-300) INJECTION 61% COMPARISON:  CT pelvis 04/11/2017. FINDINGS: Lower chest: Lung bases are clear. No pleural effusion. Small pericardial effusion noted. Calcific coronary artery disease is seen. Hepatobiliary: Innumerable small stones are seen layering dependently in the gallbladder. No CT evidence of cholecystitis. The liver and biliary tree are unremarkable. Pancreas: Atrophic. No focal lesion or peripancreatic inflammatory change. Spleen: The spleen measures 16.5 cm.  No focal splenic lesion.  Adrenals/Urinary Tract: Innumerable bilateral nonobstructing renal stones are seen. Largest stone is in the upper pole of the left kidney and measures 2.2 cm craniocaudal. No ureteral stone. Bladder is decompressed with a Foley catheter in place. There is a urinary bladder stone measuring approximately 3 cm in diameter which is new since the prior exam. The adrenal glands are unremarkable. Stomach/Bowel: Left lower quadrant colostomy is seen. Colon is otherwise unremarkable. The appendix is not visualized but no evidence of appendicitis is  seen. Stomach and small bowel appear normal. Vascular/Lymphatic: The patient has extensive lymphadenopathy throughout the abdomen and pelvis. Index right paravertebral node on image 44 measures 1.2 by 2.7 cm. Index left para-aortic nodal mass on image 48 measures 3.4 x 3.2 cm. Left pelvic sidewall lymph node which had measured 1.2 by 2.4 cm on the prior CT measures 2.1 x 3.8 cm on image 76 today's examination. Left groin nodal mass which measures approximately 2.2 cm AP on the prior CT today measures 4.5 cm AP on image 83. Reproductive: Status post hysterectomy. No adnexal masses. Other: Diffuse body wall edema is present. Musculoskeletal: The majority of the sacrum is not visualized most consistent with osteolysis related to prior infection. Mild T8, T10 and L2 compression fractures appear remote. Fracture fixation hardware in both hips noted. IMPRESSION: Extensive retroperitoneal lymphadenopathy with marked increase in the size of a left inguinal lymph node consistent with lymphoproliferative disease such as chronic lymphocytic leukemia. The patient's left groin lymph node should be easily amenable to biopsy. Splenomegaly is likely related to lymphoproliferative disease. Multiple bilateral nonobstructing renal stones. Urinary bladder stone is also identified and new since the prior CT. Multiple tiny gallstones without evidence of cholecystitis. Calcific aortic and coronary  atherosclerosis. Body wall edema compatible with anasarca. Electronically Signed   By: Inge Rise M.D.   On: 03/12/2018 14:44    Procedures Procedures (including critical care time)  Medications Ordered in ED Medications  iopamidol (ISOVUE-300) 61 % injection (has no administration in time range)  iopamidol (ISOVUE-300) 61 % injection 100 mL (100 mLs Intravenous Contrast Given 03/12/18 1401)  sodium chloride 0.9 % bolus 500 mL (0 mLs Intravenous Stopped 03/12/18 1909)  clindamycin (CLEOCIN) IVPB 900 mg (0 mg Intravenous Stopped 03/12/18 1715)     Initial Impression / Assessment and Plan / ED Course  I have reviewed the triage vital signs and the nursing notes.  Pertinent labs & imaging results that were available during my care of the patient were reviewed by me and considered in my medical decision making (see chart for details).     67 yo F with PMHx as above including chronic paraplegia here with LLQ/left inguinal mass. On exam, pt has firm soft tissue mass to L inguinal area (? Hernia, LAD, lipoma, less likely abscess). CT, labs pending. Pt is AF, well appearing, otherwise w/o complaints. Sacral decub does not appear infected and she just had it cleaned at her wound clinic.   Lab work reassuring. CBC with mild anemia but normal WBC, normal Plt. CMP at baseline. No change in urine appearance/color/odor, doubt UTI. CT imaging is c/f diffuse, bulky RP and inguinal LAD. History, imaging is c/f CLL or other lymphoid malignancy. Discussed with Dr. Benay Spice of Heme-Onc - pt to be set up for bx and appt within the week. Otherwise, given that staff did notice some mild increase in erythema - though normal WBC, normal LA, no signs of sepsis (SBP 90-100 which is her baseline per records and pt report), will plac eon clinda for possible superimposed lymphadentitis. D/c home.  Final Clinical Impressions(s) / ED Diagnoses   Final diagnoses:  Lymphadenopathy    ED Discharge Orders         Ordered    clindamycin (CLEOCIN) 300 MG capsule  3 times daily     03/12/18 1600       Duffy Bruce, MD 03/12/18 1940

## 2018-03-12 NOTE — Discharge Instructions (Addendum)
As we discussed, your CT scan shows that your swelling is due to enlarged lymph nodes. CLinically, this is concerning for a lymphoma. I've discussed this with Dr. Benay Spice and you should call the number above to set up an appointment for a biopsy.  Otherwise, because there is some mild redness of the area, we'll start an antibiotic to help with possible infection.  Monitor the area for any worsening redness or drainage. Return to the ER with any fever or other concerning symptoms.

## 2018-03-12 NOTE — ED Notes (Signed)
Patient transported to CT 

## 2018-03-12 NOTE — ED Notes (Signed)
Bed: QM21 Expected date:  Expected time:  Means of arrival:  Comments: EMS-pelvic mass

## 2018-03-14 ENCOUNTER — Inpatient Hospital Stay (HOSPITAL_COMMUNITY)
Admission: EM | Admit: 2018-03-14 | Discharge: 2018-03-18 | DRG: 393 | Disposition: A | Discharge status: Home Health | Payer: Medicare Other | Attending: Internal Medicine | Admitting: Internal Medicine

## 2018-03-14 ENCOUNTER — Emergency Department (HOSPITAL_COMMUNITY): Payer: Medicare Other

## 2018-03-14 DIAGNOSIS — Z515 Encounter for palliative care: Secondary | ICD-10-CM | POA: Diagnosis present

## 2018-03-14 DIAGNOSIS — I2510 Atherosclerotic heart disease of native coronary artery without angina pectoris: Secondary | ICD-10-CM | POA: Diagnosis present

## 2018-03-14 DIAGNOSIS — R601 Generalized edema: Secondary | ICD-10-CM | POA: Diagnosis present

## 2018-03-14 DIAGNOSIS — K8020 Calculus of gallbladder without cholecystitis without obstruction: Secondary | ICD-10-CM | POA: Diagnosis present

## 2018-03-14 DIAGNOSIS — I5022 Chronic systolic (congestive) heart failure: Secondary | ICD-10-CM | POA: Diagnosis present

## 2018-03-14 DIAGNOSIS — T83511A Infection and inflammatory reaction due to indwelling urethral catheter, initial encounter: Secondary | ICD-10-CM | POA: Diagnosis present

## 2018-03-14 DIAGNOSIS — E43 Unspecified severe protein-calorie malnutrition: Secondary | ICD-10-CM | POA: Diagnosis present

## 2018-03-14 DIAGNOSIS — L89154 Pressure ulcer of sacral region, stage 4: Secondary | ICD-10-CM | POA: Diagnosis present

## 2018-03-14 DIAGNOSIS — E1169 Type 2 diabetes mellitus with other specified complication: Secondary | ICD-10-CM | POA: Diagnosis present

## 2018-03-14 DIAGNOSIS — D509 Iron deficiency anemia, unspecified: Secondary | ICD-10-CM | POA: Diagnosis present

## 2018-03-14 DIAGNOSIS — I700 Atherosclerosis of aorta: Secondary | ICD-10-CM | POA: Diagnosis present

## 2018-03-14 DIAGNOSIS — G4733 Obstructive sleep apnea (adult) (pediatric): Secondary | ICD-10-CM | POA: Diagnosis present

## 2018-03-14 DIAGNOSIS — K521 Toxic gastroenteritis and colitis: Principal | ICD-10-CM | POA: Diagnosis present

## 2018-03-14 DIAGNOSIS — Y846 Urinary catheterization as the cause of abnormal reaction of the patient, or of later complication, without mention of misadventure at the time of the procedure: Secondary | ICD-10-CM | POA: Diagnosis present

## 2018-03-14 DIAGNOSIS — E46 Unspecified protein-calorie malnutrition: Secondary | ICD-10-CM | POA: Diagnosis present

## 2018-03-14 DIAGNOSIS — R591 Generalized enlarged lymph nodes: Secondary | ICD-10-CM | POA: Diagnosis present

## 2018-03-14 DIAGNOSIS — Z87440 Personal history of urinary (tract) infections: Secondary | ICD-10-CM

## 2018-03-14 DIAGNOSIS — E871 Hypo-osmolality and hyponatremia: Secondary | ICD-10-CM | POA: Diagnosis not present

## 2018-03-14 DIAGNOSIS — I9589 Other hypotension: Secondary | ICD-10-CM

## 2018-03-14 DIAGNOSIS — F329 Major depressive disorder, single episode, unspecified: Secondary | ICD-10-CM | POA: Diagnosis present

## 2018-03-14 DIAGNOSIS — I313 Pericardial effusion (noninflammatory): Secondary | ICD-10-CM | POA: Diagnosis present

## 2018-03-14 DIAGNOSIS — E861 Hypovolemia: Secondary | ICD-10-CM

## 2018-03-14 DIAGNOSIS — E119 Type 2 diabetes mellitus without complications: Secondary | ICD-10-CM

## 2018-03-14 DIAGNOSIS — I10 Essential (primary) hypertension: Secondary | ICD-10-CM | POA: Diagnosis present

## 2018-03-14 DIAGNOSIS — I110 Hypertensive heart disease with heart failure: Secondary | ICD-10-CM | POA: Diagnosis present

## 2018-03-14 DIAGNOSIS — Z79899 Other long term (current) drug therapy: Secondary | ICD-10-CM

## 2018-03-14 DIAGNOSIS — Z7982 Long term (current) use of aspirin: Secondary | ICD-10-CM

## 2018-03-14 DIAGNOSIS — Z96641 Presence of right artificial hip joint: Secondary | ICD-10-CM | POA: Diagnosis present

## 2018-03-14 DIAGNOSIS — K9403 Colostomy malfunction: Secondary | ICD-10-CM | POA: Diagnosis present

## 2018-03-14 DIAGNOSIS — I5023 Acute on chronic systolic (congestive) heart failure: Secondary | ICD-10-CM | POA: Diagnosis present

## 2018-03-14 DIAGNOSIS — N200 Calculus of kidney: Secondary | ICD-10-CM | POA: Diagnosis present

## 2018-03-14 DIAGNOSIS — Z880 Allergy status to penicillin: Secondary | ICD-10-CM

## 2018-03-14 DIAGNOSIS — Z833 Family history of diabetes mellitus: Secondary | ICD-10-CM

## 2018-03-14 DIAGNOSIS — Z9109 Other allergy status, other than to drugs and biological substances: Secondary | ICD-10-CM

## 2018-03-14 DIAGNOSIS — B952 Enterococcus as the cause of diseases classified elsewhere: Secondary | ICD-10-CM | POA: Diagnosis present

## 2018-03-14 DIAGNOSIS — G8220 Paraplegia, unspecified: Secondary | ICD-10-CM | POA: Diagnosis present

## 2018-03-14 DIAGNOSIS — Z888 Allergy status to other drugs, medicaments and biological substances status: Secondary | ICD-10-CM

## 2018-03-14 DIAGNOSIS — N319 Neuromuscular dysfunction of bladder, unspecified: Secondary | ICD-10-CM | POA: Diagnosis present

## 2018-03-14 DIAGNOSIS — Z6825 Body mass index (BMI) 25.0-25.9, adult: Secondary | ICD-10-CM

## 2018-03-14 DIAGNOSIS — N390 Urinary tract infection, site not specified: Secondary | ICD-10-CM | POA: Diagnosis present

## 2018-03-14 DIAGNOSIS — Z887 Allergy status to serum and vaccine status: Secondary | ICD-10-CM

## 2018-03-14 HISTORY — DX: Obstructive sleep apnea (adult) (pediatric): G47.33

## 2018-03-14 HISTORY — DX: Depression, unspecified: F32.A

## 2018-03-14 HISTORY — DX: Type 2 diabetes mellitus without complications: E11.9

## 2018-03-14 HISTORY — DX: Malignant neoplasm of endometrium: C54.1

## 2018-03-14 HISTORY — DX: Major depressive disorder, single episode, unspecified: F32.9

## 2018-03-14 HISTORY — DX: Anxiety disorder, unspecified: F41.9

## 2018-03-14 HISTORY — DX: Anemia, unspecified: D64.9

## 2018-03-14 HISTORY — DX: Cardiac murmur, unspecified: R01.1

## 2018-03-14 LAB — CBC WITH DIFFERENTIAL/PLATELET
ABS IMMATURE GRANULOCYTES: 0.1 10*3/uL (ref 0.0–0.1)
BASOS ABS: 0 10*3/uL (ref 0.0–0.1)
BASOS PCT: 0 %
Eosinophils Absolute: 0 10*3/uL (ref 0.0–0.7)
Eosinophils Relative: 0 %
HCT: 31.6 % — ABNORMAL LOW (ref 36.0–46.0)
Hemoglobin: 9 g/dL — ABNORMAL LOW (ref 12.0–15.0)
IMMATURE GRANULOCYTES: 1 %
Lymphocytes Relative: 37 %
Lymphs Abs: 3.1 10*3/uL (ref 0.7–4.0)
MCH: 21.5 pg — AB (ref 26.0–34.0)
MCHC: 28.5 g/dL — ABNORMAL LOW (ref 30.0–36.0)
MCV: 75.4 fL — ABNORMAL LOW (ref 78.0–100.0)
Monocytes Absolute: 0.4 10*3/uL (ref 0.1–1.0)
Monocytes Relative: 5 %
NEUTROS ABS: 4.7 10*3/uL (ref 1.7–7.7)
NEUTROS PCT: 57 %
PLATELETS: 311 10*3/uL (ref 150–400)
RBC: 4.19 MIL/uL (ref 3.87–5.11)
RDW: 17.2 % — ABNORMAL HIGH (ref 11.5–15.5)
WBC: 8.2 10*3/uL (ref 4.0–10.5)

## 2018-03-14 LAB — COMPREHENSIVE METABOLIC PANEL
ALK PHOS: 88 U/L (ref 38–126)
ALT: 17 U/L (ref 14–54)
ANION GAP: 8 (ref 5–15)
AST: 21 U/L (ref 15–41)
Albumin: 2.8 g/dL — ABNORMAL LOW (ref 3.5–5.0)
BILIRUBIN TOTAL: 0.4 mg/dL (ref 0.3–1.2)
BUN: 22 mg/dL — ABNORMAL HIGH (ref 6–20)
CALCIUM: 10.4 mg/dL — AB (ref 8.9–10.3)
CO2: 23 mmol/L (ref 22–32)
Chloride: 104 mmol/L (ref 101–111)
Creatinine, Ser: 0.5 mg/dL (ref 0.44–1.00)
GFR calc Af Amer: >60 mL/min (ref >60)
GFR calc non Af Amer: >60 mL/min (ref >60)
Glucose, Bld: 93 mg/dL (ref 65–99)
POTASSIUM: 3.8 mmol/L (ref 3.5–5.1)
Sodium: 135 mmol/L (ref 135–145)
TOTAL PROTEIN: 5.7 g/dL — AB (ref 6.5–8.1)

## 2018-03-14 LAB — I-STAT CG4 LACTIC ACID, ED: Lactic Acid, Venous: 0.8 mmol/L (ref 0.5–1.9)

## 2018-03-14 LAB — LIPASE, BLOOD: LIPASE: 19 U/L (ref 11–51)

## 2018-03-14 MED ORDER — SODIUM CHLORIDE 0.9 % IV BOLUS (SEPSIS)
1000.0000 mL | Freq: Once | INTRAVENOUS | Status: AC
  Administered 2018-03-14: 1000 mL via INTRAVENOUS

## 2018-03-14 NOTE — ED Provider Notes (Signed)
[facility] EMERGENCY DEPARTMENT Provider Note   CSN: [id number] Arrival date & time: 03/14/18  2043     History   Chief Complaint Chief Complaint  Patient presents with  . Urinary Frequency    HPI Sharon Warner is a 67 y.o. female.  HPI Patient presents to the emergency room for evaluation of nausea and general malaise.  Patient has a history of multiple medical problems including paraplegia.  The patient has an indwelling Foley catheter and a chronic sacral decubitus ulcer.  Patient states she felt somewhat nauseated today and did not have a very good appetite.  She did not have any vomiting.  She started to have increased liquid stool output into her ostomy bag.  No known fevers.  She has been coughing but does not feel like she can bring anything up.  No rashes.  No chest pain.  No abdominal pain.  Patient spoke to the staff at her living facility and they recommended evaluation in the ED. Past Medical History:  Diagnosis Date  . Acute on chronic systolic CHF (congestive heart failure) ([place])   . Chronic heart failure ([place])   . Chronic indwelling Foley catheter    last changed few days ago  . Diabetes mellitus without complication ([place])   . Enlarged thoracic aorta ([place]) 03/14/2016  . H/O paraplegia    ascending paralysis unclear etiology  . Hypertension   . Lumbar spondylosis   . Morbid obesity ([place])   . OSA on CPAP   . Sacral decubitus ulcer, stage IV [facility])     Patient Active Problem List   Diagnosis Date Noted  . Hypoxia   . Hypotension   . Catheter-associated urinary tract infection ([place]) 08/28/2017  . Sacroiliitis ([place]) 04/12/2017  . Streptococcal bacteremia 04/12/2017  . Gram-negative bacteremia 04/12/2017  . Acute on chronic combined systolic and diastolic CHF (congestive heart failure) ([place])   . Sacral decubitus ulcer, stage IV ([place]) 08/15/2016  . Hypercalcemia 04/18/2016  . Protein-calorie malnutrition ([place]) 04/16/2016  . Multinodular  goiter 04/16/2016  . Morbid obesity ([place]) 03/14/2016  . Type 2 diabetes mellitus ([place]) 03/14/2016  . HTN (hypertension) 03/14/2016  . Paraplegia ([place]) 03/14/2016  . Neurogenic bladder 03/14/2016  . Obstructive sleep apnea 03/14/2016  . Microcytic anemia 03/14/2016    Past Surgical History:  Procedure Laterality Date  . JOINT REPLACEMENT Right    Hip  . TONSILLECTOMY    . VAGINAL HYSTERECTOMY       OB History   None      Home Medications    Prior to Admission medications   Medication Sig Start Date End Date Taking? Authorizing Provider  Ascorbic Acid (VITAMIN C PO) Take 1 tablet by mouth daily.   Yes [provider]  aspirin EC 81 MG tablet Take 81 mg by mouth daily.   Yes [provider]  Carbamide Peroxide (EAR DROPS OT) Place 3 drops in ear(s) daily as needed (Ear Pain).   Yes [provider]  Cholecalciferol (VITAMIN D-3) 1000 units CAPS Take 1,000 Units by mouth daily.   Yes [provider]  ciclopirox (PENLAC) 8 % solution Apply 1 application topically See admin instructions. TO BOTH FEET AT BEDTIME: Apply over nail and surrounding skin. Apply daily over previous coat. After seven (7) days, may remove with alcohol and continue cycle.   Yes [provider]  clindamycin (CLEOCIN) 300 MG capsule Take 1 capsule (300 mg total) by mouth 3 (three) times daily for 7 days. 03/12/18 03/19/18  Yes Duffy Bruce, MD  CRANBERRY PO Take 1 capsule by mouth daily.   Yes [provider]  ferrous sulfate 325 (65 FE) MG tablet Take 325 mg by mouth daily. 07/23/17  Yes [provider]  fluticasone (FLONASE) 50 MCG/ACT nasal spray Place 1 spray into both nostrils daily as needed for allergies.  06/22/17  Yes [provider]  furosemide (LASIX) 20 MG tablet Take 20 mg by mouth daily. 11/04/17  Yes [provider]  HYDROcodone-acetaminophen (NORCO) 7.5-325 MG tablet Take 1 tablet by mouth daily as needed (for severe  pain).  08/18/17  Yes [provider]  Menthol, Topical Analgesic, (BIOFREEZE) 4 % GEL Apply 1 application topically See admin instructions. APPLY 1-3 TIMES A DAY TO PAINFUL SITES   Yes [provider]  metoprolol succinate (TOPROL-XL) 25 MG 24 hr tablet Take 25 mg by mouth daily.    Yes [provider]  potassium chloride (K-DUR) 10 MEQ tablet Take 10 mEq by mouth daily.   Yes [provider]  sertraline (ZOLOFT) 50 MG tablet TAKE 1/2 TABLET BY MOUTH ONCE A DAY FOR 1 WEEK, THEN 1 TAB ONCE A DAY 02/28/18  Yes [provider]  sulfamethoxazole-trimethoprim (BACTRIM DS,SEPTRA DS) 800-160 MG tablet TAKE 1 TABLET BY MOUTH TWICE A DAY FOR 14 DAYS 03/09/18  Yes [provider]  tetrahydrozoline (VISINE) 0.05 % ophthalmic solution Place 1 drop into both eyes 2 (two) times daily as needed.   Yes [provider]  zinc sulfate 220 (50 Zn) MG capsule Take 220 mg by mouth daily.   Yes [provider]  Biotin 10000 MCG TABS Take 10,000 mcg by mouth daily.    [provider]  H-CHLOR 12 0.125 % SOLN APPLY TO SACRUM 3 TIMES A WEEK: CLEAN AREA, PAT DRY, PACK WITH SOAKED GAUZE, COVER 06/23/17   [provider]  midodrine (PROAMATINE) 2.5 MG tablet Take 1 tablet (2.5 mg total) by mouth 3 (three) times daily with meals. Patient not taking: Reported on 12/01/2017 04/16/17   Raiford Noble Latif, DO  promethazine (PHENERGAN) 25 MG tablet Take 25 mg by mouth every 6 (six) hours as needed for nausea/vomiting. 06/22/17   [provider]    Family History Family History  Problem Relation Age of Onset  . Hypothyroidism Mother   . Diabetes Maternal Grandmother   . Heart disease Maternal Grandfather     Social History Social History   Tobacco Use  . Smoking status: Never Smoker  . Smokeless tobacco: Never Used  Substance Use Topics  . Alcohol use: No  . Drug use: No     Allergies   Clindamycin/lincomycin; Influenza  vaccines; Levaquin [levofloxacin in d5w]; Penicillins; Simvastatin; and Statins   Review of Systems Review of Systems  All other systems reviewed and are negative.    Physical Exam Updated Vital Signs BP 90/63   Pulse 94   Temp 98.4 F (36.9 C) (Rectal)   Resp (!) 23   Ht 1.549 m (5\' 1" )   SpO2 100%   BMI 25.47 kg/m   Physical Exam  HENT:  Head: Normocephalic and atraumatic.  Right Ear: External ear normal.  Left Ear: External ear normal.  Eyes: Conjunctivae are normal. Right eye exhibits no discharge. Left eye exhibits no discharge. No scleral icterus.  Neck: Neck supple. No tracheal deviation present.  Cardiovascular: Normal rate, regular rhythm and intact distal pulses.  Pulmonary/Chest: Effort normal. No stridor. No respiratory distress. She has no wheezes. She has rales.  Abdominal: Soft. Bowel sounds are normal. She exhibits no distension. There is no tenderness. There is no rebound and no guarding.  Ostomy bag with liquid brown stool  Musculoskeletal: She exhibits no edema or tenderness.  Large sacral decubitus ulcer, pink granulation tissue at the base of the wound, no surrounding erythema  Neurological: She is alert. She has normal strength. A sensory deficit is present. No cranial nerve deficit (no facial droop, extraocular movements intact, no slurred speech). She exhibits abnormal muscle tone. She displays no seizure activity.  Paraplegia  Skin: Skin is warm and dry. No rash noted. She is not diaphoretic.  Psychiatric: She has a normal mood and affect.  Nursing note and vitals reviewed.    ED Treatments / Results  Labs (all labs ordered are listed, but only abnormal results are displayed) Labs Reviewed  COMPREHENSIVE METABOLIC PANEL - Abnormal; Notable for the following components:      Result Value   BUN 22 (*)    Calcium 10.4 (*)    Total Protein 5.7 (*)    Albumin 2.8 (*)    All other components within normal limits  CBC WITH DIFFERENTIAL/PLATELET -  Abnormal; Notable for the following components:   Hemoglobin 9.0 (*)    HCT 31.6 (*)    MCV 75.4 (*)    MCH 21.5 (*)    MCHC 28.5 (*)    RDW 17.2 (*)    All other components within normal limits  CULTURE, BLOOD (ROUTINE X 2)  CULTURE, BLOOD (ROUTINE X 2)  URINE CULTURE  LIPASE, BLOOD  URINALYSIS, ROUTINE W REFLEX MICROSCOPIC  I-STAT CG4 LACTIC ACID, ED  I-STAT CG4 LACTIC ACID, ED    EKG EKG Interpretation  Date/Time:  Saturday March 14 2018 21:29:06 EDT Ventricular Rate:  99 PR Interval:    QRS Duration: 94 QT Interval:  330 QTC Calculation: 424 R Axis:   -54 Text Interpretation:  Sinus rhythm LAD, consider left anterior fascicular block Low voltage, precordial leads Abnormal R-wave progression, late transition Abnormal lateral Q waves Since last tracing rate slower Confirmed by Dorie Rank [phone]) on 03/14/2018 9:36:10 PM   Radiology Dg Chest Port 1 View  Result Date: 03/14/2018 CLINICAL DATA:  Cough.  Sepsis. EXAM: PORTABLE CHEST 1 VIEW COMPARISON:  CT 02/19/2018, radiograph 09/01/2017 FINDINGS: Chronic elevation of right hemidiaphragm. Heart is normal in size with unchanged mediastinal contours. Improved bibasilar aeration from prior exam. No confluent airspace disease. Minimal cephalization of pulmonary vasculature. No pleural fluid or pneumothorax. Skin fold projects over the right chest. Left renal stones incidentally visualized in the upper abdomen. IMPRESSION: Mild cephalization of pulmonary vasculature consistent with vascular congestion. No focal consolidation or other acute chest finding. Electronically Signed   By: Jeb Levering M.D.   On: 03/14/2018 21:24    Procedures Procedures (including critical care time)  Medications Ordered in ED Medications  sodium chloride 0.9 % bolus 1,000 mL (has no administration in time range)  sodium chloride 0.9 % bolus 1,000 mL (0 mLs Intravenous Stopped 03/14/18 2145)    And  sodium chloride 0.9 % bolus 1,000 mL (0 mLs  Intravenous Stopped 03/14/18 2215)     Initial Impression / Assessment and Plan / ED Course  I have reviewed the triage vital signs and the nursing notes.  Pertinent labs & imaging results that were available during my care of the patient were reviewed by me and considered in my medical decision making (see chart for details).  Patient presented to the emergency room with  complaints of nausea and generalized malaise.  Patient was noted to have borderline blood pressures here.  Initial labs are reassuring.  She has stable anemia.  CXR without PNA.  UA is pending.  Hypotension may be related to her diarrhea but will wait for UTI to assess for possible infection.  Case will be turned over to Dr Waverly Ferrari.  Final Clinical Impressions(s) / ED Diagnoses  pending   Dorie Rank, MD 03/15/18 0021

## 2018-03-14 NOTE — ED Triage Notes (Signed)
Pt BIB [facility] for code sepsis. Pt is at an independent living facility and is paraplegic. Pt has chronic indwelling cathter and was DX with UTI a few days ago and started on two ABX. She hasnt felt well

## 2018-03-15 ENCOUNTER — Encounter (HOSPITAL_COMMUNITY): Payer: Self-pay | Admitting: Internal Medicine

## 2018-03-15 DIAGNOSIS — K521 Toxic gastroenteritis and colitis: Secondary | ICD-10-CM | POA: Diagnosis present

## 2018-03-15 DIAGNOSIS — R601 Generalized edema: Secondary | ICD-10-CM | POA: Diagnosis present

## 2018-03-15 DIAGNOSIS — R591 Generalized enlarged lymph nodes: Secondary | ICD-10-CM | POA: Diagnosis not present

## 2018-03-15 DIAGNOSIS — I2510 Atherosclerotic heart disease of native coronary artery without angina pectoris: Secondary | ICD-10-CM | POA: Diagnosis present

## 2018-03-15 DIAGNOSIS — K9403 Colostomy malfunction: Secondary | ICD-10-CM | POA: Diagnosis present

## 2018-03-15 DIAGNOSIS — E43 Unspecified severe protein-calorie malnutrition: Secondary | ICD-10-CM | POA: Diagnosis present

## 2018-03-15 DIAGNOSIS — I10 Essential (primary) hypertension: Secondary | ICD-10-CM | POA: Diagnosis not present

## 2018-03-15 DIAGNOSIS — Z7189 Other specified counseling: Secondary | ICD-10-CM | POA: Diagnosis not present

## 2018-03-15 DIAGNOSIS — I110 Hypertensive heart disease with heart failure: Secondary | ICD-10-CM | POA: Diagnosis present

## 2018-03-15 DIAGNOSIS — N200 Calculus of kidney: Secondary | ICD-10-CM | POA: Diagnosis present

## 2018-03-15 DIAGNOSIS — K8020 Calculus of gallbladder without cholecystitis without obstruction: Secondary | ICD-10-CM | POA: Diagnosis present

## 2018-03-15 DIAGNOSIS — Z515 Encounter for palliative care: Secondary | ICD-10-CM | POA: Diagnosis present

## 2018-03-15 DIAGNOSIS — D509 Iron deficiency anemia, unspecified: Secondary | ICD-10-CM | POA: Diagnosis present

## 2018-03-15 DIAGNOSIS — G8220 Paraplegia, unspecified: Secondary | ICD-10-CM | POA: Diagnosis present

## 2018-03-15 DIAGNOSIS — B952 Enterococcus as the cause of diseases classified elsewhere: Secondary | ICD-10-CM | POA: Diagnosis present

## 2018-03-15 DIAGNOSIS — N319 Neuromuscular dysfunction of bladder, unspecified: Secondary | ICD-10-CM | POA: Diagnosis present

## 2018-03-15 DIAGNOSIS — I5022 Chronic systolic (congestive) heart failure: Secondary | ICD-10-CM | POA: Diagnosis present

## 2018-03-15 DIAGNOSIS — T83511A Infection and inflammatory reaction due to indwelling urethral catheter, initial encounter: Secondary | ICD-10-CM | POA: Diagnosis present

## 2018-03-15 DIAGNOSIS — Z96641 Presence of right artificial hip joint: Secondary | ICD-10-CM | POA: Diagnosis present

## 2018-03-15 DIAGNOSIS — L89154 Pressure ulcer of sacral region, stage 4: Secondary | ICD-10-CM | POA: Diagnosis present

## 2018-03-15 DIAGNOSIS — E1169 Type 2 diabetes mellitus with other specified complication: Secondary | ICD-10-CM | POA: Diagnosis present

## 2018-03-15 DIAGNOSIS — E871 Hypo-osmolality and hyponatremia: Secondary | ICD-10-CM | POA: Diagnosis not present

## 2018-03-15 DIAGNOSIS — F329 Major depressive disorder, single episode, unspecified: Secondary | ICD-10-CM | POA: Diagnosis present

## 2018-03-15 DIAGNOSIS — I5023 Acute on chronic systolic (congestive) heart failure: Secondary | ICD-10-CM

## 2018-03-15 DIAGNOSIS — I700 Atherosclerosis of aorta: Secondary | ICD-10-CM | POA: Diagnosis present

## 2018-03-15 DIAGNOSIS — G4733 Obstructive sleep apnea (adult) (pediatric): Secondary | ICD-10-CM | POA: Diagnosis present

## 2018-03-15 DIAGNOSIS — Y846 Urinary catheterization as the cause of abnormal reaction of the patient, or of later complication, without mention of misadventure at the time of the procedure: Secondary | ICD-10-CM | POA: Diagnosis present

## 2018-03-15 DIAGNOSIS — I313 Pericardial effusion (noninflammatory): Secondary | ICD-10-CM | POA: Diagnosis present

## 2018-03-15 DIAGNOSIS — N390 Urinary tract infection, site not specified: Secondary | ICD-10-CM | POA: Diagnosis present

## 2018-03-15 DIAGNOSIS — I9589 Other hypotension: Secondary | ICD-10-CM | POA: Diagnosis present

## 2018-03-15 LAB — CBC
HCT: 35.9 % — ABNORMAL LOW (ref 36.0–46.0)
HEMOGLOBIN: 10.1 g/dL — AB (ref 12.0–15.0)
MCH: 21.6 pg — ABNORMAL LOW (ref 26.0–34.0)
MCHC: 28.1 g/dL — AB (ref 30.0–36.0)
MCV: 76.7 fL — ABNORMAL LOW (ref 78.0–100.0)
Platelets: 345 10*3/uL (ref 150–400)
RBC: 4.68 MIL/uL (ref 3.87–5.11)
RDW: 17.4 % — AB (ref 11.5–15.5)
WBC: 8.8 10*3/uL (ref 4.0–10.5)

## 2018-03-15 LAB — BRAIN NATRIURETIC PEPTIDE: B Natriuretic Peptide: 52.6 pg/mL (ref 0.0–100.0)

## 2018-03-15 LAB — URINALYSIS, ROUTINE W REFLEX MICROSCOPIC
Bilirubin Urine: NEGATIVE
GLUCOSE, UA: NEGATIVE mg/dL
Ketones, ur: NEGATIVE mg/dL
Nitrite: NEGATIVE
PH: 7 (ref 5.0–8.0)
Protein, ur: 30 mg/dL — AB
SPECIFIC GRAVITY, URINE: 1.01 (ref 1.005–1.030)
WBC, UA: >50 WBC/hpf — ABNORMAL HIGH (ref 0–5)

## 2018-03-15 LAB — GASTROINTESTINAL PANEL BY PCR, STOOL (REPLACES STOOL CULTURE)
Adenovirus F40/41: NOT DETECTED
Astrovirus: NOT DETECTED
CYCLOSPORA CAYETANENSIS: NOT DETECTED
Campylobacter species: NOT DETECTED
Cryptosporidium: NOT DETECTED
ENTAMOEBA HISTOLYTICA: NOT DETECTED
ENTEROAGGREGATIVE E COLI (EAEC): NOT DETECTED
Enteropathogenic E coli (EPEC): NOT DETECTED
Enterotoxigenic E coli (ETEC): NOT DETECTED
GIARDIA LAMBLIA: NOT DETECTED
NOROVIRUS GI/GII: NOT DETECTED
Plesimonas shigelloides: NOT DETECTED
Rotavirus A: NOT DETECTED
SALMONELLA SPECIES: NOT DETECTED
SHIGELLA/ENTEROINVASIVE E COLI (EIEC): NOT DETECTED
Sapovirus (I, II, IV, and V): NOT DETECTED
Shiga like toxin producing E coli (STEC): NOT DETECTED
VIBRIO CHOLERAE: NOT DETECTED
VIBRIO SPECIES: NOT DETECTED
YERSINIA ENTEROCOLITICA: NOT DETECTED

## 2018-03-15 LAB — C DIFFICILE QUICK SCREEN W PCR REFLEX
C Diff antigen: NEGATIVE
C Diff interpretation: NOT DETECTED
C Diff toxin: NEGATIVE

## 2018-03-15 LAB — CREATININE, SERUM
CREATININE: 0.52 mg/dL (ref 0.44–1.00)
GFR calc Af Amer: >60 mL/min (ref >60)
GFR calc non Af Amer: >60 mL/min (ref >60)

## 2018-03-15 LAB — CBG MONITORING, ED
Glucose-Capillary: 69 mg/dL (ref 65–99)
Glucose-Capillary: 69 mg/dL (ref 65–99)

## 2018-03-15 LAB — GLUCOSE, CAPILLARY
GLUCOSE-CAPILLARY: 103 mg/dL — AB (ref 65–99)
GLUCOSE-CAPILLARY: 123 mg/dL — AB (ref 65–99)

## 2018-03-15 LAB — HEMOGLOBIN A1C
Hgb A1c MFr Bld: 4.4 % — ABNORMAL LOW (ref 4.8–5.6)
Mean Plasma Glucose: 79.58 mg/dL

## 2018-03-15 MED ORDER — SODIUM CHLORIDE 0.9 % IV BOLUS
1000.0000 mL | Freq: Once | INTRAVENOUS | Status: AC
  Administered 2018-03-15: 1000 mL via INTRAVENOUS

## 2018-03-15 MED ORDER — OXYCODONE HCL 5 MG PO TABS: 10.0000 mg | ORAL_TABLET | ORAL | Status: DC | PRN

## 2018-03-15 MED ORDER — FUROSEMIDE 10 MG/ML IJ SOLN
40.0000 mg | Freq: Every day | INTRAMUSCULAR | Status: DC
  Administered 2018-03-15 – 2018-03-16 (×2): 40 mg via INTRAVENOUS
  Filled 2018-03-15 (×2): qty 4

## 2018-03-15 MED ORDER — ASPIRIN EC 81 MG PO TBEC
81.0000 mg | DELAYED_RELEASE_TABLET | Freq: Every day | ORAL | Status: DC
  Administered 2018-03-15 – 2018-03-18 (×4): 81 mg via ORAL
  Filled 2018-03-15 (×4): qty 1

## 2018-03-15 MED ORDER — INSULIN ASPART 100 UNIT/ML ~~LOC~~ SOLN: 4.0000 [IU] | Freq: Three times a day (TID) | SUBCUTANEOUS | Status: DC

## 2018-03-15 MED ORDER — FLUTICASONE PROPIONATE 50 MCG/ACT NA SUSP
1.0000 | Freq: Every day | NASAL | Status: DC | PRN
  Filled 2018-03-15: qty 16

## 2018-03-15 MED ORDER — SPIRONOLACTONE 12.5 MG HALF TABLET: 12.5000 mg | ORAL_TABLET | Freq: Every day | ORAL | Status: DC

## 2018-03-15 MED ORDER — VITAMIN C 500 MG PO TABS
500.0000 mg | ORAL_TABLET | Freq: Every day | ORAL | Status: DC
  Administered 2018-03-15 – 2018-03-18 (×3): 500 mg via ORAL
  Filled 2018-03-15 (×4): qty 1

## 2018-03-15 MED ORDER — ACETAMINOPHEN 325 MG PO TABS: 650.0000 mg | ORAL_TABLET | Freq: Four times a day (QID) | ORAL | Status: DC | PRN

## 2018-03-15 MED ORDER — OXYCODONE HCL 5 MG PO TABS
5.0000 mg | ORAL_TABLET | ORAL | Status: DC | PRN
  Administered 2018-03-17: 5 mg via ORAL
  Filled 2018-03-15: qty 1

## 2018-03-15 MED ORDER — SERTRALINE HCL 50 MG PO TABS
50.0000 mg | ORAL_TABLET | Freq: Every day | ORAL | Status: DC
  Administered 2018-03-15 – 2018-03-18 (×4): 50 mg via ORAL
  Filled 2018-03-15 (×4): qty 1

## 2018-03-15 MED ORDER — ENOXAPARIN SODIUM 40 MG/0.4ML ~~LOC~~ SOLN
40.0000 mg | SUBCUTANEOUS | Status: DC
  Administered 2018-03-16 – 2018-03-17 (×2): 40 mg via SUBCUTANEOUS
  Filled 2018-03-15 (×3): qty 0.4

## 2018-03-15 MED ORDER — INSULIN ASPART 100 UNIT/ML ~~LOC~~ SOLN: 0.0000 [IU] | Freq: Every day | SUBCUTANEOUS | Status: DC

## 2018-03-15 MED ORDER — POTASSIUM CHLORIDE CRYS ER 10 MEQ PO TBCR
10.0000 meq | EXTENDED_RELEASE_TABLET | Freq: Every day | ORAL | Status: DC
  Administered 2018-03-15 – 2018-03-18 (×4): 10 meq via ORAL
  Filled 2018-03-15 (×5): qty 1

## 2018-03-15 MED ORDER — ZINC SULFATE 220 (50 ZN) MG PO CAPS
220.0000 mg | ORAL_CAPSULE | Freq: Every day | ORAL | Status: DC
  Administered 2018-03-16 – 2018-03-18 (×2): 220 mg via ORAL
  Filled 2018-03-15 (×4): qty 1

## 2018-03-15 MED ORDER — SODIUM CHLORIDE 0.9% FLUSH
3.0000 mL | Freq: Two times a day (BID) | INTRAVENOUS | Status: DC
  Administered 2018-03-15 – 2018-03-17 (×5): 3 mL via INTRAVENOUS

## 2018-03-15 MED ORDER — FERROUS SULFATE 325 (65 FE) MG PO TABS
325.0000 mg | ORAL_TABLET | Freq: Every day | ORAL | Status: DC
  Administered 2018-03-16 – 2018-03-18 (×3): 325 mg via ORAL
  Filled 2018-03-15 (×4): qty 1

## 2018-03-15 MED ORDER — HYDROCODONE-ACETAMINOPHEN 7.5-325 MG PO TABS: 1.0000 | ORAL_TABLET | Freq: Every day | ORAL | Status: DC | PRN

## 2018-03-15 MED ORDER — VITAMIN D 1000 UNITS PO TABS
1000.0000 [IU] | ORAL_TABLET | Freq: Every day | ORAL | Status: DC
  Administered 2018-03-16 – 2018-03-18 (×3): 1000 [IU] via ORAL
  Filled 2018-03-15 (×3): qty 1

## 2018-03-15 MED ORDER — ONDANSETRON HCL 4 MG/2ML IJ SOLN: 4.0000 mg | Freq: Four times a day (QID) | INTRAMUSCULAR | Status: DC | PRN

## 2018-03-15 MED ORDER — METOPROLOL SUCCINATE ER 25 MG PO TB24
25.0000 mg | ORAL_TABLET | Freq: Every day | ORAL | Status: DC
  Filled 2018-03-15 (×4): qty 1

## 2018-03-15 MED ORDER — WHITE PETROLATUM EX OINT
TOPICAL_OINTMENT | CUTANEOUS | Status: AC
  Filled 2018-03-15: qty 28.35

## 2018-03-15 MED ORDER — ONDANSETRON HCL 4 MG PO TABS
4.0000 mg | ORAL_TABLET | Freq: Four times a day (QID) | ORAL | Status: DC | PRN
Start: 2018-03-15 — End: 2018-03-18

## 2018-03-15 MED ORDER — VITAMIN D-3 25 MCG (1000 UT) PO CAPS: 1000.0000 [IU] | ORAL_CAPSULE | Freq: Every day | ORAL | Status: DC

## 2018-03-15 MED ORDER — ACETAMINOPHEN 650 MG RE SUPP: 650.0000 mg | Freq: Four times a day (QID) | RECTAL | Status: DC | PRN

## 2018-03-15 MED ORDER — INSULIN ASPART 100 UNIT/ML ~~LOC~~ SOLN: 0.0000 [IU] | Freq: Three times a day (TID) | SUBCUTANEOUS | Status: DC

## 2018-03-15 MED ORDER — LISINOPRIL 5 MG PO TABS
2.5000 mg | ORAL_TABLET | Freq: Every day | ORAL | Status: DC
  Filled 2018-03-15 (×3): qty 1

## 2018-03-15 NOTE — ED Notes (Signed)
Sent page text to admitting about c-diff sample

## 2018-03-15 NOTE — ED Notes (Signed)
Paged Dr. Evangeline Gula, cdiff and GI by PCR negative, T.O. To d/c enteric precautions.

## 2018-03-15 NOTE — H&P (Signed)
History and Physical    Sharon Warner EHU:[id number] DOB: 1950/12/18 DOA: 03/14/2018  PCP: [name], MD   Neurologist: Dr. Jannifer Franklin General surgeon Dr. Marcello Moores Cardiologist: Dr. Karlene Lineman although she has not seen him in several years   Patient coming from: Home via EMS  I have personally briefly reviewed patient's old medical records in [place]  Chief Complaint:   HPI: Sharon Warner is a 67 y.o. female with medical history significant of paraplegia thought due to transverse myelitis, neurogenic bladder, chronic indwelling Foley catheter, colostomy, sacral decubitus ulcer stage IV followed at wound care center, morbid obesity, hypertension, type 2 diabetes, osteomyelitis and chronic congestive heart failure is with a urinary tract infection a few days ago and started on 2 antibiotics but has not felt well.  She lives at an independent living facility and is paraplegic.  She complains of nausea and generalized malaise.  She has had a very poor appetite over the past 48 hours.  She did not have any associated vomiting.  If it can increase in liquid stool output into her ostomy bag since antibiotics have been started but has had no known fevers.  She is coughing but cannot bring anything up.  She denies any rashes or chest pain.  She has no abdominal pain.  Spoke to the staff at her assisted living facility and they recommended evaluation in the emergency department.  Blood pressures were fairly borderline and she was given 3 L of IV fluid boluses.  Pressures remained in the 70s.  Lactic acid was normal.  There is concern for a urinary tract infection she was being treated with clindamycin and trimethoprim sulfa as an outpatient but unfortunately I can find no urinalysis or urine culture from that visit.  She has had persistent low blood pressures and persistent high output from her colostomy back she was referred to medicine for further evaluation and management.   Review of Systems: As per  HPI otherwise all other systems reviewed and  negative.    Past Medical History:  Diagnosis Date  . Acute on chronic systolic CHF (congestive heart failure) ([place])   . Chronic heart failure ([place])   . Chronic indwelling Foley catheter    last changed few days ago  . Diabetes mellitus without complication ([place])   . Enlarged thoracic aorta ([place]) 03/14/2016  . H/O paraplegia    ascending paralysis unclear etiology  . Hypertension   . Lumbar spondylosis   . Morbid obesity ([place])   . OSA on CPAP   . Sacral decubitus ulcer, stage IV [facility])     Past Surgical History:  Procedure Laterality Date  . JOINT REPLACEMENT Right    Hip  . TONSILLECTOMY    . VAGINAL HYSTERECTOMY      Social History   Social History Narrative   Lives at [facility] right now   Patient just moved to [place] from [place].    Does have a cat at home.   Brother and son live locally.   Caffeine use:  1 cup per day   Right-handed     reports that she has never smoked. She has never used smokeless tobacco. She reports that she does not drink alcohol or use drugs.  Allergies  Allergen Reactions  . Clindamycin/Lincomycin Diarrhea  . Influenza Vaccines Other (See Comments)    Patient lost feeling in her legs and that spread  . Levaquin [Levofloxacin In D5w] Nausea Only  . Penicillins Other (See Comments)  From childhood: Has patient had a PCN reaction causing immediate rash, facial/tongue/throat swelling, SOB or lightheadedness with hypotension: Unknown Has patient had a PCN reaction causing severe rash involving mucus membranes or skin necrosis: Unknown Has patient had a PCN reaction that required hospitalization: No Has patient had a PCN reaction occurring within the last 10 years: No If all of the above answers are "NO", then may proceed with Cephalosporin use.  . Simvastatin Other (See Comments)    Reaction not recalled; PATIENT STATED THAT SHE "WILL NOT TAKE"  . Statins Other (See  Comments)    PATIENT STATED THAT SHE "WILL NOT TAKE" THESE    Family History  Problem Relation Age of Onset  . Hypothyroidism Mother   . Diabetes Maternal Grandmother   . Heart disease Maternal Grandfather     Prior to Admission medications   Medication Sig Start Date End Date Taking? Authorizing Provider  Ascorbic Acid (VITAMIN C PO) Take 1 tablet by mouth daily.   Yes [provider]  aspirin EC 81 MG tablet Take 81 mg by mouth daily.   Yes [provider]  Carbamide Peroxide (EAR DROPS OT) Place 3 drops in ear(s) daily as needed (Ear Pain).   Yes [provider]  Cholecalciferol (VITAMIN D-3) 1000 units CAPS Take 1,000 Units by mouth daily.   Yes [provider]  ciclopirox (PENLAC) 8 % solution Apply 1 application topically See admin instructions. TO BOTH FEET AT BEDTIME: Apply over nail and surrounding skin. Apply daily over previous coat. After seven (7) days, may remove with alcohol and continue cycle.   Yes [provider]  clindamycin (CLEOCIN) 300 MG capsule Take 1 capsule (300 mg total) by mouth 3 (three) times daily for 7 days. 03/12/18 03/19/18 Yes Duffy Bruce, MD  CRANBERRY PO Take 1 capsule by mouth daily.   Yes [provider]  ferrous sulfate 325 (65 FE) MG tablet Take 325 mg by mouth daily. 07/23/17  Yes [provider]  fluticasone (FLONASE) 50 MCG/ACT nasal spray Place 1 spray into both nostrils daily as needed for allergies.  06/22/17  Yes [provider]  furosemide (LASIX) 20 MG tablet Take 20 mg by mouth daily. 11/04/17  Yes [provider]  HYDROcodone-acetaminophen (NORCO) 7.5-325 MG tablet Take 1 tablet by mouth daily as needed (for severe pain).  08/18/17  Yes [provider]  Menthol, Topical Analgesic, (BIOFREEZE) 4 % GEL Apply 1 application topically See admin instructions. APPLY 1-3 TIMES A DAY TO PAINFUL SITES   Yes [provider]  metoprolol succinate  (TOPROL-XL) 25 MG 24 hr tablet Take 25 mg by mouth daily.    Yes [provider]  potassium chloride (K-DUR) 10 MEQ tablet Take 10 mEq by mouth daily.   Yes [provider]  sertraline (ZOLOFT) 50 MG tablet TAKE 1/2 TABLET BY MOUTH ONCE A DAY FOR 1 WEEK, THEN 1 TAB ONCE A DAY 02/28/18  Yes [provider]  sulfamethoxazole-trimethoprim (BACTRIM DS,SEPTRA DS) 800-160 MG tablet TAKE 1 TABLET BY MOUTH TWICE A DAY FOR 14 DAYS 03/09/18  Yes [provider]  tetrahydrozoline (VISINE) 0.05 % ophthalmic solution Place 1 drop into both eyes 2 (two) times daily as needed.   Yes [provider]  zinc sulfate 220 (50 Zn) MG capsule Take 220 mg by mouth daily.   Yes [provider]  Biotin 10000 MCG TABS Take 10,000 mcg by mouth daily.    [provider]  H-CHLOR 12 0.125 %  SOLN APPLY TO SACRUM 3 TIMES A WEEK: CLEAN AREA, PAT DRY, PACK WITH SOAKED GAUZE, COVER 06/23/17   [provider]  midodrine (PROAMATINE) 2.5 MG tablet Take 1 tablet (2.5 mg total) by mouth 3 (three) times daily with meals. Patient not taking: Reported on 12/01/2017 04/16/17   Raiford Noble Latif, DO  promethazine (PHENERGAN) 25 MG tablet Take 25 mg by mouth every 6 (six) hours as needed for nausea/vomiting. 06/22/17   [provider]    Physical Exam:  Constitutional: NAD, calm, comfortable, chronically ill Vitals:   03/15/18 0515 03/15/18 0530 03/15/18 0715 03/15/18 0730  BP: 96/61 (!) 99/58 91/62 92/69   Pulse: 72 78 86 87  Resp: 20 18 (!) 22   Temp:      TempSrc:      SpO2: 99% 100% 100% 100%  Height:       Eyes: PERRL, lids and conjunctivae normal ENMT: Mucous membranes are moist. Posterior pharynx clear of any exudate or lesions.Normal dentition.  Neck: normal, supple, no masses, no thyromegaly Nodes: Significant left groin lymphadenopathy noted nonpainful to palpation without any surrounding erythema no anterior chain posterior chain axillary  supraclavicular lymphadenopathy is appreciated Respiratory: clear to auscultation bilaterally, no wheezing, no crackles. Normal respiratory effort. No accessory muscle use.  Cardiovascular: Regular rate and rhythm, no murmurs / rubs / gallops. No extremity edema. 2+ pedal pulses. No carotid bruits.  Abdomen: no tenderness, no masses palpated. No hepatosplenomegaly. Bowel sounds positive.  Colostomy with large amounts of liquid stool requiring frequent changing of bag and drainage and leakage around the bag. Musculoskeletal: no clubbing / cyanosis. No joint deformity upper and lower extremities. Good ROM, bilateral frozen shoulders.  Poor muscle tone in the lower extremities Skin: no rashes, lesions, ulcers. No induration, stage IV sacral decub on back Neurologic: CN 2-12 grossly intact. Sensation intact, DTR normal. Strength 5/5 in all 4.  Psychiatric: Normal judgment and insight. Alert and oriented x 3. Normal mood.   Labs on Admission: I have personally reviewed following labs and imaging studies  CBC: Recent Labs  Lab 03/12/18 1306 03/14/18 2138 03/15/18 1137  WBC 8.1 8.2 8.8  NEUTROABS 4.5 4.7  --   HGB 10.3* 9.0* 10.1*  HCT 35.0* 31.6* 35.9*  MCV 74.8* 75.4* 76.7*  PLT 311 311 696   Basic Metabolic Panel: Recent Labs  Lab 03/12/18 1306 03/14/18 2138 03/15/18 1137  NA 134* 135  --   K 3.8 3.8  --   CL 98* 104  --   CO2 27 23  --   GLUCOSE 100* 93  --   BUN 29* 22*  --   CREATININE 0.44 0.50 0.52  CALCIUM 11.6* 10.4*  --    GFR: CrCl cannot be calculated (Unknown ideal weight.). Liver Function Tests: Recent Labs  Lab 03/12/18 1306 03/14/18 2138  AST 27 21  ALT 18 17  ALKPHOS 96 88  BILITOT 0.5 0.4  PROT 6.6 5.7*  ALBUMIN 3.3* 2.8*   Recent Labs  Lab 03/14/18 2138  LIPASE 19   No results for input(s): AMMONIA in the last 168 hours. Coagulation Profile: Recent Labs  Lab 03/12/18 1306  INR 1.31   Urine analysis:    Component Value Date/Time    COLORURINE YELLOW 03/15/2018 0217   APPEARANCEUR CLOUDY (A) 03/15/2018 0217   LABSPEC 1.010 03/15/2018 0217   PHURINE 7.0 03/15/2018 0217   GLUCOSEU NEGATIVE 03/15/2018 0217   HGBUR MODERATE (A) 03/15/2018 0217   BILIRUBINUR NEGATIVE 03/15/2018 0217   Benjamin Stain  NEGATIVE 03/15/2018 0217   PROTEINUR 30 (A) 03/15/2018 0217   NITRITE NEGATIVE 03/15/2018 0217   LEUKOCYTESUR LARGE (A) 03/15/2018 0217    Radiological Exams on Admission: Dg Chest Port 1 View  Result Date: 03/14/2018 CLINICAL DATA:  Cough.  Sepsis. EXAM: PORTABLE CHEST 1 VIEW COMPARISON:  CT 02/19/2018, radiograph 09/01/2017 FINDINGS: Chronic elevation of right hemidiaphragm. Heart is normal in size with unchanged mediastinal contours. Improved bibasilar aeration from prior exam. No confluent airspace disease. Minimal cephalization of pulmonary vasculature. No pleural fluid or pneumothorax. Skin fold projects over the right chest. Left renal stones incidentally visualized in the upper abdomen. IMPRESSION: Mild cephalization of pulmonary vasculature consistent with vascular congestion. No focal consolidation or other acute chest finding. Electronically Signed   By: Jeb Levering M.D.   On: 03/14/2018 21:24    EKG: Independently reviewed.  EKG shows sinus rhythm with left axis deviation and abnormal Q waves when compared to August 28, 2017 left axis deviation is new and left anterior fascicular block is resolved.  Assessment/Plan Active Problems:   Gastroenteritis and colitis, toxic   CHF NYHA class IV, acute on chronic, systolic (HCC)   Diffuse lymphadenopathy   Colostomy malfunction (HCC)   Type 2 diabetes mellitus (HCC)   Obstructive sleep apnea   Protein-calorie malnutrition (HCC)   Sacral decubitus ulcer, stage IV (HCC)   Anasarca   Morbid obesity (HCC)   Aortic calcification (HCC)   HTN (hypertension)   Paraplegia (HCC)   Neurogenic bladder   Bilateral nephrolithiasis   Cholelithiasis   Coronary artery  calcification seen on CAT scan   1.  Gastroenteritis and colitis toxic: Patient will be admitted into the hospital for further evaluation and management of presumed gastroenteritis.  This also could be high-output stool due to antibiotics.  Patient is on clindamycin and trimethoprim sulfa.  We will hold these antibiotics for now.  We will send a urine culture to determine if she requires further antibiotics.  Ideally would like to hydrate the patient due to her large volume of stool output but unfortunately I cannot.  Her CT scan shows anasarca she has a history of severe congestive heart failure with New York Heart Association at least class IV perhaps class V disease with an EF of 25 to 30%.  At this point rather than hydrating the patient I am going to actually give her IV diuretics.  She is massively volume overloaded.  Monitor fluid volume status very closely.  Await results of GI panel  2.  New York Heart Association class IV congestive heart failure acute on chronic and systolic: She with a previous ejection fraction of 25 to 30%.  An echocardiogram will consider cardiology consultation if patient does not seem to be improving significantly from her massive edema with current episode of diarrhea.  As she is somewhat intravascularly volume deplete from the diarrhea she should hopefully resorb some of her peripheral fluid and expel it with IV Lasix..  Have added lisinopril to her medication regimen.  3.  Diffuse lymphadenopathy: She was seen in the ER emergency department on the 13th and set up to see oncology for an outpatient biopsy.  Isaacs in the ER had discussed it with Dr. Malachy Mood from heme-onc.  Artery discharge will need to ensure that this is followed up with.  4.  Colostomy malfunction: Patient with profuse diarrhea likely due to a gastroenteritis.  C. difficile is negative we will hold offending antibiotics.  Hopefully her stool will firm up.  Nursing is  having great difficulty preventing the  colostomy from leaking.  If this continues we will ask wound care/ostomy care to look at this as well.  5.  Type 2 diabetes mellitus: Patient with a recent hemoglobin A1c of 4.4!  Not sure if she is not eating well or getting too much medication.  She is currently not on any outpatient diabetic medications.  Blood glucoses in the emergency department were acceptable.  We will check fingerstick blood glucoses before meals and at bedtime.  6.  Obstructive sleep apnea: Patient instructed to have family bring in her CPAP machine.  7.  Protein calorie malnutrition: Likely related to severity of her stage IV decubitus.  Will consult nutrition for assistance.  8.  Sacral decubitus stage IV: Wound care has been consulted will continue present management.  9.  Anasarca: This certainly would be consistent with an acute exacerbation of congestive heart failure complicated by malnutrition which can also cause anasarca.  10.  Morbid obesity: Noted.  11.  Aortic calcifications: Noted on CT scan today.  12.  Hypertension: Continue metoprolol.  13.  Paraplegia: Noted.  14.  Neurogenic bladder with indwelling Foley catheter: Noted suspect urinary tract infection await results of culture data.  15.  Bilateral nephrolithiasis: Patient with no history of uro-lithiasis.  She has multiple stones in her kidneys.  This was noted on CT scan.  46.  Cholelithiasis: No history of acute cholecystitis but patient does have multiple small stones in her gallbladder.  17.  Coronary artery calcification seen on CT scan: History of coronary artery disease or myocardial infarction.  Patient might benefit from a stress test as an outpatient.  18.  Goals of care: Certainly given the patient's severity of congestive heart failure and other multiple comorbidities a palliative medicine consultation for goals of care would be helpful.  DVT prophylaxis: Lovenox Code Status: Full CODE STATUS Family Communication: No family  present with the patient at the time of admission. Disposition Plan: Back to assisted living facility likely in 4 days Consults called: Wound care nurse, palliative care, nutrition Admission status: Inpatient   Lady Deutscher MD [facility] Pager [phone]  If 7PM-7AM, please contact night-coverage [email or web address] Password [facility]  03/15/2018, 12:46 PM

## 2018-03-15 NOTE — Progress Notes (Signed)
Signout  68-year-old female with paraplegia and chronic medical issues.  She has a chronic indwelling Foley neurogenic bladder, colostomy.  She notes that she felt badly yesterday and had high output liquid stool in her ostomy through today.  Today she is persistently hypotensive the patient came to the ER.  She is gotten a total of 3 L normal saline bolus but her blood pressure remains in the 70s to 80s.  Hospitalist have been asked to admit.  Her baseline blood pressures in the 100.  Lactic acid levels are normal, possible UTI

## 2018-03-15 NOTE — ED Notes (Signed)
Rec'd call from lab stating that they cannot test stool for C-Diff because it is formed.

## 2018-03-15 NOTE — ED Notes (Signed)
Pt drank soup

## 2018-03-16 ENCOUNTER — Telehealth: Payer: Self-pay | Admitting: Hematology

## 2018-03-16 ENCOUNTER — Inpatient Hospital Stay (HOSPITAL_COMMUNITY): Payer: Medicare Other

## 2018-03-16 DIAGNOSIS — R601 Generalized edema: Secondary | ICD-10-CM

## 2018-03-16 DIAGNOSIS — G8220 Paraplegia, unspecified: Secondary | ICD-10-CM

## 2018-03-16 DIAGNOSIS — Z515 Encounter for palliative care: Secondary | ICD-10-CM

## 2018-03-16 DIAGNOSIS — Z7189 Other specified counseling: Secondary | ICD-10-CM

## 2018-03-16 DIAGNOSIS — N200 Calculus of kidney: Secondary | ICD-10-CM

## 2018-03-16 DIAGNOSIS — I700 Atherosclerosis of aorta: Secondary | ICD-10-CM

## 2018-03-16 LAB — BASIC METABOLIC PANEL
ANION GAP: 8 (ref 5–15)
BUN: 16 mg/dL (ref 6–20)
CALCIUM: 9.9 mg/dL (ref 8.9–10.3)
CO2: 21 mmol/L — ABNORMAL LOW (ref 22–32)
CREATININE: 0.52 mg/dL (ref 0.44–1.00)
Chloride: 105 mmol/L (ref 101–111)
Glucose, Bld: 86 mg/dL (ref 65–99)
Potassium: 3.6 mmol/L (ref 3.5–5.1)
SODIUM: 134 mmol/L — AB (ref 135–145)

## 2018-03-16 LAB — CBC
HCT: 34.2 % — ABNORMAL LOW (ref 36.0–46.0)
HEMOGLOBIN: 9.9 g/dL — AB (ref 12.0–15.0)
MCH: 21.8 pg — ABNORMAL LOW (ref 26.0–34.0)
MCHC: 28.9 g/dL — ABNORMAL LOW (ref 30.0–36.0)
MCV: 75.2 fL — ABNORMAL LOW (ref 78.0–100.0)
PLATELETS: 362 10*3/uL (ref 150–400)
RBC: 4.55 MIL/uL (ref 3.87–5.11)
RDW: 17.5 % — ABNORMAL HIGH (ref 11.5–15.5)
WBC: 9 10*3/uL (ref 4.0–10.5)

## 2018-03-16 LAB — GLUCOSE, CAPILLARY
GLUCOSE-CAPILLARY: 79 mg/dL (ref 65–99)
GLUCOSE-CAPILLARY: 83 mg/dL (ref 65–99)
GLUCOSE-CAPILLARY: 86 mg/dL (ref 65–99)
Glucose-Capillary: 85 mg/dL (ref 65–99)

## 2018-03-16 LAB — BRAIN NATRIURETIC PEPTIDE: B NATRIURETIC PEPTIDE 5: 25.6 pg/mL (ref 0.0–100.0)

## 2018-03-16 MED ORDER — PRO-STAT SUGAR FREE PO LIQD
30.0000 mL | Freq: Every day | ORAL | Status: DC
  Administered 2018-03-17 – 2018-03-18 (×2): 30 mL via ORAL
  Filled 2018-03-16 (×3): qty 30

## 2018-03-16 MED ORDER — ADULT MULTIVITAMIN W/MINERALS CH
1.0000 | ORAL_TABLET | Freq: Every day | ORAL | Status: DC
  Administered 2018-03-16 – 2018-03-18 (×3): 1 via ORAL
  Filled 2018-03-16 (×3): qty 1

## 2018-03-16 MED ORDER — PREMIER PROTEIN SHAKE
11.0000 [oz_av] | Freq: Three times a day (TID) | ORAL | Status: DC
  Administered 2018-03-16 – 2018-03-17 (×3): 11 [oz_av] via ORAL
  Filled 2018-03-16 (×12): qty 325.31

## 2018-03-16 NOTE — Progress Notes (Signed)
Initial Nutrition Assessment  DOCUMENTATION CODES:   Severe malnutrition in context of chronic illness  INTERVENTION:   -Premier Protein TID, each supplement provides 160 kcals and 30 grams protein -MVI with minerals daily -30 ml Prostat daily, each supplement provides 100 kcals and 15 grams protein -Provided "Pressure Injury Nutrition Therapy" handout from AND's Nutrition Care Manual  NUTRITION DIAGNOSIS:   Severe Malnutrition related to chronic illness(transverese myelitis) as evidenced by energy intake < 75% for > or equal to 1 month, moderate fat depletion, severe fat depletion, moderate muscle depletion, severe muscle depletion.  GOAL:   Patient will meet greater than or equal to 90% of their needs  MONITOR:   PO intake, Supplement acceptance, Labs, Weight trends, Skin, I & O's  REASON FOR ASSESSMENT:   Malnutrition Screening Tool, Low [place], Consult Malnutrition Eval  ASSESSMENT:   Sharon Warner is a 67 y.o. female with medical history significant of paraplegia thought due to transverse myelitis, neurogenic bladder, chronic indwelling Foley catheter, colostomy, sacral decubitus ulcer stage IV followed at wound care center, morbid obesity, hypertension, type 2 diabetes, osteomyelitis and chronic congestive heart failure is with a urinary tract infection a few days ago and started on 2 antibiotics but has not felt well.  Pt admitted with gastroenteritis.   Spoke with pt and husband at bedside. Both confirm a general decline in health over the past year, which was a result of multiple hospitalizations and SNF stays. Pt husband estimates that pt progressively started losing weight around this time last year, related to illness and dislike of food at hospitals and SNFs. Even now, pt with a limiting appetite. Pt husband estimates that pt consumes about 50% of what she is used to eating. Pt consumes 3 meals per day (Breakfast: corn beef hash and fried egg, Lunch: fish, spinach,  and mashed potatoes, Dinner: [place], starch, and vegetable). Pt also consumes 2-3 Premier Protein Shakes and one Prostat AWC supplement each morning.   Reviewed wt hx; pt has experienced a 17% wt loss over the past year. While this is not significant for time frame, it is concerning given decreased oral intake, weight loss, and pressure injuries.   Pt is followed by [facility], where she received monthly I&Ds. Per notes from 03/12/18, pt was 134# at last visit. Unable to obtain current wt, due to pt on specialized bed. Pt also has a stage II pressure injury to rt scapula and stage IV pressure injury to coccyx. Wound vac was just [facility] by [facility] ear.ier today.  Pt is very vigilant to ensure she gets adequate protein in her diet. Per husband's request, reviewed protein sources with pt and provided handout.   Medications reviewed and include vitamin C and zinc.   Albumin has a half-life of 21 days and is strongly affected by stress response and inflammatory process, therefore, do not expect to see an improvement in this lab value during acute hospitalization.When a patient presents with low albumin, it is likely skewed due to the acute inflammatory response. Note that low albumin is no longer used to diagnose malnutrition; [place] uses the new malnutrition guidelines published by the American Society for Parenteral and Enteral Nutrition (A.S.P.E.N.) and the Academy of Nutrition and Dietetics (AND).    Last Hgb A1c: 4.4 (03/15/18). Pt on no DM medications PTA.   Labs reviewed: Na: 134, CBGS: 69-123 (inpatient orders for glycemic control are 0-15 units insulin aspart TID with meals and 0-5 units insulin aspart q HS).   NUTRITION -  FOCUSED PHYSICAL EXAM:    Most Recent Value  Orbital Region  Moderate depletion  Upper Arm Region  Severe depletion  Thoracic and Lumbar Region  Moderate depletion  Buccal Region  Moderate depletion  Temple Region  Moderate depletion  Clavicle Bone Region   Severe depletion  Clavicle and Acromion Bone Region  Severe depletion  Scapular Bone Region  Severe depletion  Dorsal Hand  Severe depletion  Patellar Region  Severe depletion  Anterior Thigh Region  Severe depletion  Posterior Calf Region  Severe depletion  Edema (RD Assessment)  None  Hair  Reviewed  Eyes  Reviewed  Mouth  Reviewed  Skin  Reviewed  Nails  Reviewed       Diet Order:   Diet Order           DIET SOFT Room service appropriate? Yes; Fluid consistency: Thin  Diet effective now          EDUCATION NEEDS:   Education needs have been addressed  Skin:  Skin Assessment: Skin Integrity Issues: Skin Integrity Issues:: Other (Comment), Stage II, Stage IV, Wound VAC Stage II: rt scapula Stage IV: coccyx with wound vac Wound Vac: stage IV coccyx Other: open wounds to rt posteior thigh and back  Last BM:  PTA  Height:   Ht Readings from Last 1 Encounters:  03/16/18 5\' 1"  (1.549 m)    Weight:   Wt Readings from Last 1 Encounters:  03/16/18 134 lb (60.8 kg)    Ideal Body Weight:  44.4 kg  BMI:  Body mass index is 25.32 kg/m.  Estimated Nutritional Needs:   Kcal:  0086-7619  Protein:  95-110 grams  Fluid:  > 1.7 L    Berkley Wrightsman A. Jimmye Norman, RD, LDN, CDE Pager: [phone] After hours Pager: [phone]

## 2018-03-16 NOTE — Progress Notes (Signed)
PROGRESS NOTE  Sharon Warner LKG:[id number] DOB: 05/22/1951 DOA: 03/14/2018 PCP: [name], MD  HPI/Recap of past 24 hours: Ms. 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  Admitted for suspected gastroenteritis and toxic colitis.  03/19/2018: Patient seen and examined at her bedside.  She has no new complaints.  GI panel C. difficile screening negative.  Assessment/Plan: Active Problems:   Morbid obesity (HCC)   Type 2 diabetes mellitus (HCC)   Aortic calcification (HCC)   HTN (hypertension)   Paraplegia (HCC)   Neurogenic bladder   Obstructive sleep apnea   Protein-calorie malnutrition (HCC)   Sacral decubitus ulcer, stage IV (HCC)   Gastroenteritis and colitis, toxic   CHF NYHA class IV, acute on chronic, systolic (HCC)   Diffuse lymphadenopathy   Bilateral nephrolithiasis   Cholelithiasis   Coronary artery calcification seen on CAT scan   Anasarca   Colostomy malfunction (HCC)  Suspected gastroenteritis/toxic colitis GI panel and C. difficile screening negative Persistent diarrhea If no change overnight will consult GI Encourage p.o. fluid intake  Chronic systolic CHF Last 2D echo revealed LVEF 25 to 30% on 04/09/2017 Repeat 2D echo done on 03/16/2018 pending results Continue, IV Lasix, lisinopril, Toprol-XL Strict I's and O Daily weight  Mild euvolemic hyponatremia Sodium 134 Fluid restriction Repeat BMP in the morning  Chronic microcytic anemia/iron deficiency anemia Baseline hemoglobin 10 Hemoglobin 9.9 no sign of overt bleeding MCV 75 Continue ferrous sulfate  Stage IV sacral decubitus ulcer in the setting of paraplegia Wound care specialist consulted Frequent  turning  Depression Continue Zoloft     Code Status: Full code  Family Communication: Spoke with husband on the phone and updated on current medical condition.  Disposition Plan: Home when clinically stable   Consultants:  Wound care specialist  Procedures:  None  Antimicrobials:  None  DVT prophylaxis: Subcu Lovenox daily   Objective: Vitals:   03/16/18 0504 03/16/18 0928 03/16/18 0934 03/16/18 1305  BP: 102/74  97/66 97/69  Pulse: 80  70 79  Resp:    16  Temp: 98 F (36.7 C)   98.1 F (36.7 C)  TempSrc: Oral   Oral  SpO2: 100%   100%  Weight:  60.8 kg (134 lb)    Height:  5\' 1"  (1.549 m)      Intake/Output Summary (Last 24 hours) at 03/16/2018 1514 Last data filed at 03/16/2018 1416 Gross per 24 hour  Intake 1057 ml  Output 1000 ml  Net 57 ml   Filed Weights   03/16/18 0928  Weight: 60.8 kg (134 lb)    Exam:  . General: 67 y.o. year-old female well developed well nourished in no acute distress.  Alert and oriented x3. . Cardiovascular: Regular rate and rhythm with no rubs or gallops.  No thyromegaly or JVD noted.   Marland Kitchen Respiratory: Clear to auscultation with no wheezes or rales. Good inspiratory effort. . Abdomen: Soft nontender nondistended with normal bowel sounds x4 quadrants. . Musculoskeletal: No lower extremity edema. 2/4 pulses in all 4 extremities. . Skin: Stage IV sacral decubitus ulcer, and other pressure wounds throughout the back. . Psychiatry: Mood is appropriate for condition and setting   Data Reviewed: CBC: Recent Labs  Lab 03/12/18 1306 03/14/18 2138 03/15/18 1137 03/16/18 0344  WBC 8.1 8.2 8.8 9.0  NEUTROABS 4.5 4.7  --   --   HGB 10.3* 9.0* 10.1* 9.9*  HCT 35.0* 31.6* 35.9* 34.2*  MCV 74.8* 75.4* 76.7* 75.2*  PLT 311 311 345 270   Basic Metabolic Panel: Recent Labs  Lab 03/12/18 1306 03/14/18 2138 03/15/18 1137 03/16/18 0344  NA 134* 135  --  134*  K 3.8 3.8  --  3.6  CL 98* 104  --  105  CO2 27 23  --  21*   GLUCOSE 100* 93  --  86  BUN 29* 22*  --  16  CREATININE 0.44 0.50 0.52 0.52  CALCIUM 11.6* 10.4*  --  9.9   GFR: Estimated Creatinine Clearance: 59.4 mL/min (by C-G formula based on SCr of 0.52 mg/dL). Liver Function Tests: Recent Labs  Lab 03/12/18 1306 03/14/18 2138  AST 27 21  ALT 18 17  ALKPHOS 96 88  BILITOT 0.5 0.4  PROT 6.6 5.7*  ALBUMIN 3.3* 2.8*   Recent Labs  Lab 03/14/18 2138  LIPASE 19   No results for input(s): AMMONIA in the last 168 hours. Coagulation Profile: Recent Labs  Lab 03/12/18 1306  INR 1.31   Cardiac Enzymes: No results for input(s): CKTOTAL, CKMB, CKMBINDEX, TROPONINI in the last 168 hours. BNP (last 3 results) No results for input(s): PROBNP in the last 8760 hours. HbA1C: Recent Labs    03/15/18 1137  HGBA1C 4.4*   CBG: Recent Labs  Lab 03/15/18 1654 03/15/18 1826 03/15/18 2134 03/16/18 0752 03/16/18 1244  GLUCAP 69 103* 123* 79 85   Lipid Profile: No results for input(s): CHOL, HDL, LDLCALC, TRIG, CHOLHDL, LDLDIRECT in the last 72 hours. Thyroid Function Tests: No results for input(s): TSH, T4TOTAL, FREET4, T3FREE, THYROIDAB in the last 72 hours. Anemia Panel: No results for input(s): VITAMINB12, FOLATE, FERRITIN, TIBC, IRON, RETICCTPCT in the last 72 hours. Urine analysis:    Component Value Date/Time   COLORURINE YELLOW 03/15/2018 0217   APPEARANCEUR CLOUDY (A) 03/15/2018 0217   LABSPEC 1.010 03/15/2018 0217   PHURINE 7.0 03/15/2018 0217   GLUCOSEU NEGATIVE 03/15/2018 0217   HGBUR MODERATE (A) 03/15/2018 0217   BILIRUBINUR NEGATIVE 03/15/2018 0217   KETONESUR NEGATIVE 03/15/2018 0217   PROTEINUR 30 (A) 03/15/2018 0217   NITRITE NEGATIVE 03/15/2018 0217   LEUKOCYTESUR LARGE (A) 03/15/2018 0217   Sepsis Labs: @LABRCNTIP (procalcitonin:4,lacticidven:4)  ) Recent Results (from the past 240 hour(s))  Blood Culture (routine x 2)     Status: None (Preliminary result)   Collection Time: 03/14/18  9:05 PM  Result  Value Ref Range Status   Specimen Description BLOOD RIGHT HAND  Final   Special Requests   Final    BOTTLES DRAWN AEROBIC AND ANAEROBIC Blood Culture adequate volume   Culture   Final    NO GROWTH 2 DAYS Performed at [facility], [place] [address], [place], [place] [address]    Report Status PENDING  Incomplete  Blood Culture (routine x 2)     Status: None (Preliminary result)   Collection Time: 03/14/18 10:02 PM  Result Value Ref Range Status   Specimen Description BLOOD LEFT THUMB  Final   Special Requests   Final    BOTTLES DRAWN AEROBIC AND ANAEROBIC Blood Culture results may not be optimal due to an inadequate volume of blood received in culture bottles   Culture   Final    NO GROWTH 2 DAYS Performed at [facility], [place],  [place] [address]    Report Status PENDING  Incomplete  Urine culture     Status: Abnormal (Preliminary result)   Collection Time: 03/15/18  2:01 AM  Result Value Ref Range Status   Specimen Description URINE, RANDOM  Final   Special Requests NONE  Final   Culture (A)  Final    >=100,000 COLONIES/mL ENTEROCOCCUS FAECALIS SUSCEPTIBILITIES TO FOLLOW Performed at [facility], [place] [address] [place], [place] [address]    Report Status PENDING  Incomplete  Gastrointestinal Panel by PCR , Stool     Status: None   Collection Time: 03/15/18  7:09 AM  Result Value Ref Range Status   Campylobacter species NOT DETECTED NOT DETECTED Final   Plesimonas shigelloides NOT DETECTED NOT DETECTED Final   Salmonella species NOT DETECTED NOT DETECTED Final   Yersinia enterocolitica NOT DETECTED NOT DETECTED Final   Vibrio species NOT DETECTED NOT DETECTED Final   Vibrio cholerae NOT DETECTED NOT DETECTED Final   Enteroaggregative E coli (EAEC) NOT DETECTED NOT DETECTED Final   Enteropathogenic E coli (EPEC) NOT DETECTED NOT DETECTED Final   Enterotoxigenic E coli (ETEC) NOT DETECTED NOT DETECTED Final   Shiga like toxin producing E  coli (STEC) NOT DETECTED NOT DETECTED Final   Shigella/Enteroinvasive E coli (EIEC) NOT DETECTED NOT DETECTED Final   Cryptosporidium NOT DETECTED NOT DETECTED Final   Cyclospora cayetanensis NOT DETECTED NOT DETECTED Final   Entamoeba histolytica NOT DETECTED NOT DETECTED Final   Giardia lamblia NOT DETECTED NOT DETECTED Final   Adenovirus F40/41 NOT DETECTED NOT DETECTED Final   Astrovirus NOT DETECTED NOT DETECTED Final   Norovirus GI/GII NOT DETECTED NOT DETECTED Final   Rotavirus A NOT DETECTED NOT DETECTED Final   Sapovirus (I, II, IV, and V) NOT DETECTED NOT DETECTED Final    Comment: Performed at [facility], [address] [place], [place] [address]  C difficile quick scan w PCR reflex     Status: None   Collection Time: 03/15/18  8:43 AM  Result Value Ref Range Status   C Diff antigen NEGATIVE NEGATIVE Final   C Diff toxin NEGATIVE NEGATIVE Final   C Diff interpretation No C. difficile detected.  Final    Comment: Performed at [facility], [place] [address] [place], [place] [address]      Studies: No results found.  Scheduled Meds: . aspirin EC  81 mg Oral Daily  . cholecalciferol  1,000 Units Oral Daily  . enoxaparin (LOVENOX) injection  40 mg Subcutaneous Q24H  . [START ON 03/17/2018] feeding supplement (PRO-STAT SUGAR FREE 64)  30 mL Oral Daily  . ferrous sulfate  325 mg Oral Daily  . furosemide  40 mg Intravenous Daily  . insulin aspart  0-15 Units Subcutaneous TID WC  . insulin aspart  0-5 Units Subcutaneous QHS  . lisinopril  2.5 mg Oral Daily  . metoprolol succinate  25 mg Oral Daily  . multivitamin with minerals  1 tablet Oral Daily  . potassium chloride  10 mEq Oral Daily  . protein supplement shake  11 oz Oral TID BM  . sertraline  50 mg Oral Daily  . sodium chloride flush  3 mL Intravenous Q12H  . vitamin C  500 mg Oral Daily  . zinc sulfate  220 mg Oral Daily    Continuous Infusions:   LOS: 1 day     Kayleen Memos, MD [facility] Pager [phone]  If 7PM-7AM, please contact night-coverage  [email or web address] Password [facility] 03/16/2018, 3:14 PM

## 2018-03-16 NOTE — Telephone Encounter (Signed)
Received a message from Dr. Irene Limbo to schedule the pt to see him on 6/20 at 320pm for probable lymphoma. Pt is currently in the hospital.

## 2018-03-16 NOTE — Care Management Note (Signed)
Case Management Note  Patient Details  Name: Sharon Warner MRN: [id number] Date of Birth: 1950/12/31  Subjective/Objective:                    Action/Plan:  Discussed discharge plan with patient at bedside. Confirmed face sheet information. Patient from home with [employer] [facility]. Husband can bring home VAC to patient at discharge. Patient will require ambulance transportation home at discharge.   Patient wants to continue to have [facility] and aide through [facility] . Will need resumption of care orders.   Patient would like palliative Care to follow her at home also. Choice provided. Patient would like Hospice and Palliative Care [facility] [place]. Referral called to Andrey Campanile at [facility].  Expected Discharge Date:                  Expected Discharge Plan:  [place]  In-House Referral:     Discharge planning Services  CM Consult  Post Acute Care Choice:  NA Choice offered to:  Patient  DME Arranged:    DME Agency:     HH Arranged:    [place] Agency:  [facility]  Status of Service:  In process, will continue to follow  If discussed at Long Length of Stay Meetings, dates discussed:    Additional Comments:  Marilu Favre, RN 03/16/2018, 4:35 PM

## 2018-03-16 NOTE — Consult Note (Signed)
Consultation Note Date: 03/16/2018   Patient Name: Sharon Warner  DOB: 1951-01-23  MRN: [id number]  Age / Sex: 67 y.o., female  PCP: [name], MD Referring Physician: Kayleen Memos, DO  Reason for Consultation: Establishing goals of care  HPI/Patient Profile:  Sharon Warner is a 67 y.o. female with medical history significant of paraplegia thought due to transverse myelitis, neurogenic bladder, chronic indwelling Foley catheter, colostomy, sacral decubitus ulcer stage IV followed at wound care center, hypertension, type 2 diabetes, osteomyelitis and chronic congestive heart failure. She was admitted for presumed gastroenteritis.   Clinical Assessment and Goals of Care: Patient is resting in bed. She is alert and oriented. She lives at home with her husband. She is a paraplegic and unable to move from the waist line down. She states this began around 3 years ago and doctors still do not have a cause. Her husband has drop foot from a wreck years ago and uses a walker. 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 She states she feels useless and can no longer help her husband. She has 2 adopted children, 1 of which she is estranged from.   She states she has learned to trust God in all of this. She does the best she can to stay positive. She tells me she had her colostomy electively as she did not want to feel like a child having someone wipe her.   We discussed her diagnosis, prognosis, GOC, EOL wishes disposition and options.  A detailed discussion was had today regarding advanced directives.  Concepts specific to code status, artifical feeding and hydration, IV antibiotics and rehospitalization were discussed.    She states she has talked with her husband about things and he knows how she feels. She states she would never want a feeding tube or dialysis. She would like to treat the treatable.   She states she would never want to be placed on a ventilator. She would like to remain a full code and states it is up to God what happens, but deciding not to try to resuscitate is  taking control from God over your life ending. She states if her family decided to override her wishes of not being placed on a vent if she was successfully resuscitated, she would be okay with that.       SUMMARY OF RECOMMENDATIONS    Recommend palliative to follow outpatient.   Code Status/Advance Care Planning:  Full code    Symptom Management:   Per primary team.  Palliative Prophylaxis:   Eye Care and Oral Care  Prognosis:   Unable to determine  Discharge Planning: To Be Determined      Primary Diagnoses: Present on Admission: . Paraplegia ([place]) . Neurogenic bladder . Morbid obesity ([place]) . HTN (hypertension) . Obstructive sleep apnea . Protein-calorie malnutrition ([place]) . Gastroenteritis and colitis, toxic . CHF NYHA class IV, acute on chronic, systolic (HCC) . Diffuse lymphadenopathy . Cholelithiasis . Coronary artery calcification seen on CAT scan . Aortic calcification (HCC) . Anasarca .  Colostomy malfunction ([place]) . Sacral decubitus ulcer, stage IV ([place])   I have reviewed the medical record, interviewed the patient and family, and examined the patient. The following aspects are pertinent.  Past Medical History:  Diagnosis Date  . Acute on chronic systolic CHF (congestive heart failure) ([place])   . Chronic heart failure ([place])   . Chronic indwelling Foley catheter    last changed few days ago  . Diabetes mellitus without complication ([place])   . Enlarged thoracic aorta ([place]) 03/14/2016  . H/O paraplegia    ascending paralysis unclear etiology  . Hypertension   . Lumbar spondylosis   . Morbid obesity ([place])   . OSA on CPAP   . Sacral decubitus ulcer, stage IV (HCC)    Social History   Socioeconomic History  . Marital status: Married    Spouse name: Lon  . Number of  children: 2  . Years of education: 5  . Highest education level: Not on file  Occupational History  . Not on file  Social Needs  . Financial resource strain: Not on file  . Food insecurity:    Worry: Not on file    Inability: Not on file  . Transportation needs:    Medical: Not on file    Non-medical: Not on file  Tobacco Use  . Smoking status: Never Smoker  . Smokeless tobacco: Never Used  Substance and Sexual Activity  . Alcohol use: No  . Drug use: No  . Sexual activity: Not on file  Lifestyle  . Physical activity:    Days per week: Not on file    Minutes per session: Not on file  . Stress: Not on file  Relationships  . Social connections:    Talks on phone: Not on file    Gets together: Not on file    Attends religious service: Not on file    Active member of club or organization: Not on file    Attends meetings of clubs or organizations: Not on file    Relationship status: Not on file  Other Topics Concern  . Not on file  Social History Narrative   Lives at [facility] right now   Patient moved to [place] from [place] in 2018   Does have a cat at home.   Brother and son live locally.   Caffeine use:  1 cup per day   Right-handed   Family History  Problem Relation Age of Onset  . Hypothyroidism Mother   . Diabetes Maternal Grandmother   . Heart disease Maternal Grandfather    Scheduled Meds: . aspirin EC  81 mg Oral Daily  . cholecalciferol  1,000 Units Oral Daily  . enoxaparin (LOVENOX) injection  40 mg Subcutaneous Q24H  . [START ON 03/17/2018] feeding supplement (PRO-STAT SUGAR FREE 64)  30 mL Oral Daily  . ferrous sulfate  325 mg Oral Daily  . furosemide  40 mg Intravenous Daily  . insulin aspart  0-15 Units Subcutaneous TID WC  . insulin aspart  0-5 Units Subcutaneous QHS  . lisinopril  2.5 mg Oral Daily  . metoprolol succinate  25 mg Oral Daily  . multivitamin with minerals  1 tablet Oral Daily  . potassium chloride  10 mEq  Oral Daily  . protein supplement shake  11 oz Oral TID BM  . sertraline  50 mg Oral Daily  . sodium chloride flush  3 mL Intravenous Q12H  . vitamin C  500 mg Oral Daily  .  zinc sulfate  220 mg Oral Daily   Continuous Infusions: PRN Meds:.acetaminophen **OR** acetaminophen, fluticasone, HYDROcodone-acetaminophen, ondansetron **OR** ondansetron (ZOFRAN) IV, oxyCODONE, oxyCODONE Medications Prior to Admission:  Prior to Admission medications   Medication Sig Start Date End Date Taking? Authorizing Provider  Ascorbic Acid (VITAMIN C PO) Take 1 tablet by mouth daily.   Yes [provider]  aspirin EC 81 MG tablet Take 81 mg by mouth daily.   Yes [provider]  Carbamide Peroxide (EAR DROPS OT) Place 3 drops in ear(s) daily as needed (Ear Pain).   Yes [provider]  Cholecalciferol (VITAMIN D-3) 1000 units CAPS Take 1,000 Units by mouth daily.   Yes [provider]  ciclopirox (PENLAC) 8 % solution Apply 1 application topically See admin instructions. TO BOTH FEET AT BEDTIME: Apply over nail and surrounding skin. Apply daily over previous coat. After seven (7) days, may remove with alcohol and continue cycle.   Yes [provider]  CRANBERRY PO Take 1 capsule by mouth daily.   Yes [provider]  ferrous sulfate 325 (65 FE) MG tablet Take 325 mg by mouth daily. 07/23/17  Yes [provider]  fluticasone (FLONASE) 50 MCG/ACT nasal spray Place 1 spray into both nostrils daily as needed for allergies.  06/22/17  Yes [provider]  furosemide (LASIX) 20 MG tablet Take 20 mg by mouth daily. 11/04/17  Yes [provider]  HYDROcodone-acetaminophen (NORCO) 7.5-325 MG tablet Take 1 tablet by mouth daily as needed (for severe pain).  08/18/17  Yes [provider]  Menthol, Topical Analgesic, (BIOFREEZE) 4 % GEL Apply 1 application topically See admin instructions. APPLY 1-3 TIMES A DAY TO PAINFUL SITES   Yes  [provider]  metoprolol succinate (TOPROL-XL) 25 MG 24 hr tablet Take 25 mg by mouth daily.    Yes [provider]  potassium chloride (K-DUR) 10 MEQ tablet Take 10 mEq by mouth daily.   Yes [provider]  sertraline (ZOLOFT) 50 MG tablet TAKE 1/2 TABLET BY MOUTH ONCE A DAY FOR 1 WEEK, THEN 1 TAB ONCE A DAY 02/28/18  Yes [provider]  sulfamethoxazole-trimethoprim (BACTRIM DS,SEPTRA DS) 800-160 MG tablet TAKE 1 TABLET BY MOUTH TWICE A DAY FOR 14 DAYS 03/09/18  Yes [provider]  tetrahydrozoline (VISINE) 0.05 % ophthalmic solution Place 1 drop into both eyes 2 (two) times daily as needed.   Yes [provider]  zinc sulfate 220 (50 Zn) MG capsule Take 220 mg by mouth daily.   Yes [provider]  H-CHLOR 12 0.125 % SOLN APPLY TO SACRUM 3 TIMES A WEEK: CLEAN AREA, PAT DRY, PACK WITH SOAKED GAUZE, COVER 06/23/17   [provider]  promethazine (PHENERGAN) 25 MG tablet Take 25 mg by mouth every 6 (six) hours as needed for nausea/vomiting. 06/22/17   [provider]   Allergies  Allergen Reactions  . Clindamycin/Lincomycin Diarrhea  . Influenza Vaccines Other (See Comments)    Patient lost feeling in her legs and that spread  . Levaquin [Levofloxacin In D5w] Nausea Only  . Penicillins Other (See Comments)    From childhood: Has patient had a PCN reaction causing immediate rash, facial/tongue/throat swelling, SOB or lightheadedness with hypotension: Unknown Has patient had a PCN reaction causing severe rash involving mucus membranes or skin necrosis: Unknown Has patient had a PCN reaction that required hospitalization: No Has patient had a PCN reaction occurring within the last 10 years: No If all of the  above answers are "NO", then may proceed with Cephalosporin use.  . Simvastatin Other (See Comments)    Reaction not recalled; PATIENT STATED THAT SHE "WILL NOT TAKE"  . Statins Other (See Comments)    PATIENT  STATED THAT SHE "WILL NOT TAKE" THESE   Review of Systems  Gastrointestinal:       Osotomy output increase.   Neurological: Positive for weakness.    Physical Exam  Constitutional: No distress.  Eyes: Left eye discharge:    Pulmonary/Chest: Effort normal.  Neurological: She is alert.  oriented  Skin: Skin is warm and dry.    Vital Signs: BP 97/69 (BP Location: Left Arm)   Pulse 79   Temp 98.1 F (36.7 C) (Oral)   Resp 16   Ht 5\' 1"  (1.549 m) Comment: 03/14/18  Wt 60.8 kg (134 lb) Comment: per encounter on 03/12/18  SpO2 100%   BMI 25.32 kg/m  Pain Scale: 0-10   Pain Score: 0-No pain   SpO2: SpO2: 100 % O2 Device:SpO2: 100 % O2 Flow Rate: .O2 Flow Rate (L/min): 2 L/min  IO: Intake/output summary:   Intake/Output Summary (Last 24 hours) at 03/16/2018 1621 Last data filed at 03/16/2018 1416 Gross per 24 hour  Intake 817 ml  Output 1000 ml  Net -183 ml    LBM:   Baseline Weight: Weight: 60.8 kg (134 lb)(per encounter on 03/12/18) Most recent weight: Weight: 60.8 kg (134 lb)(per encounter on 03/12/18)     Palliative Assessment/Data: paraplegic     Time In: 3:20 Time Out: 4:30 Time Total: 70 min Greater than 50%  of this time was spent counseling and coordinating care related to the above assessment and plan.  Signed by: Asencion Gowda, NP   Please contact Palliative Medicine Team phone at [phone] for questions and concerns.  For individual provider: See Shea Evans

## 2018-03-16 NOTE — Consult Note (Addendum)
[place] Nurse wound consult note Reason for Consult: sacrum  Patient from independent living community, cared for by husband and [facility] staff. Has NPWT dressing to the sacrum, however was removed prior to admission by husband due to leak. Known to have chronic osteomyelitis of the sacrum. Staff request that I also look at scapula and thigh wounds Wound type: Stage 4 pressure injury sacrum Left thigh partial thickness linear area on the left posterior thigh Pressure Injury POA: Yes Measurement: Sacrum: 4.5cm x 5.0cm x 1.0cm  Left thigh 0.3cm x 6cm x 0.1cm  Wound bed: Sacrum: 100% pink, non granular with severe epibole of the wound edges  Left thigh; pink, clean, healing Drainage (amount, consistency, odor)  Sacrum;minimal, no odor Left thigh:none Periwound: intact Dressing procedure/placement/frequency: NPWT dressing placed to the sacrum:  Filled wound with ___1___ pieces of black foam. Bridged dressing to the left hip, protected the skin with drape and used one strip of black foam for bridge Used 1/2 of ostomy barrier ring at the distal edge of the wound to aid in maintaining a seal of the dressing Sealed NPWT dressing at 131mm HG/188mmHG  Patient received IV/PO pain medication per bedside nurse prior to dressing change Patient tolerated procedure well  Ok for bedside nurse to change non-complex NPWT dressing M/W/F  T/Th/Sat  WOC nurse will continue to provide NPWT dressing changed due to the complexity of the dressing change.       [place] Nurse ostomy consult note Stoma type/location: LLQ, end colostomy  Stomal assessment/size: 1 1/4" round budded, pink stoma Peristomal assessment: denudation noted from 1-5 o'clock. Husband reports with onset of diarrhea began to have problems with pouch leaking a few days ago  Treatment options for stomal/peristomal skin: ostomy powder with skin barrier wipe Output liquid yellow/seedy Ostomy pouching: 2pc. 2 3/4" used today because when I arrived for  the wound consult it was noted her pouch was leaking about to fall off. Bedside nurse had 2 pc in the room.  I will order her 1pc and barrier ring, ostomy powder to change back to 1pc pouch with next change and treat her skin if needed.  Patient is dependent with all aspects of ostomy care.  I have ordered NPWT machine and needed supplies. I will place dressing once this arrives to the floor.   [place] nurse team will follow along with you for support with NPWT dressings ostomy care as needed.  [place], [place], [place]

## 2018-03-16 NOTE — Care Management Note (Signed)
Case Management Note  Patient Details  Name: Sharon Warner MRN: [id number] Date of Birth: 1950-12-24  Subjective/Objective:                    Action/Plan: Patient from home with husband and Chief of Staff and aide. Will need resumption of care orders  Expected Discharge Date:                  Expected Discharge Plan:  [place]  In-House Referral:     Discharge planning Services  CM Consult  Post Acute Care Choice:    Choice offered to:     DME Arranged:    DME Agency:     HH Arranged:    HH Agency:  [facility]  Status of Service:  In process, will continue to follow  If discussed at Long Length of Stay Meetings, dates discussed:    Additional Comments:  Marilu Favre, RN 03/16/2018, 12:34 PM

## 2018-03-17 ENCOUNTER — Inpatient Hospital Stay (HOSPITAL_COMMUNITY): Payer: Medicare Other

## 2018-03-17 ENCOUNTER — Encounter (HOSPITAL_COMMUNITY): Payer: Self-pay | Admitting: General Practice

## 2018-03-17 ENCOUNTER — Other Ambulatory Visit: Payer: Self-pay

## 2018-03-17 DIAGNOSIS — E43 Unspecified severe protein-calorie malnutrition: Secondary | ICD-10-CM

## 2018-03-17 DIAGNOSIS — I509 Heart failure, unspecified: Secondary | ICD-10-CM

## 2018-03-17 LAB — ECHOCARDIOGRAM COMPLETE
HEIGHTINCHES: 61 in
WEIGHTICAEL: 2144 [oz_av]

## 2018-03-17 LAB — URINE CULTURE: Culture: >=100000 — AB

## 2018-03-17 MED ORDER — NITROFURANTOIN MONOHYD MACRO 100 MG PO CAPS
100.0000 mg | ORAL_CAPSULE | Freq: Two times a day (BID) | ORAL | Status: DC
  Administered 2018-03-17 – 2018-03-18 (×3): 100 mg via ORAL
  Filled 2018-03-17 (×3): qty 1

## 2018-03-17 MED ORDER — FUROSEMIDE 20 MG PO TABS
20.0000 mg | ORAL_TABLET | Freq: Every day | ORAL | Status: DC
  Administered 2018-03-17 – 2018-03-18 (×2): 20 mg via ORAL
  Filled 2018-03-17 (×2): qty 1

## 2018-03-17 MED ORDER — ENSURE ENLIVE PO LIQD: 237.0000 mL | Freq: Two times a day (BID) | ORAL | Status: DC

## 2018-03-17 NOTE — Progress Notes (Signed)
Daily Progress Note   Patient Name: Sharon Warner       Date: 03/17/2018 DOB: 05/23/1951  Age: 67 y.o. MRN#: [id number] Attending Physician: Kayleen Memos, DO Primary Care Physician: [name], MD Admit Date: 03/14/2018  Reason for Consultation/Follow-up: Psychosocial/spiritual support  Subjective: Patient is resting in bed. She is in good spirits today. Discussed beach web cams to see the beach. She has ate only a few bites of breakfast; she does not like the food choices that were brought. She states she wishes she could order what she wants to eat. NT rounding on patient and food difficulties discussed. Menu brought to bedside and cereal brought. Husband in to bedside. He discusses protein shakes and what flavors she will need. Very involved.     Length of Stay: 2  Current Medications: Scheduled Meds:  . aspirin EC  81 mg Oral Daily  . cholecalciferol  1,000 Units Oral Daily  . enoxaparin (LOVENOX) injection  40 mg Subcutaneous Q24H  . feeding supplement (PRO-STAT SUGAR FREE 64)  30 mL Oral Daily  . ferrous sulfate  325 mg Oral Daily  . furosemide  20 mg Oral Daily  . insulin aspart  0-15 Units Subcutaneous TID WC  . insulin aspart  0-5 Units Subcutaneous QHS  . lisinopril  2.5 mg Oral Daily  . metoprolol succinate  25 mg Oral Daily  . multivitamin with minerals  1 tablet Oral Daily  . nitrofurantoin (macrocrystal-monohydrate)  100 mg Oral Q12H  . potassium chloride  10 mEq Oral Daily  . protein supplement shake  11 oz Oral TID BM  . sertraline  50 mg Oral Daily  . sodium chloride flush  3 mL Intravenous Q12H  . vitamin C  500 mg Oral Daily  . zinc sulfate  220 mg Oral Daily    Continuous Infusions:   PRN Meds: acetaminophen **OR** acetaminophen, fluticasone,  HYDROcodone-acetaminophen, ondansetron **OR** ondansetron (ZOFRAN) IV, oxyCODONE, oxyCODONE  Physical Exam  Constitutional: No distress.  Pulmonary/Chest: Effort normal.  Neurological: She is alert.  Oriented  Skin: Skin is warm and dry.            Vital Signs: BP (!) 89/66 (BP Location: Left Arm)   Pulse 89   Temp 97.6 F (36.4 C) (Oral)   Resp 17   Ht 5\' 1"  (1.549  m) Comment: 03/14/18  Wt 60.8 kg (134 lb) Comment: per encounter on 03/12/18  SpO2 100%   BMI 25.32 kg/m  SpO2: SpO2: 100 % O2 Device: O2 Device: Nasal Cannula O2 Flow Rate: O2 Flow Rate (L/min): 2 L/min  Intake/output summary:   Intake/Output Summary (Last 24 hours) at 03/17/2018 0947 Last data filed at 03/17/2018 0746 Gross per 24 hour  Intake 920 ml  Output 2350 ml  Net -1430 ml   LBM: Last BM Date: 03/16/18 Baseline Weight: Weight: 60.8 kg (134 lb)(per encounter on 03/12/18) Most recent weight: Weight: 60.8 kg (134 lb)(per encounter on 03/12/18)       Palliative Assessment/Data: paraplegic at baseline.       Patient Active Problem List   Diagnosis Date Noted  . Gastroenteritis and colitis, toxic 03/15/2018  . CHF NYHA class IV, acute on chronic, systolic ([place]) 41/96/2229  . Diffuse lymphadenopathy 03/15/2018  . Bilateral nephrolithiasis 03/15/2018  . Cholelithiasis 03/15/2018  . Coronary artery calcification seen on CAT scan 03/15/2018  . Anasarca 03/15/2018  . Colostomy malfunction ([place]) 03/15/2018  . Hypoxia   . Hypotension   . Catheter-associated urinary tract infection ([place]) 08/28/2017  . Sacroiliitis ([place]) 04/12/2017  . Streptococcal bacteremia 04/12/2017  . Gram-negative bacteremia 04/12/2017  . Sacral decubitus ulcer, stage IV ([place]) 08/15/2016  . Hypercalcemia 04/18/2016  . Protein-calorie malnutrition ([place]) 04/16/2016  . Multinodular goiter 04/16/2016  . Morbid obesity ([place]) 03/14/2016  . Type 2 diabetes mellitus ([place]) 03/14/2016  . Aortic calcification ([place]) 03/14/2016  . HTN  (hypertension) 03/14/2016  . Paraplegia ([place]) 03/14/2016  . Neurogenic bladder 03/14/2016  . Obstructive sleep apnea 03/14/2016  . Microcytic anemia 03/14/2016    Palliative Care Assessment & Plan   Patient Profile: Sharon Warner a 67 y.o.femalewith medical history significant ofparaplegia thought due to transverse myelitis, neurogenic bladder, chronic indwelling Foley catheter, colostomy, sacral decubitus ulcer stage IV followed at wound care center, hypertension, type 2 diabetes, osteomyelitis and chronic congestive heart failure. She was admitted for presumed gastroenteritis.  Assessment/Recommendations/Plan:  Recommend outpatient palliative to follow.    Code Status:    Code Status Orders  (From admission, onward)        Start     Ordered   03/15/18 0952  Full code  Continuous     03/15/18 1000    Code Status History    Date Active Date Inactive Code Status Order ID Comments User Context   08/28/2017 2042 09/04/2017 0223 Full Code [id number]  Etta Quill, DO ED   04/04/2017 1750 04/16/2017 1419 Full Code [id number]  Lavina Hamman, MD ED   03/25/2016 0853 04/11/2016 1634 Full Code [id number]  Leandra Kern Inpatient   03/14/2016 0212 03/21/2016 1954 Full Code [id number]  Lily Kocher, MD Inpatient       Prognosis:   Unable to determine  Discharge Planning:  To Be Determined  Care plan was discussed with NT  Thank you for allowing the Palliative Medicine Team to assist in the care of this patient.   Total Time 25 min Prolonged Time Billed No      Greater than 50%  of this time was spent counseling and coordinating care related to the above assessment and plan.  Asencion Gowda, NP  Please contact Palliative Medicine Team phone at [phone] for questions and concerns.

## 2018-03-17 NOTE — Progress Notes (Signed)
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  Admitted for suspected gastroenteritis and toxic colitis.  03/16/2018: Patient seen and examined at her bedside.  She has no new complaints.  GI panel and C. difficile screening negative.  03/17/2018: Patient seen and examined with her husband at bedside.  Urine culture positive for greater than 100,000 enterococci faecalis with sensitivity to Macrobid which was started.  Denies nausea, abdominal pain, chest pain or palpitations.  2D echo done today awaiting results.  Assessment/Plan: Active Problems:   Morbid obesity ([place])   Type 2 diabetes mellitus (HCC)   Aortic calcification (HCC)   HTN (hypertension)   Paraplegia (HCC)   Neurogenic bladder   Obstructive sleep apnea   Protein-calorie malnutrition ([place])   Sacral decubitus ulcer, stage IV (HCC)   Gastroenteritis and colitis, toxic   CHF NYHA class IV, acute on chronic, systolic (HCC)   Diffuse lymphadenopathy   Bilateral nephrolithiasis   Cholelithiasis   Coronary artery calcification seen on CAT scan   Anasarca   Colostomy malfunction (HCC)   Protein-calorie malnutrition, severe  Suspected gastroenteritis/toxic colitis GI panel and C. difficile screening negative Has a colostomy bag in place, output appears to be improving Avoid dehydration Avoid IV fluid due to systolic heart failure  Recurrent urinary tract infection in the setting of indwelling Foley catheter Urine culture grew greater than 100,000 Enterococcus faecalis Sensitive to Macrobid which was started today  03/17/2018 Monitor fever. Monitor WBC  Chronic systolic CHF/pericardial effusion Previous 2D echo revealed LVEF 25 to 30% and pericardial effusion on 04/09/2017 2D echo done on 03/17/2018 results are pending. Continue, IV Lasix, lisinopril, Toprol-XL Strict I's and O Daily weight  Mild euvolemic hyponatremia Sodium 134 Fluid restriction Repeat BMP in the morning  Chronic microcytic anemia/iron deficiency anemia Baseline hemoglobin 10 Hemoglobin 9.9 no sign of overt bleeding MCV 75 Continue ferrous sulfate  Stage IV sacral decubitus ulcer in the setting of paraplegia Wound care specialist consulted Frequent turning  Depression Continue Zoloft  Diffuse lymphadenopathy Noted     Code Status: Full code  Family Communication: Spoke with husband on the phone yesterday 03/16/2018 and in person today 03/17/2018 and updated on current medical condition and management.  Disposition Plan: Home when clinically stable possibly on Thursday, 03/19/2018 if no new events.   Consultants:  Wound care specialist  Procedures:  None  Antimicrobials:  Macrobid  DVT prophylaxis: Subcu Lovenox daily   Objective: Vitals:   03/16/18 1305 03/16/18 2307 03/16/18 2315 03/17/18 0641  BP: 97/69 (!) 88/60 (!) 94/58 (!) 89/66  Pulse: 79 81  89  Resp: 16 17    Temp: 98.1 F (36.7 C) 97.9 F (36.6 C)  97.6 F (36.4 C)  TempSrc: Oral Oral  Oral  SpO2: 100% 100%  100%  Weight:      Height:        Intake/Output Summary (Last 24 hours) at 03/17/2018 1252 Last data filed at 03/17/2018 1215 Gross per 24 hour  Intake 1495 ml  Output 2350 ml  Net -855 ml   Filed Weights   03/16/18 0928  Weight: 60.8 kg (134 lb)    Exam:  .  General: 67 y.o. year-old female well-developed and well-nourished in no acute distress.  Alert oriented x3. . Cardiovascular: Regular rate and rhythm with no rubs or gallops.  No JVD noted. Marland Kitchen Respiratory: Clear to auscultation with no wheezes or rales.  Good  inspiratory effort. . Abdomen: Soft nontender nondistended with normal bowel sounds x4 quadrants. . Musculoskeletal: No lower extremity edema. 2/4 pulses in all 4 extremities. . Skin: Stage IV sacral decubitus ulcer, and other pressure wounds throughout the back. . Psychiatry: Mood is appropriate for condition and setting   Data Reviewed: CBC: Recent Labs  Lab 03/12/18 1306 03/14/18 2138 03/15/18 1137 03/16/18 0344  WBC 8.1 8.2 8.8 9.0  NEUTROABS 4.5 4.7  --   --   HGB 10.3* 9.0* 10.1* 9.9*  HCT 35.0* 31.6* 35.9* 34.2*  MCV 74.8* 75.4* 76.7* 75.2*  PLT 311 311 345 332   Basic Metabolic Panel: Recent Labs  Lab 03/12/18 1306 03/14/18 2138 03/15/18 1137 03/16/18 0344  NA 134* 135  --  134*  K 3.8 3.8  --  3.6  CL 98* 104  --  105  CO2 27 23  --  21*  GLUCOSE 100* 93  --  86  BUN 29* 22*  --  16  CREATININE 0.44 0.50 0.52 0.52  CALCIUM 11.6* 10.4*  --  9.9   GFR: Estimated Creatinine Clearance: 59.4 mL/min (by C-G formula based on SCr of 0.52 mg/dL). Liver Function Tests: Recent Labs  Lab 03/12/18 1306 03/14/18 2138  AST 27 21  ALT 18 17  ALKPHOS 96 88  BILITOT 0.5 0.4  PROT 6.6 5.7*  ALBUMIN 3.3* 2.8*   Recent Labs  Lab 03/14/18 2138  LIPASE 19   No results for input(s): AMMONIA in the last 168 hours. Coagulation Profile: Recent Labs  Lab 03/12/18 1306  INR 1.31   Cardiac Enzymes: No results for input(s): CKTOTAL, CKMB, CKMBINDEX, TROPONINI in the last 168 hours. BNP (last 3 results) No results for input(s): PROBNP in the last 8760 hours. HbA1C: Recent Labs    03/15/18 1137  HGBA1C 4.4*   CBG: Recent Labs  Lab 03/15/18 2134 03/16/18 0752 03/16/18 1244 03/16/18 1725 03/16/18 2356  GLUCAP 123* 79 85 83 86   Lipid Profile: No results for input(s): CHOL, HDL, LDLCALC, TRIG, CHOLHDL, LDLDIRECT in the last 72 hours. Thyroid Function Tests: No results for input(s): TSH, T4TOTAL, FREET4, T3FREE, THYROIDAB in the last 72 hours. Anemia  Panel: No results for input(s): VITAMINB12, FOLATE, FERRITIN, TIBC, IRON, RETICCTPCT in the last 72 hours. Urine analysis:    Component Value Date/Time   COLORURINE YELLOW 03/15/2018 0217   APPEARANCEUR CLOUDY (A) 03/15/2018 0217   LABSPEC 1.010 03/15/2018 0217   PHURINE 7.0 03/15/2018 0217   GLUCOSEU NEGATIVE 03/15/2018 0217   HGBUR MODERATE (A) 03/15/2018 0217   BILIRUBINUR NEGATIVE 03/15/2018 0217   KETONESUR NEGATIVE 03/15/2018 0217   PROTEINUR 30 (A) 03/15/2018 0217   NITRITE NEGATIVE 03/15/2018 0217   LEUKOCYTESUR LARGE (A) 03/15/2018 0217   Sepsis Labs: @LABRCNTIP (procalcitonin:4,lacticidven:4)  ) Recent Results (from the past 240 hour(s))  Blood Culture (routine x 2)     Status: None (Preliminary result)   Collection Time: 03/14/18  9:05 PM  Result Value Ref Range Status   Specimen Description BLOOD RIGHT HAND  Final   Special Requests   Final    BOTTLES DRAWN AEROBIC AND ANAEROBIC Blood Culture adequate volume   Culture   Final    NO GROWTH 2 DAYS Performed at [facility], [address], [place], [place] [address]    Report Status PENDING  Incomplete  Blood Culture (routine x 2)     Status: None (Preliminary result)   Collection Time: 03/14/18 10:02 PM  Result Value Ref Range Status   Specimen Description BLOOD LEFT THUMB  Final   Special Requests   Final    BOTTLES DRAWN AEROBIC AND ANAEROBIC Blood Culture results may not be optimal due to an inadequate volume of blood received in culture bottles   Culture   Final    NO GROWTH 2 DAYS Performed at [facility], [place] [address] [place], [place] [address]    Report Status PENDING  Incomplete  Urine culture     Status: Abnormal   Collection Time: 03/15/18  2:01 AM  Result Value Ref Range Status   Specimen Description URINE, RANDOM  Final   Special Requests   Final    NONE Performed at [facility], [place] [address], [place], [place] [address]    Culture >=100,000 COLONIES/mL ENTEROCOCCUS  FAECALIS (A)  Final   Report Status 03/17/2018 FINAL  Final   Organism ID, Bacteria ENTEROCOCCUS FAECALIS (A)  Final      Susceptibility   Enterococcus faecalis - MIC*    AMPICILLIN <=2 SENSITIVE Sensitive     LEVOFLOXACIN >=8 RESISTANT Resistant     NITROFURANTOIN <=16 SENSITIVE Sensitive     VANCOMYCIN >=32 RESISTANT Resistant     LINEZOLID 2 SENSITIVE Sensitive     * >=100,000 COLONIES/mL ENTEROCOCCUS FAECALIS  Gastrointestinal Panel by PCR , Stool     Status: None   Collection Time: 03/15/18  7:09 AM  Result Value Ref Range Status   Campylobacter species NOT DETECTED NOT DETECTED Final   Plesimonas shigelloides NOT DETECTED NOT DETECTED Final   Salmonella species NOT DETECTED NOT DETECTED Final   Yersinia enterocolitica NOT DETECTED NOT DETECTED Final   Vibrio species NOT DETECTED NOT DETECTED Final   Vibrio cholerae NOT DETECTED NOT DETECTED Final   Enteroaggregative E coli (EAEC) NOT DETECTED NOT DETECTED Final   Enteropathogenic E coli (EPEC) NOT DETECTED NOT DETECTED Final   Enterotoxigenic E coli (ETEC) NOT DETECTED NOT DETECTED Final   Shiga like toxin producing E coli (STEC) NOT DETECTED NOT DETECTED Final   Shigella/Enteroinvasive E coli (EIEC) NOT DETECTED NOT DETECTED Final   Cryptosporidium NOT DETECTED NOT DETECTED Final   Cyclospora cayetanensis NOT DETECTED NOT DETECTED Final   Entamoeba histolytica NOT DETECTED NOT DETECTED Final   Giardia lamblia NOT DETECTED NOT DETECTED Final   Adenovirus F40/41 NOT DETECTED NOT DETECTED Final   Astrovirus NOT DETECTED NOT DETECTED Final   Norovirus GI/GII NOT DETECTED NOT DETECTED Final   Rotavirus A NOT DETECTED NOT DETECTED Final   Sapovirus (I, II, IV, and V) NOT DETECTED NOT DETECTED Final    Comment: Performed at [facility], [address] [place], [place] [address]  C difficile quick scan w PCR reflex     Status: None   Collection Time: 03/15/18  8:43 AM  Result Value Ref Range Status   C Diff antigen  NEGATIVE NEGATIVE Final   C Diff toxin NEGATIVE NEGATIVE Final   C Diff interpretation No C. difficile detected.  Final    Comment: Performed at [facility], [place] [address] [place], [place] [address]      Studies: No results found.  Scheduled Meds: . aspirin EC  81 mg Oral Daily  . cholecalciferol  1,000  Units Oral Daily  . enoxaparin (LOVENOX) injection  40 mg Subcutaneous Q24H  . feeding supplement (PRO-STAT SUGAR FREE 64)  30 mL Oral Daily  . ferrous sulfate  325 mg Oral Daily  . furosemide  20 mg Oral Daily  . lisinopril  2.5 mg Oral Daily  . metoprolol succinate  25 mg Oral Daily  . multivitamin with minerals  1 tablet Oral Daily  . nitrofurantoin (macrocrystal-monohydrate)  100 mg Oral Q12H  . potassium chloride  10 mEq Oral Daily  . protein supplement shake  11 oz Oral TID BM  . sertraline  50 mg Oral Daily  . sodium chloride flush  3 mL Intravenous Q12H  . vitamin C  500 mg Oral Daily  . zinc sulfate  220 mg Oral Daily    Continuous Infusions:   LOS: 2 days     Kayleen Memos, MD [facility] Pager [phone]  If 7PM-7AM, please contact night-coverage [email or web address] Password [email or web address] 03/17/2018, 12:52 PM

## 2018-03-17 NOTE — Progress Notes (Signed)
Pt refused lab draw this morning. Advised of importance of getting lab draw but pt still refused. Pt stated" I need just one day of break from needles". Forrest Moron, NP made aware via text page.

## 2018-03-17 NOTE — Progress Notes (Signed)
  Echocardiogram 2D Echocardiogram has been performed.  Sharon Warner 03/17/2018, 11:18 AM

## 2018-03-18 DIAGNOSIS — I10 Essential (primary) hypertension: Secondary | ICD-10-CM

## 2018-03-18 DIAGNOSIS — R591 Generalized enlarged lymph nodes: Secondary | ICD-10-CM

## 2018-03-18 DIAGNOSIS — N319 Neuromuscular dysfunction of bladder, unspecified: Secondary | ICD-10-CM

## 2018-03-18 LAB — BASIC METABOLIC PANEL
Anion gap: 6 (ref 5–15)
BUN: 21 mg/dL — AB (ref 6–20)
CALCIUM: 10.2 mg/dL (ref 8.9–10.3)
CO2: 24 mmol/L (ref 22–32)
CREATININE: 0.48 mg/dL (ref 0.44–1.00)
Chloride: 106 mmol/L (ref 101–111)
GFR calc non Af Amer: >60 mL/min (ref >60)
Glucose, Bld: 82 mg/dL (ref 65–99)
Potassium: 3.5 mmol/L (ref 3.5–5.1)
Sodium: 136 mmol/L (ref 135–145)

## 2018-03-18 LAB — CBC
HCT: 32.1 % — ABNORMAL LOW (ref 36.0–46.0)
Hemoglobin: 9.3 g/dL — ABNORMAL LOW (ref 12.0–15.0)
MCH: 21.9 pg — AB (ref 26.0–34.0)
MCHC: 29 g/dL — AB (ref 30.0–36.0)
MCV: 75.7 fL — ABNORMAL LOW (ref 78.0–100.0)
PLATELETS: 271 10*3/uL (ref 150–400)
RBC: 4.24 MIL/uL (ref 3.87–5.11)
RDW: 17.4 % — AB (ref 11.5–15.5)
WBC: 7.5 10*3/uL (ref 4.0–10.5)

## 2018-03-18 LAB — MAGNESIUM: MAGNESIUM: 1.3 mg/dL — AB (ref 1.7–2.4)

## 2018-03-18 MED ORDER — ADULT MULTIVITAMIN W/MINERALS CH: 1.0000 | ORAL_TABLET | Freq: Every day | ORAL | 0 refills | Status: AC

## 2018-03-18 MED ORDER — PRO-STAT SUGAR FREE PO LIQD: 30.0000 mL | Freq: Every day | ORAL | 0 refills | Status: AC

## 2018-03-18 MED ORDER — NITROFURANTOIN MONOHYD MACRO 100 MG PO CAPS: 100.0000 mg | ORAL_CAPSULE | Freq: Two times a day (BID) | ORAL | 0 refills | Status: AC

## 2018-03-18 MED ORDER — HYDROCODONE-ACETAMINOPHEN 7.5-325 MG PO TABS: 1.0000 | ORAL_TABLET | Freq: Every day | ORAL | 0 refills | Status: AC | PRN

## 2018-03-18 MED ORDER — SERTRALINE HCL 50 MG PO TABS: 50.0000 mg | ORAL_TABLET | Freq: Every day | ORAL | Status: DC

## 2018-03-18 MED ORDER — ONDANSETRON HCL 4 MG PO TABS: 4.0000 mg | ORAL_TABLET | Freq: Four times a day (QID) | ORAL | 0 refills | Status: AC | PRN

## 2018-03-18 MED ORDER — MAGNESIUM SULFATE 2 GM/50ML IV SOLN
2.0000 g | Freq: Once | INTRAVENOUS | Status: AC
  Administered 2018-03-18: 2 g via INTRAVENOUS
  Filled 2018-03-18: qty 50

## 2018-03-18 NOTE — Care Management Note (Signed)
Case Management Note  Patient Details  Name: Sharon Warner MRN: [id number] Date of Birth: 1951/02/11  Subjective/Objective:                    Action/Plan:  Patient for discharge today. Hospital nurse here will have [place] change VAC before discharge today and connect home VAC.   Patient requesting PTAR transportation home. Confirmed face sheet information with patient. Will complete PTAR paperwork and leave in shadow chart. Bedside nurse will call PTAR when patient ready for discharge.   Expected Discharge Date:  03/18/18               Expected Discharge Plan:  [place]  In-House Referral:     Discharge planning Services  CM Consult  Post Acute Care Choice:  NA Choice offered to:  Patient  DME Arranged:  N/A DME Agency:  NA  HH Arranged:  RN, Nurse's Aide [place] Agency:  [facility]  Status of Service:  In process, will continue to follow  If discussed at Long Length of Stay Meetings, dates discussed:    Additional Comments:  Marilu Favre, RN 03/18/2018, 10:17 AM

## 2018-03-18 NOTE — Consult Note (Signed)
[place] Nurse wound follow up Wound type: chronic stage 4 pressure injury Measurement:4.5cm x 5cm x 1cm Wound bed: Drainage (amount, consistency, odor)  Periwound:100% pink, non granulating with epibole wound edges.  Dressing procedure/placement/frequency:changed NPWT dressing and attached to home care device. I used 1 piece of black foam in wound bed and one piece for bridge to left hip after protecting skin with drape, dressing sealed with no leak. Measurements and date placed on tubing. Patient tolerated well. She will be discharged prior to another dressing change therefore we will not follow.  Please re-consult if we need to assist further.  Fara Olden, RN-C, WTA-C Wound Treatment Associate

## 2018-03-18 NOTE — Progress Notes (Signed)
Husband made aware of the discharge.

## 2018-03-18 NOTE — Progress Notes (Signed)
Discharge home via [place]. AVS given to PTAR to give to husband. Wound vac changed prior to discharge. Patient was alert and oriented, not in any distress upon discharge.

## 2018-03-18 NOTE — Discharge Summary (Signed)
Physician Discharge Summary  Sharon Warner KCL:[id number] DOB: 10/22/1950 DOA: 03/14/2018  PCP: [name], MD  Admit date: 03/14/2018 Discharge date: 03/18/2018  Admitted From: Home Disposition:  Home  Recommendations for Outpatient Follow-up:  1. 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  Patient was admitted for suspected gastroenteritis.  Stool studies was negative for C. difficile and GI panel PCR.  Patient can improved.  Urine culture grew Enterococcus faecalis which was sensitive to Macrobid, she was started on Macrobid.  Her condition has improved.  She will be discharged home.  Discharge Diagnoses:  Active Problems:   Morbid obesity ([place])   Type 2 diabetes mellitus (HCC)   Aortic calcification (HCC)   HTN (hypertension)   Paraplegia (HCC)   Neurogenic bladder   Obstructive sleep apnea   Protein-calorie malnutrition (HCC)   Sacral decubitus ulcer, stage IV (HCC)   Gastroenteritis and colitis, toxic   CHF NYHA class IV, acute on chronic, systolic (HCC)   Diffuse lymphadenopathy   Bilateral nephrolithiasis   Cholelithiasis   Coronary artery calcification seen on CAT scan   Anasarca   Colostomy malfunction (HCC)   Protein-calorie malnutrition, severe  Probable gastroenteritis -GI panel and C. difficile negative -Condition has much improved.  Outpatient follow-up with PCP.  Tolerating diet  Recurrent urinary tract infection in the setting of indwelling Foley catheter probably related to Foley catheter -Urine culture grew E faecalis and patient was started  on Macrobid.  Continue Macrobid on discharge. -Currently afebrile  Chronic systolic CHF/pericardial effusion -Echo showed improved EF of 40 to 45% -Continue Lasix and metoprolol.  Outpatient follow-up with PCP/cardiology  Mild hyponatremia -Improved  Chronic microcytic anemia/iron deficiency anemia -Hemoglobin stable.  Continue ferrous sulfate  Stage IV sacral decubitus ulcer in the setting of paraplegia -Continue wound care as per wound care nurse recommendations  Depression -Continue Zoloft  Diffuse lymphadenopathy -Outpatient follow-up with PCP regarding need for biopsy to rule out hematological conditions.  Discharge Instructions  Discharge Instructions    Call MD for:  difficulty breathing, headache or visual disturbances   Complete by:  As directed    Call MD for:  extreme fatigue   Complete by:  As directed    Call MD for:  hives   Complete by:  As directed    Call MD for:  persistant dizziness or light-headedness   Complete by:  As directed    Call MD for:  persistant nausea and vomiting   Complete by:  As directed    Call MD for:  redness, tenderness, or signs of infection (pain, swelling, redness, odor or green/yellow discharge around incision site)   Complete by:  As directed    Call MD for:  severe uncontrolled pain   Complete by:  As directed    Call MD for:  temperature >100.4   Complete by:  As directed    Diet - low sodium heart healthy   Complete by:  As directed    Increase activity slowly   Complete by:  As directed      Allergies as of 03/18/2018      Reactions   Clindamycin/lincomycin Diarrhea   Influenza Vaccines Other (See  Comments)   Patient lost feeling in her legs and that spread   Levaquin [levofloxacin In D5w] Nausea Only   Penicillins Other (See Comments)   From childhood: Has patient had a PCN reaction causing immediate rash, facial/tongue/throat swelling, SOB or lightheadedness with hypotension: Unknown Has patient had a PCN  reaction causing severe rash involving mucus membranes or skin necrosis: Unknown Has patient had a PCN reaction that required hospitalization: No Has patient had a PCN reaction occurring within the last 10 years: No If all of the above answers are "NO", then may proceed with Cephalosporin use.   Simvastatin Other (See Comments)   Reaction not recalled; PATIENT STATED THAT SHE "WILL NOT TAKE"   Statins Other (See Comments)   PATIENT STATED THAT SHE "WILL NOT TAKE" THESE      Medication List    STOP taking these medications   sulfamethoxazole-trimethoprim 800-160 MG tablet Commonly known as:  BACTRIM DS,SEPTRA DS     TAKE these medications   aspirin EC 81 MG tablet Take 81 mg by mouth daily.   BIOFREEZE 4 % Gel Generic drug:  Menthol (Topical Analgesic) Apply 1 application topically See admin instructions. APPLY 1-3 TIMES A DAY TO PAINFUL SITES   ciclopirox 8 % solution Commonly known as:  PENLAC Apply 1 application topically See admin instructions. TO BOTH FEET AT BEDTIME: Apply over nail and surrounding skin. Apply daily over previous coat. After seven (7) days, may remove with alcohol and continue cycle.   CRANBERRY PO Take 1 capsule by mouth daily.   EAR DROPS OT Place 3 drops in ear(s) daily as needed (Ear Pain).   feeding supplement (PRO-STAT SUGAR FREE 64) Liqd Take 30 mLs by mouth daily.   ferrous sulfate 325 (65 FE) MG tablet Take 325 mg by mouth daily.   fluticasone 50 MCG/ACT nasal spray Commonly known as:  FLONASE Place 1 spray into both nostrils daily as needed for allergies.   furosemide 20 MG tablet Commonly known as:  LASIX Take 20 mg by mouth daily.   H-CHLOR 12 0.125 % Soln Generic drug:  [place] APPLY TO SACRUM 3 TIMES A WEEK: CLEAN AREA, PAT DRY, PACK WITH SOAKED GAUZE, COVER   HYDROcodone-acetaminophen 7.5-325 MG tablet Commonly known as:  NORCO Take 1 tablet by mouth daily as needed (for severe pain).   metoprolol succinate 25 MG 24 hr  tablet Commonly known as:  TOPROL-XL Take 25 mg by mouth daily.   multivitamin with minerals Tabs tablet Take 1 tablet by mouth daily.   nitrofurantoin (macrocrystal-monohydrate) 100 MG capsule Commonly known as:  MACROBID Take 1 capsule (100 mg total) by mouth every 12 (twelve) hours for 5 days.   ondansetron 4 MG tablet Commonly known as:  ZOFRAN Take 1 tablet (4 mg total) by mouth every 6 (six) hours as needed for nausea.   potassium chloride 10 MEQ tablet Commonly known as:  K-DUR Take 10 mEq by mouth daily.   promethazine 25 MG tablet Commonly known as:  PHENERGAN Take 25 mg by mouth every 6 (six) hours as needed for nausea/vomiting.   sertraline 50 MG tablet Commonly known as:  ZOLOFT Take 1 tablet (50 mg total) by mouth daily. What changed:  See the new instructions.   VISINE 0.05 % ophthalmic solution Generic drug:  tetrahydrozoline Place 1 drop into both eyes 2 (two) times daily as needed.   VITAMIN C PO Take 1 tablet by mouth daily.   Vitamin D-3 1000 units Caps Take 1,000 Units by  mouth daily.   zinc sulfate 220 (50 Zn) MG capsule Take 220 mg by mouth daily.      Follow-up Information    [facility] Follow up.   Specialty:  Home Health Services Contact information: [address] [place] [address] [phone]        [facility] Follow up.   Why:  Provide Palliative Care services  Contact information: [place] [phone]       Koirala, Dibas, MD. Schedule an appointment as soon as possible for a visit in 1 week(s).   Specialty:  Family Medicine Contact information: [address] [place] [address] [phone]          Allergies  Allergen Reactions  . Clindamycin/Lincomycin Diarrhea  . Influenza Vaccines Other (See Comments)    Patient lost feeling in her legs and that spread  . Levaquin [Levofloxacin In D5w] Nausea  Only  . Penicillins Other (See Comments)    From childhood: Has patient had a PCN reaction causing immediate rash, facial/tongue/throat swelling, SOB or lightheadedness with hypotension: Unknown Has patient had a PCN reaction causing severe rash involving mucus membranes or skin necrosis: Unknown Has patient had a PCN reaction that required hospitalization: No Has patient had a PCN reaction occurring within the last 10 years: No If all of the above answers are "NO", then may proceed with Cephalosporin use.  . Simvastatin Other (See Comments)    Reaction not recalled; PATIENT STATED THAT SHE "WILL NOT TAKE"  . Statins Other (See Comments)    PATIENT STATED THAT SHE "WILL NOT TAKE" THESE    Consultations:  None   Procedures/Studies: Ct Chest W Contrast  Result Date: 02/20/2018 CLINICAL DATA:  Follow-up thoracic aneurysm EXAM: CT CHEST WITH CONTRAST TECHNIQUE: Multidetector CT imaging of the chest was performed during intravenous contrast administration. CONTRAST:  75mL OMNIPAQUE IOHEXOL 300 MG/ML  SOLN COMPARISON:  08/29/2017 FINDINGS: Cardiovascular: 4.1 cm ascending thoracic aortic aneurysm is stable. Heart is normal size. No evidence of aortic dissection. Scattered coronary artery calcifications in the left anterior descending coronary artery. Mediastinum/Nodes: No mediastinal, hilar, or axillary adenopathy. Bilateral thyroid nodules are unchanged, compatible with multinodular goiter. Lungs/Pleura: Right base subsegmental atelectasis. Lungs otherwise clear. No effusions. Upper Abdomen: Multiple layering gallstones within the gallbladder. Bilateral nephrolithiasis noted in the upper poles. Musculoskeletal: Chest wall soft tissues are unremarkable. No acute bony abnormality. IMPRESSION: Stable 4.1 cm ascending thoracic aortic aneurysm. Recommend annual imaging followup by CTA or MRA. This recommendation follows 2010 ACCF/AHA/AATS/ACR/ASA/SCA/SCAI/SIR/STS/SVM Guidelines for the Diagnosis and  Management of Patients with Thoracic Aortic Disease. Circulation. 2010; 121: B284-X324 Cholelithiasis. Electronically Signed   By: Rolm Baptise M.D.   On: 02/20/2018 08:08   Ct Abdomen Pelvis W Contrast  Result Date: 03/12/2018 CLINICAL DATA:  Abdominal distension.  Possible hernia. EXAM: CT ABDOMEN AND PELVIS WITH CONTRAST TECHNIQUE: Multidetector CT imaging of the abdomen and pelvis was performed using the standard protocol following bolus administration of intravenous contrast. CONTRAST:  100 m ISOVUE-300 IOPAMIDOL (ISOVUE-300) INJECTION 61% COMPARISON:  CT pelvis 04/11/2017. FINDINGS: Lower chest: Lung bases are clear. No pleural effusion. Small pericardial effusion noted. Calcific coronary artery disease is seen. Hepatobiliary: Innumerable small stones are seen layering dependently in the gallbladder. No CT evidence of cholecystitis. The liver and biliary tree are unremarkable. Pancreas: Atrophic. No focal lesion or peripancreatic inflammatory change. Spleen: The spleen measures 16.5 cm.  No focal splenic lesion.  Adrenals/Urinary Tract: Innumerable bilateral nonobstructing renal stones are seen. Largest stone is in the upper pole of the left kidney and measures 2.2 cm craniocaudal. No ureteral stone. Bladder is decompressed with a Foley catheter in place. There is a urinary bladder stone measuring approximately 3 cm in diameter which is new since the prior exam. The adrenal glands are unremarkable. Stomach/Bowel: Left lower quadrant colostomy is seen. Colon is otherwise unremarkable. The appendix is not visualized but no evidence of appendicitis is seen. Stomach and small bowel appear normal. Vascular/Lymphatic: The patient has extensive lymphadenopathy throughout the abdomen and pelvis. Index right paravertebral node on image 44 measures 1.2 by 2.7 cm. Index left para-aortic nodal mass on image 48 measures 3.4 x 3.2 cm. Left pelvic sidewall lymph node which had measured 1.2 by 2.4 cm on the prior CT  measures 2.1 x 3.8 cm on image 76 today's examination. Left groin nodal mass which measures approximately 2.2 cm AP on the prior CT today measures 4.5 cm AP on image 83. Reproductive: Status post hysterectomy. No adnexal masses. Other: Diffuse body wall edema is present. Musculoskeletal: The majority of the sacrum is not visualized most consistent with osteolysis related to prior infection. Mild T8, T10 and L2 compression fractures appear remote. Fracture fixation hardware in both hips noted. IMPRESSION: Extensive retroperitoneal lymphadenopathy with marked increase in the size of a left inguinal lymph node consistent with lymphoproliferative disease such as chronic lymphocytic leukemia. The patient's left groin lymph node should be easily amenable to biopsy. Splenomegaly is likely related to lymphoproliferative disease. Multiple bilateral nonobstructing renal stones. Urinary bladder stone is also identified and new since the prior CT. Multiple tiny gallstones without evidence of cholecystitis. Calcific aortic and coronary atherosclerosis. Body wall edema compatible with anasarca. Electronically Signed   By: Inge Rise M.D.   On: 03/12/2018 14:44   Dg Chest Port 1 View  Result Date: 03/14/2018 CLINICAL DATA:  Cough.  Sepsis. EXAM: PORTABLE CHEST 1 VIEW COMPARISON:  CT 02/19/2018, radiograph 09/01/2017 FINDINGS: Chronic elevation of right hemidiaphragm. Heart is normal in size with unchanged mediastinal contours. Improved bibasilar aeration from prior exam. No confluent airspace disease. Minimal cephalization of pulmonary vasculature. No pleural fluid or pneumothorax. Skin fold projects over the right chest. Left renal stones incidentally visualized in the upper abdomen. IMPRESSION: Mild cephalization of pulmonary vasculature consistent with vascular congestion. No focal consolidation or other acute chest finding. Electronically Signed   By: Jeb Levering M.D.   On: 03/14/2018 21:24    Echo on  03/17/2018 Study Conclusions  - Left ventricle: The cavity size was normal. Wall thickness was   increased in a pattern of mild LVH. Systolic function was mildly   to moderately reduced. The estimated ejection fraction was in the   range of 40% to 45%. Diffuse hypokinesis. The study is not   technically sufficient to allow evaluation of LV diastolic   function. - Mitral valve: Mildly thickened leaflets . There was trivial   regurgitation. - Tricuspid valve: There was trivial regurgitation. - Pulmonary arteries: PA peak pressure: 19 mm Hg (S). - Inferior vena cava: The vessel was normal in size. The   respirophasic diameter changes were in the normal range (>= 50%),   consistent with normal central venous pressure. - Pericardium, extracardiac: Small pericardial effusion. No   tamponade features.  Impressions:  - Compared to a prior study in 03/2017, the LVEF is higher at   40-45%.  Subjective: Patient seen and examined at bedside.  She feels  much better and wants to go home.  Her diarrhea has improved.  No overnight fever or vomiting.  Discharge Exam: Vitals:   03/18/18 0530 03/18/18 0800  BP: 101/69   Pulse: 79   Resp: 18   Temp: 97.7 F (36.5 C)   SpO2: 100% 100%   Vitals:   03/17/18 2123 03/17/18 2130 03/18/18 0530 03/18/18 0800  BP: (!) 83/58 98/60 101/69   Pulse: 79  79   Resp: 18  18   Temp: 98.1 F (36.7 C)  97.7 F (36.5 C)   TempSrc: Oral  Oral   SpO2: 100%  100% 100%  Weight:      Height:        General: Pt is alert, awake, not in acute distress Cardiovascular: Rate controlled, S1/S2 + Respiratory: Bilateral decreased breath sounds at bases Abdominal: Soft, NT, ND, bowel sounds + Extremities: no edema, no cyanosis    The results of significant diagnostics from this hospitalization (including imaging, microbiology, ancillary and laboratory) are listed below for reference.     Microbiology: Recent Results (from the past 240 hour(s))  Blood  Culture (routine x 2)     Status: None (Preliminary result)   Collection Time: 03/14/18  9:05 PM  Result Value Ref Range Status   Specimen Description BLOOD RIGHT HAND  Final   Special Requests   Final    BOTTLES DRAWN AEROBIC AND ANAEROBIC Blood Culture adequate volume   Culture NO GROWTH 4 DAYS  Final   Report Status PENDING  Incomplete  Blood Culture (routine x 2)     Status: None (Preliminary result)   Collection Time: 03/14/18 10:02 PM  Result Value Ref Range Status   Specimen Description BLOOD LEFT THUMB  Final   Special Requests   Final    BOTTLES DRAWN AEROBIC AND ANAEROBIC Blood Culture results may not be optimal due to an inadequate volume of blood received in culture bottles Performed at [facility], [address], [place], [place] [address]    Culture NO GROWTH 4 DAYS  Final   Report Status PENDING  Incomplete  Urine culture     Status: Abnormal   Collection Time: 03/15/18  2:01 AM  Result Value Ref Range Status   Specimen Description URINE, RANDOM  Final   Special Requests   Final    NONE Performed at [facility], [place] [address], [place], [place] [address]    Culture >=100,000 COLONIES/mL ENTEROCOCCUS FAECALIS (A)  Final   Report Status 03/17/2018 FINAL  Final   Organism ID, Bacteria ENTEROCOCCUS FAECALIS (A)  Final      Susceptibility   Enterococcus faecalis - MIC*    AMPICILLIN <=2 SENSITIVE Sensitive     LEVOFLOXACIN >=8 RESISTANT Resistant     NITROFURANTOIN <=16 SENSITIVE Sensitive     VANCOMYCIN >=32 RESISTANT Resistant     LINEZOLID 2 SENSITIVE Sensitive     * >=100,000 COLONIES/mL ENTEROCOCCUS FAECALIS  Gastrointestinal Panel by PCR , Stool     Status: None   Collection Time: 03/15/18  7:09 AM  Result Value Ref Range Status   Campylobacter species NOT DETECTED NOT DETECTED Final   Plesimonas shigelloides NOT DETECTED NOT DETECTED Final   Salmonella species NOT DETECTED NOT DETECTED Final   Yersinia enterocolitica NOT DETECTED NOT DETECTED  Final   Vibrio species NOT DETECTED NOT DETECTED Final   Vibrio cholerae NOT DETECTED NOT DETECTED Final   Enteroaggregative E coli (EAEC) NOT DETECTED NOT DETECTED Final  Enteropathogenic E coli (EPEC) NOT DETECTED NOT DETECTED Final   Enterotoxigenic E coli (ETEC) NOT DETECTED NOT DETECTED Final   Shiga like toxin producing E coli (STEC) NOT DETECTED NOT DETECTED Final   Shigella/Enteroinvasive E coli (EIEC) NOT DETECTED NOT DETECTED Final   Cryptosporidium NOT DETECTED NOT DETECTED Final   Cyclospora cayetanensis NOT DETECTED NOT DETECTED Final   Entamoeba histolytica NOT DETECTED NOT DETECTED Final   Giardia lamblia NOT DETECTED NOT DETECTED Final   Adenovirus F40/41 NOT DETECTED NOT DETECTED Final   Astrovirus NOT DETECTED NOT DETECTED Final   Norovirus GI/GII NOT DETECTED NOT DETECTED Final   Rotavirus A NOT DETECTED NOT DETECTED Final   Sapovirus (I, II, IV, and V) NOT DETECTED NOT DETECTED Final    Comment: Performed at [facility], [address] [place], [place] [address]  C difficile quick scan w PCR reflex     Status: None   Collection Time: 03/15/18  8:43 AM  Result Value Ref Range Status   C Diff antigen NEGATIVE NEGATIVE Final   C Diff toxin NEGATIVE NEGATIVE Final   C Diff interpretation No C. difficile detected.  Final    Comment: Performed at [facility], [place] [address], [place], [place] [address]     Labs: BNP (last 3 results) Recent Labs    08/31/17 0507 03/15/18 1137 03/16/18 0344  BNP 49.9 52.6 78.6   Basic Metabolic Panel: Recent Labs  Lab 03/12/18 1306 03/14/18 2138 03/15/18 1137 03/16/18 0344 03/18/18 0541  NA 134* 135  --  134* 136  K 3.8 3.8  --  3.6 3.5  CL 98* 104  --  105 106  CO2 27 23  --  21* 24  GLUCOSE 100* 93  --  86 82  BUN 29* 22*  --  16 21*  CREATININE 0.44 0.50 0.52 0.52 0.48  CALCIUM 11.6* 10.4*  --  9.9 10.2  MG  --   --   --   --  1.3*   Liver Function Tests: Recent Labs  Lab 03/12/18 1306  03/14/18 2138  AST 27 21  ALT 18 17  ALKPHOS 96 88  BILITOT 0.5 0.4  PROT 6.6 5.7*  ALBUMIN 3.3* 2.8*   Recent Labs  Lab 03/14/18 2138  LIPASE 19   No results for input(s): AMMONIA in the last 168 hours. CBC: Recent Labs  Lab 03/12/18 1306 03/14/18 2138 03/15/18 1137 03/16/18 0344 03/18/18 0541  WBC 8.1 8.2 8.8 9.0 7.5  NEUTROABS 4.5 4.7  --   --   --   HGB 10.3* 9.0* 10.1* 9.9* 9.3*  HCT 35.0* 31.6* 35.9* 34.2* 32.1*  MCV 74.8* 75.4* 76.7* 75.2* 75.7*  PLT 311 311 345 362 271   Cardiac Enzymes: No results for input(s): CKTOTAL, CKMB, CKMBINDEX, TROPONINI in the last 168 hours. BNP: Invalid input(s): POCBNP CBG: Recent Labs  Lab 03/15/18 2134 03/16/18 0752 03/16/18 1244 03/16/18 1725 03/16/18 2356  GLUCAP 123* 79 85 83 86   D-Dimer No results for input(s): DDIMER in the last 72 hours. Hgb A1c No results for input(s): HGBA1C in the last 72 hours. Lipid Profile No results for input(s): CHOL, HDL, LDLCALC, TRIG, CHOLHDL, LDLDIRECT in the last 72 hours. Thyroid function studies No results for input(s): TSH, T4TOTAL, T3FREE, THYROIDAB in the last 72 hours.  Invalid input(s): FREET3 Anemia work up No results for input(s): VITAMINB12, FOLATE, FERRITIN, TIBC, IRON, RETICCTPCT in the last 72 hours. Urinalysis    Component Value Date/Time  COLORURINE YELLOW 03/15/2018 0217   APPEARANCEUR CLOUDY (A) 03/15/2018 0217   LABSPEC 1.010 03/15/2018 0217   PHURINE 7.0 03/15/2018 0217   GLUCOSEU NEGATIVE 03/15/2018 0217   HGBUR MODERATE (A) 03/15/2018 0217   BILIRUBINUR NEGATIVE 03/15/2018 0217   KETONESUR NEGATIVE 03/15/2018 0217   PROTEINUR 30 (A) 03/15/2018 0217   NITRITE NEGATIVE 03/15/2018 0217   LEUKOCYTESUR LARGE (A) 03/15/2018 0217   Sepsis Labs Invalid input(s): PROCALCITONIN,  WBC,  LACTICIDVEN Microbiology Recent Results (from the past 240 hour(s))  Blood Culture (routine x 2)     Status: None (Preliminary result)   Collection Time: 03/14/18  9:05  PM  Result Value Ref Range Status   Specimen Description BLOOD RIGHT HAND  Final   Special Requests   Final    BOTTLES DRAWN AEROBIC AND ANAEROBIC Blood Culture adequate volume   Culture NO GROWTH 4 DAYS  Final   Report Status PENDING  Incomplete  Blood Culture (routine x 2)     Status: None (Preliminary result)   Collection Time: 03/14/18 10:02 PM  Result Value Ref Range Status   Specimen Description BLOOD LEFT THUMB  Final   Special Requests   Final    BOTTLES DRAWN AEROBIC AND ANAEROBIC Blood Culture results may not be optimal due to an inadequate volume of blood received in culture bottles Performed at [facility], [address] [place], [place] [address]    Culture NO GROWTH 4 DAYS  Final   Report Status PENDING  Incomplete  Urine culture     Status: Abnormal   Collection Time: 03/15/18  2:01 AM  Result Value Ref Range Status   Specimen Description URINE, RANDOM  Final   Special Requests   Final    NONE Performed at [facility], [place] [address], [place], [place] [address]    Culture >=100,000 COLONIES/mL ENTEROCOCCUS FAECALIS (A)  Final   Report Status 03/17/2018 FINAL  Final   Organism ID, Bacteria ENTEROCOCCUS FAECALIS (A)  Final      Susceptibility   Enterococcus faecalis - MIC*    AMPICILLIN <=2 SENSITIVE Sensitive     LEVOFLOXACIN >=8 RESISTANT Resistant     NITROFURANTOIN <=16 SENSITIVE Sensitive     VANCOMYCIN >=32 RESISTANT Resistant     LINEZOLID 2 SENSITIVE Sensitive     * >=100,000 COLONIES/mL ENTEROCOCCUS FAECALIS  Gastrointestinal Panel by PCR , Stool     Status: None   Collection Time: 03/15/18  7:09 AM  Result Value Ref Range Status   Campylobacter species NOT DETECTED NOT DETECTED Final   Plesimonas shigelloides NOT DETECTED NOT DETECTED Final   Salmonella species NOT DETECTED NOT DETECTED Final   Yersinia enterocolitica NOT DETECTED NOT DETECTED Final   Vibrio species NOT DETECTED NOT DETECTED Final   Vibrio cholerae NOT DETECTED NOT  DETECTED Final   Enteroaggregative E coli (EAEC) NOT DETECTED NOT DETECTED Final   Enteropathogenic E coli (EPEC) NOT DETECTED NOT DETECTED Final   Enterotoxigenic E coli (ETEC) NOT DETECTED NOT DETECTED Final   Shiga like toxin producing E coli (STEC) NOT DETECTED NOT DETECTED Final   Shigella/Enteroinvasive E coli (EIEC) NOT DETECTED NOT DETECTED Final   Cryptosporidium NOT DETECTED NOT DETECTED Final   Cyclospora cayetanensis NOT DETECTED NOT DETECTED Final   Entamoeba histolytica NOT DETECTED NOT DETECTED Final   Giardia lamblia NOT DETECTED NOT DETECTED Final   Adenovirus F40/41 NOT DETECTED NOT DETECTED Final   Astrovirus NOT DETECTED NOT DETECTED Final   Norovirus GI/GII NOT  DETECTED NOT DETECTED Final   Rotavirus A NOT DETECTED NOT DETECTED Final   Sapovirus (I, II, IV, and V) NOT DETECTED NOT DETECTED Final    Comment: Performed at [facility], [address] [place], [place] [address]  C difficile quick scan w PCR reflex     Status: None   Collection Time: 03/15/18  8:43 AM  Result Value Ref Range Status   C Diff antigen NEGATIVE NEGATIVE Final   C Diff toxin NEGATIVE NEGATIVE Final   C Diff interpretation No C. difficile detected.  Final    Comment: Performed at [facility], [place] [address] [place], [place] [address]     Time coordinating discharge: 35 minutes  SIGNED:   Aline August, MD  [facility] 03/18/2018, 2:56 PM Pager: [phone]  If 7PM-7AM, please contact night-coverage [email or web address] Password [email or web address]

## 2018-03-18 NOTE — Plan of Care (Signed)
  Problem: Clinical Measurements: Goal: Ability to maintain clinical measurements within normal limits will improve Outcome: Progressing Goal: Respiratory complications will improve Outcome: Progressing   Problem: Elimination: Goal: Will not experience complications related to bowel motility Outcome: Progressing

## 2018-03-19 ENCOUNTER — Ambulatory Visit: Payer: BLUE CROSS/BLUE SHIELD | Admitting: Hematology

## 2018-03-19 LAB — CULTURE, BLOOD (ROUTINE X 2)
Culture: NO GROWTH
Culture: NO GROWTH
Special Requests: ADEQUATE

## 2018-03-25 ENCOUNTER — Telehealth: Payer: Self-pay

## 2018-03-25 NOTE — Telephone Encounter (Signed)
Phone call placed to patient to offer to schedule a visit with Palliative care. Unable to leave message as mailbox is full.

## 2018-03-25 NOTE — Telephone Encounter (Signed)
Received return call from patient. Education provided regarding palliative care. Visit scheduled for 04/03/18.

## 2018-04-03 ENCOUNTER — Other Ambulatory Visit: Payer: Medicare Other | Admitting: Internal Medicine

## 2018-04-03 DIAGNOSIS — Z515 Encounter for palliative care: Secondary | ICD-10-CM

## 2018-04-05 NOTE — Progress Notes (Signed)
2:15 pm: TC to patient for initial palliative care consult visit; call disconnected twice. When I arrived to the home, patient mentioned via home intercom that she did not wish to have Palliative Care visit at this time as was awaiting TV repair service to arrive. She was open to our calling next week to check if wanted to reschedule visit.

## 2018-04-22 ENCOUNTER — Telehealth: Payer: Self-pay

## 2018-04-22 NOTE — Telephone Encounter (Signed)
Spoke with patient's husband who stated that patient has declined palliative care.

## 2018-04-24 ENCOUNTER — Other Ambulatory Visit (HOSPITAL_COMMUNITY): Payer: Self-pay | Admitting: Family Medicine

## 2018-04-24 DIAGNOSIS — R590 Localized enlarged lymph nodes: Secondary | ICD-10-CM

## 2018-05-04 ENCOUNTER — Other Ambulatory Visit (HOSPITAL_COMMUNITY): Payer: Self-pay | Admitting: Family Medicine

## 2018-05-04 DIAGNOSIS — R599 Enlarged lymph nodes, unspecified: Secondary | ICD-10-CM

## 2018-05-04 DIAGNOSIS — R590 Localized enlarged lymph nodes: Secondary | ICD-10-CM

## 2018-05-06 ENCOUNTER — Encounter (HOSPITAL_COMMUNITY): Payer: Self-pay

## 2018-05-06 ENCOUNTER — Encounter: Payer: Self-pay | Admitting: Hematology

## 2018-05-06 ENCOUNTER — Other Ambulatory Visit: Payer: Self-pay

## 2018-05-06 ENCOUNTER — Inpatient Hospital Stay (HOSPITAL_COMMUNITY)
Admission: EM | Admit: 2018-05-06 | Discharge: 2018-05-08 | DRG: 698 | Disposition: A | Discharge status: Home Health | Payer: Medicare Other | Attending: Internal Medicine | Admitting: Internal Medicine

## 2018-05-06 ENCOUNTER — Emergency Department (HOSPITAL_COMMUNITY): Payer: Medicare Other

## 2018-05-06 ENCOUNTER — Telehealth: Payer: Self-pay | Admitting: Hematology

## 2018-05-06 DIAGNOSIS — A419 Sepsis, unspecified organism: Secondary | ICD-10-CM | POA: Diagnosis present

## 2018-05-06 DIAGNOSIS — D509 Iron deficiency anemia, unspecified: Secondary | ICD-10-CM | POA: Diagnosis present

## 2018-05-06 DIAGNOSIS — Z7982 Long term (current) use of aspirin: Secondary | ICD-10-CM

## 2018-05-06 DIAGNOSIS — Y846 Urinary catheterization as the cause of abnormal reaction of the patient, or of later complication, without mention of misadventure at the time of the procedure: Secondary | ICD-10-CM | POA: Diagnosis present

## 2018-05-06 DIAGNOSIS — G8220 Paraplegia, unspecified: Secondary | ICD-10-CM | POA: Diagnosis present

## 2018-05-06 DIAGNOSIS — I959 Hypotension, unspecified: Secondary | ICD-10-CM | POA: Diagnosis not present

## 2018-05-06 DIAGNOSIS — Z881 Allergy status to other antibiotic agents status: Secondary | ICD-10-CM | POA: Diagnosis not present

## 2018-05-06 DIAGNOSIS — I5022 Chronic systolic (congestive) heart failure: Secondary | ICD-10-CM | POA: Diagnosis present

## 2018-05-06 DIAGNOSIS — T83511A Infection and inflammatory reaction due to indwelling urethral catheter, initial encounter: Principal | ICD-10-CM | POA: Diagnosis present

## 2018-05-06 DIAGNOSIS — Z96641 Presence of right artificial hip joint: Secondary | ICD-10-CM | POA: Diagnosis present

## 2018-05-06 DIAGNOSIS — L89109 Pressure ulcer of unspecified part of back, unspecified stage: Secondary | ICD-10-CM | POA: Diagnosis present

## 2018-05-06 DIAGNOSIS — B379 Candidiasis, unspecified: Secondary | ICD-10-CM | POA: Diagnosis present

## 2018-05-06 DIAGNOSIS — E440 Moderate protein-calorie malnutrition: Secondary | ICD-10-CM | POA: Diagnosis present

## 2018-05-06 DIAGNOSIS — M47816 Spondylosis without myelopathy or radiculopathy, lumbar region: Secondary | ICD-10-CM | POA: Diagnosis present

## 2018-05-06 DIAGNOSIS — N390 Urinary tract infection, site not specified: Secondary | ICD-10-CM | POA: Diagnosis present

## 2018-05-06 DIAGNOSIS — Z8542 Personal history of malignant neoplasm of other parts of uterus: Secondary | ICD-10-CM | POA: Diagnosis not present

## 2018-05-06 DIAGNOSIS — G4733 Obstructive sleep apnea (adult) (pediatric): Secondary | ICD-10-CM | POA: Diagnosis present

## 2018-05-06 DIAGNOSIS — Z880 Allergy status to penicillin: Secondary | ICD-10-CM

## 2018-05-06 DIAGNOSIS — Z933 Colostomy status: Secondary | ICD-10-CM

## 2018-05-06 DIAGNOSIS — Z79899 Other long term (current) drug therapy: Secondary | ICD-10-CM

## 2018-05-06 DIAGNOSIS — R591 Generalized enlarged lymph nodes: Secondary | ICD-10-CM | POA: Diagnosis present

## 2018-05-06 DIAGNOSIS — E119 Type 2 diabetes mellitus without complications: Secondary | ICD-10-CM | POA: Diagnosis present

## 2018-05-06 DIAGNOSIS — E43 Unspecified severe protein-calorie malnutrition: Secondary | ICD-10-CM | POA: Diagnosis present

## 2018-05-06 DIAGNOSIS — Z887 Allergy status to serum and vaccine status: Secondary | ICD-10-CM | POA: Diagnosis not present

## 2018-05-06 DIAGNOSIS — L89154 Pressure ulcer of sacral region, stage 4: Secondary | ICD-10-CM | POA: Diagnosis present

## 2018-05-06 DIAGNOSIS — I110 Hypertensive heart disease with heart failure: Secondary | ICD-10-CM | POA: Diagnosis present

## 2018-05-06 DIAGNOSIS — Z681 Body mass index (BMI) 19 or less, adult: Secondary | ICD-10-CM | POA: Diagnosis not present

## 2018-05-06 DIAGNOSIS — Z90710 Acquired absence of both cervix and uterus: Secondary | ICD-10-CM | POA: Diagnosis not present

## 2018-05-06 DIAGNOSIS — Z888 Allergy status to other drugs, medicaments and biological substances status: Secondary | ICD-10-CM | POA: Diagnosis not present

## 2018-05-06 LAB — COMPREHENSIVE METABOLIC PANEL
ALT: 15 U/L (ref 0–44)
AST: 20 U/L (ref 15–41)
Albumin: 3.1 g/dL — ABNORMAL LOW (ref 3.5–5.0)
Alkaline Phosphatase: 74 U/L (ref 38–126)
Anion gap: 11 (ref 5–15)
BUN: 47 mg/dL — AB (ref 8–23)
CHLORIDE: 102 mmol/L (ref 98–111)
CO2: 25 mmol/L (ref 22–32)
CREATININE: 0.47 mg/dL (ref 0.44–1.00)
Calcium: 11.3 mg/dL — ABNORMAL HIGH (ref 8.9–10.3)
GFR calc Af Amer: >60 mL/min (ref >60)
Glucose, Bld: 89 mg/dL (ref 70–99)
Potassium: 3.6 mmol/L (ref 3.5–5.1)
SODIUM: 138 mmol/L (ref 135–145)
Total Bilirubin: 0.4 mg/dL (ref 0.3–1.2)
Total Protein: 6.6 g/dL (ref 6.5–8.1)

## 2018-05-06 LAB — CBC WITH DIFFERENTIAL/PLATELET
Basophils Absolute: 0 10*3/uL (ref 0.0–0.1)
Basophils Relative: 0 %
EOS ABS: 0 10*3/uL (ref 0.0–0.7)
EOS PCT: 0 %
HCT: 33.7 % — ABNORMAL LOW (ref 36.0–46.0)
Hemoglobin: 10 g/dL — ABNORMAL LOW (ref 12.0–15.0)
LYMPHS ABS: 2.9 10*3/uL (ref 0.7–4.0)
Lymphocytes Relative: 36 %
MCH: 22 pg — AB (ref 26.0–34.0)
MCHC: 29.7 g/dL — AB (ref 30.0–36.0)
MCV: 74.1 fL — ABNORMAL LOW (ref 78.0–100.0)
MONO ABS: 0.6 10*3/uL (ref 0.1–1.0)
MONOS PCT: 8 %
Neutro Abs: 4.6 10*3/uL (ref 1.7–7.7)
Neutrophils Relative %: 56 %
PLATELETS: 466 10*3/uL — AB (ref 150–400)
RBC: 4.55 MIL/uL (ref 3.87–5.11)
RDW: 17.3 % — ABNORMAL HIGH (ref 11.5–15.5)
WBC: 8.1 10*3/uL (ref 4.0–10.5)

## 2018-05-06 LAB — URINALYSIS, ROUTINE W REFLEX MICROSCOPIC
Bilirubin Urine: NEGATIVE
GLUCOSE, UA: NEGATIVE mg/dL
Ketones, ur: NEGATIVE mg/dL
NITRITE: NEGATIVE
Protein, ur: 100 mg/dL — AB
RBC / HPF: >50 RBC/hpf — ABNORMAL HIGH (ref 0–5)
SPECIFIC GRAVITY, URINE: 1.012 (ref 1.005–1.030)
pH: 8 (ref 5.0–8.0)

## 2018-05-06 LAB — I-STAT CG4 LACTIC ACID, ED: LACTIC ACID, VENOUS: 1.63 mmol/L (ref 0.5–1.9)

## 2018-05-06 MED ORDER — VITAMIN D3 25 MCG (1000 UNIT) PO TABS
1000.0000 [IU] | ORAL_TABLET | Freq: Every day | ORAL | Status: DC
  Administered 2018-05-07 – 2018-05-08 (×2): 1000 [IU] via ORAL
  Filled 2018-05-06 (×2): qty 1

## 2018-05-06 MED ORDER — NYSTATIN 100000 UNIT/GM EX POWD
Freq: Three times a day (TID) | CUTANEOUS | Status: DC
Start: 2018-05-06 — End: 2018-05-08
  Administered 2018-05-06 – 2018-05-08 (×4): via TOPICAL
  Filled 2018-05-06: qty 15

## 2018-05-06 MED ORDER — SODIUM CHLORIDE 0.9 % IV SOLN: 1.0000 g | Freq: Once | INTRAVENOUS | Status: DC

## 2018-05-06 MED ORDER — ZINC SULFATE 220 (50 ZN) MG PO CAPS
220.0000 mg | ORAL_CAPSULE | Freq: Every day | ORAL | Status: DC
  Administered 2018-05-07 – 2018-05-08 (×2): 220 mg via ORAL
  Filled 2018-05-06 (×2): qty 1

## 2018-05-06 MED ORDER — NAPHAZOLINE-GLYCERIN 0.012-0.2 % OP SOLN
1.0000 [drp] | Freq: Four times a day (QID) | OPHTHALMIC | Status: DC | PRN
  Filled 2018-05-06: qty 15

## 2018-05-06 MED ORDER — ONDANSETRON HCL 4 MG PO TABS: 4.0000 mg | ORAL_TABLET | Freq: Four times a day (QID) | ORAL | Status: DC | PRN

## 2018-05-06 MED ORDER — ASPIRIN EC 81 MG PO TBEC
81.0000 mg | DELAYED_RELEASE_TABLET | Freq: Every day | ORAL | Status: DC
  Administered 2018-05-07 – 2018-05-08 (×2): 81 mg via ORAL
  Filled 2018-05-06 (×2): qty 1

## 2018-05-06 MED ORDER — SODIUM CHLORIDE 0.9 % IV BOLUS (SEPSIS): 1000.0000 mL | Freq: Once | INTRAVENOUS | Status: DC

## 2018-05-06 MED ORDER — ADULT MULTIVITAMIN W/MINERALS CH
1.0000 | ORAL_TABLET | Freq: Every day | ORAL | Status: DC
  Administered 2018-05-07 – 2018-05-08 (×2): 1 via ORAL
  Filled 2018-05-06 (×2): qty 1

## 2018-05-06 MED ORDER — FLUTICASONE PROPIONATE 50 MCG/ACT NA SUSP
1.0000 | Freq: Every day | NASAL | Status: DC | PRN
  Filled 2018-05-06: qty 16

## 2018-05-06 MED ORDER — ONDANSETRON HCL 4 MG/2ML IJ SOLN: 4.0000 mg | Freq: Four times a day (QID) | INTRAMUSCULAR | Status: DC | PRN

## 2018-05-06 MED ORDER — PRO-STAT SUGAR FREE PO LIQD
30.0000 mL | Freq: Every day | ORAL | Status: DC
  Administered 2018-05-07: 30 mL via ORAL
  Filled 2018-05-06: qty 30

## 2018-05-06 MED ORDER — HYDROCODONE-ACETAMINOPHEN 7.5-325 MG PO TABS: 1.0000 | ORAL_TABLET | Freq: Every day | ORAL | Status: DC | PRN

## 2018-05-06 MED ORDER — PREMIER PROTEIN SHAKE
11.0000 [oz_av] | Freq: Three times a day (TID) | ORAL | Status: DC | PRN
  Filled 2018-05-06: qty 325.31

## 2018-05-06 MED ORDER — SODIUM CHLORIDE 0.9 % IV SOLN
1.0000 g | Freq: Two times a day (BID) | INTRAVENOUS | Status: DC
  Administered 2018-05-07 – 2018-05-08 (×3): 1 g via INTRAVENOUS
  Filled 2018-05-06 (×5): qty 1

## 2018-05-06 MED ORDER — ACETAMINOPHEN 650 MG RE SUPP: 650.0000 mg | Freq: Four times a day (QID) | RECTAL | Status: DC | PRN

## 2018-05-06 MED ORDER — SODIUM CHLORIDE 0.9 % IV BOLUS
1000.0000 mL | Freq: Once | INTRAVENOUS | Status: AC
  Administered 2018-05-06: 1000 mL via INTRAVENOUS

## 2018-05-06 MED ORDER — ENOXAPARIN SODIUM 30 MG/0.3ML ~~LOC~~ SOLN
30.0000 mg | SUBCUTANEOUS | Status: DC
  Administered 2018-05-07: 30 mg via SUBCUTANEOUS
  Filled 2018-05-06 (×2): qty 0.3

## 2018-05-06 MED ORDER — ACETAMINOPHEN 325 MG PO TABS
650.0000 mg | ORAL_TABLET | Freq: Four times a day (QID) | ORAL | Status: DC | PRN
Start: 2018-05-06 — End: 2018-05-08

## 2018-05-06 MED ORDER — SODIUM CHLORIDE 0.9 % IV BOLUS (SEPSIS): 250.0000 mL | Freq: Once | INTRAVENOUS | Status: DC

## 2018-05-06 MED ORDER — FERROUS SULFATE 325 (65 FE) MG PO TABS
325.0000 mg | ORAL_TABLET | Freq: Every day | ORAL | Status: DC
  Administered 2018-05-07 – 2018-05-08 (×2): 325 mg via ORAL
  Filled 2018-05-06 (×2): qty 1

## 2018-05-06 NOTE — ED Notes (Signed)
Pt c/o of increased pain in back
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
## 2018-05-06 NOTE — Telephone Encounter (Signed)
New referral received from Dr. Dorthy Cooler for dx: inguinal and retroperetoneal LAD. Pt has been scheduled to see Dr. Irene Limbo on 8/20 at 11am after her bx on 8/13. Pt and her husband are aware to arrive 30 minutes early. Letter mailed.

## 2018-05-06 NOTE — ED Notes (Signed)
Pt and pt's husband states that home health nurse is concerned pt has "general organ failure".

## 2018-05-06 NOTE — ED Notes (Signed)
Turned and reposition pt at 2100

## 2018-05-06 NOTE — ED Notes (Signed)
ED TO INPATIENT HANDOFF REPORT  Name/Age/Gender Sharon Warner 67 y.o. female  Code Status    Code Status Orders  (From admission, onward)        Start     Ordered   05/06/18 2127  Full code  Continuous     05/06/18 2129    Code Status History    Date Active Date Inactive Code Status Order ID Comments User Context   03/15/2018 1000 03/18/2018 1538 Full Code [id number]  Lady Deutscher, MD ED   08/28/2017 2042 09/04/2017 0223 Full Code [id number]  Etta Quill, DO ED   04/04/2017 1750 04/16/2017 1419 Full Code [id number]  Lavina Hamman, MD ED   03/25/2016 0853 04/11/2016 1634 Full Code [id number]  Leandra Kern Inpatient   03/14/2016 0212 03/21/2016 1954 Full Code [id number]  Lily Kocher, MD Inpatient      Home/SNF/Other Home  Chief Complaint hypotension  Level of Care/Admitting Diagnosis ED Disposition    ED Disposition Condition [facility] Area: [facility] [[id number]]  Level of Care: Med-Surg [16]  Diagnosis: Catheter-associated urinary tract infection [facility]) [[id number]]  Admitting Physician: Doreatha Massed  Attending Physician: Etta Quill [phone]  Estimated length of stay: past midnight tomorrow  Certification:: I certify this patient will need inpatient services for at least 2 midnights  PT Class (Do Not Modify): Inpatient [101]  PT Acc Code (Do Not Modify): Private [1]       Medical History Past Medical History:  Diagnosis Date  . Acute on chronic systolic CHF (congestive heart failure) ([place])   . Anemia   . Anxiety   . Chronic heart failure ([place])   . Chronic indwelling Foley catheter    "changed q month; flush q MWF at home" (03/17/2018)  . Depression   . Endometrial cancer ([place]) 2001  . Enlarged thoracic aorta ([place]) 03/14/2016  . H/O paraplegia 08/2014   ascending paralysis unclear etiology  . Heart murmur   . Hypertension   . Lumbar spondylosis    "all over; especially my shoulders" (03/17/2018)  . Morbid  obesity ([place])   . OSA (obstructive sleep apnea)    "don't have to wear mask anymore" (03/17/2018)  . Sacral decubitus ulcer, stage IV ([place])   . Type 2 diabetes, diet controlled (HCC)     Allergies Allergies  Allergen Reactions  . Clindamycin/Lincomycin Diarrhea  . Influenza Vaccines Other (See Comments)    Patient lost feeling in her legs and that spread  . Levaquin [Levofloxacin In D5w] Nausea Only  . Penicillins Other (See Comments)    From childhood: Has patient had a PCN reaction causing immediate rash, facial/tongue/throat swelling, SOB or lightheadedness with hypotension: Unknown Has patient had a PCN reaction causing severe rash involving mucus membranes or skin necrosis: Unknown Has patient had a PCN reaction that required hospitalization: No Has patient had a PCN reaction occurring within the last 10 years: No If all of the above answers are "NO", then may proceed with Cephalosporin use.  . Simvastatin Other (See Comments)    Reaction not recalled; PATIENT STATED THAT SHE "WILL NOT TAKE"  . Statins Other (See Comments)    PATIENT STATED THAT SHE "WILL NOT TAKE" THESE    IV Location/Drains/Wounds Patient Lines/Drains/Airways Status   Active Line/Drains/Airways    Name:   Placement date:   Placement time:   Site:   Days:   Peripheral IV 05/06/18 Right Antecubital   05/06/18    1752  Antecubital   less than 1   Negative Pressure Wound Therapy   03/16/18    -    -   51   Colostomy LLQ   04/05/17    1959    LLQ   396   Urethral Catheter Susanna L, RN Non-latex 14 Fr.   03/15/18    0649    Non-latex   52   Pressure Injury 03/21/16 Stage IV - Full thickness tissue loss with exposed bone, tendon or muscle.   03/21/16    1330     776   Pressure Injury 03/16/18 Stage IV - Full thickness tissue loss with exposed bone, tendon or muscle. managed at home by husband and [facility] staff with NPWT dressing   03/16/18    1028     51   Wound / Incision (Open or Dehisced) 03/15/18  Thigh  Posterior;Proximal;Right   03/15/18    2000    Thigh   52   Wound / Incision (Open or Dehisced) 03/15/18 Back Right;Upper   03/15/18    2000    Back   52          Labs/Imaging Results for orders placed or performed during the hospital encounter of 05/06/18 (from the past 48 hour(s))  Comprehensive metabolic panel     Status: Abnormal   Collection Time: 05/06/18  3:12 PM  Result Value Ref Range   Sodium 138 135 - 145 mmol/L   Potassium 3.6 3.5 - 5.1 mmol/L   Chloride 102 98 - 111 mmol/L   CO2 25 22 - 32 mmol/L   Glucose, Bld 89 70 - 99 mg/dL   BUN 47 (H) 8 - 23 mg/dL   Creatinine, Ser 0.47 0.44 - 1.00 mg/dL   Calcium 11.3 (H) 8.9 - 10.3 mg/dL   Total Protein 6.6 6.5 - 8.1 g/dL   Albumin 3.1 (L) 3.5 - 5.0 g/dL   AST 20 15 - 41 U/L   ALT 15 0 - 44 U/L   Alkaline Phosphatase 74 38 - 126 U/L   Total Bilirubin 0.4 0.3 - 1.2 mg/dL   GFR calc non Af Amer >60 >60 mL/min   GFR calc Af Amer >60 >60 mL/min    Comment: (NOTE) The eGFR has been calculated using the CKD EPI equation. This calculation has not been validated in all clinical situations. eGFR's persistently <60 mL/min signify possible Chronic Kidney Disease.    Anion gap 11 5 - 15    Comment: Performed at [facility], [place] [address] [place], [place] [address]  CBC WITH DIFFERENTIAL     Status: Abnormal   Collection Time: 05/06/18  3:12 PM  Result Value Ref Range   WBC 8.1 4.0 - 10.5 K/uL   RBC 4.55 3.87 - 5.11 MIL/uL   Hemoglobin 10.0 (L) 12.0 - 15.0 g/dL   HCT 33.7 (L) 36.0 - 46.0 %   MCV 74.1 (L) 78.0 - 100.0 fL   MCH 22.0 (L) 26.0 - 34.0 pg   MCHC 29.7 (L) 30.0 - 36.0 g/dL   RDW 17.3 (H) 11.5 - 15.5 %   Platelets 466 (H) 150 - 400 K/uL   Neutrophils Relative % 56 %   Neutro Abs 4.6 1.7 - 7.7 K/uL   Lymphocytes Relative 36 %   Lymphs Abs 2.9 0.7 - 4.0 K/uL   Monocytes Relative 8 %   Monocytes Absolute 0.6 0.1 - 1.0 K/uL   Eosinophils Relative 0 %   Eosinophils Absolute 0.0 0.0 -  0.7 K/uL    Basophils Relative 0 %   Basophils Absolute 0.0 0.0 - 0.1 K/uL    Comment: Performed at [facility], [place] [address], [place], [place] [address]  I-Stat CG4 Lactic Acid, ED  (not at  [facility])     Status: None   Collection Time: 05/06/18  6:05 PM  Result Value Ref Range   Lactic Acid, Venous 1.63 0.5 - 1.9 mmol/L  Urinalysis, Routine w reflex microscopic     Status: Abnormal   Collection Time: 05/06/18  7:42 PM  Result Value Ref Range   Color, Urine YELLOW YELLOW   APPearance CLOUDY (A) CLEAR   Specific Gravity, Urine 1.012 1.005 - 1.030   pH 8.0 5.0 - 8.0   Glucose, UA NEGATIVE NEGATIVE mg/dL   Hgb urine dipstick SMALL (A) NEGATIVE   Bilirubin Urine NEGATIVE NEGATIVE   Ketones, ur NEGATIVE NEGATIVE mg/dL   Protein, ur 100 (A) NEGATIVE mg/dL   Nitrite NEGATIVE NEGATIVE   Leukocytes, UA LARGE (A) NEGATIVE   RBC / HPF >50 (H) 0 - 5 RBC/hpf   WBC, UA 21-50 0 - 5 WBC/hpf   Bacteria, UA MANY (A) NONE SEEN   Squamous Epithelial / LPF 0-5 0 - 5   Mucus PRESENT    Amorphous Crystal PRESENT     Comment: Performed at [facility], [place] [address] [place], [place] [address]   Dg Chest 2 View  Result Date: 05/06/2018 CLINICAL DATA:  Weakness, hypotensive. EXAM: CHEST - 2 VIEW COMPARISON:  Chest radiograph March 14, 2018 FINDINGS: Cardiomediastinal silhouette is normal. No pleural effusions or focal consolidations. Punctate LEFT upper lobe granuloma. Trachea projects midline and there is no pneumothorax. Soft tissue planes and included osseous structures are non-suspicious. Broad dextroscoliosis. Osteopenia. Coarse calcification LEFT upper quadrant, probable nephrolithiasis. IMPRESSION: No active cardiopulmonary process. Electronically Signed   By: Elon Alas M.D.   On: 05/06/2018 17:18    Pending Labs Unresulted Labs (From admission, onward)   Start     Ordered   05/07/18 0500  CBC  Tomorrow morning,   R     05/06/18 2129   05/07/18 4656  Basic  metabolic panel  Tomorrow morning,   R     05/06/18 2129   05/06/18 2127  HIV antibody (Routine Testing)  Once,   R     05/06/18 2129   05/06/18 1514  Urine culture  STAT,   STAT     05/06/18 1516   05/06/18 1512  Blood Culture (routine x 2)  BLOOD CULTURE X 2,   STAT     05/06/18 1516      Vitals/Pain Today's Vitals   05/06/18 1600 05/06/18 1730 05/06/18 1823 05/06/18 2055  BP: 94/70 (!) 87/62 (!) 92/59 (!) 97/55  Pulse:  100 (!) 109 (!) 105  Resp:   (!) 23 (!) 21  Temp:    99 F (37.2 C)  TempSrc:    Oral  SpO2:  100% 100% 98%  Weight:      Height:      PainSc:        Isolation Precautions No active isolations  Medications Medications  acetaminophen (TYLENOL) tablet 650 mg (has no administration in time range)    Or  acetaminophen (TYLENOL) suppository 650 mg (has no administration in time range)  ondansetron (ZOFRAN) tablet 4 mg (has no administration in time range)    Or  ondansetron (ZOFRAN) injection 4 mg (has no administration in time range)  enoxaparin (LOVENOX) injection 30 mg (has no administration in time range)  nystatin (MYCOSTATIN/NYSTOP) topical powder (has no administration in time range)  meropenem (MERREM) 1 g in sodium chloride 0.9 % 100 mL IVPB (has no administration in time range)  aspirin EC tablet 81 mg (has no administration in time range)  HYDROcodone-acetaminophen (NORCO) 7.5-325 MG per tablet 1 tablet (has no administration in time range)  ferrous sulfate tablet 325 mg (has no administration in time range)  fluticasone (FLONASE) 50 MCG/ACT nasal spray 1 spray (has no administration in time range)  cholecalciferol (VITAMIN D) tablet 1,000 Units (has no administration in time range)  feeding supplement (PRO-STAT SUGAR FREE 64) liquid 30 mL (has no administration in time range)  protein supplement (PREMIER PROTEIN) liquid - approved for s/p bariatric surgery (has no administration in time range)  naphazoline-glycerin (CLEAR EYES REDNESS) ophth  solution 1 drop (has no administration in time range)  zinc sulfate capsule 220 mg (has no administration in time range)  multivitamin with minerals tablet 1 tablet (has no administration in time range)  sodium chloride 0.9 % bolus 1,000 mL (1,000 mLs Intravenous New Bag/Given 05/06/18 1912)    Mobility non-ambulatory
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
## 2018-05-06 NOTE — Progress Notes (Signed)
Pharmacy Antibiotic Note  Sharon Warner is a 67 y.o. female admitted on 05/06/2018 with UTI.  PMH significant for paraplegia, urinary catheter, CHF, HTN, DM. Recent UTI. Pharmacy has been consulted for Meropenem dosing.  Plan: Meropenem 1gm IV q12h F/u cultures Monitor renal function  Height: 5\' 1"  (154.9 cm) Weight: 88 lb (39.9 kg) IBW/kg (Calculated) : 47.8  Temp (24hrs), Avg:98.5 F (36.9 C), Min:97.9 F (36.6 C), Max:99 F (37.2 C)  Recent Labs  Lab 05/06/18 1512 05/06/18 1805  WBC 8.1  --   CREATININE 0.47  --   LATICACIDVEN  --  1.63    Estimated Creatinine Clearance: 44.2 mL/min (by C-G formula based on SCr of 0.47 mg/dL).    Allergies  Allergen Reactions  . Clindamycin/Lincomycin Diarrhea  . Influenza Vaccines Other (See Comments)    Patient lost feeling in her legs and that spread  . Levaquin [Levofloxacin In D5w] Nausea Only  . Penicillins Other (See Comments)    From childhood: Has patient had a PCN reaction causing immediate rash, facial/tongue/throat swelling, SOB or lightheadedness with hypotension: Unknown Has patient had a PCN reaction causing severe rash involving mucus membranes or skin necrosis: Unknown Has patient had a PCN reaction that required hospitalization: No Has patient had a PCN reaction occurring within the last 10 years: No If all of the above answers are "NO", then may proceed with Cephalosporin use.  . Simvastatin Other (See Comments)    Reaction not recalled; PATIENT STATED THAT SHE "WILL NOT TAKE"  . Statins Other (See Comments)    PATIENT STATED THAT SHE "WILL NOT TAKE" THESE    Antimicrobials this admission: 8/7 Meropenem >>    Dose adjustments this admission:    Microbiology results: 8/7 BCx: sent 8/7 UCx: sent   Thank you for allowing pharmacy to be a part of this patient's care.  Everette Rank, PharmD 05/06/2018 9:35 PM

## 2018-05-06 NOTE — ED Notes (Signed)
Bed: SD73 Expected date:  Expected time:  Means of arrival:  Comments: EMS- hypotension

## 2018-05-06 NOTE — ED Triage Notes (Signed)
EMS:  BP 84/58.  Pt c/o of weakness.  Pt hx of paraplegia.  Urinary catheter needs to be replaced today.  Pressure sores on back, her normal.  Pt usually has a wound vac for the pressure ulcers.  Pt was cool and clammy and to the touch.  Pt didn't finish ABX from UTI 2 weeks ago.  Pt A&O x4.

## 2018-05-07 ENCOUNTER — Other Ambulatory Visit: Payer: Self-pay

## 2018-05-07 DIAGNOSIS — A419 Sepsis, unspecified organism: Secondary | ICD-10-CM

## 2018-05-07 DIAGNOSIS — L89154 Pressure ulcer of sacral region, stage 4: Secondary | ICD-10-CM

## 2018-05-07 DIAGNOSIS — T83511A Infection and inflammatory reaction due to indwelling urethral catheter, initial encounter: Principal | ICD-10-CM

## 2018-05-07 DIAGNOSIS — E43 Unspecified severe protein-calorie malnutrition: Secondary | ICD-10-CM

## 2018-05-07 DIAGNOSIS — N390 Urinary tract infection, site not specified: Secondary | ICD-10-CM

## 2018-05-07 DIAGNOSIS — G8220 Paraplegia, unspecified: Secondary | ICD-10-CM

## 2018-05-07 DIAGNOSIS — R591 Generalized enlarged lymph nodes: Secondary | ICD-10-CM

## 2018-05-07 DIAGNOSIS — I5022 Chronic systolic (congestive) heart failure: Secondary | ICD-10-CM

## 2018-05-07 LAB — HIV ANTIBODY (ROUTINE TESTING W REFLEX): HIV SCREEN 4TH GENERATION: NONREACTIVE

## 2018-05-07 LAB — CBC
HEMATOCRIT: 31 % — AB (ref 36.0–46.0)
HEMOGLOBIN: 9.2 g/dL — AB (ref 12.0–15.0)
MCH: 22.1 pg — AB (ref 26.0–34.0)
MCHC: 29.7 g/dL — AB (ref 30.0–36.0)
MCV: 74.5 fL — AB (ref 78.0–100.0)
Platelets: 361 10*3/uL (ref 150–400)
RBC: 4.16 MIL/uL (ref 3.87–5.11)
RDW: 17.3 % — AB (ref 11.5–15.5)
WBC: 8.3 10*3/uL (ref 4.0–10.5)

## 2018-05-07 LAB — BASIC METABOLIC PANEL
Anion gap: 11 (ref 5–15)
BUN: 46 mg/dL — AB (ref 8–23)
CHLORIDE: 105 mmol/L (ref 98–111)
CO2: 21 mmol/L — AB (ref 22–32)
CREATININE: 0.42 mg/dL — AB (ref 0.44–1.00)
Calcium: 10.1 mg/dL (ref 8.9–10.3)
GFR calc Af Amer: >60 mL/min (ref >60)
GFR calc non Af Amer: >60 mL/min (ref >60)
GLUCOSE: 95 mg/dL (ref 70–99)
Potassium: 3.1 mmol/L — ABNORMAL LOW (ref 3.5–5.1)
Sodium: 137 mmol/L (ref 135–145)

## 2018-05-07 LAB — MAGNESIUM: MAGNESIUM: 1.4 mg/dL — AB (ref 1.7–2.4)

## 2018-05-07 MED ORDER — JUVEN PO PACK
1.0000 | PACK | Freq: Two times a day (BID) | ORAL | Status: DC
  Administered 2018-05-08: 1 via ORAL
  Filled 2018-05-07 (×3): qty 1

## 2018-05-07 MED ORDER — POTASSIUM CHLORIDE CRYS ER 20 MEQ PO TBCR
40.0000 meq | EXTENDED_RELEASE_TABLET | Freq: Once | ORAL | Status: DC
  Filled 2018-05-07: qty 2

## 2018-05-07 MED ORDER — ENOXAPARIN SODIUM 40 MG/0.4ML ~~LOC~~ SOLN
40.0000 mg | SUBCUTANEOUS | Status: DC
  Administered 2018-05-08: 40 mg via SUBCUTANEOUS
  Filled 2018-05-07: qty 0.4

## 2018-05-07 MED ORDER — POTASSIUM CHLORIDE 20 MEQ/15ML (10%) PO SOLN
40.0000 meq | Freq: Once | ORAL | Status: AC
  Administered 2018-05-07: 40 meq via ORAL
  Filled 2018-05-07: qty 30

## 2018-05-07 NOTE — Consult Note (Signed)
[facility] Nurse ostomy consult note Stoma type/location: LLQ end colostomy Stomal assessment/size: pink, seen through pouch which was placed last night. Peristomal assessment: NA Treatment options for stomal/peristomal skin:  Output brown effluent Ostomy pouching: 2pc. 2 3/4" Education provided: patient has had ostomy for years but discussed use of correctly fitting pouches. Pt has been using different pouches than recommended.  Enrolled patient in [place] program: NA Pt's heels unremarkable but she is bed bound and her prevalon boots she has are worn where velcro no longer sticks, will order a new pair. Pt already on an air mattress. Patient already on protein supplements.  [place] Nurse wound consult note Reason for Consult: stage 4 sacral injury Wound type:pressure Pressure Injury POA: Yes Measurement:5cm x 5cm x 1.2cm (hard to measure depth due to body habitus) Wound bed: pale pink, non granulating wound bed, all edges have epibole. Drainage (amount, consistency, odor) copious, thin yellow exudate, no odor noted Periwound:reddened from moisture, vac has been off due to leaking. Dressing procedure/placement/frequency:  NPWT dressing placed to the sacrum:  Filled wound with ___1___ piece of black foam. Bridged dressing to the right hip, protected the skin with drape and used one strip of black foam for bridge. Used  Barrier ring at distal end of wound to attain seal. Placed at 122mm Hg continuous suction. Tolerated well.  We will follow this patient and remain available to this patient, nursing, and the medical and surgical teams. If need assistance prior to next visit please re-consult. Fara Olden, RN-C, WTA-C, [facility] Wound Treatment Associate Ostomy Care Associate

## 2018-05-07 NOTE — Care Management Note (Signed)
Case Management Note  Patient Details  Name: Sharon Warner MRN: [id number] Date of Birth: 04-01-1951  Subjective/Objective:  Sharon Warner rep [facility] already following patient for return back home w/HHC-they are asking [id number]:[id number] weight, palliative vs hospice services, change f/c to [id number] notified.                  Action/Plan:d/c home w/HHC.   Expected Discharge Date:                  Expected Discharge Plan:  [place]  In-House Referral:     Discharge planning Services  CM Consult  Post Acute Care Choice:  Home Health(Active w/[facility]) Choice offered to:     DME Arranged:    DME Agency:     HH Arranged:    [place] Agency:     Status of Service:  In process, will continue to follow  If discussed at Long Length of Stay Meetings, dates discussed:    Additional Comments:  Dessa Phi, RN 05/07/2018, 4:17 PM

## 2018-05-07 NOTE — Progress Notes (Signed)
Initial Nutrition Assessment  DOCUMENTATION CODES:   Non-severe (moderate) malnutrition in context of chronic illness  INTERVENTION:    Provide Premier Protein TID, each supplement provides 160kcal and 30g protein.   Magic cup once/day with meals, each supplement provides 290 kcal and 9 grams of protein  Juven Fruit Punch BID, each serving provides 95kcal and 2.5g of protein (amino acids glutamine and arginine)  Provide MVI daily  NUTRITION DIAGNOSIS:   Moderate Malnutrition related to chronic illness(CHF/chronic wound) as evidenced by energy intake < 75% for > or equal to 1 month, moderate muscle depletion, severe muscle depletion.  GOAL:   Patient will meet greater than or equal to 90% of their needs  MONITOR:   PO intake, Supplement acceptance, Weight trends, Labs  REASON FOR ASSESSMENT:   Consult Wound healing  ASSESSMENT:   Patient with PMH significant for paraplegia, CHF, DM, endometrial cancer, and stage IV decubitus. Presents this admission with UTI and stage IV decubitus ulcer (with wound VAC).    Pt reports her intake has remained poor since her last admission 02/2018. States she drinks 3-4 premier protein shakes/day but only eats a couple bites of real food (fish, chicken). She tries to focus on high protein food options, but she is very picky. Discussed adding a high calorie high protein supplement along with Premier Protein but she does not like Ensure/Boost. Pt ate 100% of her french toast this morning without complication. She is amendable to [place].   Pt endorses a UBW of 109 lb and is unsure of any recent weight loss. Records indicate pt weighed 134 lb on 03/16/18 and 115 lb this admission. With pt's history of CHF it's hard to determine actual dry weight loss. Nutrition-Focused physical exam completed. Pt shows to have bilateral lower extremity muscle wasting which is to be expected with immobility. She is able to use her arms and often does  exercises. Given poor intake and upper extremity muscle depletion suspect pt remains malnourished.   Medications reviewed and include: Vit D, ferrous sulfate, MVI with minerals, zinc sulfate Labs reviewed: K 3.1 (L) Mg 1.4 (L)   NUTRITION - FOCUSED PHYSICAL EXAM:    Most Recent Value  Orbital Region  No depletion  Upper Arm Region  Moderate depletion  Thoracic and Lumbar Region  Unable to assess  Buccal Region  No depletion  Temple Region  Moderate depletion  Clavicle Bone Region  Severe depletion  Clavicle and Acromion Bone Region  Moderate depletion  Dorsal Hand  Moderate depletion  Patellar Region  Moderate depletion  Anterior Thigh Region  Moderate depletion  Posterior Calf Region  Moderate depletion  Edema (RD Assessment)  Mild  Hair  Reviewed  Eyes  Reviewed  Mouth  Reviewed  Skin  Reviewed  Nails  Reviewed     Diet Order:   Diet Order            Diet regular Room service appropriate? Yes; Fluid consistency: Thin  Diet effective now              EDUCATION NEEDS:   Education needs have been addressed  Skin:  Skin Assessment: Skin Integrity Issues: Skin Integrity Issues:: Stage IV, Stage III Stage III: back Stage IV: coccyx  Last BM:  05/07/18  Height:   Ht Readings from Last 1 Encounters:  05/06/18 5\' 1"  (1.549 m)    Weight:   Wt Readings from Last 1 Encounters:  05/06/18 52.5 kg    Ideal Body Weight:  47.7 kg  BMI:  Body mass index is 21.87 kg/m.  Estimated Nutritional Needs:   Kcal:  1550-1750 kcal  Protein:  80-90 grams  Fluid:  >/= 1.5 L/day    Sharon Warner RD, LDN Clinical Nutrition Pager # - [phone]

## 2018-05-07 NOTE — Progress Notes (Signed)
PROGRESS NOTE    Sharon Warner  JKD:[id number] DOB: 31-Mar-1951 DOA: 05/06/2018 PCP: Sharon Amel, MD   Brief Narrative:  HPI on 05/06/2018 by Dr. Jennette Kettle Sharon Warner is a 67 y.o. female with medical history significant of paraplegia, CHF with EF 40-45%, prior endometrial cancer, stage 4 sacral decubitus, malnutrition.  Patient reports her home health nurse came to evaluate her today and felt that she did not look well and had a low blood pressure of 84/58, patient reports her blood pressure is typically around 90/60, and patient was cool and clammy to the touch. Patient has not been febrile at home. Patient recently had a UTI and she is unsure if she completed her antibiotics, patient has had a chronic indwelling Foley catheter for greater than 1 year with history of recurrent urinary tract infections. Patient also has a colostomy, and reports some increased output over the past few days, but no blood or melanotic stools. She denies abdominal pain. Denies chest pain or shortness of breath, no cough. Assessment & Plan   Sepsis secondary to UTI associated with indwelling foley catheter (CAUTI) -Upon review of patient's chart, she was noted to have tachycardia as well as tachypnea with soft BP (87/62) .  However blood pressure per patient has normally around 90/60. -Have VRE on urine culture in June 2019 -UA showed many bacteria, 21-50 WBC, large leukocytes -Foley catheter was supposedly exchanged on admission -Given her penicillin allergy, she was placed on empiric meropenem -Blood and urine cultures pending  Stage IV decubitus ulcer -Wound care consulted and appreciated -Continue wound VAC care -Nystatin for candidiasis was started by wound care 2 days ago as per documentation in Care everywhere  Protein calorie malnutrition -Nutrition consulted -Continue supplements  Diffuse lymphadenopathy -Patient with prior history of cancer -Has biopsy scheduled on Tuesday,  05/12/2018 -Patient is supposed to follow-up with oncology  Paraplegia -Chronic and at baseline -Patient has aids and uses a Hoyer lift along with wheelchair  Chronic systolic CHF -Only held for possible sepsis and mild hypotension  Chronic microcytic anemia -Hemoglobin appears to be stable, continue to monitor CBC  DVT Prophylaxis  lovenox  Code Status: Full  Family Communication: None at bedside  Disposition Plan: Admitted. Suspect home when stable. Pending urine culture.  Consultants None  Procedures  None  Antibiotics   Anti-infectives (From admission, onward)   Start     Dose/Rate Route Frequency Ordered Stop   05/06/18 2200  meropenem (MERREM) 1 g in sodium chloride 0.9 % 100 mL IVPB     1 g 200 mL/hr over 30 Minutes Intravenous Every 12 hours 05/06/18 2135     05/06/18 2100  cefTRIAXone (ROCEPHIN) 1 g in sodium chloride 0.9 % 100 mL IVPB  Status:  Discontinued     1 g 200 mL/hr over 30 Minutes Intravenous  Once 05/06/18 2046 05/06/18 2052      Subjective:   Sharon Warner seen and examined today.  Has no complaints.  Feels her blood pressure is where it normally is.  Denies current chest pain, shortness breath, abdominal pain, nausea or vomiting, diarrhea or constipation.  States she always has recurrent urinary tract infections.    Objective:   Vitals:   05/06/18 1823 05/06/18 2055 05/06/18 2246 05/07/18 0555  BP: (!) 92/59 (!) 97/55 (!) 93/59 94/72  Pulse: (!) 109 (!) 105 89 86  Resp: (!) 23 (!) 21 16 14   Temp:  99 F (37.2 C) 98.6 F (37 C) 98.9 F (37.2 C)  TempSrc:  Oral Oral Oral  SpO2: 100% 98% 96% 99%  Weight:   52.5 kg   Height:   5\' 1"  (1.549 m)     Intake/Output Summary (Last 24 hours) at 05/07/2018 1133 Last data filed at 05/07/2018 0647 Gross per 24 hour  Intake 1100.4 ml  Output 251 ml  Net 849.4 ml   Filed Weights   05/06/18 1406 05/06/18 2246  Weight: 39.9 kg 52.5 kg    Exam  General: Well developed, chronically ill appearing,  NAD  HEENT: NCAT, mucous membranes moist.   Neck: Supple  Cardiovascular: S1 S2 auscultated, no rubs, murmurs or gallops. Regular rate and rhythm.  Respiratory: Clear to auscultation bilaterally with equal chest rise  Abdomen: Soft, nontender, nondistended, + bowel sounds, +colostomy   Extremities: warm dry without cyanosis clubbing or edema  Neuro: AAOx3, chronic lower ext paraplegia, otherwise nonfocal  Psych: Normal affect and demeanor with intact judgement and insight   Data Reviewed: I have personally reviewed following labs and imaging studies  CBC: Recent Labs  Lab 05/06/18 1512 05/07/18 0355  WBC 8.1 8.3  NEUTROABS 4.6  --   HGB 10.0* 9.2*  HCT 33.7* 31.0*  MCV 74.1* 74.5*  PLT 466* 734   Basic Metabolic Panel: Recent Labs  Lab 05/06/18 1512 05/07/18 0355  NA 138 137  K 3.6 3.1*  CL 102 105  CO2 25 21*  GLUCOSE 89 95  BUN 47* 46*  CREATININE 0.47 0.42*  CALCIUM 11.3* 10.1  MG  --  1.4*   GFR: Estimated Creatinine Clearance: 52.9 mL/min (A) (by C-G formula based on SCr of 0.42 mg/dL (L)). Liver Function Tests: Recent Labs  Lab 05/06/18 1512  AST 20  ALT 15  ALKPHOS 74  BILITOT 0.4  PROT 6.6  ALBUMIN 3.1*   No results for input(s): LIPASE, AMYLASE in the last 168 hours. No results for input(s): AMMONIA in the last 168 hours. Coagulation Profile: No results for input(s): INR, PROTIME in the last 168 hours. Cardiac Enzymes: No results for input(s): CKTOTAL, CKMB, CKMBINDEX, TROPONINI in the last 168 hours. BNP (last 3 results) No results for input(s): PROBNP in the last 8760 hours. HbA1C: No results for input(s): HGBA1C in the last 72 hours. CBG: No results for input(s): GLUCAP in the last 168 hours. Lipid Profile: No results for input(s): CHOL, HDL, LDLCALC, TRIG, CHOLHDL, LDLDIRECT in the last 72 hours. Thyroid Function Tests: No results for input(s): TSH, T4TOTAL, FREET4, T3FREE, THYROIDAB in the last 72 hours. Anemia Panel: No  results for input(s): VITAMINB12, FOLATE, FERRITIN, TIBC, IRON, RETICCTPCT in the last 72 hours. Urine analysis:    Component Value Date/Time   COLORURINE YELLOW 05/06/2018 1942   APPEARANCEUR CLOUDY (A) 05/06/2018 1942   LABSPEC 1.012 05/06/2018 1942   PHURINE 8.0 05/06/2018 1942   GLUCOSEU NEGATIVE 05/06/2018 1942   HGBUR SMALL (A) 05/06/2018 1942   BILIRUBINUR NEGATIVE 05/06/2018 1942   KETONESUR NEGATIVE 05/06/2018 1942   PROTEINUR 100 (A) 05/06/2018 1942   NITRITE NEGATIVE 05/06/2018 1942   LEUKOCYTESUR LARGE (A) 05/06/2018 1942   Sepsis Labs: @LABRCNTIP (procalcitonin:4,lacticidven:4)  )No results found for this or any previous visit (from the past 240 hour(s)).    Radiology Studies: Dg Chest 2 View  Result Date: 05/06/2018 CLINICAL DATA:  Weakness, hypotensive. EXAM: CHEST - 2 VIEW COMPARISON:  Chest radiograph March 14, 2018 FINDINGS: Cardiomediastinal silhouette is normal. No pleural effusions or focal consolidations. Punctate LEFT upper lobe granuloma. Trachea projects midline and there is no pneumothorax. Soft  tissue planes and included osseous structures are non-suspicious. Broad dextroscoliosis. Osteopenia. Coarse calcification LEFT upper quadrant, probable nephrolithiasis. IMPRESSION: No active cardiopulmonary process. Electronically Signed   By: Elon Alas M.D.   On: 05/06/2018 17:18     Scheduled Meds: . aspirin EC  81 mg Oral Daily  . cholecalciferol  1,000 Units Oral Daily  . enoxaparin (LOVENOX) injection  30 mg Subcutaneous Q24H  . feeding supplement (PRO-STAT SUGAR FREE 64)  30 mL Oral Daily  . ferrous sulfate  325 mg Oral Daily  . multivitamin with minerals  1 tablet Oral Daily  . nystatin   Topical TID  . potassium chloride  40 mEq Oral Once  . zinc sulfate  220 mg Oral Daily   Continuous Infusions: . meropenem (MERREM) IV Stopped (05/07/18 0124)     LOS: 1 day   Time Spent in minutes   30 minutes  Filbert Craze D.O. on 05/07/2018 at 11:33  AM  Between 7am to 7pm - Pager - [phone]  After 7pm go to [email or web address] - password [email or web address]  And look for the night coverage person covering for me after hours  [facility] Office  [phone]

## 2018-05-08 DIAGNOSIS — E440 Moderate protein-calorie malnutrition: Secondary | ICD-10-CM

## 2018-05-08 LAB — URINE CULTURE

## 2018-05-08 MED ORDER — NYSTATIN 100000 UNIT/GM EX POWD: Freq: Three times a day (TID) | CUTANEOUS | 0 refills | Status: AC

## 2018-05-08 MED ORDER — NITROFURANTOIN MONOHYD MACRO 100 MG PO CAPS: 100.0000 mg | ORAL_CAPSULE | Freq: Two times a day (BID) | ORAL | 0 refills | Status: DC

## 2018-05-08 MED ORDER — NITROFURANTOIN MONOHYD MACRO 100 MG PO CAPS
100.0000 mg | ORAL_CAPSULE | Freq: Two times a day (BID) | ORAL | Status: DC
  Filled 2018-05-08: qty 1

## 2018-05-08 NOTE — Consult Note (Signed)
[place] Nurse wound follow up As I was walking to the door of the patient in [facility] room 1609, EMS attendants had her on a stretcher, rolling her down the hall for discharge.  Therefore, VAC dressing was not changed. Val Riles, RN, MSN, CWOCN, CNS-BC, pager [phone]

## 2018-05-08 NOTE — Discharge Summary (Addendum)
Physician Discharge Summary  Idalys Konecny PNT:[id number] DOB: 1951/07/19 DOA: 05/06/2018  PCP: [name], MD  Admit date: 05/06/2018 Discharge date: 05/08/2018  Time spent: 45 minutes  Recommendations for Outpatient Follow-up:  Patient will be discharged to home.  Patient will need to follow up with primary care provider within one week of discharge.  Patient should continue medications as prescribed.  Patient should follow a regular diet.   Discharge Diagnoses:  Sepsis secondary to UTI associated with indwelling foley catheter (CAUTI) Stage IV decubitus ulcer Moderate Protein calorie malnutrition Diffuse lymphadenopathy Paraplegia Chronic systolic CHF Chronic microcytic anemia  Discharge Condition: Stable  Diet recommendation: Regular  Filed Weights   05/06/18 1406 05/06/18 2246 05/07/18 1800  Weight: 39.9 kg 52.5 kg 47.6 kg    History of present illness:  on 05/06/2018 by Dr. Jennette Kettle Marilynn Rail a 67 y.o.femalewith medical history significant ofparaplegia, CHF with EF 40-45%, prior endometrial cancer, stage 4 sacral decubitus, malnutrition.  Patient reports her home health nurse came to evaluate her today and felt that she did not look well and had a low blood pressure of 84/58, patient reports her blood pressure is typically around 90/60, and patient was cool and clammy to the touch. Patient has not been febrile at home. Patient recently had a UTI and she is unsure if she completed her antibiotics, patient has had a chronic indwelling Foley catheter for greater than 1 year with history of recurrent urinary tract infections. Patient also has a colostomy, and reports some increased output over the past few days, but no blood or melanotic stools. She denies abdominal pain. Denies chest pain or shortness of breath, no cough.  Hospital Course:  Sepsis secondary to UTI associated with indwelling foley catheter (CAUTI) -Upon review of patient's chart, she was  noted to have tachycardia as well as tachypnea with soft BP (87/62) .  However blood pressure per patient has normally around 90/60. -Have VRE on urine culture in June 2019-sensitive to ampicillin, linezolid, nitrofurantoin -UA showed many bacteria, 21-50 WBC, large leukocytes -Urine culture shows multiple species -Blood cultures show no growth to date -Foley catheter was supposedly exchanged on admission -Given her penicillin allergy, she was placed on empiric meropenem -Discussed with infectious disease, Dr. Baxter Flattery, via phone, recommended nitrofurantoin for the remainder of antibiotic course. -Will discharge patient with additional 4 days of Macrobid.  Stage IV decubitus ulcer -Wound care consulted and appreciated -Continue wound VAC care; changed on 05/07/2018 -Nystatin for candidiasis was started by wound care 2 days prior to admission as per documentation in Care everywhere  Moderate Protein calorie malnutrition -Nutrition consulted -Continue supplements  Diffuse lymphadenopathy -Patient with prior history of cancer -Has biopsy scheduled on Tuesday, 05/12/2018 -Patient is supposed to follow-up with oncology  Paraplegia -Chronic and at baseline -Patient has aids and uses a Hoyer lift along with wheelchair  Chronic systolic CHF -Only held for possible sepsis and mild hypotension  Chronic microcytic anemia -Hemoglobin appears to be stable, continue to monitor CBC  Procedures: None  Consultations: Infectious disease, via phone  Discharge Exam: Vitals:   05/07/18 2102 05/08/18 0508  BP:  90/62  Pulse: 82 82  Resp: 20 20  Temp: 98.7 F (37.1 C) (!) 97.5 F (36.4 C)  SpO2: 95% 95%     General: Well developed, well nourished, NAD, appears stated age  HEENT: NCAT, PERRLA, EOMI, Anicteic Sclera, mucous membranes moist.  Neck: Supple, no JVD, no masses  Cardiovascular: S1 S2 auscultated, no rubs, murmurs or gallops. Regular  rate and rhythm.  Respiratory:  Clear to auscultation bilaterally with equal chest rise  Abdomen: Soft, nontender, nondistended, + bowel sounds  Extremities: warm dry without cyanosis clubbing or edema  Neuro: AAOx3, cranial nerves grossly intact. Strength 5/5 in patient's upper and lower extremities bilaterally  Skin: Without rashes exudates or nodules  Psych: Normal affect and demeanor with intact judgement and insight  Discharge Instructions Discharge Instructions    Discharge instructions   Complete by:  As directed    Patient will be discharged to home.  Patient will need to follow up with primary care provider within one week of discharge.  Patient should continue medications as prescribed.  Patient should follow a regular diet.     Allergies as of 05/08/2018      Reactions   Clindamycin/lincomycin Diarrhea   Influenza Vaccines Other (See Comments)   Patient lost feeling in her legs and that spread   Levaquin [levofloxacin In D5w] Nausea Only   Penicillins Other (See Comments)   From childhood: Has patient had a PCN reaction causing immediate rash, facial/tongue/throat swelling, SOB or lightheadedness with hypotension: Unknown Has patient had a PCN reaction causing severe rash involving mucus membranes or skin necrosis: Unknown Has patient had a PCN reaction that required hospitalization: No Has patient had a PCN reaction occurring within the last 10 years: No If all of the above answers are "NO", then may proceed with Cephalosporin use.   Simvastatin Other (See Comments)   Reaction not recalled; PATIENT STATED THAT SHE "WILL NOT TAKE"   Statins Other (See Comments)   PATIENT STATED THAT SHE "WILL NOT TAKE" THESE      Medication List    TAKE these medications   aspirin EC 81 MG tablet Take 81 mg by mouth daily.   BIOFREEZE 4 % Gel Generic drug:  Menthol (Topical Analgesic) Apply 1 application topically See admin instructions. APPLY 1-3 TIMES A DAY TO PAINFUL SITES   cholecalciferol 1000 units  tablet Commonly known as:  VITAMIN D Take 1,000 Units by mouth daily.   CRANBERRY PO Take 1 capsule by mouth daily.   feeding supplement (PRO-STAT SUGAR FREE 64) Liqd Take 30 mLs by mouth daily.   ferrous sulfate 325 (65 FE) MG tablet Take 325 mg by mouth daily.   fluticasone 50 MCG/ACT nasal spray Commonly known as:  FLONASE Place 1 spray into both nostrils daily as needed for allergies.   furosemide 20 MG tablet Commonly known as:  LASIX Take 20 mg by mouth daily.   HYDROcodone-acetaminophen 7.5-325 MG tablet Commonly known as:  NORCO Take 1 tablet by mouth daily as needed (for severe pain).   multivitamin with minerals Tabs tablet Take 1 tablet by mouth daily.   nitrofurantoin (macrocrystal-monohydrate) 100 MG capsule Commonly known as:  MACROBID Take 1 capsule (100 mg total) by mouth every 12 (twelve) hours.   nystatin powder Commonly known as:  MYCOSTATIN/NYSTOP Apply topically 3 (three) times daily.   ondansetron 4 MG tablet Commonly known as:  ZOFRAN Take 1 tablet (4 mg total) by mouth every 6 (six) hours as needed for nausea.   potassium chloride 10 MEQ tablet Commonly known as:  K-DUR Take 10 mEq by mouth daily.   protein supplement shake Liqd Commonly known as:  PREMIER PROTEIN Take 11 oz by mouth 3 (three) times daily as needed (protein supplement).   VISINE 0.05 % ophthalmic solution Generic drug:  tetrahydrozoline Place 1 drop into both eyes 2 (two) times daily as needed.  VITAMIN C PO Take 1 tablet by mouth daily.   zinc sulfate 220 (50 Zn) MG capsule Take 220 mg by mouth daily.      Allergies  Allergen Reactions  . Clindamycin/Lincomycin Diarrhea  . Influenza Vaccines Other (See Comments)    Patient lost feeling in her legs and that spread  . Levaquin [Levofloxacin In D5w] Nausea Only  . Penicillins Other (See Comments)    From childhood: Has patient had a PCN reaction causing immediate rash, facial/tongue/throat swelling, SOB or  lightheadedness with hypotension: Unknown Has patient had a PCN reaction causing severe rash involving mucus membranes or skin necrosis: Unknown Has patient had a PCN reaction that required hospitalization: No Has patient had a PCN reaction occurring within the last 10 years: No If all of the above answers are "NO", then may proceed with Cephalosporin use.  . Simvastatin Other (See Comments)    Reaction not recalled; PATIENT STATED THAT SHE "WILL NOT TAKE"  . Statins Other (See Comments)    PATIENT STATED THAT SHE "WILL NOT TAKE" THESE   Follow-up Information    Koirala, Dibas, MD. Schedule an appointment as soon as possible for a visit in 1 week(s).   Specialty:  Family Medicine Why:  Hospital follow up Contact information: [address] [address] [phone]            The results of significant diagnostics from this hospitalization (including imaging, microbiology, ancillary and laboratory) are listed below for reference.    Significant Diagnostic Studies: Dg Chest 2 View  Result Date: 05/06/2018 CLINICAL DATA:  Weakness, hypotensive. EXAM: CHEST - 2 VIEW COMPARISON:  Chest radiograph March 14, 2018 FINDINGS: Cardiomediastinal silhouette is normal. No pleural effusions or focal consolidations. Punctate LEFT upper lobe granuloma. Trachea projects midline and there is no pneumothorax. Soft tissue planes and included osseous structures are non-suspicious. Broad dextroscoliosis. Osteopenia. Coarse calcification LEFT upper quadrant, probable nephrolithiasis. IMPRESSION: No active cardiopulmonary process. Electronically Signed   By: Elon Alas M.D.   On: 05/06/2018 17:18    Microbiology: Recent Results (from the past 240 hour(s))  Blood Culture (routine x 2)     Status: None (Preliminary result)   Collection Time: 05/06/18  3:12 PM  Result Value Ref Range Status   Specimen Description   Final    BLOOD RIGHT ANTECUBITAL Performed at [facility] [address], [place], [place] [address]    Special Requests   Final    BOTTLES DRAWN AEROBIC AND ANAEROBIC Blood Culture adequate volume Performed at [facility] [address] [place], [place] [address]    Culture   Final    NO GROWTH < 24 HOURS Performed at [facility] [address], [place], [place] [address]    Report Status PENDING  Incomplete  Urine culture     Status: Abnormal   Collection Time: 05/06/18  7:42 PM  Result Value Ref Range Status   Specimen Description   Final    URINE, CLEAN CATCH Performed at [facility], [place] [address], [place], [place] [address]    Special Requests   Final    NONE Performed at [facility], [place] [address] [place],  [address]    Culture MULTIPLE SPECIES PRESENT, SUGGEST RECOLLECTION (A)  Final   Report Status 05/08/2018 FINAL  Final     Labs: Basic Metabolic Panel: Recent Labs  Lab 05/06/18 1512 05/07/18 0355  NA 138 137  K 3.6 3.1*  CL 102 105  CO2 25 21*  GLUCOSE 89 95  BUN 47* 46*  CREATININE 0.47 0.42*  CALCIUM 11.3* 10.1  MG  --  1.4*   Liver Function Tests: Recent Labs  Lab 05/06/18 1512  AST 20  ALT 15  ALKPHOS 74  BILITOT 0.4  PROT 6.6  ALBUMIN 3.1*   No results for input(s): LIPASE, AMYLASE in the last 168 hours. No results for input(s): AMMONIA in the last 168 hours. CBC: Recent Labs  Lab 05/06/18 1512 05/07/18 0355  WBC 8.1 8.3  NEUTROABS 4.6  --   HGB 10.0* 9.2*  HCT 33.7* 31.0*  MCV 74.1* 74.5*  PLT 466* 361   Cardiac Enzymes: No results for input(s): CKTOTAL, CKMB, CKMBINDEX, TROPONINI in the last 168 hours. BNP: BNP (last 3 results) Recent Labs    08/31/17 0507 03/15/18 1137 03/16/18 0344  BNP 49.9 52.6 25.6    ProBNP (last 3 results) No results for input(s): PROBNP in the last 8760 hours.  CBG: No results for input(s): GLUCAP in the last 168  hours.     Signed:  Cristal Ford  [facility] 05/08/2018, 10:20 AM

## 2018-05-08 NOTE — Progress Notes (Signed)
Spoke to spouse who wanted the [facility] to talk to him about their services-rep Dorian Pod has [phone] to talk to spouse. Informed spouse that nurse will arrange transport for patient back home once he has arrived to home-he must call the hospital to let nurse know that they can send the patient to the house by PTAR-spouse voiced understanding.

## 2018-05-08 NOTE — Care Management Note (Signed)
Case Management Note  Patient Details  Name: Sharon Warner MRN: [id number] Date of Birth: 06/19/1951  Subjective/Objective: Patient already active w/[facility]. [facility] already following-Palliative Care Services with [facility], Per nursing 16fr f/cplaced-no leaking(unable to provide 51fr), current wt-47.6kg-rep Dorian Pod aware of d/c & HHC orders. PTAR for ambulance transp home-forms in shadow chart-Nsg to call when ready. No further CM needs.                   Action/Plan:d/c home w/HHC/PTAR.   Expected Discharge Date:  05/08/18               Expected Discharge Plan:  [place]  In-House Referral:     Discharge planning Services  CM Consult  Post Acute Care Choice:  Home Health(Active w/[facility]) Choice offered to:  Patient  DME Arranged:    DME Agency:     HH Arranged:  RN [place] Agency:     Status of Service:  Completed, signed off  If discussed at [place] of Stay Meetings, dates discussed:    Additional Comments:  Dessa Phi, RN 05/08/2018, 11:24 AM

## 2018-05-08 NOTE — Progress Notes (Signed)
Went over discharge papers with patient and husband,they all verbalized that they know which pharmacy to go for patient's medication . Discontinued wound vac,applied new dsg to sacral wound.  Pt going home with foley catheter

## 2018-05-08 NOTE — Discharge Instructions (Signed)

## 2018-05-11 ENCOUNTER — Other Ambulatory Visit: Payer: Self-pay | Admitting: Radiology

## 2018-05-11 LAB — CULTURE, BLOOD (ROUTINE X 2)
Culture: NO GROWTH
SPECIAL REQUESTS: ADEQUATE

## 2018-05-12 ENCOUNTER — Other Ambulatory Visit: Payer: Self-pay

## 2018-05-12 ENCOUNTER — Encounter (HOSPITAL_COMMUNITY): Payer: Self-pay

## 2018-05-12 ENCOUNTER — Ambulatory Visit (HOSPITAL_COMMUNITY)
Admission: RE | Admit: 2018-05-12 | Discharge: 2018-05-12 | Disposition: A | Discharge status: Home / Self Care | Payer: Medicare Other | Source: Ambulatory Visit | Attending: Family Medicine | Admitting: Family Medicine

## 2018-05-12 DIAGNOSIS — D479 Neoplasm of uncertain behavior of lymphoid, hematopoietic and related tissue, unspecified: Secondary | ICD-10-CM

## 2018-05-12 DIAGNOSIS — D72820 Lymphocytosis (symptomatic): Secondary | ICD-10-CM | POA: Insufficient documentation

## 2018-05-12 DIAGNOSIS — A419 Sepsis, unspecified organism: Secondary | ICD-10-CM | POA: Diagnosis not present

## 2018-05-12 DIAGNOSIS — R599 Enlarged lymph nodes, unspecified: Secondary | ICD-10-CM

## 2018-05-12 DIAGNOSIS — R590 Localized enlarged lymph nodes: Secondary | ICD-10-CM

## 2018-05-12 DIAGNOSIS — T83518A Infection and inflammatory reaction due to other urinary catheter, initial encounter: Secondary | ICD-10-CM | POA: Diagnosis not present

## 2018-05-12 LAB — CULTURE, BLOOD (ROUTINE X 2)
CULTURE: NO GROWTH
SPECIAL REQUESTS: ADEQUATE

## 2018-05-12 LAB — CBC
HCT: 30.8 % — ABNORMAL LOW (ref 36.0–46.0)
Hemoglobin: 8.8 g/dL — ABNORMAL LOW (ref 12.0–15.0)
MCH: 21.8 pg — ABNORMAL LOW (ref 26.0–34.0)
MCHC: 28.6 g/dL — ABNORMAL LOW (ref 30.0–36.0)
MCV: 76.2 fL — AB (ref 78.0–100.0)
PLATELETS: 306 10*3/uL (ref 150–400)
RBC: 4.04 MIL/uL (ref 3.87–5.11)
RDW: 17.1 % — AB (ref 11.5–15.5)
WBC: 7.9 10*3/uL (ref 4.0–10.5)

## 2018-05-12 LAB — PROTIME-INR
INR: 1.19
Prothrombin Time: 15 seconds (ref 11.4–15.2)

## 2018-05-12 MED ORDER — FENTANYL CITRATE (PF) 100 MCG/2ML IJ SOLN
INTRAMUSCULAR | Status: AC
  Filled 2018-05-12: qty 2

## 2018-05-12 MED ORDER — LIDOCAINE HCL (PF) 1 % IJ SOLN
INTRAMUSCULAR | Status: AC
  Filled 2018-05-12: qty 30

## 2018-05-12 MED ORDER — MIDAZOLAM HCL 2 MG/2ML IJ SOLN
INTRAMUSCULAR | Status: DC | PRN
  Administered 2018-05-12: 0.5 mg via INTRAVENOUS

## 2018-05-12 MED ORDER — MIDAZOLAM HCL 2 MG/2ML IJ SOLN
INTRAMUSCULAR | Status: AC
  Filled 2018-05-12: qty 2

## 2018-05-12 MED ORDER — SODIUM CHLORIDE 0.9 % IV SOLN: INTRAVENOUS | Status: DC
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
## 2018-05-12 NOTE — Procedures (Signed)
Pre Procedure Dx: Inguinal lymphadenopathy Post Procedural Dx: Same  Technically successful US guided biopsy of left inguinal lymph node.  EBL: None  No immediate complications.   Ronny Bacon, MD Pager #: [phone]

## 2018-05-12 NOTE — Sedation Documentation (Signed)
Patient is resting comfortably. 

## 2018-05-12 NOTE — Sedation Documentation (Signed)
Bandaid L groin intact

## 2018-05-12 NOTE — Discharge Instructions (Signed)

## 2018-05-14 ENCOUNTER — Inpatient Hospital Stay (HOSPITAL_COMMUNITY)
Admission: EM | Admit: 2018-05-14 | Discharge: 2018-05-18 | DRG: 987 | Disposition: A | Discharge status: Home Health | Payer: Medicare Other | Attending: Family Medicine | Admitting: Family Medicine

## 2018-05-14 ENCOUNTER — Emergency Department (HOSPITAL_COMMUNITY): Payer: Medicare Other

## 2018-05-14 ENCOUNTER — Other Ambulatory Visit: Payer: Self-pay

## 2018-05-14 ENCOUNTER — Encounter (HOSPITAL_COMMUNITY): Payer: Self-pay | Admitting: Emergency Medicine

## 2018-05-14 DIAGNOSIS — Z880 Allergy status to penicillin: Secondary | ICD-10-CM

## 2018-05-14 DIAGNOSIS — Z881 Allergy status to other antibiotic agents status: Secondary | ICD-10-CM

## 2018-05-14 DIAGNOSIS — Z8542 Personal history of malignant neoplasm of other parts of uterus: Secondary | ICD-10-CM | POA: Diagnosis not present

## 2018-05-14 DIAGNOSIS — L89154 Pressure ulcer of sacral region, stage 4: Secondary | ICD-10-CM | POA: Diagnosis present

## 2018-05-14 DIAGNOSIS — Z79899 Other long term (current) drug therapy: Secondary | ICD-10-CM | POA: Diagnosis not present

## 2018-05-14 DIAGNOSIS — E43 Unspecified severe protein-calorie malnutrition: Secondary | ICD-10-CM | POA: Diagnosis present

## 2018-05-14 DIAGNOSIS — G8220 Paraplegia, unspecified: Secondary | ICD-10-CM | POA: Diagnosis present

## 2018-05-14 DIAGNOSIS — T83511A Infection and inflammatory reaction due to indwelling urethral catheter, initial encounter: Secondary | ICD-10-CM

## 2018-05-14 DIAGNOSIS — Z933 Colostomy status: Secondary | ICD-10-CM | POA: Diagnosis not present

## 2018-05-14 DIAGNOSIS — A4150 Gram-negative sepsis, unspecified: Secondary | ICD-10-CM | POA: Diagnosis present

## 2018-05-14 DIAGNOSIS — A419 Sepsis, unspecified organism: Secondary | ICD-10-CM | POA: Diagnosis present

## 2018-05-14 DIAGNOSIS — N179 Acute kidney failure, unspecified: Secondary | ICD-10-CM | POA: Diagnosis present

## 2018-05-14 DIAGNOSIS — Y846 Urinary catheterization as the cause of abnormal reaction of the patient, or of later complication, without mention of misadventure at the time of the procedure: Secondary | ICD-10-CM | POA: Diagnosis present

## 2018-05-14 DIAGNOSIS — D479 Neoplasm of uncertain behavior of lymphoid, hematopoietic and related tissue, unspecified: Secondary | ICD-10-CM | POA: Diagnosis present

## 2018-05-14 DIAGNOSIS — G4733 Obstructive sleep apnea (adult) (pediatric): Secondary | ICD-10-CM | POA: Diagnosis present

## 2018-05-14 DIAGNOSIS — B964 Proteus (mirabilis) (morganii) as the cause of diseases classified elsewhere: Secondary | ICD-10-CM | POA: Diagnosis present

## 2018-05-14 DIAGNOSIS — I5022 Chronic systolic (congestive) heart failure: Secondary | ICD-10-CM | POA: Diagnosis present

## 2018-05-14 DIAGNOSIS — D638 Anemia in other chronic diseases classified elsewhere: Secondary | ICD-10-CM | POA: Diagnosis present

## 2018-05-14 DIAGNOSIS — N319 Neuromuscular dysfunction of bladder, unspecified: Secondary | ICD-10-CM | POA: Diagnosis present

## 2018-05-14 DIAGNOSIS — I959 Hypotension, unspecified: Secondary | ICD-10-CM | POA: Diagnosis present

## 2018-05-14 DIAGNOSIS — T83518A Infection and inflammatory reaction due to other urinary catheter, initial encounter: Principal | ICD-10-CM | POA: Diagnosis present

## 2018-05-14 DIAGNOSIS — I110 Hypertensive heart disease with heart failure: Secondary | ICD-10-CM | POA: Diagnosis present

## 2018-05-14 DIAGNOSIS — Z681 Body mass index (BMI) 19 or less, adult: Secondary | ICD-10-CM | POA: Diagnosis not present

## 2018-05-14 DIAGNOSIS — R6520 Severe sepsis without septic shock: Secondary | ICD-10-CM

## 2018-05-14 DIAGNOSIS — E119 Type 2 diabetes mellitus without complications: Secondary | ICD-10-CM | POA: Diagnosis present

## 2018-05-14 DIAGNOSIS — Z79891 Long term (current) use of opiate analgesic: Secondary | ICD-10-CM

## 2018-05-14 DIAGNOSIS — E861 Hypovolemia: Secondary | ICD-10-CM | POA: Diagnosis not present

## 2018-05-14 DIAGNOSIS — N390 Urinary tract infection, site not specified: Secondary | ICD-10-CM | POA: Diagnosis not present

## 2018-05-14 DIAGNOSIS — Z888 Allergy status to other drugs, medicaments and biological substances status: Secondary | ICD-10-CM

## 2018-05-14 DIAGNOSIS — I9589 Other hypotension: Secondary | ICD-10-CM | POA: Diagnosis not present

## 2018-05-14 DIAGNOSIS — Z7982 Long term (current) use of aspirin: Secondary | ICD-10-CM

## 2018-05-14 DIAGNOSIS — D72820 Lymphocytosis (symptomatic): Secondary | ICD-10-CM | POA: Diagnosis present

## 2018-05-14 DIAGNOSIS — Z87440 Personal history of urinary (tract) infections: Secondary | ICD-10-CM | POA: Diagnosis not present

## 2018-05-14 DIAGNOSIS — T83511D Infection and inflammatory reaction due to indwelling urethral catheter, subsequent encounter: Secondary | ICD-10-CM | POA: Diagnosis not present

## 2018-05-14 LAB — LACTIC ACID, PLASMA
Lactic Acid, Venous: 1.5 mmol/L (ref 0.5–1.9)
Lactic Acid, Venous: 1.2 mmol/L (ref 0.5–1.9)
Lactic Acid, Venous: 2.6 mmol/L (ref 0.5–1.9)

## 2018-05-14 LAB — COMPREHENSIVE METABOLIC PANEL
ALT: 16 U/L (ref 0–44)
ANION GAP: 10 (ref 5–15)
AST: 18 U/L (ref 15–41)
Albumin: 2.7 g/dL — ABNORMAL LOW (ref 3.5–5.0)
Alkaline Phosphatase: 81 U/L (ref 38–126)
BUN: 36 mg/dL — AB (ref 8–23)
CO2: 22 mmol/L (ref 22–32)
Calcium: 10.1 mg/dL (ref 8.9–10.3)
Chloride: 104 mmol/L (ref 98–111)
Creatinine, Ser: 0.62 mg/dL (ref 0.44–1.00)
GFR calc Af Amer: >60 mL/min (ref >60)
GFR calc non Af Amer: >60 mL/min (ref >60)
GLUCOSE: 116 mg/dL — AB (ref 70–99)
POTASSIUM: 4.2 mmol/L (ref 3.5–5.1)
SODIUM: 136 mmol/L (ref 135–145)
Total Bilirubin: 0.5 mg/dL (ref 0.3–1.2)
Total Protein: 5.6 g/dL — ABNORMAL LOW (ref 6.5–8.1)

## 2018-05-14 LAB — URINALYSIS, MICROSCOPIC (REFLEX): Squamous Epithelial / LPF: NONE SEEN (ref 0–5)

## 2018-05-14 LAB — URINALYSIS, ROUTINE W REFLEX MICROSCOPIC
Bilirubin Urine: NEGATIVE
Glucose, UA: NEGATIVE mg/dL
KETONES UR: NEGATIVE mg/dL
NITRITE: POSITIVE — AB
Protein, ur: 100 mg/dL — AB
Specific Gravity, Urine: 1.015 (ref 1.005–1.030)
pH: 7.5 (ref 5.0–8.0)

## 2018-05-14 LAB — CBC WITH DIFFERENTIAL/PLATELET
ABS IMMATURE GRANULOCYTES: 0.1 10*3/uL (ref 0.0–0.1)
Basophils Absolute: 0 10*3/uL (ref 0.0–0.1)
Basophils Relative: 0 %
Eosinophils Absolute: 0 10*3/uL (ref 0.0–0.7)
Eosinophils Relative: 0 %
HEMATOCRIT: 33.2 % — AB (ref 36.0–46.0)
HEMOGLOBIN: 9.2 g/dL — AB (ref 12.0–15.0)
IMMATURE GRANULOCYTES: 1 %
LYMPHS ABS: 3 10*3/uL (ref 0.7–4.0)
LYMPHS PCT: 28 %
MCH: 21.1 pg — ABNORMAL LOW (ref 26.0–34.0)
MCHC: 27.7 g/dL — ABNORMAL LOW (ref 30.0–36.0)
MCV: 76 fL — AB (ref 78.0–100.0)
MONOS PCT: 7 %
Monocytes Absolute: 0.7 10*3/uL (ref 0.1–1.0)
NEUTROS ABS: 7 10*3/uL (ref 1.7–7.7)
NEUTROS PCT: 64 %
Platelets: 377 10*3/uL (ref 150–400)
RBC: 4.37 MIL/uL (ref 3.87–5.11)
RDW: 17.1 % — ABNORMAL HIGH (ref 11.5–15.5)
WBC: 10.7 10*3/uL — AB (ref 4.0–10.5)

## 2018-05-14 LAB — PROTIME-INR
INR: 1.34
Prothrombin Time: 16.5 seconds — ABNORMAL HIGH (ref 11.4–15.2)

## 2018-05-14 LAB — I-STAT CG4 LACTIC ACID, ED: Lactic Acid, Venous: 2.77 mmol/L (ref 0.5–1.9)

## 2018-05-14 MED ORDER — SODIUM CHLORIDE 0.9 % IV BOLUS (SEPSIS)
500.0000 mL | Freq: Once | INTRAVENOUS | Status: AC
  Administered 2018-05-14: 500 mL via INTRAVENOUS

## 2018-05-14 MED ORDER — VITAMIN C 500 MG PO TABS
250.0000 mg | ORAL_TABLET | Freq: Every day | ORAL | Status: DC
  Administered 2018-05-15 – 2018-05-18 (×4): 250 mg via ORAL
  Filled 2018-05-14 (×4): qty 1

## 2018-05-14 MED ORDER — ADULT MULTIVITAMIN W/MINERALS CH
1.0000 | ORAL_TABLET | Freq: Every day | ORAL | Status: DC
  Administered 2018-05-15 – 2018-05-17 (×3): 1 via ORAL
  Filled 2018-05-14 (×4): qty 1

## 2018-05-14 MED ORDER — FLUTICASONE PROPIONATE 50 MCG/ACT NA SUSP
1.0000 | Freq: Every day | NASAL | Status: DC | PRN
  Filled 2018-05-14: qty 16

## 2018-05-14 MED ORDER — MENTHOL (TOPICAL ANALGESIC) 4 % EX GEL: 1.0000 "application " | CUTANEOUS | Status: DC

## 2018-05-14 MED ORDER — VITAMIN D 1000 UNITS PO TABS
1000.0000 [IU] | ORAL_TABLET | Freq: Every day | ORAL | Status: DC
  Administered 2018-05-15 – 2018-05-17 (×3): 1000 [IU] via ORAL
  Filled 2018-05-14 (×8): qty 1

## 2018-05-14 MED ORDER — SODIUM CHLORIDE 0.9 % IV BOLUS (SEPSIS)
1000.0000 mL | Freq: Once | INTRAVENOUS | Status: AC
  Administered 2018-05-14: 1000 mL via INTRAVENOUS

## 2018-05-14 MED ORDER — ACETAMINOPHEN 325 MG PO TABS
650.0000 mg | ORAL_TABLET | Freq: Once | ORAL | Status: AC
  Administered 2018-05-14: 650 mg via ORAL
  Filled 2018-05-14: qty 2

## 2018-05-14 MED ORDER — ASPIRIN EC 81 MG PO TBEC
81.0000 mg | DELAYED_RELEASE_TABLET | Freq: Every day | ORAL | Status: DC
  Administered 2018-05-15 – 2018-05-18 (×4): 81 mg via ORAL
  Filled 2018-05-14 (×4): qty 1

## 2018-05-14 MED ORDER — FERROUS SULFATE 325 (65 FE) MG PO TABS
325.0000 mg | ORAL_TABLET | Freq: Every day | ORAL | Status: DC
  Administered 2018-05-15 – 2018-05-18 (×4): 325 mg via ORAL
  Filled 2018-05-14 (×4): qty 1

## 2018-05-14 MED ORDER — ZINC SULFATE 220 (50 ZN) MG PO CAPS
220.0000 mg | ORAL_CAPSULE | Freq: Every day | ORAL | Status: DC
  Administered 2018-05-14 – 2018-05-17 (×4): 220 mg via ORAL
  Filled 2018-05-14 (×5): qty 1

## 2018-05-14 MED ORDER — PREMIER PROTEIN SHAKE
11.0000 [oz_av] | Freq: Three times a day (TID) | ORAL | Status: DC | PRN
  Filled 2018-05-14: qty 325.31

## 2018-05-14 MED ORDER — SODIUM CHLORIDE 0.9 % IV BOLUS
500.0000 mL | Freq: Once | INTRAVENOUS | Status: AC
  Administered 2018-05-14: 500 mL via INTRAVENOUS

## 2018-05-14 MED ORDER — ENOXAPARIN SODIUM 30 MG/0.3ML ~~LOC~~ SOLN
30.0000 mg | SUBCUTANEOUS | Status: DC
  Administered 2018-05-14 – 2018-05-17 (×4): 30 mg via SUBCUTANEOUS
  Filled 2018-05-14 (×4): qty 0.3

## 2018-05-14 MED ORDER — MEROPENEM 1 G IV SOLR
1.0000 g | Freq: Once | INTRAVENOUS | Status: AC
  Administered 2018-05-14: 1 g via INTRAVENOUS
  Filled 2018-05-14: qty 1

## 2018-05-14 MED ORDER — SODIUM CHLORIDE 0.9 % IV SOLN
1.0000 g | Freq: Three times a day (TID) | INTRAVENOUS | Status: DC
  Administered 2018-05-14 – 2018-05-17 (×8): 1 g via INTRAVENOUS
  Filled 2018-05-14 (×9): qty 1

## 2018-05-14 MED ORDER — PRO-STAT SUGAR FREE PO LIQD
30.0000 mL | Freq: Every day | ORAL | Status: DC
  Administered 2018-05-15 – 2018-05-18 (×4): 30 mL via ORAL
  Filled 2018-05-14 (×4): qty 30

## 2018-05-14 MED ORDER — SODIUM CHLORIDE 0.9 % IV SOLN: 1.0000 g | Freq: Three times a day (TID) | INTRAVENOUS | Status: DC

## 2018-05-14 MED ORDER — SODIUM CHLORIDE 0.9 % IV SOLN
2.0000 g | Freq: Once | INTRAVENOUS | Status: DC
  Administered 2018-05-14: 2 g via INTRAVENOUS
  Filled 2018-05-14: qty 2

## 2018-05-14 MED ORDER — HYDROCODONE-ACETAMINOPHEN 7.5-325 MG PO TABS
1.0000 | ORAL_TABLET | Freq: Every day | ORAL | Status: DC | PRN
  Administered 2018-05-16: 1 via ORAL
  Filled 2018-05-14: qty 1

## 2018-05-14 MED ORDER — NYSTATIN 100000 UNIT/GM EX POWD
1.0000 g | Freq: Three times a day (TID) | CUTANEOUS | Status: DC
  Administered 2018-05-14 – 2018-05-18 (×10): 1 g via TOPICAL
  Filled 2018-05-14: qty 15

## 2018-05-14 MED ORDER — ONDANSETRON HCL 4 MG PO TABS: 4.0000 mg | ORAL_TABLET | Freq: Four times a day (QID) | ORAL | Status: DC | PRN

## 2018-05-14 MED ORDER — SODIUM CHLORIDE 0.9 % IV SOLN
INTRAVENOUS | Status: DC
  Administered 2018-05-14 – 2018-05-18 (×4): via INTRAVENOUS
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
## 2018-05-14 NOTE — ED Triage Notes (Signed)
Pt here for temp of 100.7 with home health. Transported with EMS. HR 110-120, BP 100/60 (pt normal BP). Pt admitted last week for urosepsis. Pt AO x 4. Pt has decubitis to sacrum with new wound vac placed today by home health. Pt also has chronic foley (changed last week) and colostomy bag. Pt denies pain.
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
## 2018-05-14 NOTE — Progress Notes (Signed)
Pt transferred to room 5M11 from ED.  Pt alert and oriented.  Colostomy was detached on arrival.  Wound vac intact on sacrum.  Cleaned patient up.  Sent A11 for colostomy.

## 2018-05-14 NOTE — Progress Notes (Signed)
Pharmacy Antibiotic Note  Sharon Warner is a 67 y.o. female admitted on 05/14/2018 with UTI.  Pharmacy has been consulted for Meropenem dosing. Patient has recent history of VRE.  Plan: Meropenem 1G IV q8 hours Monitor clinical progression and culture results  Weight: 101 lb 6.6 oz (46 kg)  Temp (24hrs), Avg:99.6 F (37.6 C), Min:98.4 F (36.9 C), Max:100.7 F (38.2 C)  Recent Labs  Lab 05/12/18 1215 05/14/18 1241 05/14/18 1311  WBC 7.9 10.7*  --   LATICACIDVEN  --   --  2.77*    Estimated Creatinine Clearance: 50.9 mL/min (A) (by C-G formula based on SCr of 0.42 mg/dL (L)).    Allergies  Allergen Reactions  . Clindamycin/Lincomycin Diarrhea  . Influenza Vaccines Other (See Comments)    Patient lost feeling in her legs and that spread  . Levaquin [Levofloxacin In D5w] Nausea Only  . Penicillins Other (See Comments)    From childhood: Has patient had a PCN reaction causing immediate rash, facial/tongue/throat swelling, SOB or lightheadedness with hypotension: Unknown Has patient had a PCN reaction causing severe rash involving mucus membranes or skin necrosis: Unknown Has patient had a PCN reaction that required hospitalization: No Has patient had a PCN reaction occurring within the last 10 years: No If all of the above answers are "NO", then may proceed with Cephalosporin use.  . Simvastatin Other (See Comments)    Reaction not recalled; PATIENT STATED THAT SHE "WILL NOT TAKE"  . Statins Other (See Comments)    PATIENT STATED THAT SHE "WILL NOT TAKE" THESE    Antimicrobials this admission: Meropenem 8/15>>  Thank you for allowing pharmacy to be a part of this patient's care.  Georga Bora, PharmD Clinical Pharmacist 05/14/2018 1:42 PM Please check AMION for all [place] numbers

## 2018-05-14 NOTE — Progress Notes (Signed)
CRITICAL VALUE STICKER  CRITICAL VALUE: Lactic 2.6  RECEIVER (on-site recipient of call): Greig Right, RN  DATE & TIME NOTIFIED:  2340  MESSENGER (representative from lab):  MD NOTIFIED: Tylene Fantasia  TIME OF NOTIFICATION: 2341  RESPONSE:

## 2018-05-15 ENCOUNTER — Other Ambulatory Visit: Payer: Self-pay

## 2018-05-15 DIAGNOSIS — T83511A Infection and inflammatory reaction due to indwelling urethral catheter, initial encounter: Secondary | ICD-10-CM

## 2018-05-15 LAB — BASIC METABOLIC PANEL
Anion gap: 7 (ref 5–15)
BUN: 30 mg/dL — ABNORMAL HIGH (ref 8–23)
CO2: 19 mmol/L — ABNORMAL LOW (ref 22–32)
Calcium: 8.6 mg/dL — ABNORMAL LOW (ref 8.9–10.3)
Chloride: 111 mmol/L (ref 98–111)
Creatinine, Ser: 0.42 mg/dL — ABNORMAL LOW (ref 0.44–1.00)
GFR calc Af Amer: >60 mL/min (ref >60)
GFR calc non Af Amer: >60 mL/min (ref >60)
Glucose, Bld: 82 mg/dL (ref 70–99)
Potassium: 3.7 mmol/L (ref 3.5–5.1)
Sodium: 137 mmol/L (ref 135–145)

## 2018-05-15 LAB — CBC
HCT: 27.1 % — ABNORMAL LOW (ref 36.0–46.0)
Hemoglobin: 7.6 g/dL — ABNORMAL LOW (ref 12.0–15.0)
MCH: 21.7 pg — ABNORMAL LOW (ref 26.0–34.0)
MCHC: 28 g/dL — ABNORMAL LOW (ref 30.0–36.0)
MCV: 77.2 fL — ABNORMAL LOW (ref 78.0–100.0)
Platelets: 258 10*3/uL (ref 150–400)
RBC: 3.51 MIL/uL — ABNORMAL LOW (ref 3.87–5.11)
RDW: 17 % — ABNORMAL HIGH (ref 11.5–15.5)
WBC: 6.9 10*3/uL (ref 4.0–10.5)

## 2018-05-15 LAB — LACTIC ACID, PLASMA: Lactic Acid, Venous: 1.1 mmol/L (ref 0.5–1.9)

## 2018-05-15 MED ORDER — COLLAGENASE 250 UNIT/GM EX OINT
TOPICAL_OINTMENT | Freq: Every day | CUTANEOUS | Status: DC
  Administered 2018-05-16 – 2018-05-18 (×3): via TOPICAL
  Filled 2018-05-15: qty 30

## 2018-05-15 MED ORDER — SODIUM CHLORIDE 0.9 % IV BOLUS
1000.0000 mL | Freq: Once | INTRAVENOUS | Status: AC
  Administered 2018-05-15: 1000 mL via INTRAVENOUS

## 2018-05-15 MED ORDER — SODIUM CHLORIDE 0.9 % IV BOLUS
500.0000 mL | Freq: Once | INTRAVENOUS | Status: AC
  Administered 2018-05-15: 500 mL via INTRAVENOUS

## 2018-05-15 NOTE — Progress Notes (Signed)
PROGRESS NOTE    Sharon Warner  ERD:[id number] DOB: [birth year] DOA: 05/14/2018 PCP: [name], MD (Confirm with patient/family/NH records and if not entered, this HAS to be entered at [facility] point of entry. "No PCP" if truly none.)   Brief Narrative: (Start on day 1 of progress note - keep it brief and live) 67 year old female with history of paraplegia with indwelling Foley catheter, endometrial CA, stage IV sacral decubitus ulcer with diverting colostomy, recurrent urinary tract infections who was recently admitted to [facility] long for a urinary tract infection.  However urine cultures were negative.  Patient was discharged with Macrobid.  Patient was found to have low blood pressure by the home health care nurse, low-grade fever concerning this patient was sent to the emergency department.  In the emergency department patient was found to have lactic acid of 2.7, fever of 100.7.  Patient was given meropenem, cefepime.  Urine analysis is positive for urinary tract infection.  Assessment & Plan:   Principal Problem:   Recurrent sepsis due to urinary tract infection ([place]) Active Problems:   AKI (acute kidney injury) ([place])   Paraplegia (HCC)   Neurogenic bladder   Obstructive sleep apnea   Sacral decubitus ulcer, stage IV (HCC)   Catheter-associated urinary tract infection (HCC)   Hypotension   Chronic systolic CHF (congestive heart failure) (HCC)   Protein-calorie malnutrition, severe   Urinary tract infection   ##Recurrent urinary tract infections -Patient has indwelling Foley catheter -Urine cultures are growing more than 100,000 colonies of gram-negative rods -Continue with meropenem  ##Sepsis secondary to urinary tract infection -Follow-up with blood cultures, urine cultures -Continue the meropenem for now  ##Hypotension  -patient has chronically low blood pressure ~90s -Patient is well conscious, oriented, does not look toxic, good urine output -Closely  follow-up  ##Paraplegia -Patient states usually transfers herself with the help of the aid  ##Sacral decubitus ulcer stage IV with a diverting colostomy -Continue with wound care  ##Protein calorie malnutrition severe -Continue with high-protein diet  ##Acute renal failure -Resolved  ##Chronic systolic congestive heart failure -Looks euvolemic  ##Anemia of chronic disease -Hemoglobin trended down with the IV fluids -closely follow-up     DVT prophylaxis: Lovenox Code Status: (Full Family Communication: Patient  disposition Plan: Home with home health   Antimicrobials:  Meropenem 8/15-  Subjective: Denies having any complaints this morning.  Denies having any dizziness, nausea, vomiting  Objective: Vitals:   05/15/18 0434 05/15/18 0541 05/15/18 1026 05/15/18 1027  BP: (!) 83/52  (!) 81/57 (!) 81/57  Pulse: 91  93   Resp: 16     Temp: 98.6 F (37 C)  97.8 F (36.6 C) 97.8 F (36.6 C)  TempSrc:   Oral Oral  SpO2: 97%  92%   Weight:      Height:  5\' 1"  (1.549 m)      Intake/Output Summary (Last 24 hours) at 05/15/2018 1410 Last data filed at 05/15/2018 1300 Gross per 24 hour  Intake 1840.88 ml  Output 1600 ml  Net 240.88 ml   Filed Weights   05/14/18 1238 05/14/18 2237  Weight: 46 kg 46.1 kg    Examination:  General exam: Appears calm and comfortable  Respiratory system: Clear to auscultation. Respiratory effort normal. Cardiovascular system: S1 & S2 heard, RRR. No JVD, murmurs, rubs, gallops or clicks. No pedal edema. Gastrointestinal system: Abdomen is nondistended, soft and nontender. No organomegaly or masses felt. Normal bowel sounds heard. Central nervous system: Alert and oriented.  Paraplegia Extremities:  Paraplegia Skin: Stage IV decubitus ulcer and wound VAC. psychiatry: Judgement and insight appear normal. Mood & affect appropriate.     Data Reviewed: I have personally reviewed following labs and imaging studies  CBC: Recent Labs   Lab 05/12/18 1215 05/14/18 1241 05/15/18 0335  WBC 7.9 10.7* 6.9  NEUTROABS  --  7.0  --   HGB 8.8* 9.2* 7.6*  HCT 30.8* 33.2* 27.1*  MCV 76.2* 76.0* 77.2*  PLT 306 377 161   Basic Metabolic Panel: Recent Labs  Lab 05/14/18 1241 05/15/18 0335  NA 136 137  K 4.2 3.7  CL 104 111  CO2 22 19*  GLUCOSE 116* 82  BUN 36* 30*  CREATININE 0.62 0.42*  CALCIUM 10.1 8.6*   GFR: Estimated Creatinine Clearance: 51 mL/min (A) (by C-G formula based on SCr of 0.42 mg/dL (L)). Liver Function Tests: Recent Labs  Lab 05/14/18 1241  AST 18  ALT 16  ALKPHOS 81  BILITOT 0.5  PROT 5.6*  ALBUMIN 2.7*   No results for input(s): LIPASE, AMYLASE in the last 168 hours. No results for input(s): AMMONIA in the last 168 hours. Coagulation Profile: Recent Labs  Lab 05/12/18 1110 05/14/18 1241  INR 1.19 1.34   Cardiac Enzymes: No results for input(s): CKTOTAL, CKMB, CKMBINDEX, TROPONINI in the last 168 hours. BNP (last 3 results) No results for input(s): PROBNP in the last 8760 hours. HbA1C: No results for input(s): HGBA1C in the last 72 hours. CBG: No results for input(s): GLUCAP in the last 168 hours. Lipid Profile: No results for input(s): CHOL, HDL, LDLCALC, TRIG, CHOLHDL, LDLDIRECT in the last 72 hours. Thyroid Function Tests: No results for input(s): TSH, T4TOTAL, FREET4, T3FREE, THYROIDAB in the last 72 hours. Anemia Panel: No results for input(s): VITAMINB12, FOLATE, FERRITIN, TIBC, IRON, RETICCTPCT in the last 72 hours. Sepsis Labs: Recent Labs  Lab 05/14/18 1410 05/14/18 1752 05/14/18 2208 05/15/18 0335  LATICACIDVEN 1.5 1.2 2.6* 1.1    Recent Results (from the past 240 hour(s))  Blood Culture (routine x 2)     Status: None   Collection Time: 05/06/18  3:12 PM  Result Value Ref Range Status   Specimen Description   Final    BLOOD RIGHT ANTECUBITAL Performed at [facility] [address], [place], [place] [address]    Special Requests    Final    BOTTLES DRAWN AEROBIC AND ANAEROBIC Blood Culture adequate volume Performed at [facility] [address] [place], [place] [address]    Culture   Final    NO GROWTH 5 DAYS Performed at [facility], [place] [address], [place], [place] [address]    Report Status 05/11/2018 FINAL  Final  Urine culture     Status: Abnormal   Collection Time: 05/06/18  7:42 PM  Result Value Ref Range Status   Specimen Description   Final    URINE, CLEAN CATCH Performed at [facility], [place] [address], [place], [place] [address]    Special Requests   Final    NONE Performed at [facility], [place] [address] [place], [place] [address]    Culture MULTIPLE SPECIES PRESENT, SUGGEST RECOLLECTION (A)  Final   Report Status 05/08/2018 FINAL  Final  Blood Culture (routine x 2)     Status: None   Collection Time: 05/07/18 12:01 AM  Result Value Ref Range Status   Specimen Description   Final    BLOOD LEFT ANTECUBITAL Performed at [facility]  [place] [address], [place], [place] [address]    Special Requests   Final    BOTTLES DRAWN AEROBIC AND ANAEROBIC Blood Culture adequate volume Performed at [facility] [address], [place], [place] [address]    Culture   Final    NO GROWTH 5 DAYS Performed at [facility], [place] [address], [place], [place] [address]    Report Status 05/12/2018 FINAL  Final  Culture, blood (Routine x 2)     Status: None (Preliminary result)   Collection Time: 05/14/18 12:52 PM  Result Value Ref Range Status   Specimen Description BLOOD SITE NOT SPECIFIED  Final   Special Requests   Final    BOTTLES DRAWN AEROBIC AND ANAEROBIC Blood Culture results may not be optimal due to an inadequate volume of blood received in culture bottles   Culture   Final    NO GROWTH < 24 HOURS Performed at [facility], [place] [address] [place], [place] [address]    Report Status  PENDING  Incomplete  Culture, blood (Routine x 2)     Status: None (Preliminary result)   Collection Time: 05/14/18 12:52 PM  Result Value Ref Range Status   Specimen Description BLOOD SITE NOT SPECIFIED  Final   Special Requests   Final    BOTTLES DRAWN AEROBIC AND ANAEROBIC Blood Culture adequate volume   Culture   Final    NO GROWTH < 24 HOURS Performed at [facility], [place] [address], [place], [place] [address]    Report Status PENDING  Incomplete  Urine culture     Status: Abnormal (Preliminary result)   Collection Time: 05/14/18  2:56 PM  Result Value Ref Range Status   Specimen Description URINE, CATHETERIZED  Final   Special Requests   Final    Immunocompromised Performed at [facility], [place] [address] [place], [place] [address]    Culture >=100,000 COLONIES/mL GRAM NEGATIVE RODS (A)  Final   Report Status PENDING  Incomplete         Radiology Studies: Dg Chest Portable 1 View  Result Date: 05/14/2018 CLINICAL DATA:  Fever EXAM: PORTABLE CHEST 1 VIEW COMPARISON:  May 06, 2018 FINDINGS: No edema or consolidation. Heart size and pulmonary vascularity are normal. No adenopathy. There is thoracolumbar levoscoliosis. Calcifications in the left upper abdomen are unchanged. IMPRESSION: No edema or consolidation.  Stable cardiac silhouette. Electronically Signed   By: Lowella Grip III M.D.   On: 05/14/2018 13:46        Scheduled Meds: . aspirin EC  81 mg Oral Daily  . cholecalciferol  1,000 Units Oral Daily  . collagenase   Topical Daily  . enoxaparin (LOVENOX) injection  30 mg Subcutaneous Q24H  . feeding supplement (PRO-STAT SUGAR FREE 64)  30 mL Oral Daily  . ferrous sulfate  325 mg Oral Q breakfast  . multivitamin with minerals  1 tablet Oral Daily  . nystatin  1 g Topical TID  . vitamin C  250 mg Oral Daily  . zinc sulfate  220 mg Oral Daily   Continuous Infusions: . sodium chloride 100 mL/hr at 05/15/18 0824  . meropenem (MERREM) IV 1 g  (05/15/18 0629)     LOS: 1 day    Time spent: 38 minutes   Kristle Wesch, MD [facility] Pager [phone]  If 7PM-7AM, please contact night-coverage [email or web address] Password [email or web address] 05/15/2018, 2:10 PM

## 2018-05-15 NOTE — Progress Notes (Signed)
Patient B/P is 81/57 MD was notified. Patient B/P has being trending like this. Patient is alert and oriented will continue to monitor.

## 2018-05-15 NOTE — Consult Note (Signed)
[place] Nurse wound consult note Reason for Consult: sacral wound Chronic sacral wound, patient is followed by [place] MD For this and travels every 3 weeks by ambulance to see this MD. She has pressure injuries also on the bilateral scapula and the right ischium.  Wound type: Stage 4 Pressure injury: sacrum: 4cm x 3.5cmx 1.0cm with undermining 1.5cm circumferentially Unstageable Pressure injury: right ischium; 4cm x 2cm x 0.1cm Unstageable Pressure injuries x 2 left scapula: each aprox. 1cm x 1cm x 0.1cm  Unstageable Pressure injury: left scapula: 4cm x 1.5cm x 0.1cm   Pressure Injury POA: Yes Measurement:see above  Wound bed: Sacrum; clean, non granular, pale, chronic non healing Right ischium: 100% yellow black non viable tissue Right and left scapula wound: 100% yellow fibrinous covering Drainage (amount, consistency, odor) minimal drainage in the VAC canister from home. Minimal from the scapula and ischial wound.  Periwound: cachetic, loose skin over her entire body making VAC seal difficult, otherwise intact   Dressing procedure/placement/frequency: 1. NPWT dressing changes to the sacrum, bridge to the left hip.  1pc of black foam used for wound bed and 1pc of black foam used as a bridge to the hip. Protected the intact skin under the bridge with VAC drape.  2. Add enzymatic debridement ointment to the scapular wounds and the right ischial wound, cover with moist gauze, top with foam 3. Add low air loss mattress for moisture management and pressure redistribution 4. Prevalon boots in place for vunerable skin, low BRaden 5. Encourage nutrition for wound healing, explained this to patient as well.   [place] nurse to change NPWT dressings M/W/F while inpatient, home unit in the room with charger and new canister for DC to home     [place] Nurse ostomy consult note Stoma type/location: LLQ, end colostomy. Diversion for sacral wound healing several years ago. Stomal assessment/size: 1 3/4" round,  budded, pink, pale Peristomal assessment: intact  Treatment options for stomal/peristomal skin: none needed  Output semiformed brown stool Ostomy pouching: 1pc, patient reports she does not like lock and roll closure, prefers a clamp.  Will cut lock and roll close and place clamp.  Patient completely dependent for ostomy care. Extra supplies in the room. Patient's husband can bring clamp closure pouches from home, however she reports he is already on his way now and her pouch is leaking so I will change and if they choose to bring in other supplies over the weekend staff can change for her. [place] nurse team will follow along for support with NPWT dressings M/W/F while inpatient.    [place], [place], [place]

## 2018-05-16 DIAGNOSIS — I5022 Chronic systolic (congestive) heart failure: Secondary | ICD-10-CM

## 2018-05-16 NOTE — Progress Notes (Addendum)
PROGRESS NOTE    Sharon Warner  WUJ:[id number] DOB: Nov 26, 1950 DOA: 05/14/2018 PCP: [name], MD (Confirm with patient/family/NH records and if not entered, this HAS to be entered at [facility] point of entry. "No PCP" if truly none.)   Brief Narrative: 67 year old female with history of paraplegia with indwelling Foley catheter, endometrial CA, stage IV sacral decubitus ulcer with diverting colostomy, recurrent urinary tract infections who was recently admitted to [facility] long for a urinary tract infection.  However urine cultures were negative.  Patient was discharged with Macrobid.  Patient was found to have low blood pressure by the home health care nurse, low-grade fever concerning this patient was sent to the emergency department.  In the emergency department patient was found to have lactic acid of 2.7, fever of 100.7.  Patient was given meropenem, cefepime.  Urine analysis is positive for urinary tract infection.  Urine cultures are growing Proteus mirabilis, procidentia species.  Patient is currently on meropenem  Assessment & Plan:   Principal Problem:   Recurrent sepsis due to urinary tract infection ([place]) Active Problems:   AKI (acute kidney injury) ([place])   Paraplegia (HCC)   Neurogenic bladder   Obstructive sleep apnea   Sacral decubitus ulcer, stage IV (HCC)   Catheter-associated urinary tract infection (HCC)   Hypotension   Chronic systolic CHF (congestive heart failure) (HCC)   Protein-calorie malnutrition, severe   Urinary tract infection   ##Recurrent urinary tract infections -Patient has indwelling Foley catheter -Urine cultures are growing more than 100,000 colonies of procidentia species, Proteus mirabilis -Sensitivities are pending -Continue with meropenem  ##Sepsis secondary to urinary tract infection -Follow-up with blood cultures-the NTD, urine cultures- Providencia feces, Proteus mirabilis -Continue the meropenem for now -Improving  ##Hypotension    -patient has chronically low blood pressure ~90s -Patient is well conscious, oriented, does not look toxic, good urine output -At her baseline  ##Paraplegia -Patient states usually transfers herself with the help of the aid   ##Sacral, ischial multiple decubitus ulcer stage IV, unstageable pressure ulcers with a diverting colostomy -present on admission -Continue with wound care  ##Protein calorie malnutrition severe -Continue with high-protein diet  ##Acute renal failure -Resolved  ##Chronic systolic congestive heart failure -Looks euvolemic  ##Anemia of chronic disease -Hemoglobin trended down with the IV fluids -closely follow-up     DVT prophylaxis: Lovenox Code Status: (Full Family Communication: Patient  disposition Plan: Home with home health tomorrow with antibiotics oral VS IV based on sensitivities   Antimicrobials:  Meropenem 8/15-  Subjective: Denies having any complaints this morning.  Denies having any dizziness, nausea, vomiting  Objective: Vitals:   05/15/18 1026 05/15/18 1027 05/16/18 0606 05/16/18 1004  BP: (!) 81/57 (!) 81/57 90/62 (!) 116/102  Pulse: 93  (!) 101 77  Resp:   18 (!) 24  Temp: 97.8 F (36.6 C) 97.8 F (36.6 C) 97.8 F (36.6 C) (!) 97.4 F (36.3 C)  TempSrc: Oral Oral Oral Oral  SpO2: 92%  100% 100%  Weight:      Height:        Intake/Output Summary (Last 24 hours) at 05/16/2018 1436 Last data filed at 05/16/2018 1109 Gross per 24 hour  Intake 2299.91 ml  Output 2150 ml  Net 149.91 ml   Filed Weights   05/14/18 1238 05/14/18 2237  Weight: 46 kg 46.1 kg    Examination:  General exam: Appears calm and comfortable  Respiratory system: Clear to auscultation. Respiratory effort normal. Cardiovascular system: S1 & S2 heard, RRR.  No JVD, murmurs, rubs, gallops or clicks. No pedal edema. Gastrointestinal system: Abdomen is nondistended, soft and nontender. No organomegaly or masses felt. Normal bowel sounds  heard. Central nervous system: Alert and oriented.  Paraplegia Extremities: Paraplegia Skin: Stage IV decubitus ulcer and wound VAC. psychiatry: Judgement and insight appear normal. Mood & affect appropriate.     Data Reviewed: I have personally reviewed following labs and imaging studies  CBC: Recent Labs  Lab 05/12/18 1215 05/14/18 1241 05/15/18 0335  WBC 7.9 10.7* 6.9  NEUTROABS  --  7.0  --   HGB 8.8* 9.2* 7.6*  HCT 30.8* 33.2* 27.1*  MCV 76.2* 76.0* 77.2*  PLT 306 377 782   Basic Metabolic Panel: Recent Labs  Lab 05/14/18 1241 05/15/18 0335  NA 136 137  K 4.2 3.7  CL 104 111  CO2 22 19*  GLUCOSE 116* 82  BUN 36* 30*  CREATININE 0.62 0.42*  CALCIUM 10.1 8.6*   GFR: Estimated Creatinine Clearance: 51 mL/min (A) (by C-G formula based on SCr of 0.42 mg/dL (L)). Liver Function Tests: Recent Labs  Lab 05/14/18 1241  AST 18  ALT 16  ALKPHOS 81  BILITOT 0.5  PROT 5.6*  ALBUMIN 2.7*   No results for input(s): LIPASE, AMYLASE in the last 168 hours. No results for input(s): AMMONIA in the last 168 hours. Coagulation Profile: Recent Labs  Lab 05/12/18 1110 05/14/18 1241  INR 1.19 1.34   Cardiac Enzymes: No results for input(s): CKTOTAL, CKMB, CKMBINDEX, TROPONINI in the last 168 hours. BNP (last 3 results) No results for input(s): PROBNP in the last 8760 hours. HbA1C: No results for input(s): HGBA1C in the last 72 hours. CBG: No results for input(s): GLUCAP in the last 168 hours. Lipid Profile: No results for input(s): CHOL, HDL, LDLCALC, TRIG, CHOLHDL, LDLDIRECT in the last 72 hours. Thyroid Function Tests: No results for input(s): TSH, T4TOTAL, FREET4, T3FREE, THYROIDAB in the last 72 hours. Anemia Panel: No results for input(s): VITAMINB12, FOLATE, FERRITIN, TIBC, IRON, RETICCTPCT in the last 72 hours. Sepsis Labs: Recent Labs  Lab 05/14/18 1410 05/14/18 1752 05/14/18 2208 05/15/18 0335  LATICACIDVEN 1.5 1.2 2.6* 1.1    Recent Results  (from the past 240 hour(s))  Blood Culture (routine x 2)     Status: None   Collection Time: 05/06/18  3:12 PM  Result Value Ref Range Status   Specimen Description   Final    BLOOD RIGHT ANTECUBITAL Performed at [facility] [address] [place], [place] [address]    Special Requests   Final    BOTTLES DRAWN AEROBIC AND ANAEROBIC Blood Culture adequate volume Performed at [facility] [address] [place], [place] [address]    Culture   Final    NO GROWTH 5 DAYS Performed at [facility], [place] [address], [place], [place] [address]    Report Status 05/11/2018 FINAL  Final  Urine culture     Status: Abnormal   Collection Time: 05/06/18  7:42 PM  Result Value Ref Range Status   Specimen Description   Final    URINE, CLEAN CATCH Performed at [facility], [place] [address], [place], [place] [address]    Special Requests   Final    NONE Performed at [facility], [place] [address], [place], [place] [address]    Culture MULTIPLE SPECIES PRESENT, SUGGEST RECOLLECTION (A)  Final   Report Status 05/08/2018 FINAL  Final  Blood Culture (routine x  2)     Status: None   Collection Time: 05/07/18 12:01 AM  Result Value Ref Range Status   Specimen Description   Final    BLOOD LEFT ANTECUBITAL Performed at [facility] [address], [place], [place] [address]    Special Requests   Final    BOTTLES DRAWN AEROBIC AND ANAEROBIC Blood Culture adequate volume Performed at [facility] [address] [place], [place] [address]    Culture   Final    NO GROWTH 5 DAYS Performed at [facility], [place] [address], [place], [place] [address]    Report Status 05/12/2018 FINAL  Final  Culture, blood (Routine x 2)     Status: None (Preliminary result)   Collection Time: 05/14/18 12:52 PM  Result Value Ref Range Status   Specimen Description BLOOD SITE NOT SPECIFIED   Final   Special Requests   Final    BOTTLES DRAWN AEROBIC AND ANAEROBIC Blood Culture results may not be optimal due to an inadequate volume of blood received in culture bottles   Culture   Final    NO GROWTH 2 DAYS Performed at [facility], [place] [address] [place], [place] [address]    Report Status PENDING  Incomplete  Culture, blood (Routine x 2)     Status: None (Preliminary result)   Collection Time: 05/14/18 12:52 PM  Result Value Ref Range Status   Specimen Description BLOOD SITE NOT SPECIFIED  Final   Special Requests   Final    BOTTLES DRAWN AEROBIC AND ANAEROBIC Blood Culture adequate volume   Culture   Final    NO GROWTH 2 DAYS Performed at [facility], [address], [place], [place] [address]    Report Status PENDING  Incomplete  Urine culture     Status: Abnormal (Preliminary result)   Collection Time: 05/14/18  2:56 PM  Result Value Ref Range Status   Specimen Description URINE, CATHETERIZED  Final   Special Requests   Final    Immunocompromised Performed at [facility], [place] [address], [place], [place] [address]    Culture (A)  Final    >=100,000 COLONIES/mL PROVIDENCIA STUARTII >=100,000 COLONIES/mL PROTEUS MIRABILIS    Report Status PENDING  Incomplete         Radiology Studies: No results found.      Scheduled Meds: . aspirin EC  81 mg Oral Daily  . cholecalciferol  1,000 Units Oral Daily  . collagenase   Topical Daily  . enoxaparin (LOVENOX) injection  30 mg Subcutaneous Q24H  . feeding supplement (PRO-STAT SUGAR FREE 64)  30 mL Oral Daily  . ferrous sulfate  325 mg Oral Q breakfast  . multivitamin with minerals  1 tablet Oral Daily  . nystatin  1 g Topical TID  . vitamin C  250 mg Oral Daily  . zinc sulfate  220 mg Oral Daily   Continuous Infusions: . sodium chloride 100 mL/hr at 05/15/18 0824  . meropenem (MERREM) IV 1 g (05/16/18 1428)     LOS: 2 days    Time spent: 22 minutes   Shyler Hamill,  MD [facility] Pager [phone]  If 7PM-7AM, please contact night-coverage [email or web address] Password [facility] 05/16/2018, 2:36 PM

## 2018-05-16 NOTE — Plan of Care (Deleted)
  Problem: Fluid Volume: Goal: Hemodynamic stability will improve Outcome: Completed/Met   Problem: Clinical Measurements: Goal: Diagnostic test results will improve Outcome: Completed/Met Goal: Signs and symptoms of infection will decrease Outcome: Completed/Met   Problem: Education: Goal: Knowledge of General Education information will improve Description Including pain rating scale, medication(s)/side effects and non-pharmacologic comfort measures Outcome: Completed/Met   Problem: Health Behavior/Discharge Planning: Goal: Ability to manage health-related needs will improve Outcome: Completed/Met   Problem: Clinical Measurements: Goal: Ability to maintain clinical measurements within normal limits will improve Outcome: Completed/Met Goal: Will remain free from infection Outcome: Completed/Met Goal: Diagnostic test results will improve Outcome: Completed/Met Goal: Respiratory complications will improve Outcome: Completed/Met Goal: Cardiovascular complication will be avoided Outcome: Completed/Met   Problem: Activity: Goal: Risk for activity intolerance will decrease Outcome: Completed/Met   Problem: Nutrition: Goal: Adequate nutrition will be maintained Outcome: Completed/Met   Problem: Coping: Goal: Level of anxiety will decrease Outcome: Completed/Met   Problem: Elimination: Goal: Will not experience complications related to bowel motility Outcome: Completed/Met Goal: Will not experience complications related to urinary retention Outcome: Completed/Met   Problem: Pain Managment: Goal: General experience of comfort will improve Outcome: Completed/Met   Problem: Skin Integrity: Goal: Risk for impaired skin integrity will decrease Outcome: Completed/Met

## 2018-05-16 NOTE — Progress Notes (Signed)
Bilateral shoulder dressing changes complete.

## 2018-05-17 LAB — URINE CULTURE

## 2018-05-17 MED ORDER — CEFTRIAXONE SODIUM 1 G IJ SOLR
1.0000 g | INTRAMUSCULAR | Status: DC
  Administered 2018-05-17: 1 g via INTRAVENOUS
  Filled 2018-05-17 (×2): qty 10

## 2018-05-17 NOTE — Progress Notes (Signed)
PROGRESS NOTE    Sharon Warner  FGH:[id number] DOB: 03-Feb-1951 DOA: 05/14/2018 PCP: [name], MD    Brief Narrative: 67 year old female with history of paraplegia with indwelling Foley catheter, endometrial CA, stage IV sacral decubitus ulcer with diverting colostomy, recurrent urinary tract infections who was recently admitted to [facility] long for a urinary tract infection.  However urine cultures were negative.  Patient was discharged with Macrobid.  Patient was found to have low blood pressure by the home health care nurse, low-grade fever concerning this patient was sent to the emergency department.  In the emergency department patient was found to have lactic acid of 2.7, fever of 100.7.  Patient was given meropenem, cefepime.  Urine analysis is positive for urinary tract infection.  Urine cultures are growing Proteus mirabilis, procidentia species sensitive to rocephin.  She should be ready for discharge tomorrow home with home health.  Assessment & Plan:   Principal Problem:   Recurrent sepsis due to urinary tract infection ([place]) Active Problems:   AKI (acute kidney injury) ([place])   Paraplegia (HCC)   Neurogenic bladder   Obstructive sleep apnea   Sacral decubitus ulcer, stage IV (HCC)   Catheter-associated urinary tract infection (HCC)   Hypotension   Chronic systolic CHF (congestive heart failure) (HCC)   Protein-calorie malnutrition, severe   Urinary tract infection   Recurrent urinary tract infections : Secondary to indwelling foley catheter.  Urine cultures growing providencia sps and proteus and sensitive to rocephin.  Plan for bactrim for 7 days or more on discharge vs ID consult for the duration of the antibiotics.    Sepsis: from the above.  Resolved.  Blood cultures negative so far.    H/o paraplegia:  Chronic.    Stage 4 sacral decubitus ulcer with wound vac in place: Wound care consulted and recommendations given.    Severe protein calorie  malnutrition:  Dietary consult in am.    ARF:  Resolved.    Chronic systolic heart failure:  She appears compensated and stable. No sob or distress noted.    Anemia of chronic disease:  Hemoglobin around 7.6, drop from 9.2 , possibly hemodilution. Repeat H&H in am and transfuse as needed.  Keep hemoglobin greater than 7.    Hypotension:  bp parameters are borderline. She remains asymptomatic.     DVT prophylaxis: lovenox.  Code Status: full code.  Family Communication: discussed with husband at bedside.  Disposition Plan:home tomorrow.    Consultants:   Wound care.   Procedures:None.  Antimicrobials: rocephin.   Subjective: No chest pain or sob.   Objective: Vitals:   05/16/18 1847 05/16/18 2023 05/17/18 0530 05/17/18 0811  BP: (!) 88/66 97/67 (!) 90/50 99/68  Pulse: 100 99 (!) 102 89  Resp: (!) 26 (!) 22 20 17   Temp: 98.4 F (36.9 C) 98.5 F (36.9 C) 98.2 F (36.8 C) 98.4 F (36.9 C)  TempSrc: Oral Oral Oral Oral  SpO2: 100% 99% 98% 98%  Weight:      Height:        Intake/Output Summary (Last 24 hours) at 05/17/2018 1355 Last data filed at 05/17/2018 1149 Gross per 24 hour  Intake 1640 ml  Output 3000 ml  Net -1360 ml   Filed Weights   05/14/18 1238 05/14/18 2237  Weight: 46 kg 46.1 kg    Examination:  General exam: Appears calm and comfortable  Respiratory system: Clear to auscultation. Respiratory effort normal. Cardiovascular system s1s2, RRR.  Gastrointestinal system: Abdomen is nondistended, soft and  nontender. Colostomy in place.  Central nervous system: Alert and oriented. No focal neurological deficits. Extremities: no pedal edema.  Skin: stage 4 sacral decubitus.  Psychiatry:Mood & affect appropriate.     Data Reviewed: I have personally reviewed following labs and imaging studies  CBC: Recent Labs  Lab 05/12/18 1215 05/14/18 1241 05/15/18 0335  WBC 7.9 10.7* 6.9  NEUTROABS  --  7.0  --   HGB 8.8* 9.2* 7.6*  HCT 30.8*  33.2* 27.1*  MCV 76.2* 76.0* 77.2*  PLT 306 377 371   Basic Metabolic Panel: Recent Labs  Lab 05/14/18 1241 05/15/18 0335  NA 136 137  K 4.2 3.7  CL 104 111  CO2 22 19*  GLUCOSE 116* 82  BUN 36* 30*  CREATININE 0.62 0.42*  CALCIUM 10.1 8.6*   GFR: Estimated Creatinine Clearance: 51 mL/min (A) (by C-G formula based on SCr of 0.42 mg/dL (L)). Liver Function Tests: Recent Labs  Lab 05/14/18 1241  AST 18  ALT 16  ALKPHOS 81  BILITOT 0.5  PROT 5.6*  ALBUMIN 2.7*   No results for input(s): LIPASE, AMYLASE in the last 168 hours. No results for input(s): AMMONIA in the last 168 hours. Coagulation Profile: Recent Labs  Lab 05/12/18 1110 05/14/18 1241  INR 1.19 1.34   Cardiac Enzymes: No results for input(s): CKTOTAL, CKMB, CKMBINDEX, TROPONINI in the last 168 hours. BNP (last 3 results) No results for input(s): PROBNP in the last 8760 hours. HbA1C: No results for input(s): HGBA1C in the last 72 hours. CBG: No results for input(s): GLUCAP in the last 168 hours. Lipid Profile: No results for input(s): CHOL, HDL, LDLCALC, TRIG, CHOLHDL, LDLDIRECT in the last 72 hours. Thyroid Function Tests: No results for input(s): TSH, T4TOTAL, FREET4, T3FREE, THYROIDAB in the last 72 hours. Anemia Panel: No results for input(s): VITAMINB12, FOLATE, FERRITIN, TIBC, IRON, RETICCTPCT in the last 72 hours. Sepsis Labs: Recent Labs  Lab 05/14/18 1410 05/14/18 1752 05/14/18 2208 05/15/18 0335  LATICACIDVEN 1.5 1.2 2.6* 1.1    Recent Results (from the past 240 hour(s))  Culture, blood (Routine x 2)     Status: None (Preliminary result)   Collection Time: 05/14/18 12:52 PM  Result Value Ref Range Status   Specimen Description BLOOD SITE NOT SPECIFIED  Final   Special Requests   Final    BOTTLES DRAWN AEROBIC AND ANAEROBIC Blood Culture results may not be optimal due to an inadequate volume of blood received in culture bottles   Culture   Final    NO GROWTH 2 DAYS Performed at  [facility], [place] [address], [place], [place] [address]    Report Status PENDING  Incomplete  Culture, blood (Routine x 2)     Status: None (Preliminary result)   Collection Time: 05/14/18 12:52 PM  Result Value Ref Range Status   Specimen Description BLOOD SITE NOT SPECIFIED  Final   Special Requests   Final    BOTTLES DRAWN AEROBIC AND ANAEROBIC Blood Culture adequate volume   Culture   Final    NO GROWTH 2 DAYS Performed at [facility], [address], [place],  [address]    Report Status PENDING  Incomplete  Urine culture     Status: Abnormal   Collection Time: 05/14/18  2:56 PM  Result Value Ref Range Status   Specimen Description URINE, CATHETERIZED  Final   Special Requests   Final    Immunocompromised Performed at [facility], [place]  [address], [place], [place] [address]    Culture (A)  Final    >=100,000 COLONIES/mL PROVIDENCIA STUARTII >=100,000 COLONIES/mL PROTEUS MIRABILIS    Report Status 05/17/2018 FINAL  Final   Organism ID, Bacteria PROVIDENCIA STUARTII (A)  Final   Organism ID, Bacteria PROTEUS MIRABILIS (A)  Final      Susceptibility   Proteus mirabilis - MIC*    AMPICILLIN <=2 SENSITIVE Sensitive     CEFAZOLIN <=4 SENSITIVE Sensitive     CEFTRIAXONE <=1 SENSITIVE Sensitive     CIPROFLOXACIN 1 SENSITIVE Sensitive     GENTAMICIN <=1 SENSITIVE Sensitive     IMIPENEM 8 INTERMEDIATE Intermediate     NITROFURANTOIN 128 RESISTANT Resistant     TRIMETH/SULFA <=20 SENSITIVE Sensitive     AMPICILLIN/SULBACTAM <=2 SENSITIVE Sensitive     PIP/TAZO <=4 SENSITIVE Sensitive     * >=100,000 COLONIES/mL PROTEUS MIRABILIS   Providencia stuartii - MIC*    AMPICILLIN >=32 RESISTANT Resistant     CEFAZOLIN >=64 RESISTANT Resistant     CEFTRIAXONE <=1 SENSITIVE Sensitive     CIPROFLOXACIN >=4 RESISTANT Resistant     GENTAMICIN RESISTANT Resistant     IMIPENEM 2 SENSITIVE Sensitive     NITROFURANTOIN 128 RESISTANT Resistant     TRIMETH/SULFA  <=20 SENSITIVE Sensitive     AMPICILLIN/SULBACTAM >=32 RESISTANT Resistant     PIP/TAZO 8 SENSITIVE Sensitive     * >=100,000 COLONIES/mL PROVIDENCIA STUARTII         Radiology Studies: No results found.      Scheduled Meds: . aspirin EC  81 mg Oral Daily  . cholecalciferol  1,000 Units Oral Daily  . collagenase   Topical Daily  . enoxaparin (LOVENOX) injection  30 mg Subcutaneous Q24H  . feeding supplement (PRO-STAT SUGAR FREE 64)  30 mL Oral Daily  . ferrous sulfate  325 mg Oral Q breakfast  . multivitamin with minerals  1 tablet Oral Daily  . nystatin  1 g Topical TID  . vitamin C  250 mg Oral Daily  . zinc sulfate  220 mg Oral Daily   Continuous Infusions: . sodium chloride 100 mL/hr at 05/15/18 0824  . cefTRIAXone (ROCEPHIN)  IV       LOS: 3 days    Time spent: 35 min    Hosie Poisson, MD [facility] Pager [phone]  If 7PM-7AM, please contact night-coverage [email or web address] Password [email or web address] 05/17/2018, 1:55 PM

## 2018-05-18 DIAGNOSIS — L89154 Pressure ulcer of sacral region, stage 4: Secondary | ICD-10-CM

## 2018-05-18 DIAGNOSIS — E861 Hypovolemia: Secondary | ICD-10-CM

## 2018-05-18 DIAGNOSIS — N390 Urinary tract infection, site not specified: Secondary | ICD-10-CM

## 2018-05-18 DIAGNOSIS — N319 Neuromuscular dysfunction of bladder, unspecified: Secondary | ICD-10-CM

## 2018-05-18 DIAGNOSIS — N179 Acute kidney failure, unspecified: Secondary | ICD-10-CM

## 2018-05-18 DIAGNOSIS — A419 Sepsis, unspecified organism: Secondary | ICD-10-CM

## 2018-05-18 DIAGNOSIS — I9589 Other hypotension: Secondary | ICD-10-CM

## 2018-05-18 DIAGNOSIS — G4733 Obstructive sleep apnea (adult) (pediatric): Secondary | ICD-10-CM

## 2018-05-18 DIAGNOSIS — T83511D Infection and inflammatory reaction due to indwelling urethral catheter, subsequent encounter: Secondary | ICD-10-CM

## 2018-05-18 DIAGNOSIS — G8220 Paraplegia, unspecified: Secondary | ICD-10-CM

## 2018-05-18 DIAGNOSIS — E43 Unspecified severe protein-calorie malnutrition: Secondary | ICD-10-CM

## 2018-05-18 LAB — CBC
HCT: 27.7 % — ABNORMAL LOW (ref 36.0–46.0)
HEMOGLOBIN: 7.8 g/dL — AB (ref 12.0–15.0)
MCH: 21.6 pg — AB (ref 26.0–34.0)
MCHC: 28.2 g/dL — ABNORMAL LOW (ref 30.0–36.0)
MCV: 76.7 fL — ABNORMAL LOW (ref 78.0–100.0)
PLATELETS: 257 10*3/uL (ref 150–400)
RBC: 3.61 MIL/uL — AB (ref 3.87–5.11)
RDW: 17.2 % — ABNORMAL HIGH (ref 11.5–15.5)
WBC: 5.6 10*3/uL (ref 4.0–10.5)

## 2018-05-18 LAB — BASIC METABOLIC PANEL
ANION GAP: 8 (ref 5–15)
BUN: 15 mg/dL (ref 8–23)
CHLORIDE: 115 mmol/L — AB (ref 98–111)
CO2: 15 mmol/L — ABNORMAL LOW (ref 22–32)
Calcium: 8.5 mg/dL — ABNORMAL LOW (ref 8.9–10.3)
Creatinine, Ser: 0.33 mg/dL — ABNORMAL LOW (ref 0.44–1.00)
Glucose, Bld: 74 mg/dL (ref 70–99)
POTASSIUM: 3.5 mmol/L (ref 3.5–5.1)
SODIUM: 138 mmol/L (ref 135–145)

## 2018-05-18 MED ORDER — SULFAMETHOXAZOLE-TRIMETHOPRIM 800-160 MG PO TABS: 1.0000 | ORAL_TABLET | Freq: Two times a day (BID) | ORAL | 0 refills | Status: AC

## 2018-05-18 MED ORDER — POTASSIUM CHLORIDE ER 10 MEQ PO TBCR: 10.0000 meq | EXTENDED_RELEASE_TABLET | Freq: Every day | ORAL | Status: AC

## 2018-05-18 MED ORDER — FUROSEMIDE 20 MG PO TABS: 20.0000 mg | ORAL_TABLET | Freq: Every day | ORAL | Status: AC

## 2018-05-18 NOTE — Consult Note (Signed)
[place] Nurse wound follow up Wound type:stage 4 sacral wound followed by [facility] team.  In addition has wounds on bilateral scapula and right ischial tuberosity, managed by bedside RN.  Patient states she is being discharged today and I showed bedside RN how to switch over to home NPWT pump when appropriate.  Wound YQM:VHQIO nongranulating sacral wound Drainage (amount, consistency, odor) minimal serosanguinous Periwound:intact. Dressing is bridged to trochanter to offload pressure Dressing procedure/placement/frequency:[place] MOn/wed/Fri./  1 piece black foam to wound bed.  1 piece foam to hip for bridge, skin is protected with drape. Barrier ring to promote seal to periwound.  Seal is immediately achieved.  [place] Nurse ostomy follow up Stoma type/location: LLQ colostomy.  Pouch is leaking and nonadherent to skin this AM and is replaced Stomal assessment/size: 1 3/8 " pale pink and moist Peristomal assessment: intact  Will use barrier ring to promote seal Treatment options for stomal/peristomal skin: barrier ring and 1 piece flat pouch for flexibility Output soft brown stool Ostomy pouching: 1pc.flat with barrier ring Education provided: none Enrolled patient in [employer] Discharge program: No Woc team will follow for NPWT care through discharge.  Domenic Moras RN BSN [place] Pager [phone]

## 2018-05-18 NOTE — Discharge Summary (Signed)
Physician Discharge Summary  Elener Custodio IBB:[id number] DOB: 08-13-1951 DOA: 05/14/2018  PCP: [name], MD  Admit date: 05/14/2018 Discharge date: 05/18/2018  Admitted From: Home  Disposition: Home with [facility] Recommendations for Outpatient Follow-up:  1. Follow up with PCP in 1 weeks 2. Please obtain BMP/CBC in one week 3. Please follow up on the following pending results: final culture data  Home Health: RN, wound care  Discharge Condition: STABLE   CODE STATUS: FULL    Brief Hospitalization Summary: Please see all hospital notes, images, labs for full details of the hospitalization. HPI: Sharon Warner is a 67 y.o. female with medical history significant for paraplegia with indwelling catheter, CHF with EF 40-45%, prior endometrial cancer, stage 4 sacral decubitus with colostomy, malnutrition who was admitted last week to [facility] with sepsis from urinary source who presented again today with recurrent fever. At [facility] her urine culture showed many species. Given a h/o VRE UTI earlier this year she was treated with 2 days of meropenem and sent home to complete the course with 4 more days of macrobid. She states she had some initial improvement but this morning her home health nurse took her vitals and she had a fever and BP was lower than usual (is chronically low around 100/70). She denies lightheadedness, HA, decreased UOP. No cough, dyspnea, chest pain. She has had some trouble with her ostomy bag leaking stool despite the nurses changing the bag but she's had no loose stools. She gets her wound vac changed MWF and was recently seen in wound clinic at [facility] (Dr. Dema Severin) and was felt to be doing well. Patient did state that she had some pills left in her nitrofurantoin bottle and husband said he had been giving her her morning dose and hoping she was taking the PM dose but after discussion appears she only took it once in the morning for those 4 days prior to stopping.   ED Course: Her  normal BP is ~45/03; systolic BPs here are 88-82, MAPS > 11 Mentating fine Given sepsis protocol fluids Lactate 2.7 Urine so concentrated / dirty had to send repeat specimen Gave Meropenem, cefepime HR 85 100.7   Brief Narrative: 67 year old female with history of paraplegia with indwelling Foley catheter, endometrial CA, stage IV sacral decubitus ulcer with diverting colostomy, recurrent urinary tract infections who was recently admitted to [facility] for a urinary tract infection. However urine cultures were negative. Patient was discharged with Macrobid. Patient was found to have low blood pressure by the home health care nurse, low-grade fever concerning this patient was sent to the emergency department. In the emergency department patient was found to have lactic acid of 2.7, fever of 100.7. Patient was given meropenem, cefepime. Urine analysis is positive for urinary tract infection.Urine cultures are growing Proteus mirabilis, procidentia species sensitive to rocephin.  She should be ready for discharge tomorrow home with home health.   Assessment & Plan:   Principal Problem:   Recurrent sepsis due to urinary tract infection ([place]) Active Problems:   AKI (acute kidney injury) ([place])   Paraplegia (HCC)   Neurogenic bladder   Obstructive sleep apnea   Sacral decubitus ulcer, stage IV (HCC)   Catheter-associated urinary tract infection (HCC)   Hypotension   Chronic systolic CHF (congestive heart failure) (HCC)   Protein-calorie malnutrition, severe   Urinary tract infection  Recurrent urinary tract infections : Secondary to indwelling foley catheter.  Urine cultures growing providencia sps and proteus and sensitive to rocephin.  Plan  for bactrim DS for 7 days at discharge.   Sepsis: from the above.  Resolved.  Blood cultures negative so far.   H/o paraplegia:  Chronic.   Stage 4 sacral decubitus ulcer with wound vac in place: Wound care consulted and  recommendations given.  Wound care nurse Notes: [place] Nurse wound follow up Wound type:stage 4 sacral wound followed by [place] team.  In addition has wounds on bilateral scapula and right ischial tuberosity, managed by bedside RN.  Patient states she is being discharged today and I showed bedside RN how to switch over to home NPWT pump when appropriate.  Wound LKT:GYBWL nongranulating sacral wound Drainage (amount, consistency, odor) minimal serosanguinous Periwound:intact. Dressing is bridged to trochanter to offload pressure Dressing procedure/placement/frequency:Chagne MOn/wed/Fri./  1 piece black foam to wound bed.  1 piece foam to hip for bridge, skin is protected with drape. Barrier ring to promote seal to periwound.  Seal is immediately achieved.  [place] Nurse ostomy follow up Stoma type/location: LLQ colostomy.  Pouch is leaking and nonadherent to skin this AM and is replaced Stomal assessment/size: 1 3/8 " pale pink and moist Peristomal assessment: intact  Will use barrier ring to promote seal Treatment options for stomal/peristomal skin: barrier ring and 1 piece flat pouch for flexibility Output soft brown stool Ostomy pouching: 1pc.flat with barrier ring Education provided: none Enrolled patient in [employer] Discharge program: No Woc team will follow for NPWT care through discharge.  Domenic Moras RN BSN CWON Pager [phone]  Severe protein calorie malnutrition:   ARF:  Resolved.   Chronic systolic heart failure:  She appears compensated and stable. No sob or distress noted.   Anemia of chronic disease:  Hemoglobin around 7.8 and holding stable, recheck with CBC in 1 week.  Keep hemoglobin greater than 7.   Hypotension:  bp parameters are borderline. She remains asymptomatic.   DVT prophylaxis: lovenox.  Code Status: full code.  Family Communication: Patient/discussed with husband at bedside.  Disposition Plan:home   Consultants:   Wound care.    Procedures:None.  Antimicrobials: rocephin.  Discharge Diagnoses:  Principal Problem:   Recurrent sepsis due to urinary tract infection ([place]) Active Problems:   AKI (acute kidney injury) ([place])   Paraplegia (HCC)   Neurogenic bladder   Obstructive sleep apnea   Sacral decubitus ulcer, stage IV (HCC)   Catheter-associated urinary tract infection (HCC)   Hypotension   Chronic systolic CHF (congestive heart failure) (HCC)   Protein-calorie malnutrition, severe   Urinary tract infection  Discharge Instructions: Discharge Instructions    Call MD for:  difficulty breathing, headache or visual disturbances   Complete by:  As directed    Call MD for:  extreme fatigue   Complete by:  As directed    Call MD for:  hives   Complete by:  As directed    Call MD for:  persistant dizziness or light-headedness   Complete by:  As directed    Call MD for:  persistant nausea and vomiting   Complete by:  As directed    Call MD for:  severe uncontrolled pain   Complete by:  As directed    Increase activity slowly   Complete by:  As directed      Allergies as of 05/18/2018      Reactions   Clindamycin/lincomycin Diarrhea   Influenza Vaccines Other (See Comments)   Patient lost feeling in her legs and that spread   Levaquin [levofloxacin In D5w] Nausea Only  Penicillins Other (See Comments)   From childhood: Has patient had a PCN reaction causing immediate rash, facial/tongue/throat swelling, SOB or lightheadedness with hypotension: Unknown Has patient had a PCN reaction causing severe rash involving mucus membranes or skin necrosis: Unknown Has patient had a PCN reaction that required hospitalization: No Has patient had a PCN reaction occurring within the last 10 years: No If all of the above answers are "NO", then may proceed with Cephalosporin use.   Simvastatin Other (See Comments)   Reaction not recalled; PATIENT STATED THAT SHE "WILL NOT TAKE"   Statins Other (See Comments)    PATIENT STATED THAT SHE "WILL NOT TAKE" THESE      Medication List    STOP taking these medications   nitrofurantoin (macrocrystal-monohydrate) 100 MG capsule Commonly known as:  MACROBID     TAKE these medications   aspirin EC 81 MG tablet Take 81 mg by mouth daily.   BIOFREEZE 4 % Gel Generic drug:  Menthol (Topical Analgesic) Apply 1 application topically See admin instructions. APPLY 1-3 TIMES A DAY TO PAINFUL SITES   cholecalciferol 1000 units tablet Commonly known as:  VITAMIN D Take 1,000 Units by mouth daily.   CRANBERRY PO Take 1 capsule by mouth daily.   feeding supplement (PRO-STAT SUGAR FREE 64) Liqd Take 30 mLs by mouth daily.   ferrous sulfate 325 (65 FE) MG tablet Take 325 mg by mouth daily.   fluticasone 50 MCG/ACT nasal spray Commonly known as:  FLONASE Place 1 spray into both nostrils daily as needed for allergies.   furosemide 20 MG tablet Commonly known as:  LASIX Take 1 tablet (20 mg total) by mouth daily. Start taking on:  05/21/2018 What changed:  These instructions start on 05/21/2018. If you are unsure what to do until then, ask your doctor or other care provider.   HYDROcodone-acetaminophen 7.5-325 MG tablet Commonly known as:  NORCO Take 1 tablet by mouth daily as needed (for severe pain). What changed:  reasons to take this   multivitamin with minerals Tabs tablet Take 1 tablet by mouth daily.   nystatin powder Commonly known as:  MYCOSTATIN/NYSTOP Apply topically 3 (three) times daily. What changed:  how much to take   ondansetron 4 MG tablet Commonly known as:  ZOFRAN Take 1 tablet (4 mg total) by mouth every 6 (six) hours as needed for nausea.   potassium chloride 10 MEQ tablet Commonly known as:  K-DUR Take 1 tablet (10 mEq total) by mouth daily. Start taking on:  05/21/2018 What changed:  These instructions start on 05/21/2018. If you are unsure what to do until then, ask your doctor or other care provider.   protein  supplement shake Liqd Commonly known as:  PREMIER PROTEIN Take 11 oz by mouth 3 (three) times daily as needed (protein supplement).   sulfamethoxazole-trimethoprim 800-160 MG tablet Commonly known as:  BACTRIM DS,SEPTRA DS Take 1 tablet by mouth 2 (two) times daily for 7 days.   VISINE 0.05 % ophthalmic solution Generic drug:  tetrahydrozoline Place 1 drop into both eyes 2 (two) times daily as needed (dry eyes.).   VITAMIN C PO Take 1 tablet by mouth daily as needed.   zinc sulfate 220 (50 Zn) MG capsule Take 220 mg by mouth daily.      Follow-up Information    Koirala, Dibas, MD. Schedule an appointment as soon as possible for a visit in 1 week(s).   Specialty:  Family Medicine Why:  Hospital Follow Up  Contact  information: [address] [place] [address] [phone]          Allergies  Allergen Reactions  . Clindamycin/Lincomycin Diarrhea  . Influenza Vaccines Other (See Comments)    Patient lost feeling in her legs and that spread  . Levaquin [Levofloxacin In D5w] Nausea Only  . Penicillins Other (See Comments)    From childhood: Has patient had a PCN reaction causing immediate rash, facial/tongue/throat swelling, SOB or lightheadedness with hypotension: Unknown Has patient had a PCN reaction causing severe rash involving mucus membranes or skin necrosis: Unknown Has patient had a PCN reaction that required hospitalization: No Has patient had a PCN reaction occurring within the last 10 years: No If all of the above answers are "NO", then may proceed with Cephalosporin use.  . Simvastatin Other (See Comments)    Reaction not recalled; PATIENT STATED THAT SHE "WILL NOT TAKE"  . Statins Other (See Comments)    PATIENT STATED THAT SHE "WILL NOT TAKE" THESE   Allergies as of 05/18/2018      Reactions   Clindamycin/lincomycin Diarrhea   Influenza Vaccines Other (See Comments)   Patient lost feeling in her legs and that spread   Levaquin  [levofloxacin In D5w] Nausea Only   Penicillins Other (See Comments)   From childhood: Has patient had a PCN reaction causing immediate rash, facial/tongue/throat swelling, SOB or lightheadedness with hypotension: Unknown Has patient had a PCN reaction causing severe rash involving mucus membranes or skin necrosis: Unknown Has patient had a PCN reaction that required hospitalization: No Has patient had a PCN reaction occurring within the last 10 years: No If all of the above answers are "NO", then may proceed with Cephalosporin use.   Simvastatin Other (See Comments)   Reaction not recalled; PATIENT STATED THAT SHE "WILL NOT TAKE"   Statins Other (See Comments)   PATIENT STATED THAT SHE "WILL NOT TAKE" THESE      Medication List    STOP taking these medications   nitrofurantoin (macrocrystal-monohydrate) 100 MG capsule Commonly known as:  MACROBID     TAKE these medications   aspirin EC 81 MG tablet Take 81 mg by mouth daily.   BIOFREEZE 4 % Gel Generic drug:  Menthol (Topical Analgesic) Apply 1 application topically See admin instructions. APPLY 1-3 TIMES A DAY TO PAINFUL SITES   cholecalciferol 1000 units tablet Commonly known as:  VITAMIN D Take 1,000 Units by mouth daily.   CRANBERRY PO Take 1 capsule by mouth daily.   feeding supplement (PRO-STAT SUGAR FREE 64) Liqd Take 30 mLs by mouth daily.   ferrous sulfate 325 (65 FE) MG tablet Take 325 mg by mouth daily.   fluticasone 50 MCG/ACT nasal spray Commonly known as:  FLONASE Place 1 spray into both nostrils daily as needed for allergies.   furosemide 20 MG tablet Commonly known as:  LASIX Take 1 tablet (20 mg total) by mouth daily. Start taking on:  05/21/2018 What changed:  These instructions start on 05/21/2018. If you are unsure what to do until then, ask your doctor or other care provider.   HYDROcodone-acetaminophen 7.5-325 MG tablet Commonly known as:  NORCO Take 1 tablet by mouth daily as needed (for  severe pain). What changed:  reasons to take this   multivitamin with minerals Tabs tablet Take 1 tablet by mouth daily.   nystatin powder Commonly known as:  MYCOSTATIN/NYSTOP Apply topically 3 (three) times daily. What changed:  how much to take   ondansetron  4 MG tablet Commonly known as:  ZOFRAN Take 1 tablet (4 mg total) by mouth every 6 (six) hours as needed for nausea.   potassium chloride 10 MEQ tablet Commonly known as:  K-DUR Take 1 tablet (10 mEq total) by mouth daily. Start taking on:  05/21/2018 What changed:  These instructions start on 05/21/2018. If you are unsure what to do until then, ask your doctor or other care provider.   protein supplement shake Liqd Commonly known as:  PREMIER PROTEIN Take 11 oz by mouth 3 (three) times daily as needed (protein supplement).   sulfamethoxazole-trimethoprim 800-160 MG tablet Commonly known as:  BACTRIM DS,SEPTRA DS Take 1 tablet by mouth 2 (two) times daily for 7 days.   VISINE 0.05 % ophthalmic solution Generic drug:  tetrahydrozoline Place 1 drop into both eyes 2 (two) times daily as needed (dry eyes.).   VITAMIN C PO Take 1 tablet by mouth daily as needed.   zinc sulfate 220 (50 Zn) MG capsule Take 220 mg by mouth daily.       Procedures/Studies: Dg Chest 2 View  Result Date: 05/06/2018 CLINICAL DATA:  Weakness, hypotensive. EXAM: CHEST - 2 VIEW COMPARISON:  Chest radiograph March 14, 2018 FINDINGS: Cardiomediastinal silhouette is normal. No pleural effusions or focal consolidations. Punctate LEFT upper lobe granuloma. Trachea projects midline and there is no pneumothorax. Soft tissue planes and included osseous structures are non-suspicious. Broad dextroscoliosis. Osteopenia. Coarse calcification LEFT upper quadrant, probable nephrolithiasis. IMPRESSION: No active cardiopulmonary process. Electronically Signed   By: Elon Alas M.D.   On: 05/06/2018 17:18   Dg Chest Portable 1 View  Result Date:  05/14/2018 CLINICAL DATA:  Fever EXAM: PORTABLE CHEST 1 VIEW COMPARISON:  May 06, 2018 FINDINGS: No edema or consolidation. Heart size and pulmonary vascularity are normal. No adenopathy. There is thoracolumbar levoscoliosis. Calcifications in the left upper abdomen are unchanged. IMPRESSION: No edema or consolidation.  Stable cardiac silhouette. Electronically Signed   By: Lowella Grip III M.D.   On: 05/14/2018 13:46   [place] Core Biopsy (lymph Nodes)  Result Date: 05/12/2018 INDICATION: No known primary, now with retroperitoneal and inguinal lymphadenopathy worrisome for lymphoma. Please perform ultrasound-guided left inguinal lymph node biopsy for tissue diagnostic purposes. EXAM: ULTRASOUND-GUIDED LEFT INGUINAL LYMPH NODE BIOPSY COMPARISON:  CT of the abdomen and pelvis-03/12/2018 MEDICATIONS: None ANESTHESIA/SEDATION: Moderate (conscious) sedation was employed during this procedure. A total of Versed 0.5 mg was administered intravenously. Moderate Sedation Time: 10 minutes. The patient's level of consciousness and vital signs were monitored continuously by radiology nursing throughout the procedure under my direct supervision. COMPLICATIONS: None immediate. TECHNIQUE: Informed written consent was obtained from the patient after a discussion of the risks, benefits and alternatives to treatment. Questions regarding the procedure were encouraged and answered. Initial ultrasound scanning demonstrated bulky left inguinal lymphadenopathy with index nodal conglomeration measuring at least 5.0 x 3.2 cm (image 7). An ultrasound image was saved for documentation purposes. The procedure was planned. A timeout was performed prior to the initiation of the procedure. The operative was prepped and draped in the usual sterile fashion, and a sterile drape was applied covering the operative field. A timeout was performed prior to the initiation of the procedure. Local anesthesia was provided with 1% lidocaine with  epinephrine. Under direct ultrasound guidance, an 18 gauge core needle device was utilized to obtain to obtain 6 core needle biopsies of the dominant left inguinal nodal conglomeration. The samples were placed in saline and submitted to pathology. The needle  was removed and hemostasis was achieved with manual compression. Post procedure scan was negative for significant hematoma. A dressing was placed. The patient tolerated the procedure well without immediate postprocedural complication. IMPRESSION: Technically successful ultrasound guided biopsy of dominant left inguinal nodal conglomeration. Electronically Signed   By: Sandi Mariscal M.D.   On: 05/12/2018 14:45      Subjective: Pt without complaints, says she feels much better.    Discharge Exam: Vitals:   05/18/18 0514 05/18/18 0740  BP: 92/65 91/61  Pulse: 84 88  Resp: 18   Temp: 98.7 F (37.1 C) 98.4 F (36.9 C)  SpO2: 100% 98%   Vitals:   05/17/18 1859 05/17/18 2022 05/18/18 0514 05/18/18 0740  BP: 110/74 104/69 92/65 91/61   Pulse: (!) 101 (!) 108 84 88  Resp: 19 18 18    Temp: 99.8 F (37.7 C) 99.9 F (37.7 C) 98.7 F (37.1 C) 98.4 F (36.9 C)  TempSrc: Axillary Oral Oral Oral  SpO2: 100% 100% 100% 98%  Weight:      Height:       General exam: Appears calm and comfortable  Respiratory system: Clear to auscultation. Respiratory effort normal. Cardiovascular system s1s2, RRR.  Gastrointestinal system: Abdomen is nondistended, soft and nontender. Colostomy in place.  Central nervous system: Alert and oriented. No focal neurological deficits. Extremities: no pedal edema.  Skin: stage 4 sacral decubitus.  Psychiatry:Mood & affect appropriate.    The results of significant diagnostics from this hospitalization (including imaging, microbiology, ancillary and laboratory) are listed below for reference.     Microbiology: Recent Results (from the past 240 hour(s))  Culture, blood (Routine x 2)     Status: None (Preliminary  result)   Collection Time: 05/14/18 12:52 PM  Result Value Ref Range Status   Specimen Description BLOOD SITE NOT SPECIFIED  Final   Special Requests   Final    BOTTLES DRAWN AEROBIC AND ANAEROBIC Blood Culture results may not be optimal due to an inadequate volume of blood received in culture bottles   Culture   Final    NO GROWTH 3 DAYS Performed at [facility], [place] [address], [place], [place] [address]    Report Status PENDING  Incomplete  Culture, blood (Routine x 2)     Status: None (Preliminary result)   Collection Time: 05/14/18 12:52 PM  Result Value Ref Range Status   Specimen Description BLOOD SITE NOT SPECIFIED  Final   Special Requests   Final    BOTTLES DRAWN AEROBIC AND ANAEROBIC Blood Culture adequate volume   Culture   Final    NO GROWTH 3 DAYS Performed at [facility], [address] [place], [place] [address]    Report Status PENDING  Incomplete  Urine culture     Status: Abnormal   Collection Time: 05/14/18  2:56 PM  Result Value Ref Range Status   Specimen Description URINE, CATHETERIZED  Final   Special Requests   Final    Immunocompromised Performed at [facility], [place] [address], [place], [place] [address]    Culture (A)  Final    >=100,000 COLONIES/mL PROVIDENCIA STUARTII >=100,000 COLONIES/mL PROTEUS MIRABILIS    Report Status 05/17/2018 FINAL  Final   Organism ID, Bacteria PROVIDENCIA STUARTII (A)  Final   Organism ID, Bacteria PROTEUS MIRABILIS (A)  Final      Susceptibility   Proteus mirabilis - MIC*    AMPICILLIN <=2 SENSITIVE Sensitive     CEFAZOLIN <=4 SENSITIVE  Sensitive     CEFTRIAXONE <=1 SENSITIVE Sensitive     CIPROFLOXACIN 1 SENSITIVE Sensitive     GENTAMICIN <=1 SENSITIVE Sensitive     IMIPENEM 8 INTERMEDIATE Intermediate     NITROFURANTOIN 128 RESISTANT Resistant     TRIMETH/SULFA <=20 SENSITIVE Sensitive     AMPICILLIN/SULBACTAM <=2 SENSITIVE Sensitive     PIP/TAZO <=4 SENSITIVE Sensitive     * >=100,000  COLONIES/mL PROTEUS MIRABILIS   Providencia stuartii - MIC*    AMPICILLIN >=32 RESISTANT Resistant     CEFAZOLIN >=64 RESISTANT Resistant     CEFTRIAXONE <=1 SENSITIVE Sensitive     CIPROFLOXACIN >=4 RESISTANT Resistant     GENTAMICIN RESISTANT Resistant     IMIPENEM 2 SENSITIVE Sensitive     NITROFURANTOIN 128 RESISTANT Resistant     TRIMETH/SULFA <=20 SENSITIVE Sensitive     AMPICILLIN/SULBACTAM >=32 RESISTANT Resistant     PIP/TAZO 8 SENSITIVE Sensitive     * >=100,000 COLONIES/mL PROVIDENCIA STUARTII     Labs: BNP (last 3 results) Recent Labs    08/31/17 0507 03/15/18 1137 03/16/18 0344  BNP 49.9 52.6 14.2   Basic Metabolic Panel: Recent Labs  Lab 05/14/18 1241 05/15/18 0335 05/18/18 0412  NA 136 137 138  K 4.2 3.7 3.5  CL 104 111 115*  CO2 22 19* 15*  GLUCOSE 116* 82 74  BUN 36* 30* 15  CREATININE 0.62 0.42* 0.33*  CALCIUM 10.1 8.6* 8.5*   Liver Function Tests: Recent Labs  Lab 05/14/18 1241  AST 18  ALT 16  ALKPHOS 81  BILITOT 0.5  PROT 5.6*  ALBUMIN 2.7*   No results for input(s): LIPASE, AMYLASE in the last 168 hours. No results for input(s): AMMONIA in the last 168 hours. CBC: Recent Labs  Lab 05/12/18 1215 05/14/18 1241 05/15/18 0335 05/18/18 0412  WBC 7.9 10.7* 6.9 5.6  NEUTROABS  --  7.0  --   --   HGB 8.8* 9.2* 7.6* 7.8*  HCT 30.8* 33.2* 27.1* 27.7*  MCV 76.2* 76.0* 77.2* 76.7*  PLT 306 377 258 257   Cardiac Enzymes: No results for input(s): CKTOTAL, CKMB, CKMBINDEX, TROPONINI in the last 168 hours. BNP: Invalid input(s): POCBNP CBG: No results for input(s): GLUCAP in the last 168 hours. D-Dimer No results for input(s): DDIMER in the last 72 hours. Hgb A1c No results for input(s): HGBA1C in the last 72 hours. Lipid Profile No results for input(s): CHOL, HDL, LDLCALC, TRIG, CHOLHDL, LDLDIRECT in the last 72 hours. Thyroid function studies No results for input(s): TSH, T4TOTAL, T3FREE, THYROIDAB in the last 72 hours.  Invalid  input(s): FREET3 Anemia work up No results for input(s): VITAMINB12, FOLATE, FERRITIN, TIBC, IRON, RETICCTPCT in the last 72 hours. Urinalysis    Component Value Date/Time   COLORURINE YELLOW 05/14/2018 1556   APPEARANCEUR CLOUDY (A) 05/14/2018 1556   LABSPEC 1.015 05/14/2018 1556   PHURINE 7.5 05/14/2018 1556   GLUCOSEU NEGATIVE 05/14/2018 1556   HGBUR SMALL (A) 05/14/2018 1556   BILIRUBINUR NEGATIVE 05/14/2018 1556   KETONESUR NEGATIVE 05/14/2018 1556   PROTEINUR 100 (A) 05/14/2018 1556   NITRITE POSITIVE (A) 05/14/2018 1556   LEUKOCYTESUR MODERATE (A) 05/14/2018 1556   Sepsis Labs Invalid input(s): PROCALCITONIN,  WBC,  LACTICIDVEN Microbiology Recent Results (from the past 240 hour(s))  Culture, blood (Routine x 2)     Status: None (Preliminary result)   Collection Time: 05/14/18 12:52 PM  Result Value Ref Range Status   Specimen Description BLOOD SITE NOT SPECIFIED  Final  Special Requests   Final    BOTTLES DRAWN AEROBIC AND ANAEROBIC Blood Culture results may not be optimal due to an inadequate volume of blood received in culture bottles   Culture   Final    NO GROWTH 3 DAYS Performed at [facility], [place] [address] [place], [place] [address]    Report Status PENDING  Incomplete  Culture, blood (Routine x 2)     Status: None (Preliminary result)   Collection Time: 05/14/18 12:52 PM  Result Value Ref Range Status   Specimen Description BLOOD SITE NOT SPECIFIED  Final   Special Requests   Final    BOTTLES DRAWN AEROBIC AND ANAEROBIC Blood Culture adequate volume   Culture   Final    NO GROWTH 3 DAYS Performed at [facility], [address], [place], [place] [address]    Report Status PENDING  Incomplete  Urine culture     Status: Abnormal   Collection Time: 05/14/18  2:56 PM  Result Value Ref Range Status   Specimen Description URINE, CATHETERIZED  Final   Special Requests   Final    Immunocompromised Performed at [facility], [place]  [address], [place], [place] [address]    Culture (A)  Final    >=100,000 COLONIES/mL PROVIDENCIA STUARTII >=100,000 COLONIES/mL PROTEUS MIRABILIS    Report Status 05/17/2018 FINAL  Final   Organism ID, Bacteria PROVIDENCIA STUARTII (A)  Final   Organism ID, Bacteria PROTEUS MIRABILIS (A)  Final      Susceptibility   Proteus mirabilis - MIC*    AMPICILLIN <=2 SENSITIVE Sensitive     CEFAZOLIN <=4 SENSITIVE Sensitive     CEFTRIAXONE <=1 SENSITIVE Sensitive     CIPROFLOXACIN 1 SENSITIVE Sensitive     GENTAMICIN <=1 SENSITIVE Sensitive     IMIPENEM 8 INTERMEDIATE Intermediate     NITROFURANTOIN 128 RESISTANT Resistant     TRIMETH/SULFA <=20 SENSITIVE Sensitive     AMPICILLIN/SULBACTAM <=2 SENSITIVE Sensitive     PIP/TAZO <=4 SENSITIVE Sensitive     * >=100,000 COLONIES/mL PROTEUS MIRABILIS   Providencia stuartii - MIC*    AMPICILLIN >=32 RESISTANT Resistant     CEFAZOLIN >=64 RESISTANT Resistant     CEFTRIAXONE <=1 SENSITIVE Sensitive     CIPROFLOXACIN >=4 RESISTANT Resistant     GENTAMICIN RESISTANT Resistant     IMIPENEM 2 SENSITIVE Sensitive     NITROFURANTOIN 128 RESISTANT Resistant     TRIMETH/SULFA <=20 SENSITIVE Sensitive     AMPICILLIN/SULBACTAM >=32 RESISTANT Resistant     PIP/TAZO 8 SENSITIVE Sensitive     * >=100,000 COLONIES/mL PROVIDENCIA STUARTII   Time coordinating discharge: 33 minutes  SIGNED:  Irwin Brakeman, MD  [facility] 05/18/2018, 12:16 PM Pager [phone]  If 7PM-7AM, please contact night-coverage [email or web address] Password [email or web address]

## 2018-05-18 NOTE — Care Management Note (Signed)
Case Management Note  Patient Details  Name: Sharon Warner MRN: [id number] Date of Birth: 10-14-1950  Subjective/Objective:  Recurrent sepsis due to urinary tract infection.      Action/Plan: Case manager spoke with patient concerning discharge plan. She is active with [facility] and will resume with their services, CM spoke with Dorian Pod, [facility] Liaison to confirm services will  Resume. Patient has family support at discharge. Will transport home via [place].     Expected Discharge Date:  05/18/18               Expected Discharge Plan:  [place]  In-House Referral:  NA  Discharge planning Services  CM Consult  Post Acute Care Choice:  Home Health Choice offered to:  Patient  DME Arranged:    DME Agency:     HH Arranged:  RN [place] Agency:  [facility]  Status of Service:  Completed, signed off  If discussed at [place] of Stay Meetings, dates discussed:    Additional Comments:  Ninfa Meeker, RN 05/18/2018, 2:32 PM

## 2018-05-18 NOTE — Progress Notes (Signed)
Sharon Warner to be D/C'd Home per MD order.  Discussed prescriptions and follow up appointments with the patient. Prescriptions given to patient, medication list explained in detail. Pt verbalized understanding.  Allergies as of 05/18/2018      Reactions   Clindamycin/lincomycin Diarrhea   Influenza Vaccines Other (See Comments)   Patient lost feeling in her legs and that spread   Levaquin [levofloxacin In D5w] Nausea Only   Penicillins Other (See Comments)   From childhood: Has patient had a PCN reaction causing immediate rash, facial/tongue/throat swelling, SOB or lightheadedness with hypotension: Unknown Has patient had a PCN reaction causing severe rash involving mucus membranes or skin necrosis: Unknown Has patient had a PCN reaction that required hospitalization: No Has patient had a PCN reaction occurring within the last 10 years: No If all of the above answers are "NO", then may proceed with Cephalosporin use.   Simvastatin Other (See Comments)   Reaction not recalled; PATIENT STATED THAT SHE "WILL NOT TAKE"   Statins Other (See Comments)   PATIENT STATED THAT SHE "WILL NOT TAKE" THESE      Medication List    STOP taking these medications   nitrofurantoin (macrocrystal-monohydrate) 100 MG capsule Commonly known as:  MACROBID     TAKE these medications   aspirin EC 81 MG tablet Take 81 mg by mouth daily.   BIOFREEZE 4 % Gel Generic drug:  Menthol (Topical Analgesic) Apply 1 application topically See admin instructions. APPLY 1-3 TIMES A DAY TO PAINFUL SITES   cholecalciferol 1000 units tablet Commonly known as:  VITAMIN D Take 1,000 Units by mouth daily.   CRANBERRY PO Take 1 capsule by mouth daily.   feeding supplement (PRO-STAT SUGAR FREE 64) Liqd Take 30 mLs by mouth daily.   ferrous sulfate 325 (65 FE) MG tablet Take 325 mg by mouth daily.   fluticasone 50 MCG/ACT nasal spray Commonly known as:  FLONASE Place 1 spray into both nostrils daily as needed for  allergies.   furosemide 20 MG tablet Commonly known as:  LASIX Take 1 tablet (20 mg total) by mouth daily. Start taking on:  05/21/2018 What changed:  These instructions start on 05/21/2018. If you are unsure what to do until then, ask your doctor or other care provider.   HYDROcodone-acetaminophen 7.5-325 MG tablet Commonly known as:  NORCO Take 1 tablet by mouth daily as needed (for severe pain). What changed:  reasons to take this   multivitamin with minerals Tabs tablet Take 1 tablet by mouth daily.   nystatin powder Commonly known as:  MYCOSTATIN/NYSTOP Apply topically 3 (three) times daily. What changed:  how much to take   ondansetron 4 MG tablet Commonly known as:  ZOFRAN Take 1 tablet (4 mg total) by mouth every 6 (six) hours as needed for nausea.   potassium chloride 10 MEQ tablet Commonly known as:  K-DUR Take 1 tablet (10 mEq total) by mouth daily. Start taking on:  05/21/2018 What changed:  These instructions start on 05/21/2018. If you are unsure what to do until then, ask your doctor or other care provider.   protein supplement shake Liqd Commonly known as:  PREMIER PROTEIN Take 11 oz by mouth 3 (three) times daily as needed (protein supplement).   sulfamethoxazole-trimethoprim 800-160 MG tablet Commonly known as:  BACTRIM DS,SEPTRA DS Take 1 tablet by mouth 2 (two) times daily for 7 days.   VISINE 0.05 % ophthalmic solution Generic drug:  tetrahydrozoline Place 1 drop into both eyes 2 (two)  times daily as needed (dry eyes.).   VITAMIN C PO Take 1 tablet by mouth daily as needed.   zinc sulfate 220 (50 Zn) MG capsule Take 220 mg by mouth daily.       Vitals:   05/18/18 0514 05/18/18 0740  BP: 92/65 91/61  Pulse: 84 88  Resp: 18   Temp: 98.7 F (37.1 C) 98.4 F (36.9 C)  SpO2: 100% 98%    Skin clean, dry and intact without evidence of skin break down, no evidence of skin tears noted. IV catheter discontinued intact. Site without signs and  symptoms of complications. Dressing and pressure applied. Pt denies pain at this time. No complaints noted.  An After Visit Summary was printed and given to the patient. Patient escorted via stretcher, and D/C home via [facility].  Aneta Mins BSN, RN

## 2018-05-18 NOTE — Discharge Instructions (Signed)
Follow with Primary MD  [place], Dibas, MD  and other consultant's as instructed your Hospitalist MD  [place].  [place] Nurse wound follow up Wound type:stage 4 sacral wound followed by [place] team.  In addition has wounds on bilateral scapula and right ischial tuberosity, managed by bedside RN.  Patient states she is being discharged today and I showed bedside RN how to switch over to home NPWT pump when appropriate.  Wound GHW:EXHBZ nongranulating sacral wound Drainage (amount, consistency, odor) minimal serosanguinous Periwound:intact. Dressing is bridged to trochanter to offload pressure Dressing procedure/placement/frequency:Chagne MOn/wed/Fri./  1 piece black foam to wound bed.  1 piece foam to hip for bridge, skin is protected with drape. Barrier ring to promote seal to periwound.  Seal is immediately achieved.  [place] Nurse ostomy follow up Stoma type/location: LLQ colostomy.  Pouch is leaking and nonadherent to skin this AM and is replaced Stomal assessment/size: 1 3/8 " pale pink and moist Peristomal assessment: intact  Will use barrier ring to promote seal Treatment options for stomal/peristomal skin: barrier ring and 1 piece flat pouch for flexibility Output soft brown stool Ostomy pouching: 1pc.flat with barrier ring Education provided: none Enrolled patient in [employer] Discharge program: No Woc team will follow for NPWT care through discharge.  Domenic Moras RN BSN [facility] Pager [phone]  Please get a complete blood count and chemistry panel checked by your Primary MD at your next visit, and again as instructed by your Primary MD.  Get Medicines reviewed and adjusted: Please take all your medications with you for your next visit with your Primary MD  Laboratory/radiological data: Please request your Primary MD to go over all hospital tests and procedure/radiological results at the follow up, please ask your Primary MD to get all Hospital records  sent to his/her office.  In some cases, they will be blood work, cultures and biopsy results pending at the time of your discharge. Please request that your primary care M.D. follows up on these results.  Also Note the following: If you experience worsening of your admission symptoms, develop shortness of breath, life threatening emergency, suicidal or homicidal thoughts you must seek medical attention immediately by calling 911 or calling your MD immediately  if symptoms less severe.  You must read complete instructions/literature along with all the possible adverse reactions/side effects for all the Medicines you take and that have been prescribed to you. Take any new Medicines after you have completely understood and accpet all the possible adverse reactions/side effects.   Do not drive when taking Pain medications or sleeping medications (Benzodaizepines)  Do not take more than prescribed Pain, Sleep and Anxiety Medications. It is not advisable to combine anxiety,sleep and pain medications without talking with your primary care practitioner  Special Instructions: If you have smoked or chewed Tobacco  in the last 2 yrs please stop smoking, stop any regular Alcohol  and or any Recreational drug use.  Wear Seat belts while driving.  Please note: You were cared for by a hospitalist during your hospital stay. Once you are discharged, your primary care physician will handle any further medical issues. Please note that NO REFILLS for any discharge medications will be authorized once you are discharged, as it is imperative that you return to your primary care physician (or establish a relationship with a primary care physician if you do not have one) for your post hospital discharge needs so that they can reassess your need for medications and  monitor your lab values.      Antibiotic Medicine, Adult Antibiotic medicines treat infections caused by a type of germ called bacteria. They work by  killing the bacteria that make you sick. When do I need to take antibiotics? You often need these medicines to treat bacterial infections, such as:  A urinary tract infection (UTI).  Strep throat.  Meningitis. This affects the spinal cord and brain.  A bad lung infection.  You may start the medicines while your doctor waits for tests to come back. When the tests come back, your doctor may change or stop your medicine. When are antibiotics not needed? You do not need these medicines for most common illnesses, such as:  A cold.  The flu.  A sore throat.  Antibiotics are not always needed for all infections caused by bacteria. Do not ask for these medicines, or take them, when they are not needed. What are the risks of taking antibiotics? Most antibiotics can cause an infection called Clostridium difficile.This causes watery poop (diarrhea). Let your doctor know right away if:  You have watery poop while taking an antibiotic.  You have watery poop after you stop taking an antibiotic. The illness can happen weeks after you stop the medicine.  You also have a risk of getting an infection in the future that antibiotics cannot treat (antibiotic-resistant infection). This type of infection can be dangerous. What else should I know about taking antibiotics?  You need to take the entire prescription. ? Take the medicine for as long as told by your doctor. ? Do not stop taking it even if you start to feel better.  Try not to miss any doses. If you miss a dose, call your doctor.  Birth control pills may not work. If you take birth control pills: ? Keep on taking them. ? Use a second form of birth control, such as a condom. Do this for as long as told by your doctor.  Ask your doctor: ? How long to wait in between doses. ? If you should take the medicine with food. ? If there is anything you should stay away from while taking the antibiotic, such  as: ? Food. ? Drinks. ? Medicines. ? If there are any side effects you should watch for.  Only take the medicines that your doctor told you to take. Do not take medicines that were given to someone else.  Drink a large glass of water with the medicine.  Ask the pharmacist for a tool to measure the medicine, such as: ? A syringe. ? A cup. ? A spoon.  Throw away any extra medicine. Contact a doctor if:  You get worse.  You have new joint pain or muscle aches after starting the medicine.  You have side effects from the medicine, such as: ? Stomach pain. ? Watery poop. ? Feeling sick to your stomach (nausea). Get help right away if:  You have signs of a very bad allergic reaction. If this happens, stop taking the medicine right away. Signs may include: ? Hives. These are raised, itchy, red bumps on the skin. ? Skin rash. ? Trouble breathing. ? Wheezing. ? Swelling. ? Feeling dizzy. ? Throwing up (vomiting).  Your pee (urine) is dark, or is the color of blood.  Your skin turns yellow.  You bruise easily.  You bleed easily.  You have very bad watery poop and cramps in your belly.  You have a very bad headache. Summary  Antibiotics are often used  to treat infections caused by bacteria.  Only take these medicines when needed.  Let your doctor know if you have watery poop while taking an antibiotic.  You need to take the entire prescription. This information is not intended to replace advice given to you by your health care provider. Make sure you discuss any questions you have with your health care provider. Document Released: 06/25/2008 Document Revised: 09/18/2016 Document Reviewed: 09/18/2016 Elsevier Interactive Patient Education  2017 [employer].

## 2018-05-18 NOTE — Care Management Important Message (Signed)
Important Message  Patient Details  Name: Sharon Warner MRN: [id number] Date of Birth: 05-02-1951   Medicare Important Message Given:  Yes    Calisha Tindel [address] 05/18/2018, 4:09 PM

## 2018-05-19 ENCOUNTER — Inpatient Hospital Stay: Payer: Medicare Other | Admitting: Hematology

## 2018-05-19 LAB — CULTURE, BLOOD (ROUTINE X 2)
Culture: NO GROWTH
Culture: NO GROWTH
Special Requests: ADEQUATE

## 2018-05-21 ENCOUNTER — Encounter: Payer: Self-pay | Admitting: Hematology

## 2018-05-21 ENCOUNTER — Encounter: Payer: Self-pay | Admitting: Neurology

## 2018-05-21 ENCOUNTER — Telehealth: Payer: Self-pay | Admitting: Hematology

## 2018-05-21 ENCOUNTER — Ambulatory Visit (INDEPENDENT_AMBULATORY_CARE_PROVIDER_SITE_OTHER): Payer: Medicare Other | Admitting: Neurology

## 2018-05-21 VITALS — BP 109/68 | HR 100

## 2018-05-21 DIAGNOSIS — G8220 Paraplegia, unspecified: Secondary | ICD-10-CM
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
## 2018-05-21 NOTE — Telephone Encounter (Signed)
Pt's appt has been rescheduled to 9/10 to see Dr. Irene Limbo. Pt husband is aware to arrive 30 minutes early. New letter mailed.

## 2018-05-25 ENCOUNTER — Telehealth: Payer: Self-pay

## 2018-05-25 NOTE — Telephone Encounter (Signed)
VM left to introduce Palliative Care and to offer to schedule a visit with NP.

## 2018-05-25 NOTE — Telephone Encounter (Signed)
Spoke with patient's son to verify phone number as [phone]

## 2018-05-27 ENCOUNTER — Telehealth: Payer: Self-pay

## 2018-05-27 NOTE — Telephone Encounter (Signed)
Spoke with patient husband concerning patient upcoming appointment verification per 8/28 phone msg forward call to RN Lanelle Bal) to assist patient with medical questions

## 2018-05-27 NOTE — Telephone Encounter (Signed)
Call placed to patient's husband, Sharon Warner, to offer to schedule visit with Palliative Care. Message not left as unsure of phone number

## 2018-06-02 ENCOUNTER — Telehealth: Payer: Self-pay | Admitting: Hematology

## 2018-06-02 ENCOUNTER — Telehealth: Payer: Self-pay

## 2018-06-02 NOTE — Telephone Encounter (Signed)
Received a staff msg from Audie Clear to reschedule the pt' appt. I cld and left the pt a vm to reschedule appt w/Dr. Irene Limbo on 9/10.

## 2018-06-02 NOTE — Telephone Encounter (Signed)
Phone call placed to patient's husband to offer to schedule visit with Palliative Care. Husband shared that patient is currently in [facility] and is scheduled to have surgery. Both patient and husband are looking forward to scheduling visit with Palliative Care. Plan is to speak next week to schedule visit.

## 2018-06-08 ENCOUNTER — Telehealth: Payer: Self-pay

## 2018-06-08 NOTE — Telephone Encounter (Signed)
Phone call placed to patient's husband to check in on patient and to offer to schedule visit with Palliative Care. Spoke with husband who shared that patient remains in the hospital at this time. Plan is to speak again at the end of the week.

## 2018-06-09 ENCOUNTER — Ambulatory Visit: Payer: Medicare Other | Admitting: Hematology

## 2018-06-09 ENCOUNTER — Telehealth: Payer: Self-pay | Admitting: *Deleted

## 2018-06-09 NOTE — Telephone Encounter (Signed)
Gave completed/signed form for [facility] physician statement back to medical records to process for pt.

## 2018-06-11 ENCOUNTER — Telehealth: Payer: Self-pay | Admitting: *Deleted

## 2018-06-11 NOTE — Telephone Encounter (Signed)
I faxed pt [facility] statement to [phone] on 06/09/18

## 2018-06-13 ENCOUNTER — Encounter (HOSPITAL_COMMUNITY): Payer: Self-pay | Admitting: Emergency Medicine

## 2018-06-13 ENCOUNTER — Inpatient Hospital Stay (HOSPITAL_COMMUNITY)
Admission: EM | Admit: 2018-06-13 | Discharge: 2018-06-30 | DRG: 207 | Disposition: E | Payer: Medicare Other | Attending: Critical Care Medicine | Admitting: Critical Care Medicine

## 2018-06-13 ENCOUNTER — Emergency Department (HOSPITAL_COMMUNITY): Payer: Medicare Other

## 2018-06-13 ENCOUNTER — Other Ambulatory Visit: Payer: Self-pay

## 2018-06-13 DIAGNOSIS — Z933 Colostomy status: Secondary | ICD-10-CM

## 2018-06-13 DIAGNOSIS — J9601 Acute respiratory failure with hypoxia: Secondary | ICD-10-CM | POA: Diagnosis present

## 2018-06-13 DIAGNOSIS — I719 Aortic aneurysm of unspecified site, without rupture: Secondary | ICD-10-CM | POA: Diagnosis not present

## 2018-06-13 DIAGNOSIS — Y846 Urinary catheterization as the cause of abnormal reaction of the patient, or of later complication, without mention of misadventure at the time of the procedure: Secondary | ICD-10-CM | POA: Diagnosis present

## 2018-06-13 DIAGNOSIS — Z87440 Personal history of urinary (tract) infections: Secondary | ICD-10-CM

## 2018-06-13 DIAGNOSIS — Z515 Encounter for palliative care: Secondary | ICD-10-CM | POA: Diagnosis present

## 2018-06-13 DIAGNOSIS — Z66 Do not resuscitate: Secondary | ICD-10-CM | POA: Diagnosis present

## 2018-06-13 DIAGNOSIS — I330 Acute and subacute infective endocarditis: Secondary | ICD-10-CM | POA: Diagnosis not present

## 2018-06-13 DIAGNOSIS — E870 Hyperosmolality and hypernatremia: Secondary | ICD-10-CM | POA: Diagnosis not present

## 2018-06-13 DIAGNOSIS — R0902 Hypoxemia: Secondary | ICD-10-CM

## 2018-06-13 DIAGNOSIS — Z1621 Resistance to vancomycin: Secondary | ICD-10-CM | POA: Diagnosis not present

## 2018-06-13 DIAGNOSIS — E11622 Type 2 diabetes mellitus with other skin ulcer: Secondary | ICD-10-CM | POA: Diagnosis not present

## 2018-06-13 DIAGNOSIS — T8451XD Infection and inflammatory reaction due to internal right hip prosthesis, subsequent encounter: Secondary | ICD-10-CM | POA: Diagnosis not present

## 2018-06-13 DIAGNOSIS — G4733 Obstructive sleep apnea (adult) (pediatric): Secondary | ICD-10-CM | POA: Diagnosis present

## 2018-06-13 DIAGNOSIS — Z887 Allergy status to serum and vaccine status: Secondary | ICD-10-CM

## 2018-06-13 DIAGNOSIS — I110 Hypertensive heart disease with heart failure: Secondary | ICD-10-CM | POA: Diagnosis present

## 2018-06-13 DIAGNOSIS — E46 Unspecified protein-calorie malnutrition: Secondary | ICD-10-CM | POA: Diagnosis present

## 2018-06-13 DIAGNOSIS — E876 Hypokalemia: Secondary | ICD-10-CM | POA: Diagnosis not present

## 2018-06-13 DIAGNOSIS — I959 Hypotension, unspecified: Secondary | ICD-10-CM | POA: Diagnosis present

## 2018-06-13 DIAGNOSIS — Z87828 Personal history of other (healed) physical injury and trauma: Secondary | ICD-10-CM | POA: Diagnosis not present

## 2018-06-13 DIAGNOSIS — B957 Other staphylococcus as the cause of diseases classified elsewhere: Secondary | ICD-10-CM | POA: Diagnosis not present

## 2018-06-13 DIAGNOSIS — Z86718 Personal history of other venous thrombosis and embolism: Secondary | ICD-10-CM

## 2018-06-13 DIAGNOSIS — Z880 Allergy status to penicillin: Secondary | ICD-10-CM

## 2018-06-13 DIAGNOSIS — J189 Pneumonia, unspecified organism: Secondary | ICD-10-CM | POA: Diagnosis present

## 2018-06-13 DIAGNOSIS — R64 Cachexia: Secondary | ICD-10-CM | POA: Diagnosis present

## 2018-06-13 DIAGNOSIS — E43 Unspecified severe protein-calorie malnutrition: Secondary | ICD-10-CM | POA: Diagnosis present

## 2018-06-13 DIAGNOSIS — I5022 Chronic systolic (congestive) heart failure: Secondary | ICD-10-CM | POA: Diagnosis present

## 2018-06-13 DIAGNOSIS — Z888 Allergy status to other drugs, medicaments and biological substances status: Secondary | ICD-10-CM

## 2018-06-13 DIAGNOSIS — D638 Anemia in other chronic diseases classified elsewhere: Secondary | ICD-10-CM | POA: Diagnosis present

## 2018-06-13 DIAGNOSIS — T83518A Infection and inflammatory reaction due to other urinary catheter, initial encounter: Secondary | ICD-10-CM | POA: Diagnosis present

## 2018-06-13 DIAGNOSIS — Z8249 Family history of ischemic heart disease and other diseases of the circulatory system: Secondary | ICD-10-CM

## 2018-06-13 DIAGNOSIS — Z794 Long term (current) use of insulin: Secondary | ICD-10-CM | POA: Diagnosis not present

## 2018-06-13 DIAGNOSIS — E119 Type 2 diabetes mellitus without complications: Secondary | ICD-10-CM | POA: Diagnosis present

## 2018-06-13 DIAGNOSIS — J9600 Acute respiratory failure, unspecified whether with hypoxia or hypercapnia: Secondary | ICD-10-CM | POA: Diagnosis not present

## 2018-06-13 DIAGNOSIS — Z8619 Personal history of other infectious and parasitic diseases: Secondary | ICD-10-CM | POA: Diagnosis not present

## 2018-06-13 DIAGNOSIS — A420 Pulmonary actinomycosis: Secondary | ICD-10-CM | POA: Diagnosis not present

## 2018-06-13 DIAGNOSIS — R6521 Severe sepsis with septic shock: Secondary | ICD-10-CM

## 2018-06-13 DIAGNOSIS — Z79899 Other long term (current) drug therapy: Secondary | ICD-10-CM

## 2018-06-13 DIAGNOSIS — F329 Major depressive disorder, single episode, unspecified: Secondary | ICD-10-CM | POA: Diagnosis present

## 2018-06-13 DIAGNOSIS — Z7982 Long term (current) use of aspirin: Secondary | ICD-10-CM

## 2018-06-13 DIAGNOSIS — Z96641 Presence of right artificial hip joint: Secondary | ICD-10-CM | POA: Diagnosis present

## 2018-06-13 DIAGNOSIS — Z833 Family history of diabetes mellitus: Secondary | ICD-10-CM

## 2018-06-13 DIAGNOSIS — I2782 Chronic pulmonary embolism: Secondary | ICD-10-CM | POA: Diagnosis present

## 2018-06-13 DIAGNOSIS — L89154 Pressure ulcer of sacral region, stage 4: Secondary | ICD-10-CM | POA: Diagnosis present

## 2018-06-13 DIAGNOSIS — Z1619 Resistance to other specified beta lactam antibiotics: Secondary | ICD-10-CM | POA: Diagnosis not present

## 2018-06-13 DIAGNOSIS — Z95828 Presence of other vascular implants and grafts: Secondary | ICD-10-CM | POA: Diagnosis not present

## 2018-06-13 DIAGNOSIS — I9589 Other hypotension: Secondary | ICD-10-CM | POA: Diagnosis present

## 2018-06-13 DIAGNOSIS — N319 Neuromuscular dysfunction of bladder, unspecified: Secondary | ICD-10-CM | POA: Diagnosis present

## 2018-06-13 DIAGNOSIS — A498 Other bacterial infections of unspecified site: Secondary | ICD-10-CM | POA: Diagnosis not present

## 2018-06-13 DIAGNOSIS — Z1630 Resistance to unspecified antimicrobial drugs: Secondary | ICD-10-CM | POA: Diagnosis not present

## 2018-06-13 DIAGNOSIS — T83511D Infection and inflammatory reaction due to indwelling urethral catheter, subsequent encounter: Secondary | ICD-10-CM | POA: Diagnosis not present

## 2018-06-13 DIAGNOSIS — Z01818 Encounter for other preprocedural examination: Secondary | ICD-10-CM

## 2018-06-13 DIAGNOSIS — R1310 Dysphagia, unspecified: Secondary | ICD-10-CM | POA: Diagnosis present

## 2018-06-13 DIAGNOSIS — B9689 Other specified bacterial agents as the cause of diseases classified elsewhere: Secondary | ICD-10-CM | POA: Diagnosis not present

## 2018-06-13 DIAGNOSIS — A419 Sepsis, unspecified organism: Secondary | ICD-10-CM

## 2018-06-13 DIAGNOSIS — Z6821 Body mass index (BMI) 21.0-21.9, adult: Secondary | ICD-10-CM

## 2018-06-13 DIAGNOSIS — T83511A Infection and inflammatory reaction due to indwelling urethral catheter, initial encounter: Secondary | ICD-10-CM

## 2018-06-13 DIAGNOSIS — N390 Urinary tract infection, site not specified: Secondary | ICD-10-CM | POA: Diagnosis present

## 2018-06-13 DIAGNOSIS — S71001D Unspecified open wound, right hip, subsequent encounter: Secondary | ICD-10-CM | POA: Diagnosis not present

## 2018-06-13 DIAGNOSIS — Z9911 Dependence on respirator [ventilator] status: Secondary | ICD-10-CM | POA: Diagnosis not present

## 2018-06-13 DIAGNOSIS — I340 Nonrheumatic mitral (valve) insufficiency: Secondary | ICD-10-CM | POA: Diagnosis not present

## 2018-06-13 DIAGNOSIS — I712 Thoracic aortic aneurysm, without rupture: Secondary | ICD-10-CM | POA: Diagnosis not present

## 2018-06-13 DIAGNOSIS — S71001A Unspecified open wound, right hip, initial encounter: Secondary | ICD-10-CM | POA: Diagnosis present

## 2018-06-13 DIAGNOSIS — G8220 Paraplegia, unspecified: Secondary | ICD-10-CM | POA: Diagnosis present

## 2018-06-13 DIAGNOSIS — Z7901 Long term (current) use of anticoagulants: Secondary | ICD-10-CM

## 2018-06-13 LAB — COMPREHENSIVE METABOLIC PANEL
ALBUMIN: 2.3 g/dL — AB (ref 3.5–5.0)
ALK PHOS: 141 U/L — AB (ref 38–126)
ALT: 30 U/L (ref 0–44)
AST: 41 U/L (ref 15–41)
Anion gap: 10 (ref 5–15)
BUN: 24 mg/dL — AB (ref 8–23)
CALCIUM: 10.2 mg/dL (ref 8.9–10.3)
CO2: 26 mmol/L (ref 22–32)
Chloride: 104 mmol/L (ref 98–111)
Creatinine, Ser: 0.39 mg/dL — ABNORMAL LOW (ref 0.44–1.00)
GFR calc Af Amer: >60 mL/min (ref >60)
GFR calc non Af Amer: >60 mL/min (ref >60)
GLUCOSE: 120 mg/dL — AB (ref 70–99)
Potassium: 4.2 mmol/L (ref 3.5–5.1)
Sodium: 140 mmol/L (ref 135–145)
TOTAL PROTEIN: 5.4 g/dL — AB (ref 6.5–8.1)
Total Bilirubin: 0.6 mg/dL (ref 0.3–1.2)

## 2018-06-13 LAB — CBC WITH DIFFERENTIAL/PLATELET
Abs Immature Granulocytes: 0 10*3/uL (ref 0.0–0.1)
BASOS PCT: 0 %
Basophils Absolute: 0 10*3/uL (ref 0.0–0.1)
EOS ABS: 0 10*3/uL (ref 0.0–0.7)
Eosinophils Relative: 0 %
HEMATOCRIT: 34.3 % — AB (ref 36.0–46.0)
Hemoglobin: 9.6 g/dL — ABNORMAL LOW (ref 12.0–15.0)
Immature Granulocytes: 0 %
LYMPHS ABS: 3.5 10*3/uL (ref 0.7–4.0)
Lymphocytes Relative: 37 %
MCH: 23.4 pg — AB (ref 26.0–34.0)
MCHC: 28 g/dL — ABNORMAL LOW (ref 30.0–36.0)
MCV: 83.5 fL (ref 78.0–100.0)
MONO ABS: 0.4 10*3/uL (ref 0.1–1.0)
MONOS PCT: 5 %
Neutro Abs: 5.6 10*3/uL (ref 1.7–7.7)
Neutrophils Relative %: 58 %
PLATELETS: 271 10*3/uL (ref 150–400)
RBC: 4.11 MIL/uL (ref 3.87–5.11)
RDW: 20.6 % — AB (ref 11.5–15.5)
WBC: 9.6 10*3/uL (ref 4.0–10.5)

## 2018-06-13 LAB — URINALYSIS, ROUTINE W REFLEX MICROSCOPIC
BILIRUBIN URINE: NEGATIVE
Bacteria, UA: NONE SEEN
Glucose, UA: NEGATIVE mg/dL
KETONES UR: NEGATIVE mg/dL
NITRITE: NEGATIVE
PH: 7 (ref 5.0–8.0)
Protein, ur: 30 mg/dL — AB
RBC / HPF: >50 RBC/hpf — ABNORMAL HIGH (ref 0–5)
Specific Gravity, Urine: 1.011 (ref 1.005–1.030)

## 2018-06-13 LAB — I-STAT ARTERIAL BLOOD GAS, ED
Bicarbonate: 25.1 mmol/L (ref 20.0–28.0)
O2 Saturation: 98 %
PCO2 ART: 42.2 mmHg (ref 32.0–48.0)
Patient temperature: 97.6
TCO2: 26 mmol/L (ref 22–32)
pH, Arterial: 7.38 (ref 7.350–7.450)
pO2, Arterial: 97 mmHg (ref 83.0–108.0)

## 2018-06-13 LAB — PROTIME-INR
INR: 1.41
Prothrombin Time: 17.1 seconds — ABNORMAL HIGH (ref 11.4–15.2)

## 2018-06-13 LAB — PROCALCITONIN: PROCALCITONIN: 0.13 ng/mL

## 2018-06-13 LAB — I-STAT CG4 LACTIC ACID, ED: Lactic Acid, Venous: 1.33 mmol/L (ref 0.5–1.9)

## 2018-06-13 LAB — BRAIN NATRIURETIC PEPTIDE: B Natriuretic Peptide: 42 pg/mL (ref 0.0–100.0)

## 2018-06-13 LAB — CORTISOL: CORTISOL PLASMA: 21.6 ug/dL

## 2018-06-13 MED ORDER — IPRATROPIUM-ALBUTEROL 0.5-2.5 (3) MG/3ML IN SOLN
3.0000 mL | RESPIRATORY_TRACT | Status: DC | PRN
  Administered 2018-06-13 – 2018-06-14 (×4): 3 mL via RESPIRATORY_TRACT
  Filled 2018-06-13 (×3): qty 3

## 2018-06-13 MED ORDER — ALBUTEROL SULFATE (2.5 MG/3ML) 0.083% IN NEBU
2.5000 mg | INHALATION_SOLUTION | Freq: Once | RESPIRATORY_TRACT | Status: AC
  Administered 2018-06-13: 2.5 mg via RESPIRATORY_TRACT

## 2018-06-13 MED ORDER — MIDODRINE HCL 5 MG PO TABS
5.0000 mg | ORAL_TABLET | Freq: Three times a day (TID) | ORAL | Status: DC
  Administered 2018-06-14 – 2018-06-16 (×8): 5 mg via ORAL
  Filled 2018-06-13 (×10): qty 1

## 2018-06-13 MED ORDER — APIXABAN 5 MG PO TABS: 5.0000 mg | ORAL_TABLET | ORAL | Status: DC

## 2018-06-13 MED ORDER — ALBUTEROL SULFATE (2.5 MG/3ML) 0.083% IN NEBU
INHALATION_SOLUTION | RESPIRATORY_TRACT | Status: AC
  Filled 2018-06-13: qty 6

## 2018-06-13 MED ORDER — PHENYLEPHRINE HCL-NACL 10-0.9 MG/250ML-% IV SOLN
0.0000 ug/min | INTRAVENOUS | Status: DC
  Filled 2018-06-13: qty 250

## 2018-06-13 MED ORDER — SODIUM CHLORIDE 0.9 % IV BOLUS
500.0000 mL | Freq: Once | INTRAVENOUS | Status: AC
  Administered 2018-06-13: 500 mL via INTRAVENOUS

## 2018-06-13 MED ORDER — FUROSEMIDE 10 MG/ML IJ SOLN
40.0000 mg | Freq: Once | INTRAMUSCULAR | Status: AC
  Administered 2018-06-13: 40 mg via INTRAVENOUS
  Filled 2018-06-13: qty 4

## 2018-06-13 MED ORDER — SODIUM CHLORIDE 0.9 % IV BOLUS (SEPSIS)
500.0000 mL | Freq: Once | INTRAVENOUS | Status: AC
  Administered 2018-06-13: 500 mL via INTRAVENOUS

## 2018-06-13 MED ORDER — ASPIRIN EC 81 MG PO TBEC
81.0000 mg | DELAYED_RELEASE_TABLET | Freq: Every day | ORAL | Status: DC
  Administered 2018-06-14 – 2018-06-18 (×5): 81 mg via ORAL
  Filled 2018-06-13 (×6): qty 1

## 2018-06-13 MED ORDER — APIXABAN 5 MG PO TABS
5.0000 mg | ORAL_TABLET | Freq: Two times a day (BID) | ORAL | Status: DC
  Administered 2018-06-14: 5 mg via ORAL
  Filled 2018-06-13: qty 1

## 2018-06-13 MED ORDER — NOREPINEPHRINE 4 MG/250ML-% IV SOLN: 0.0000 ug/min | INTRAVENOUS | Status: DC

## 2018-06-13 MED ORDER — SODIUM CHLORIDE 0.9 % IV SOLN
INTRAVENOUS | Status: DC
  Administered 2018-06-13 – 2018-06-14 (×2): via INTRAVENOUS

## 2018-06-13 MED ORDER — VANCOMYCIN HCL IN DEXTROSE 1-5 GM/200ML-% IV SOLN: 1000.0000 mg | Freq: Once | INTRAVENOUS | Status: DC

## 2018-06-13 MED ORDER — SODIUM CHLORIDE 0.9 % IV SOLN
1.0000 g | INTRAVENOUS | Status: DC
  Administered 2018-06-14: 1000 mg via INTRAVENOUS
  Filled 2018-06-13: qty 1

## 2018-06-13 MED ORDER — SODIUM CHLORIDE 0.9 % IV SOLN: 1.0000 g | INTRAVENOUS | Status: DC

## 2018-06-13 MED ORDER — SODIUM CHLORIDE 0.9 % IV SOLN
375.0000 mg | INTRAVENOUS | Status: AC
  Administered 2018-06-14 – 2018-06-15 (×2): 375 mg via INTRAVENOUS
  Filled 2018-06-13 (×3): qty 7.5

## 2018-06-13 MED ORDER — SODIUM CHLORIDE 0.9 % IV SOLN
1.0000 g | Freq: Once | INTRAVENOUS | Status: AC
  Administered 2018-06-13: 1 g via INTRAVENOUS
  Filled 2018-06-13: qty 1

## 2018-06-13 MED ORDER — SODIUM CHLORIDE 0.9 % IV SOLN
250.0000 mL | INTRAVENOUS | Status: DC | PRN
  Administered 2018-06-16: 250 mL via INTRAVENOUS
  Administered 2018-06-17: 14:00:00 via INTRAVENOUS
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
## 2018-06-13 NOTE — ED Notes (Addendum)
Pt having episodes of bradycardia, lowest HR 45. HR comes back up after 1 min. Intensive Care notified. See [facility]

## 2018-06-13 NOTE — ED Notes (Signed)
Pt unable to take Midodrine due to not being able to swallow well. EDP aware
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
## 2018-06-13 NOTE — ED Provider Notes (Addendum)
Pt's care assumed at 4pm.   5;30  Rn reports no improvement after IV lasix.   Pt breathing harder and more short of breath.  Pt is on 02 at 4 liters.  Pt is not on home 02.   I will consult hospitalist for admission. Dr. Wyline Copas request Critical care consult/   Critical evaluated and feels pt can be admitted to Hospitalist for stepdown.  I reconsulted hospitalist who will see and admit    Sharon Warner 06/02/2018 1731    Sharon Warner 06/20/2018 2124    Jola Schmidt, MD 06/14/18 [phone]

## 2018-06-13 NOTE — ED Notes (Signed)
Spoke to EDP regarding BP

## 2018-06-13 NOTE — Progress Notes (Signed)
Pharmacy Antibiotic Note  Sharon Warner is a 67 y.o. female admitted on 06/19/2018 with sepsis.  Pharmacy has been consulted for vancomycin and cefepime dosing.  Presented with SOB, known sacrum ulcer. Per notes from [facility] currently being treated with antibiotics for chronic R hip wound s/p hip replacement and stage 4 sacral ulcer - now on ertapenem and daptomycin until 10/8 (LD 9/13?). WBC WNL. Scr 0.39. PCT 0.13. LA 1.33.   -UCx from 9/2 at [facility] showing >3 species, probable contaminant.   Plan: Order vancomycin 1000 mg IV once Order cefepime 1 g IV once Will follow along with plans for admission to determine if continuing vancomycin/cefepime or adjusting given PTA antibiotics  Height: 5\' 1"  (154.9 cm) Weight: 102 lb (46.3 kg) IBW/kg (Calculated) : 47.8  Temp (24hrs), Avg:96.5 F (35.8 C), Min:96 F (35.6 C), Max:97 F (36.1 C)  Recent Labs  Lab 06/20/2018 1402  WBC 9.6  CREATININE 0.39*  LATICACIDVEN 1.33    Estimated Creatinine Clearance: 51.2 mL/min (A) (by C-G formula based on SCr of 0.39 mg/dL (L)).    Allergies  Allergen Reactions  . Clindamycin/Lincomycin Diarrhea  . Influenza Vaccines Other (See Comments)    Patient lost feeling in her legs and that spread  . Levaquin [Levofloxacin In D5w] Nausea Only  . Penicillins Other (See Comments)    From childhood: Has patient had a PCN reaction causing immediate rash, facial/tongue/throat swelling, SOB or lightheadedness with hypotension: Unknown Has patient had a PCN reaction causing severe rash involving mucus membranes or skin necrosis: Unknown Has patient had a PCN reaction that required hospitalization: No Has patient had a PCN reaction occurring within the last 10 years: No If all of the above answers are "NO", then may proceed with Cephalosporin use.  . Simvastatin Other (See Comments)    Reaction not recalled; PATIENT STATED THAT SHE "WILL NOT TAKE"  . Statins Other (See Comments)    PATIENT STATED THAT  SHE "WILL NOT TAKE" THESE    Antimicrobials this admission: Vancomycin 9/14 x1 Cefepime 9/14 x1  Dose adjustments this admission: N/A  Microbiology results: 9/14 BCx: sent 9/14 UCx: sent   Thank you for allowing pharmacy to be a part of this patient's care.  Doylene Canard, PharmD Clinical Pharmacist  Pager: [phone] Phone: [phone] 06/14/2018 10:23 PM

## 2018-06-14 ENCOUNTER — Inpatient Hospital Stay (HOSPITAL_COMMUNITY): Payer: Medicare Other

## 2018-06-14 DIAGNOSIS — I5022 Chronic systolic (congestive) heart failure: Secondary | ICD-10-CM

## 2018-06-14 DIAGNOSIS — N390 Urinary tract infection, site not specified: Secondary | ICD-10-CM

## 2018-06-14 DIAGNOSIS — J9601 Acute respiratory failure with hypoxia: Principal | ICD-10-CM

## 2018-06-14 DIAGNOSIS — T83511D Infection and inflammatory reaction due to indwelling urethral catheter, subsequent encounter: Secondary | ICD-10-CM

## 2018-06-14 DIAGNOSIS — I340 Nonrheumatic mitral (valve) insufficiency: Secondary | ICD-10-CM

## 2018-06-14 LAB — BLOOD GAS, ARTERIAL
Acid-base deficit: 3.4 mmol/L — ABNORMAL HIGH (ref 0.0–2.0)
Bicarbonate: 23.1 mmol/L (ref 20.0–28.0)
DRAWN BY: 519031
FIO2: 80
MECHVT: 380 mL
O2 Saturation: 95.1 %
PATIENT TEMPERATURE: 96
PCO2 ART: 53.2 mmHg — AB (ref 32.0–48.0)
PEEP: 5 cmH2O
PO2 ART: 78 mmHg — AB (ref 83.0–108.0)
RATE: 16 resp/min
pH, Arterial: 7.251 — ABNORMAL LOW (ref 7.350–7.450)

## 2018-06-14 LAB — RESPIRATORY PANEL BY PCR
Adenovirus: NOT DETECTED
Bordetella pertussis: NOT DETECTED
CORONAVIRUS 229E-RVPPCR: NOT DETECTED
CORONAVIRUS OC43-RVPPCR: NOT DETECTED
Chlamydophila pneumoniae: NOT DETECTED
Coronavirus HKU1: NOT DETECTED
Coronavirus NL63: NOT DETECTED
INFLUENZA B-RVPPCR: NOT DETECTED
Influenza A: NOT DETECTED
METAPNEUMOVIRUS-RVPPCR: NOT DETECTED
Mycoplasma pneumoniae: NOT DETECTED
PARAINFLUENZA VIRUS 1-RVPPCR: NOT DETECTED
PARAINFLUENZA VIRUS 2-RVPPCR: NOT DETECTED
PARAINFLUENZA VIRUS 3-RVPPCR: NOT DETECTED
Parainfluenza Virus 4: NOT DETECTED
Respiratory Syncytial Virus: NOT DETECTED
Rhinovirus / Enterovirus: NOT DETECTED

## 2018-06-14 LAB — PROCALCITONIN: Procalcitonin: 0.16 ng/mL

## 2018-06-14 LAB — CBC
HEMATOCRIT: 30.6 % — AB (ref 36.0–46.0)
HEMOGLOBIN: 8.4 g/dL — AB (ref 12.0–15.0)
MCH: 23.1 pg — ABNORMAL LOW (ref 26.0–34.0)
MCHC: 27.5 g/dL — ABNORMAL LOW (ref 30.0–36.0)
MCV: 84.1 fL (ref 78.0–100.0)
PLATELETS: 222 10*3/uL (ref 150–400)
RBC: 3.64 MIL/uL — AB (ref 3.87–5.11)
RDW: 20.4 % — ABNORMAL HIGH (ref 11.5–15.5)
WBC: 12.1 10*3/uL — AB (ref 4.0–10.5)

## 2018-06-14 LAB — BASIC METABOLIC PANEL
Anion gap: 7 (ref 5–15)
BUN: 21 mg/dL (ref 8–23)
CALCIUM: 9.4 mg/dL (ref 8.9–10.3)
CHLORIDE: 107 mmol/L (ref 98–111)
CO2: 25 mmol/L (ref 22–32)
Creatinine, Ser: 0.34 mg/dL — ABNORMAL LOW (ref 0.44–1.00)
GFR calc Af Amer: >60 mL/min (ref >60)
Glucose, Bld: 93 mg/dL (ref 70–99)
Potassium: 3.7 mmol/L (ref 3.5–5.1)
SODIUM: 139 mmol/L (ref 135–145)

## 2018-06-14 LAB — ECHOCARDIOGRAM COMPLETE
Height: 61 in
Weight: 1446.22 oz

## 2018-06-14 LAB — C-REACTIVE PROTEIN: CRP: 6.6 mg/dL — ABNORMAL HIGH (ref <1.0)

## 2018-06-14 LAB — PREALBUMIN: Prealbumin: <5 mg/dL — ABNORMAL LOW (ref 18–38)

## 2018-06-14 LAB — GLUCOSE, CAPILLARY
GLUCOSE-CAPILLARY: 83 mg/dL (ref 70–99)
GLUCOSE-CAPILLARY: 98 mg/dL (ref 70–99)

## 2018-06-14 LAB — PHOSPHORUS: PHOSPHORUS: 2.5 mg/dL (ref 2.5–4.6)

## 2018-06-14 LAB — LACTIC ACID, PLASMA: Lactic Acid, Venous: 1 mmol/L (ref 0.5–1.9)

## 2018-06-14 LAB — MAGNESIUM: Magnesium: 1.8 mg/dL (ref 1.7–2.4)

## 2018-06-14 LAB — TROPONIN I: Troponin I: <0.03 ng/mL (ref <0.03)

## 2018-06-14 LAB — MRSA PCR SCREENING: MRSA by PCR: NEGATIVE

## 2018-06-14 MED ORDER — APIXABAN 5 MG PO TABS
5.0000 mg | ORAL_TABLET | Freq: Two times a day (BID) | ORAL | Status: DC
  Administered 2018-06-14 – 2018-06-21 (×14): 5 mg
  Filled 2018-06-14 (×14): qty 1

## 2018-06-14 MED ORDER — WHITE PETROLATUM EX OINT
TOPICAL_OINTMENT | CUTANEOUS | Status: AC
  Administered 2018-06-14: 02:00:00
  Filled 2018-06-14: qty 28.35

## 2018-06-14 MED ORDER — SODIUM CHLORIDE 0.9 % IV SOLN
1.0000 g | Freq: Two times a day (BID) | INTRAVENOUS | Status: DC
  Administered 2018-06-14 – 2018-06-16 (×4): 1 g via INTRAVENOUS
  Filled 2018-06-14 (×5): qty 1

## 2018-06-14 MED ORDER — MIDAZOLAM HCL 2 MG/2ML IJ SOLN
1.0000 mg | Freq: Once | INTRAMUSCULAR | Status: AC
  Administered 2018-06-14: 1 mg via INTRAVENOUS

## 2018-06-14 MED ORDER — FENTANYL CITRATE (PF) 100 MCG/2ML IJ SOLN
INTRAMUSCULAR | Status: AC
  Filled 2018-06-14: qty 2

## 2018-06-14 MED ORDER — MIDAZOLAM HCL 2 MG/2ML IJ SOLN
2.0000 mg | Freq: Once | INTRAMUSCULAR | Status: DC
  Filled 2018-06-14: qty 2

## 2018-06-14 MED ORDER — FENTANYL CITRATE (PF) 100 MCG/2ML IJ SOLN
12.5000 ug | INTRAMUSCULAR | Status: DC | PRN
  Administered 2018-06-14: 12.5 ug via INTRAVENOUS
  Administered 2018-06-14: 25 ug via INTRAVENOUS
  Administered 2018-06-15: 12.5 ug via INTRAVENOUS
  Administered 2018-06-17 – 2018-06-19 (×2): 25 ug via INTRAVENOUS
  Filled 2018-06-14 (×6): qty 2

## 2018-06-14 MED ORDER — PRO-STAT SUGAR FREE PO LIQD
30.0000 mL | Freq: Two times a day (BID) | ORAL | Status: DC
  Administered 2018-06-14 – 2018-06-21 (×15): 30 mL
  Filled 2018-06-14 (×15): qty 30

## 2018-06-14 MED ORDER — SODIUM CHLORIDE 0.9 % IV BOLUS (SEPSIS)
500.0000 mL | Freq: Once | INTRAVENOUS | Status: AC
  Administered 2018-06-14: 500 mL via INTRAVENOUS

## 2018-06-14 MED ORDER — GUAIFENESIN ER 600 MG PO TB12
600.0000 mg | ORAL_TABLET | Freq: Two times a day (BID) | ORAL | Status: DC | PRN
  Administered 2018-06-14: 600 mg via ORAL
  Filled 2018-06-14 (×2): qty 1

## 2018-06-14 MED ORDER — SODIUM CHLORIDE 0.9 % IV SOLN
1.0000 g | Freq: Two times a day (BID) | INTRAVENOUS | Status: DC
  Filled 2018-06-14: qty 1

## 2018-06-14 MED ORDER — ORAL CARE MOUTH RINSE
15.0000 mL | OROMUCOSAL | Status: DC
  Administered 2018-06-14 – 2018-06-21 (×65): 15 mL via OROMUCOSAL

## 2018-06-14 MED ORDER — DEXMEDETOMIDINE HCL IN NACL 400 MCG/100ML IV SOLN
0.4000 ug/kg/h | INTRAVENOUS | Status: DC
  Administered 2018-06-14: 0.4 ug/kg/h via INTRAVENOUS
  Filled 2018-06-14: qty 100

## 2018-06-14 MED ORDER — VITAL HIGH PROTEIN PO LIQD
1000.0000 mL | ORAL | Status: DC
  Administered 2018-06-14: 1000 mL

## 2018-06-14 MED ORDER — DOCUSATE SODIUM 50 MG/5ML PO LIQD
100.0000 mg | Freq: Two times a day (BID) | ORAL | Status: DC | PRN
  Administered 2018-06-17: 100 mg
  Filled 2018-06-14: qty 10

## 2018-06-14 MED ORDER — ORAL CARE MOUTH RINSE
15.0000 mL | Freq: Two times a day (BID) | OROMUCOSAL | Status: DC
  Administered 2018-06-14: 15 mL via OROMUCOSAL

## 2018-06-14 MED ORDER — NOREPINEPHRINE 16 MG/250ML-% IV SOLN
0.0000 ug/min | INTRAVENOUS | Status: DC
  Administered 2018-06-14: 2 ug/min via INTRAVENOUS
  Administered 2018-06-15: 35 ug/min via INTRAVENOUS
  Administered 2018-06-15: 15 ug/min via INTRAVENOUS
  Administered 2018-06-17: 18 ug/min via INTRAVENOUS
  Administered 2018-06-17: 14 ug/min via INTRAVENOUS
  Administered 2018-06-20: 3 ug/min via INTRAVENOUS
  Filled 2018-06-14 (×7): qty 250

## 2018-06-14 MED ORDER — SODIUM CHLORIDE 3 % IN NEBU
4.0000 mL | INHALATION_SOLUTION | Freq: Two times a day (BID) | RESPIRATORY_TRACT | Status: AC
  Administered 2018-06-14 – 2018-06-16 (×5): 4 mL via RESPIRATORY_TRACT
  Filled 2018-06-14 (×5): qty 4

## 2018-06-14 MED ORDER — FENTANYL CITRATE (PF) 100 MCG/2ML IJ SOLN
25.0000 ug | Freq: Once | INTRAMUSCULAR | Status: AC
  Administered 2018-06-14: 25 ug via INTRAVENOUS

## 2018-06-14 MED ORDER — CHLORHEXIDINE GLUCONATE 0.12% ORAL RINSE (MEDLINE KIT)
15.0000 mL | Freq: Two times a day (BID) | OROMUCOSAL | Status: DC
  Administered 2018-06-14 – 2018-06-21 (×14): 15 mL via OROMUCOSAL

## 2018-06-14 MED ORDER — ENSURE ENLIVE PO LIQD
237.0000 mL | Freq: Three times a day (TID) | ORAL | Status: DC
  Administered 2018-06-14: 237 mL via ORAL

## 2018-06-14 MED ORDER — MAGNESIUM SULFATE 2 GM/50ML IV SOLN
2.0000 g | Freq: Once | INTRAVENOUS | Status: AC
  Administered 2018-06-14: 2 g via INTRAVENOUS
  Filled 2018-06-14: qty 50

## 2018-06-14 MED ORDER — MIDAZOLAM HCL 2 MG/2ML IJ SOLN
INTRAMUSCULAR | Status: AC
  Filled 2018-06-14: qty 2

## 2018-06-14 MED ORDER — ETOMIDATE 2 MG/ML IV SOLN
10.0000 mg | Freq: Once | INTRAVENOUS | Status: AC
  Administered 2018-06-14: 10 mg via INTRAVENOUS

## 2018-06-14 NOTE — Procedures (Addendum)
Intubation Procedure Note Sharon Warner [id number] [birth year]  Procedure: Intubation Indications: Respiratory insufficiency  Procedure Details Consent: Risks of procedure as well as the alternatives and risks of each were explained to the (patient/caregiver).  Consent for procedure obtained. Husband, son and patient consented.    Time Out: Verified patient identification, verified procedure, site/side was marked, verified correct patient position, special equipment/implants available, medications/allergies/relevent history reviewed, required imaging and test results available.  Performed  Maximum sterile technique was used including cap, gloves, gown, hand hygiene and mask.  MAC and 3   Color change on ETCO2 detector, condensation in tube, bilateral breath sounds and returning volumes on vent.   Medications utilized: 25 mcg fentanyl, 1 mg versed, 10 mg etomidate.  Levophed initiated prior to sedation.  1 attempt, no difficulty passing 7.5 ETT past cords.    Evaluation Hemodynamic Status: BP stable throughout; O2 sats: stable throughout Patient's Current Condition: stable Complications: No apparent complications Patient tolerated procedure well. Chest X-ray ordered to verify placement.  CXR: pending.  Procedure performed under direct supervision of Dr. Lake Bells and with ultrasound guidance for real time vessel cannulation.      [name], NP-C [facility] Pgr: [phone] or if no answer [phone] 06/14/2018, 3:16 PM

## 2018-06-14 NOTE — Progress Notes (Addendum)
Sharon Warner  BMW:[id number] DOB: 1951-05-14 DOA: 06/07/2018 PCP: [name], MD    LOS: 1 day   Reason for Consult / Chief Complaint:  Hypotension   Consulting MD and date:  Blima Dessert  HPI/Summary of hospital stay:  67 year old female with PMH Paraplegia, Systolic HF (EF 27-25), Stage 4 Ulcer to Sacrum, Right Thigh wound VAC secondary to removal of surgical hardware, Chronic PE, Newly diagnosis DVT to Right LE on Eliquis, Chronic Hypotension (Systolic Baseline 90), OSA not on CPAP    Recent Admission to [facility] with Bacteremia with Staphyloccous capitis secondary to infected hip hardware on Daptomycin and Ertapenem.  Presents to ED 9/14 with acute dyspnea. Husband states that this states symptoms started at 0900. Recent decrease in Lasix from 40 to 25 mg. CXR with no acute infiltrate. On arrival oxygen saturation 100%. BP 97/70. Afebrile, LA 1.3. Given 40 meq lasix with 825 ml output. BP decreased to 78/60. Given 500 ml bolus without improvement. PCCM consulted for evaluation.  On arrival to bedside patient alert and oriented. Fluid bolus still going. BP now 90/65. [facility]ad asked to admit. Shortly after called back as patient was stated to be lethargic with BP 65/28, additional fluid bolus being administered without improvement. Plans to start Levophed and transfer to ICU. On arrival to ICU patient alert and oriented with BP 98/71 without pressors.   Subjective:  PT reports she is cold, no other acute complaints.  Thought she might have had a UTI prior to coming into the hospital and came for shortness of breath.  States her SBP usually runs 90-100.  Not started on vasopressors. Afebrile.     Objective   Blood pressure (!) 75/56, pulse 68, temperature 97.6 F (36.4 C), temperature source Axillary, resp. rate 17, height 5\' 1"  (1.549 m), weight 41 kg, SpO2 100 %.        Intake/Output Summary (Last 24 hours) at 06/14/2018 0942 Last data filed at 06/14/2018 0800 Gross per 24  hour  Intake 2869.25 ml  Output 1745 ml  Net 1124.25 ml   Filed Weights   06/02/2018 1255 06/14/18 0500  Weight: 46.3 kg 41 kg    Examination: General: chronically ill appearing female in NAD lying in bed HEENT: MM pink/moist, coating on lips, no jvd, temporal wasting Neuro: opens eyes to voice, faint/raspy voice but speech clear, oriented, generalized weakness CV: s1s2 rrr, no m/r/g PULM: even/non-labored, lungs bilaterally diminished  DG:UYQI, non-tender, bsx4 active  Extremities: warm/dry, 1-2+ generalized pitting edema  Skin: no rashes or lesions  Consults: date of consult/date signed off & final recs:   Procedures: Left PICC >>   Significant Diagnostic Tests: Cortisol 9/15 >> 21.6 CT Chest 9/15 >>   Micro Data: Blood 9/15 >> Urine 9/15 >>  RVP 9/15 >> negative  Antimicrobials:  Daptomycin (Stop Date 10/18) >> Ertapenem (Stop Date 10/18) >> Cefepime 9/14 >> 9/15   Resolved Hospital Problem list     Assessment & Plan:   Acute on Chronic Hypotension Baseline Systolic 90 -On PRN Midodrine at home TID -Given Lasix in ED, noted after to have BP 60-70, given fluid with improvement -BNP 42, troponin negative, lactic acid 1 H/O Systolic Heart Failure with EF 40-45 P: Cardiac monitoring  MAP goal >60  Continue midodrine 5mg  TID  Hold home lasix  Continue ASA NS @ 74ml/hr (consider reduction 9/16)   Recent Bacteremia with Staphylococcus capitis secondary to infected right hip hardware  -LA 1.0, PCT 0.13 -Does not  appear to have new infection  P: Follow fever curve / WBC trend  Await cultures  Continue Daptomycin + Ertapenem, planned stop date 10/18  Stage 4 ulcer to sacrum - present on admit Shoulder Decubitus Ulcers - present on admit  Surgical wound to right thigh with VAC placement  P: WOC following, appreciate input  Patient was due for dressing change 9/15   Neurogenic Bladder with Chronic Foley  Hx Recurrent UTI  P: Continue foley catheter    Catheter care per protocol  Dyspnea  - negative for acute PE, eosinophils negative on smear (eosinophil / dapto), ??if this is chronic illness /deconditioning with aspiration. Chronic PE -Recent CTA on 9/7 with no clot burden, noted to have tiny filling defect in right lower lobe segmental artery  Recent Diagnosis Right LE DVT  P: Continue Eliquis   Assess CT chest  Add chest PT  BID 3% NS neb x3 days  SLP evaluation for aspiration  Severe Protein-Calorie Malnutrition  At Risk Dysphagia / Aspiration P: Nutrition consulted, await input  Advance PO diet  Add ensure TID  Disposition / Summary of Today's Plan 06/14/18   Consider transfer to SDU.  BP remained stable / not on pressors.  Continue gentle IVF's for now.       DVT prophylaxis: Eliquis  GI prophylaxis: N/A  Diet: Regular  Mobility:Bedrest  Code Status: Full Code  Family Communication: Patient updated on plan of care 9/15 am.  Husband updated at bedside.  Given overall deconditioning, discussed concept of mechanical ventilation with patient while her husband was at bedside. She indicates she would want to have ventilator support if needed.    Labs   CBC: Recent Labs  Lab 06/11/2018 1402 06/14/18 0321  WBC 9.6 12.1*  NEUTROABS 5.6  --   HGB 9.6* 8.4*  HCT 34.3* 30.6*  MCV 83.5 84.1  PLT 271 222   Basic Metabolic Panel: Recent Labs  Lab 06/25/2018 1402 06/14/18 0321  NA 140 139  K 4.2 3.7  CL 104 107  CO2 26 25  GLUCOSE 120* 93  BUN 24* 21  CREATININE 0.39* 0.34*  CALCIUM 10.2 9.4  MG  --  1.8  PHOS  --  2.5   GFR: Estimated Creatinine Clearance: 45.4 mL/min (A) (by C-G formula based on SCr of 0.34 mg/dL (L)). Recent Labs  Lab 06/07/2018 1402 06/01/2018 2015 06/14/18 0047 06/14/18 0321  PROCALCITON  --  0.13  --  0.16  WBC 9.6  --   --  12.1*  LATICACIDVEN 1.33  --  1.0  --    Liver Function Tests: Recent Labs  Lab 06/19/2018 1402  AST 41  ALT 30  ALKPHOS 141*  BILITOT 0.6  PROT 5.4*   ALBUMIN 2.3*   No results for input(s): LIPASE, AMYLASE in the last 168 hours. No results for input(s): AMMONIA in the last 168 hours. ABG    Component Value Date/Time   PHART 7.380 06/28/2018 2001   PCO2ART 42.2 06/16/2018 2001   PO2ART 97.0 06/09/2018 2001   HCO3 25.1 06/19/2018 2001   TCO2 26 06/06/2018 2001   O2SAT 98.0 06/14/2018 2001    Coagulation Profile: Recent Labs  Lab 06/16/2018 1402  INR 1.41   Cardiac Enzymes: Recent Labs  Lab 06/14/18 0323  TROPONINI <0.03   HbA1C: Hgb A1c MFr Bld  Date/Time Value Ref Range Status  03/15/2018 11:37 AM 4.4 (L) 4.8 - 5.6 % Final    Comment:    (NOTE) Pre diabetes:  5.7%-6.4% Diabetes:              >6.4% Glycemic control for   <7.0% adults with diabetes   04/17/2017 04:56 AM 5.0 4.8 - 5.6 % Final    Comment:    (NOTE)         Pre-diabetes: 5.7 - 6.4         Diabetes: >6.4         Glycemic control for adults with diabetes: <7.0    CBG: Recent Labs  Lab 06/14/18 0042  GLUCAP 98    [name], NP-C [facility] Pgr: [phone] or if no answer [phone] 06/14/2018, 9:42 AM

## 2018-06-14 NOTE — Progress Notes (Signed)
SLP Cancellation Note  Patient Details Name: Abbagail Scaff MRN: [id number] DOB: [birth year]   Cancelled treatment:       Reason Eval/Treat Not Completed: Medical issues which prohibited therapy;nursing deferred d/t team preparing to intubate when SLP arrived to complete BSE; will re-attempt when pt able.   Elvina Sidle, M.S., CCC-SLP 06/14/2018, 4:20 PM

## 2018-06-14 NOTE — Progress Notes (Signed)
  Echocardiogram 2D Echocardiogram has been performed.  Jennette Dubin 06/14/2018, 11:07 AM

## 2018-06-14 NOTE — Progress Notes (Signed)
Called by RN to assess patient for marginal respiratory status.  Patient reports she is fatigued, feels like she is wearing out.  O2 sats dipping down to 89%.  Patient has difficulty with voice projection, is unable to cough / clear secretions.  There are concerns for possible aspiration due to weakness.  Reviewed with family at bedside need for intubation for airway protection.    Additionally, ECHO results returned with concern for possible small vegetation on mitral valve & moderate pericardial effusion without tamponade physiology.  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  She is on daptomycin, ertapenem.     Plan: Now intubation  Sedation ordered for comfort  Begin nutrition  Will need TEE   [name], NP-C [facility] Pgr: [phone] or if no answer [phone] 06/14/2018, 2:33 PM

## 2018-06-15 ENCOUNTER — Telehealth: Payer: Self-pay

## 2018-06-15 DIAGNOSIS — J9600 Acute respiratory failure, unspecified whether with hypoxia or hypercapnia: Secondary | ICD-10-CM

## 2018-06-15 LAB — BASIC METABOLIC PANEL
Anion gap: 7 (ref 5–15)
BUN: 30 mg/dL — AB (ref 8–23)
CO2: 24 mmol/L (ref 22–32)
Calcium: 9.5 mg/dL (ref 8.9–10.3)
Chloride: 113 mmol/L — ABNORMAL HIGH (ref 98–111)
Creatinine, Ser: 0.49 mg/dL (ref 0.44–1.00)
GFR calc Af Amer: >60 mL/min (ref >60)
GLUCOSE: 106 mg/dL — AB (ref 70–99)
POTASSIUM: 3.3 mmol/L — AB (ref 3.5–5.1)
Sodium: 144 mmol/L (ref 135–145)

## 2018-06-15 LAB — CBC
HEMATOCRIT: 29.4 % — AB (ref 36.0–46.0)
HEMOGLOBIN: 8.4 g/dL — AB (ref 12.0–15.0)
MCH: 23.6 pg — AB (ref 26.0–34.0)
MCHC: 28.6 g/dL — ABNORMAL LOW (ref 30.0–36.0)
MCV: 82.6 fL (ref 78.0–100.0)
PLATELETS: 262 10*3/uL (ref 150–400)
RBC: 3.56 MIL/uL — AB (ref 3.87–5.11)
RDW: 20.3 % — ABNORMAL HIGH (ref 11.5–15.5)
WBC: 31.3 10*3/uL — AB (ref 4.0–10.5)

## 2018-06-15 LAB — URINE CULTURE

## 2018-06-15 LAB — PROCALCITONIN: Procalcitonin: 3.57 ng/mL

## 2018-06-15 LAB — GLUCOSE, CAPILLARY
GLUCOSE-CAPILLARY: 99 mg/dL (ref 70–99)
Glucose-Capillary: 111 mg/dL — ABNORMAL HIGH (ref 70–99)
Glucose-Capillary: 118 mg/dL — ABNORMAL HIGH (ref 70–99)
Glucose-Capillary: 123 mg/dL — ABNORMAL HIGH (ref 70–99)
Glucose-Capillary: 125 mg/dL — ABNORMAL HIGH (ref 70–99)
Glucose-Capillary: 135 mg/dL — ABNORMAL HIGH (ref 70–99)

## 2018-06-15 LAB — MAGNESIUM: Magnesium: 2.1 mg/dL (ref 1.7–2.4)

## 2018-06-15 LAB — PHOSPHORUS: Phosphorus: 2.5 mg/dL (ref 2.5–4.6)

## 2018-06-15 MED ORDER — ALBUMIN HUMAN 25 % IV SOLN
25.0000 g | Freq: Once | INTRAVENOUS | Status: AC
  Administered 2018-06-15: 12.5 g via INTRAVENOUS
  Filled 2018-06-15: qty 50

## 2018-06-15 MED ORDER — SODIUM CHLORIDE 0.9 % IV SOLN
375.0000 mg | INTRAVENOUS | Status: DC
  Administered 2018-06-16 – 2018-06-20 (×5): 375 mg via INTRAVENOUS
  Filled 2018-06-15 (×6): qty 7.5

## 2018-06-15 MED ORDER — POTASSIUM CHLORIDE 20 MEQ/15ML (10%) PO SOLN
20.0000 meq | ORAL | Status: AC
  Administered 2018-06-15 (×2): 20 meq
  Filled 2018-06-15 (×2): qty 15

## 2018-06-15 MED ORDER — FAMOTIDINE IN NACL 20-0.9 MG/50ML-% IV SOLN
20.0000 mg | Freq: Two times a day (BID) | INTRAVENOUS | Status: DC
  Administered 2018-06-15 – 2018-06-18 (×8): 20 mg via INTRAVENOUS
  Filled 2018-06-15 (×9): qty 50

## 2018-06-15 MED ORDER — VITAL AF 1.2 CAL PO LIQD
1000.0000 mL | ORAL | Status: DC
  Administered 2018-06-15 – 2018-06-17 (×3)
  Filled 2018-06-15: qty 1000

## 2018-06-15 MED ORDER — JUVEN PO PACK
1.0000 | PACK | Freq: Two times a day (BID) | ORAL | Status: DC
  Administered 2018-06-15 – 2018-06-21 (×12): 1
  Filled 2018-06-15 (×13): qty 1

## 2018-06-15 NOTE — Care Management (Signed)
Pt has had multiple admits at [place], [place] pt active with [facility]. Husband is pts sole caretaker in the home.  PCM received call from [facility] liaison - PTA pt was active with agency in the home for [facility].   Pts care in the home is very complex including; stage 4 wounds requiring wound vac, IV antibiotics.  Per [facility] pt will not be accepted back at discharge due to her needs and required services no longer meet home heath parameters (pt requires 2 HHRN each trip that spend approximately 4 hours every session, husband is not readily available when needed).  CM provided potential referral to [facility].

## 2018-06-15 NOTE — Progress Notes (Signed)
[place] Progress Note Patient Name: Sharon Warner DOB: 01/18/1951 MRN: [id number]   Date of Service  06/15/2018  HPI/Events of Note  Notified of need for stress ulcer prophylaxis.   eICU Interventions  Will order Pepcid IV.      Intervention Category Intermediate Interventions: Best-practice therapies (e.g. DVT, beta blocker, etc.)  Sommer,Steven Eugene 06/15/2018, 2:59 AM

## 2018-06-15 NOTE — Progress Notes (Signed)
Initial Nutrition Assessment  DOCUMENTATION CODES:   Severe malnutrition in context of chronic illness  INTERVENTION:    Vital AF 1.2 at 40 ml/h (960 ml per day)  Pro-stat 30 ml via tube BID  Provides 1352 kcal, 102 gm protein, 779 ml free water daily   Juven 1 packet mixed with 8 oz water BID via tube; supplement provides CaHMB, glutamine, arginine, collagen protein, and micronutrients to promote wound healing.  NUTRITION DIAGNOSIS:   Severe Malnutrition related to chronic illness(CHF, paraplegia) as evidenced by severe muscle depletion, percent weight loss(9% weight loss within 3 months).  GOAL:   Patient will meet greater than or equal to 90% of their needs  MONITOR:   Vent status, TF tolerance, Labs, Skin  REASON FOR ASSESSMENT:   Ventilator, Consult Enteral/tube feeding initiation and management  ASSESSMENT:   67 yo female with PMH of HTN, paraplegia, chronic Foley catheter, stage 4 sacral ulcer, DM, and CHF who was admitted on 9/14 with dyspnea r/t fluid overload. Required intubation 9/15.   Discussed patient with RN today. Received MD Consult for TF initiation and management. OG tube in place. Currently receiving Vital High Protein at 40 ml/h with Pro-stat 30 ml BID, tolerating well.   Patient is currently intubated on ventilator support MV: 11 L/min Temp (24hrs), Avg:98.1 F (36.7 C), Min:97.5 F (36.4 C), Max:98.5 F (36.9 C)  Propofol: none  Labs reviewed. Potassium 3.3 (L) CBG's: 99-111 Medications reviewed and include KCl.  Weight down by >/= 9% within the past 3 months. 60.8 kg (03/16/18) --> 55.3 kg (06/15/18)  Patient's husband reports that patient has had trouble chewing & swallowing for the past several months. She was refusing to eat during the time that she was in and out of hospitals and nursing facilities over the past year or so. She is followed at the wound center for her sacral and thigh wounds. Husband was aiming for a goal of 100-120 gm  protein per day as recommended by the physician at the wound center. She drank Premier Protein TID and Pro-stat once daily. She was also taking a MVI, vitamin D, and omegas. She does not like Ensure and Boost supplements. She has had [employer] in the past and likes it mixed with milk. Husband is very familiar with high protein foods and ways to increase protein intake PO.    NUTRITION - FOCUSED PHYSICAL EXAM:    Most Recent Value  Orbital Region  Moderate depletion  Upper Arm Region  No depletion  Thoracic and Lumbar Region  Unable to assess  Buccal Region  Unable to assess  Temple Region  Moderate depletion  Clavicle Bone Region  Severe depletion  Clavicle and Acromion Bone Region  Severe depletion  Scapular Bone Region  Severe depletion  Dorsal Hand  Unable to assess  Patellar Region  Unable to assess  Anterior Thigh Region  Mild depletion  Posterior Calf Region  Unable to assess  Edema (RD Assessment)  None  Hair  Reviewed  Eyes  Unable to assess  Mouth  Unable to assess  Skin  Reviewed  Nails  Unable to assess       Diet Order:   Diet Order            Diet NPO time specified  Diet effective now              EDUCATION NEEDS:   Education needs have been addressed(with husband)  Skin:  Skin Assessment: Skin Integrity Issues: Skin Integrity  Issues:: Stage II, Stage III, Unstageable Stage II: shoulder Stage III: thigh Unstageable: sacrum  Last BM:  9/16 (colostomy)  Height:   Ht Readings from Last 1 Encounters:  06/15/2018 5\' 1"  (1.549 m)    Weight:   Wt Readings from Last 1 Encounters:  06/15/18 55.3 kg    Ideal Body Weight:  47.7 kg  BMI:  Body mass index is 23.04 kg/m.  Estimated Nutritional Needs:   Kcal:  1300  Protein:  95-110 gm  Fluid:  1.5 L    Molli Barrows, RD, LDN, [place] Pager [phone] After Hours Pager [phone]

## 2018-06-15 NOTE — Progress Notes (Signed)
SLP Cancellation Note  Patient Details Name: Sharon Warner MRN: [id number] DOB: 1951-09-21   Cancelled treatment:       Reason Eval/Treat Not Completed: Medical issues which prohibited therapy (intubated).   Germain Osgood 06/15/2018, 7:41 AM  Germain Osgood, M.A. [employer] Acute Environmental education officer [phone] Office [phone]

## 2018-06-15 NOTE — Progress Notes (Signed)
[facility] ADULT ICU REPLACEMENT PROTOCOL FOR AM LAB REPLACEMENT ONLY  The patient does apply for the [facility] Adult ICU Electrolyte Replacment Protocol based on the criteria listed below:   1. Is GFR >/= 40 ml/min? Yes.    Patient's GFR today is >60 2. Is urine output >/= 0.5 ml/kg/hr for the last 6 hours? Yes.   Patient's UOP is 0.8 ml/kg/hr 3. Is BUN < 60 mg/dL? Yes.    Patient's BUN today is 30 4. Abnormal electrolyte(s):k 3.3 5. Ordered repletion with: protocol 6. If a panic level lab has been reported, has the CCM MD in charge been notified? No..   Physician:    Ronda Fairly A 06/15/2018 6:07 AM

## 2018-06-15 NOTE — Consult Note (Signed)
[place] Nurse wound consult note Patient evaluated in [facility] 2M05 with the assistance of her primary care RN, Silva Bandy. Reason for Consult: leaking ostomy and VAC change Wound type: Right trochanter is a surgical site wound.  It measures 9.5 cm x 5.5 cm x 2 cm and has undermining in the following locations:  At 12 o'clock it measures 4 cm; at 9 o'clock it measures 2.5 cm, and at 6 o'clock it measures 5.5 cm.  The wound bed is approximately 75 % pink, the remainder is consistent with tendon or ligament.  I used an entire small VAC black foam dressing and was able to taper a piece to place in the 6 o'clock undermining as well as fit the remainder in the other undermined areas.  A no leak seal was obtained immediately.  This is a M-W-F change by primary RN staff. There is a stage 4 PI to the sacrum that measures 5 cm x 6.2 cm x 1.3 cm.  It is 53% pink, slick tissue with rolled wound edges.  The plan of care for this is saline moistened gauze covered with ABD twice daily. The right ischium has a purple discolored area consistent with a DTI that measures 1.2 cm x 1.2 cm.  The plan of care for this is a foam dressing; change every 3 - 5 days. The right scapula has an UNSTAGEABLE wound completely covered with yellow slough.  This measures 1.8 cm x 3 cm.  The plan of care for this wound is a thin hydrocolloid; change every 3 - 5 days.  [place] Nurse ostomy follow up Stoma type/location: LUQ fecal ostomy. Stoma measures 1 inch, round, budded.  Peristomal assessment: Intact Treatment options for stomal/peristomal skin: Barrier ring Output Soft brown in pouch Ostomy pouching: 2pc.  Pouch Lawson # [phone]; Nils Pyle # 2, and barrier ring [place] # [phone] requested by A-11. Monitor the wound area(s) for worsening of condition such as: Signs/symptoms of infection,  Increase in size,  Development of or worsening of odor, Development of pain, or increased pain at the affected locations.  Notify the medical team if any of these  develop.  Thank you for the consult.  Discussed plan of care with the patient and bedside nurse.  [place] nurse will not follow at this time.  Please re-consult the [place] team if needed.  Val Riles, RN, MSN, CWOCN, CNS-BC, pager [phone]

## 2018-06-15 NOTE — Progress Notes (Signed)
Sharon Warner  MBT:[id number] DOB: March 05, 1951 DOA: 06/12/2018 PCP: [name], MD    LOS: 2 days   Reason for Consult / Chief Complaint:  Hypotension   Consulting MD and date:  [name] - TRH. 9/14  HPI/Summary of hospital stay:  67 year old female with PMH Paraplegia, Systolic HF (EF 53-64), Stage 4 Ulcer to Sacrum, Right Thigh wound VAC secondary to removal of surgical hardware, Chronic PE, Newly diagnosis DVT to Right LE on Eliquis, Chronic Hypotension (Systolic Baseline 90), OSA not on CPAP    Recent Admission to [facility] with Bacteremia with Staphyloccous capitis secondary to infected hip hardware on Daptomycin and Ertapenem.  Presents to ED 9/14 with acute dyspnea. Husband states that this states symptoms started at 0900. Recent decrease in Lasix from 40 to 25 mg. CXR with no acute infiltrate. On arrival oxygen saturation 100%. BP 97/70. Afebrile, LA 1.3. Given 40 meq lasix with 825 ml output. BP decreased to 78/60. Given 500 ml bolus without improvement. PCCM consulted for evaluation.  On arrival to bedside patient alert and oriented. Fluid bolus still going. BP now 90/65. Triad asked to admit. Shortly after called back as patient was stated to be lethargic with BP 65/28, additional fluid bolus being administered without improvement. Plans to start Levophed and transfer to ICU. On arrival to ICU patient alert and oriented with BP 98/71 without pressors.   Intubated for increased work of breathing.  Subjective:    Precedex off. Denies pain. Weaning Levophed.    Spoke to husband who encapsulated her history as follows: was in normal state of health until [facility] 2005. 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  Increasing debility with multiple hospitalizations with worsening sacral decubitus. Had been healing until patient stopped eating due to difficulty swallowing over a year ago. Recent hospitalization at [facility] for removal of protruding hardware.   Returns  here for increasing dyspnea and hypotension.  Objective   Blood pressure 97/64, pulse (!) 111, temperature 98.5 F (36.9 C), temperature source Oral, resp. rate (!) 29, height 5\' 1"  (1.549 m), weight 55.3 kg, SpO2 98 %.    Vent Mode: PSV;CPAP FiO2 (%):  [40 %-80 %] 40 % Set Rate:  [16 bmp] 16 bmp Vt Set:  [380 mL] 380 mL PEEP:  [5 cmH20] 5 cmH20 Pressure Support:  [14 cmH20] 14 cmH20 Plateau Pressure:  [17 cmH20-27 cmH20] 17 cmH20   Intake/Output Summary (Last 24 hours) at 06/15/2018 1419 Last data filed at 06/15/2018 1200 Gross per 24 hour  Intake 2559.77 ml  Output 610 ml  Net 1949.77 ml   Filed Weights   06/26/2018 1255 06/14/18 0500 06/15/18 0430  Weight: 46.3 kg 41 kg 55.3 kg    Examination: General: chronically ill appearing female in NAD lying in bed HEENT: MM pink/moist, coating on lips, JVP flat, temporal wasting Neuro: opens eyes to voice, faint/raspy voice but speech clear, oriented, generalized weakness CV: s1s2 rrr, no m/r/g PULM: even/non-labored, lungs bilaterally diminished  WO:EHOZ, non-tender, bsx4 active  Extremities: warm/dry, 1-2+ generalized pitting edema.  Marked muscle wasting. Skin: no rashes or lesions.  No purulent drainage from wound VAC  Consults: date of consult/date signed off & final recs:   Procedures: Left PICC >>     Resolved Hospital Problem list     Assessment & Plan:   Acute on Chronic Hypotension Baseline Systolic 90 Resumed home midodrine. Will wean NE to MAP of 60 given borderline BP Likely related to malnutrition or persistent  capillary leak from low-grade infection.  Acute respiratory failure Currently tolerating minimal PRVC ventilator settings, but suspect component of neuromuscular weakness as cause of in respiratory failure. CT shows either pneumonia or simple atelectasis.  Few WBC on tracheal aspirate favors latter. Continue full support until NE weaned as may not have cardiovascular reserve to tolerate increased  WOB.  Recent Bacteremia with Staphylococcus capitis secondary to infected right hip hardware  -LA 1.0, PCT 0.13 -Does not appear to have new infection  P: Follow fever curve / WBC trend  Await cultures  Continue Daptomycin + Ertapenem, planned stop date 10/18  Stage 4 ulcer to sacrum - present on admit Shoulder Decubitus Ulcers - present on admit  Surgical wound to right thigh with VAC placement  P: WOC following, appreciate input  Patient was due for dressing change 9/15   Neurogenic Bladder with Chronic Foley  Hx Recurrent UTI  P: Continue foley catheter  Catheter care per protocol  Recent Diagnosis Right LE DVT  P: Continue Eliquis   Assess CT chest  Add chest PT  BID 3% NS neb x3 days  SLP evaluation for aspiration  Severe Protein-Calorie Malnutrition  At Risk Dysphagia / Aspiration  Suspect that this is at the core of her failure to thrive. P: On enteral nutrition.   Disposition / Summary of Today's Plan 06/15/18   Consider transfer to SDU.  BP remained stable / not on pressors.  Continue gentle IVF's for now.       DVT prophylaxis: Eliquis  GI prophylaxis: N/A  Diet: Regular  Mobility:Bedrest  Code Status: Full Code  Family Communication: Patient updated on plan of care 9/16 am.  Husband updated at bedside.  Given overall deconditioning, discussed concept of mechanical ventilation with patient while her husband was at bedside. She indicates she would want to have ventilator support if needed.    Labs and diagnostic studies   Increasing leukocytosis at 31.3 Stable anemia of chronic disease at 8.4 Mild hypokalemia. - replaced.  Blood and sputum cultures are negative. Few WBC on sputum Gram stain. MRSA PCR is negative.  CT chest (personally reviewed) - left retrocardiac consolidation consistent with either pneumonia or atelectasis.  CRITICAL CARE Performed by: Kipp Brood   Total critical care time: 45 minutes  Critical care time was exclusive of  separately billable procedures and treating other patients.  Critical care was necessary to treat or prevent imminent or life-threatening deterioration.  Critical care was time spent personally by me on the following activities: development of treatment plan with patient and/or surrogate as well as nursing, discussions with consultants, evaluation of patient's response to treatment, examination of patient, obtaining history from patient or surrogate, ordering and performing treatments and interventions, ordering and review of laboratory studies, ordering and review of radiographic studies, pulse oximetry and re-evaluation of patient's condition.   Kipp Brood, MD [facility] ICU Physician [place]  Pager: [phone] Mobile: [phone] After hours: [phone].

## 2018-06-15 NOTE — Telephone Encounter (Signed)
Noted that patient is in hospital.

## 2018-06-16 LAB — CBC WITH DIFFERENTIAL/PLATELET
Abs Immature Granulocytes: 0.2 10*3/uL — ABNORMAL HIGH (ref 0.0–0.1)
Basophils Absolute: 0.1 10*3/uL (ref 0.0–0.1)
Basophils Relative: 0 %
EOS ABS: 0 10*3/uL (ref 0.0–0.7)
EOS PCT: 0 %
HEMATOCRIT: 28.1 % — AB (ref 36.0–46.0)
HEMOGLOBIN: 7.9 g/dL — AB (ref 12.0–15.0)
Immature Granulocytes: 1 %
LYMPHS ABS: 3.4 10*3/uL (ref 0.7–4.0)
LYMPHS PCT: 17 %
MCH: 23.2 pg — AB (ref 26.0–34.0)
MCHC: 28.1 g/dL — ABNORMAL LOW (ref 30.0–36.0)
MCV: 82.4 fL (ref 78.0–100.0)
MONOS PCT: 5 %
Monocytes Absolute: 1 10*3/uL (ref 0.1–1.0)
Neutro Abs: 15.4 10*3/uL — ABNORMAL HIGH (ref 1.7–7.7)
Neutrophils Relative %: 77 %
PLATELETS: 229 10*3/uL (ref 150–400)
RBC: 3.41 MIL/uL — ABNORMAL LOW (ref 3.87–5.11)
RDW: 20.8 % — ABNORMAL HIGH (ref 11.5–15.5)
WBC: 20.1 10*3/uL — ABNORMAL HIGH (ref 4.0–10.5)

## 2018-06-16 LAB — GLUCOSE, CAPILLARY
GLUCOSE-CAPILLARY: 126 mg/dL — AB (ref 70–99)
GLUCOSE-CAPILLARY: 121 mg/dL — AB (ref 70–99)
Glucose-Capillary: 103 mg/dL — ABNORMAL HIGH (ref 70–99)
Glucose-Capillary: 108 mg/dL — ABNORMAL HIGH (ref 70–99)
Glucose-Capillary: 114 mg/dL — ABNORMAL HIGH (ref 70–99)
Glucose-Capillary: 97 mg/dL (ref 70–99)

## 2018-06-16 LAB — BASIC METABOLIC PANEL
Anion gap: 8 (ref 5–15)
Anion gap: 7 (ref 5–15)
BUN: 36 mg/dL — AB (ref 8–23)
BUN: 39 mg/dL — ABNORMAL HIGH (ref 8–23)
CALCIUM: 10.4 mg/dL — AB (ref 8.9–10.3)
CHLORIDE: 115 mmol/L — AB (ref 98–111)
CO2: 24 mmol/L (ref 22–32)
CO2: 24 mmol/L (ref 22–32)
CREATININE: 0.49 mg/dL (ref 0.44–1.00)
Calcium: 10.2 mg/dL (ref 8.9–10.3)
Chloride: 118 mmol/L — ABNORMAL HIGH (ref 98–111)
Creatinine, Ser: 0.5 mg/dL (ref 0.44–1.00)
GFR calc Af Amer: >60 mL/min (ref >60)
GFR calc non Af Amer: >60 mL/min (ref >60)
GFR calc non Af Amer: >60 mL/min (ref >60)
Glucose, Bld: 145 mg/dL — ABNORMAL HIGH (ref 70–99)
Glucose, Bld: 130 mg/dL — ABNORMAL HIGH (ref 70–99)
POTASSIUM: 4.4 mmol/L (ref 3.5–5.1)
Potassium: 3.2 mmol/L — ABNORMAL LOW (ref 3.5–5.1)
SODIUM: 147 mmol/L — AB (ref 135–145)
Sodium: 149 mmol/L — ABNORMAL HIGH (ref 135–145)

## 2018-06-16 MED ORDER — SODIUM CHLORIDE 0.9% FLUSH: 10.0000 mL | INTRAVENOUS | Status: DC | PRN

## 2018-06-16 MED ORDER — ONDANSETRON HCL 4 MG/2ML IJ SOLN
4.0000 mg | Freq: Four times a day (QID) | INTRAMUSCULAR | Status: DC | PRN
  Administered 2018-06-16: 4 mg via INTRAVENOUS

## 2018-06-16 MED ORDER — CHLORHEXIDINE GLUCONATE CLOTH 2 % EX PADS
6.0000 | MEDICATED_PAD | Freq: Every day | CUTANEOUS | Status: DC
  Administered 2018-06-17 – 2018-06-21 (×5): 6 via TOPICAL

## 2018-06-16 MED ORDER — FREE WATER
100.0000 mL | Freq: Three times a day (TID) | Status: DC
  Administered 2018-06-16 – 2018-06-17 (×3): 100 mL

## 2018-06-16 MED ORDER — MIDODRINE HCL 5 MG PO TABS
10.0000 mg | ORAL_TABLET | Freq: Three times a day (TID) | ORAL | Status: DC
  Administered 2018-06-16 – 2018-06-21 (×16): 10 mg
  Filled 2018-06-16 (×17): qty 2

## 2018-06-16 MED ORDER — POTASSIUM CHLORIDE 10 MEQ/100ML IV SOLN
10.0000 meq | INTRAVENOUS | Status: AC
  Administered 2018-06-16 (×4): 10 meq via INTRAVENOUS
  Filled 2018-06-16 (×4): qty 100

## 2018-06-16 MED ORDER — SODIUM CHLORIDE 0.9% FLUSH
10.0000 mL | Freq: Two times a day (BID) | INTRAVENOUS | Status: DC
  Administered 2018-06-16 – 2018-06-21 (×7): 10 mL

## 2018-06-16 MED ORDER — ONDANSETRON HCL 4 MG/2ML IJ SOLN
INTRAMUSCULAR | Status: AC
  Filled 2018-06-16: qty 2

## 2018-06-16 NOTE — Progress Notes (Signed)
SLP Cancellation Note  Patient Details Name: Sharon Warner MRN: [id number] DOB: July 13, 1951   Cancelled treatment:       Reason Eval/Treat Not Completed: Medical issues which prohibited therapy. Pt remains intubated. SLP to sign off - please reorder when ready.    Germain Osgood 06/16/2018, 8:15 AM  Germain Osgood, M.A. [place] Acute Environmental education officer [phone] Office [phone]

## 2018-06-16 NOTE — Progress Notes (Signed)
[facility] ADULT ICU REPLACEMENT PROTOCOL FOR AM LAB REPLACEMENT ONLY  The patient does apply for the [facility] Adult ICU Electrolyte Replacment Protocol based on the criteria listed below:   1. Is GFR >/= 40 ml/min? Yes.    Patient's GFR today is>60 2. Is urine output >/= 0.5 ml/kg/hr for the last 6 hours? Yes.   Patient's UOP is 0.7 ml/kg/hr 3. Is BUN < 60 mg/dL? Yes.    Patient's BUN today i36 4. Abnormal electrolyte(s): Kt=3.2 5. Ordered repletion with: per protocol 6. If a panic level lab has been reported, has the CCM MD in charge been notified? Yes.  .   Physician:  Billy Coast MD  Vilma Prader 06/16/2018 7:24 AM

## 2018-06-16 NOTE — Progress Notes (Signed)
Sharon Warner  HUD:[id number] DOB: Mar 17, 1951 DOA: 06/27/2018 PCP: [name], MD    LOS: 3 days   Reason for Consult / Chief Complaint:  Hypotension   Consulting MD and date:  [name] - TRH. 9/14  HPI/Summary of hospital stay:  67 year old female with PMH Paraplegia, Systolic HF (EF 85-88), Stage 4 Ulcer to Sacrum, Right Thigh wound VAC secondary to removal of surgical hardware, Chronic PE, Newly diagnosis DVT to Right LE on Eliquis, Chronic Hypotension (Systolic Baseline 90), OSA not on CPAP    Recent Admission to [facility] with Bacteremia with Staphyloccous capitis secondary to infected hip hardware on Daptomycin and Ertapenem.  Presents to ED 9/14 with acute dyspnea. Husband states that this states symptoms started at 0900. Recent decrease in Lasix from 40 to 25 mg. CXR with no acute infiltrate. On arrival oxygen saturation 100%. BP 97/70. Afebrile, LA 1.3. Given 40 meq lasix with 825 ml output. BP decreased to 78/60. Given 500 ml bolus without improvement. PCCM consulted for evaluation.  On arrival to bedside patient alert and oriented. Fluid bolus still going. BP now 90/65. [facility]ad asked to admit. Shortly after called back as patient was stated to be lethargic with BP 65/28, additional fluid bolus being administered without improvement. Plans to start Levophed and transfer to ICU. On arrival to ICU patient alert and oriented with BP 98/71 without pressors.   Intubated for increased work of breathing.  Spoke to husband who encapsulated her history as follows: was in normal state of health until [facility] 2005. 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  Increasing debility with multiple hospitalizations with worsening sacral decubitus. Had been healing until patient stopped eating due to difficulty swallowing over a year ago. Recent hospitalization at [facility] for removal of protruding hardware.   Subjective:    Levophed decreased. Denies pain. On pressure support  10/5.  Objective   Blood pressure 92/61, pulse (!) 108, temperature 99 F (37.2 C), temperature source Oral, resp. rate (!) 27, height 5\' 1"  (1.549 m), weight 55.7 kg, SpO2 98 %.    Vent Mode: PSV;CPAP FiO2 (%):  [40 %] 40 % Set Rate:  [16 bmp] 16 bmp Vt Set:  [380 mL] 380 mL PEEP:  [5 cmH20] 5 cmH20 Pressure Support:  [10 cmH20-14 cmH20] 10 cmH20 Plateau Pressure:  [13 cmH20-19 cmH20] 13 cmH20   Intake/Output Summary (Last 24 hours) at 06/16/2018 1031 Last data filed at 06/16/2018 1000 Gross per 24 hour  Intake 3036.07 ml  Output 2012 ml  Net 1024.07 ml   Filed Weights   06/14/18 0500 06/15/18 0430 06/16/18 0415  Weight: 41 kg 55.3 kg 55.7 kg    Examination: General: chronically ill appearing female in NAD lying in bed HEENT: MM pink/moist, coating on lips, JVP flat, temporal wasting Neuro: opens eyes to voice, faint/raspy voice but speech clear, oriented, generalized weakness CV: s1s2 rrr, no m/r/g. Extremities warm. PULM: even/non-labored, lungs bilaterally diminished.  Tolerating PSV 10/5 but SBI>105 on 5/5 FO:YDXA, non-tender, bsx4 active  Extremities: warm/dry, 1-2+ generalized pitting edema.  Marked muscle wasting. Skin: no rashes or lesions.  No purulent drainage from wound VAC  Consults: date of consult/date signed off & final recs:   Procedures: Left PICC >>   Resolved Hospital Problem list     Assessment & Plan:   Acute on Chronic Hypotension Baseline Systolic 90 Increase home midodrine. Will wean NE to MAP of 60 given borderline BP at baseline. Likely related to malnutrition or  persistent capillary leak from low-grade infection.  Acute respiratory failure Currently tolerating PSV trials, but suspect component of neuromuscular weakness as cause of in respiratory failure. CT shows either pneumonia or simple atelectasis.  Few WBC on tracheal aspirate favors latter. Daily SBT 5/5 and begin mobilization.  Recent Bacteremia with Staphylococcus capitis  secondary to infected right hip hardware  -LA 1.0, PCT 0.13 -Does not appear to have new infection  Follow fever curve / WBC trend  Await cultures  Continue Daptomycin  planned stop date 10/18 Stop meropenem as negative cultures.  Stage 4 ulcer to sacrum - present on admit Shoulder Decubitus Ulcers - present on admit  Surgical wound to right thigh with VAC placement  P: WOC following, appreciate input  Patient was due for dressing change 9/15   Neurogenic Bladder with Chronic Foley  Hx Recurrent UTI  P: Continue foley catheter  Catheter care per protocol  Recent Diagnosis Right LE DVT  P: Continue Eliquis   Assess CT chest  Add chest PT  BID 3% NS neb x3 days  SLP evaluation for aspiration  Severe Protein-Calorie Malnutrition  At Risk Dysphagia / Aspiration Suspect that this is at the core of her failure to thrive. P: On enteral nutrition.   Hypernatremia from high insensitive losses.  Will increase fee water.  Disposition / Summary of Today's Plan 06/16/18   Continue to wean pressors    DVT prophylaxis: Eliquis  GI prophylaxis: N/A  Diet: severe nutritional risk. On tube feeds at goal. Mobility: up to chair. Code Status: Full Code  Family Communication: no family at bedside today.  Labs and diagnostic studies   Improving leukocytosis at 20.1 Stable anemia of chronic disease at 8.4 Mild hypokalemia. - replaced. Mild hypernatremia at 147  Blood and sputum cultures are negative. Few WBC on sputum Gram stain. MRSA PCR is negative.  CT chest (personally reviewed) - left retrocardiac consolidation consistent with either pneumonia or atelectasis.  CRITICAL CARE Performed by: Kipp Brood   Total critical care time: 40 minutes  Critical care time was exclusive of separately billable procedures and treating other patients.  Critical care was necessary to treat or prevent imminent or life-threatening deterioration.  Critical care was time spent  personally by me on the following activities: development of treatment plan with patient and/or surrogate as well as nursing, discussions with consultants, evaluation of patient's response to treatment, examination of patient, obtaining history from patient or surrogate, ordering and performing treatments and interventions, ordering and review of laboratory studies, ordering and review of radiographic studies, pulse oximetry and re-evaluation of patient's condition.   Kipp Brood, MD [facility] ICU Physician [place]  Pager: [phone] Mobile: [phone] After hours: [phone].

## 2018-06-16 NOTE — Progress Notes (Signed)
Pharmacy Antibiotic Note  Sharon Warner is a 67 y.o. female admitted on 06/19/2018 with sepsis.    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   Patient is on day #3 daptomycin and meropenem. Her respiratory culture is growing GNR, all other cultures are negative at this point.  Plan: -meropenem 1 g IV q12h -daptomycin 375 mg IV q24h -Monitor renal fx, cultures, CK qWeek   Height: 5\' 1"  (154.9 cm) Weight: 122 lb 12.7 oz (55.7 kg) IBW/kg (Calculated) : 47.8  Temp (24hrs), Avg:98.9 F (37.2 C), Min:98.1 F (36.7 C), Max:99.8 F (37.7 C)  Recent Labs  Lab 06/06/2018 1402 06/14/18 0047 06/14/18 0321 06/15/18 0436 06/16/18 0358  WBC 9.6  --  12.1* 31.3* 20.1*  CREATININE 0.39*  --  0.34* 0.49 0.49  LATICACIDVEN 1.33 1.0  --   --   --      Antimicrobials this admission: 9/14 cefepime x1 9/15 meropenem >> 9/15 ertapenem x1 9/15 daptomycin >>  Dose adjustments this admission: N/A  Microbiology results: 9/14 BCx: ngtd 9/14 UCx: multiple species 9/15 RVP: neg 9/15 MRSA pcr: neg 9/16 resp cx: GNR   MastersJake Church 06/16/2018 10:47 AM

## 2018-06-16 NOTE — Care Management (Signed)
06/16/18 CM discussed pt with attending, pt may be LTACH appropriate at some pt.  Currently pt remains ventilated by way of ETT tube.  Both [facility] and [facility] can offer beds once appropriate.  CM discussed options with husband and he is in agreement with LTACH when appropriate  06/15/18 Pt has had multiple admits at [place], [place] pt active with [facility]. Husband is pts sole caretaker in the home.  PCM received call from [facility] liaison - PTA pt was active with agency in the home for [facility].   Pts care in the home is very complex including; stage 4 wounds requiring wound vac, IV antibiotics.  Per [facility] pt will not be accepted back at discharge due to her needs and required services no longer meet home heath parameters (pt requires 2 HHRN each trip that spend approximately 4 hours every session, husband is not readily available when needed).  CM provided potential referral to [facility].

## 2018-06-16 NOTE — Progress Notes (Signed)
eLink Physician-Brief Progress Note Patient Name: Sharon Warner DOB: 07-Aug-1951 MRN: [id number]   Date of Service  06/16/2018  HPI/Events of Note  Nausea - QTc interval = 370 milliseconds.   eICU Interventions  Will order: 1. Zofran 4 mg IV Q 6 hour PRN N/V.     Intervention Category Major Interventions: Other:  Lysle Dingwall 06/16/2018, 6:17 AM

## 2018-06-16 NOTE — Progress Notes (Signed)
Pt placed back on full vent support by MD due to no plans to extubate today.

## 2018-06-17 ENCOUNTER — Inpatient Hospital Stay (HOSPITAL_COMMUNITY): Payer: Medicare Other

## 2018-06-17 DIAGNOSIS — I330 Acute and subacute infective endocarditis: Secondary | ICD-10-CM

## 2018-06-17 DIAGNOSIS — Z9911 Dependence on respirator [ventilator] status: Secondary | ICD-10-CM

## 2018-06-17 DIAGNOSIS — Z87828 Personal history of other (healed) physical injury and trauma: Secondary | ICD-10-CM

## 2018-06-17 DIAGNOSIS — Z86718 Personal history of other venous thrombosis and embolism: Secondary | ICD-10-CM

## 2018-06-17 DIAGNOSIS — Z8619 Personal history of other infectious and parasitic diseases: Secondary | ICD-10-CM

## 2018-06-17 DIAGNOSIS — G8220 Paraplegia, unspecified: Secondary | ICD-10-CM

## 2018-06-17 DIAGNOSIS — I712 Thoracic aortic aneurysm, without rupture: Secondary | ICD-10-CM

## 2018-06-17 DIAGNOSIS — A420 Pulmonary actinomycosis: Secondary | ICD-10-CM

## 2018-06-17 DIAGNOSIS — Z1621 Resistance to vancomycin: Secondary | ICD-10-CM

## 2018-06-17 DIAGNOSIS — Z95828 Presence of other vascular implants and grafts: Secondary | ICD-10-CM

## 2018-06-17 LAB — GLUCOSE, CAPILLARY
GLUCOSE-CAPILLARY: 112 mg/dL — AB (ref 70–99)
Glucose-Capillary: 103 mg/dL — ABNORMAL HIGH (ref 70–99)
Glucose-Capillary: 115 mg/dL — ABNORMAL HIGH (ref 70–99)
Glucose-Capillary: 111 mg/dL — ABNORMAL HIGH (ref 70–99)
Glucose-Capillary: 111 mg/dL — ABNORMAL HIGH (ref 70–99)
Glucose-Capillary: 102 mg/dL — ABNORMAL HIGH (ref 70–99)

## 2018-06-17 LAB — BASIC METABOLIC PANEL
Anion gap: 8 (ref 5–15)
BUN: 44 mg/dL — ABNORMAL HIGH (ref 8–23)
CALCIUM: 10.4 mg/dL — AB (ref 8.9–10.3)
CO2: 24 mmol/L (ref 22–32)
CREATININE: 0.46 mg/dL (ref 0.44–1.00)
Chloride: 118 mmol/L — ABNORMAL HIGH (ref 98–111)
GFR calc Af Amer: >60 mL/min (ref >60)
Glucose, Bld: 113 mg/dL — ABNORMAL HIGH (ref 70–99)
Potassium: 3.9 mmol/L (ref 3.5–5.1)
SODIUM: 150 mmol/L — AB (ref 135–145)

## 2018-06-17 LAB — CBC WITH DIFFERENTIAL/PLATELET
Abs Immature Granulocytes: 0.1 10*3/uL (ref 0.0–0.1)
BASOS ABS: 0 10*3/uL (ref 0.0–0.1)
Basophils Relative: 0 %
EOS ABS: 0.1 10*3/uL (ref 0.0–0.7)
EOS PCT: 0 %
HCT: 28.9 % — ABNORMAL LOW (ref 36.0–46.0)
Hemoglobin: 8 g/dL — ABNORMAL LOW (ref 12.0–15.0)
Immature Granulocytes: 1 %
Lymphocytes Relative: 30 %
Lymphs Abs: 4.3 10*3/uL — ABNORMAL HIGH (ref 0.7–4.0)
MCH: 23.2 pg — ABNORMAL LOW (ref 26.0–34.0)
MCHC: 27.7 g/dL — AB (ref 30.0–36.0)
MCV: 83.8 fL (ref 78.0–100.0)
Monocytes Absolute: 0.8 10*3/uL (ref 0.1–1.0)
Monocytes Relative: 6 %
Neutro Abs: 9.3 10*3/uL — ABNORMAL HIGH (ref 1.7–7.7)
Neutrophils Relative %: 63 %
Platelets: 252 10*3/uL (ref 150–400)
RBC: 3.45 MIL/uL — AB (ref 3.87–5.11)
RDW: 20.7 % — AB (ref 11.5–15.5)
WBC: 14.6 10*3/uL — AB (ref 4.0–10.5)

## 2018-06-17 LAB — PHOSPHORUS: Phosphorus: 2.7 mg/dL (ref 2.5–4.6)

## 2018-06-17 LAB — CK: Total CK: 50 U/L (ref 38–234)

## 2018-06-17 MED ORDER — COLISTIMETHATE NEBULIZED SOLUTION
150.0000 mg | Freq: Two times a day (BID) | INTRAMUSCULAR | Status: DC
  Administered 2018-06-17 – 2018-06-21 (×8): 150 mg via RESPIRATORY_TRACT
  Filled 2018-06-17 (×9): qty 2

## 2018-06-17 MED ORDER — COLISTIMETHATE SODIUM 150 MG IJ SOLR
150.0000 mg | Freq: Two times a day (BID) | INTRAMUSCULAR | Status: DC
  Filled 2018-06-17: qty 2

## 2018-06-17 MED ORDER — SODIUM CHLORIDE 0.9 % IV SOLN
1.0000 mg/kg | Freq: Once | INTRAVENOUS | Status: AC
  Administered 2018-06-17: 56 mg via INTRAVENOUS
  Filled 2018-06-17: qty 5.6

## 2018-06-17 MED ORDER — SODIUM CHLORIDE 0.9 % IV SOLN
1.0000 mg/kg | Freq: Two times a day (BID) | INTRAVENOUS | Status: DC
  Filled 2018-06-17 (×3): qty 5.6

## 2018-06-17 MED ORDER — SODIUM CHLORIDE 0.9 % IV SOLN
50.0000 mg | Freq: Two times a day (BID) | INTRAVENOUS | Status: DC
  Administered 2018-06-18 – 2018-06-21 (×7): 50 mg via INTRAVENOUS
  Filled 2018-06-17 (×9): qty 5

## 2018-06-17 MED ORDER — SODIUM CHLORIDE 0.9 % IV SOLN
50.0000 mg | Freq: Two times a day (BID) | INTRAVENOUS | Status: DC
  Filled 2018-06-17: qty 5

## 2018-06-17 MED ORDER — FREE WATER
200.0000 mL | Freq: Three times a day (TID) | Status: DC
  Administered 2018-06-17 – 2018-06-19 (×6): 200 mL

## 2018-06-17 MED ORDER — COLISTIMETHATE NEBULIZED SOLUTION: 150.0000 mg | Freq: Two times a day (BID) | INTRAMUSCULAR | Status: DC

## 2018-06-17 NOTE — Progress Notes (Signed)
Nutrition Follow-up  DOCUMENTATION CODES:   Severe malnutrition in context of chronic illness  INTERVENTION:    Continue Vital AF 1.2 at 40 ml/h with Pro-stat 30 ml BID to meet nutrition goals  Continue Juven 1 packet mixed with 8 oz water BID via tube; supplement provides CaHMB, glutamine, arginine, collagen protein, and micronutrients to promote wound healing.  NUTRITION DIAGNOSIS:   Severe Malnutrition related to chronic illness(CHF, paraplegia) as evidenced by severe muscle depletion, percent weight loss(9% weight loss within 3 months).  Ongoing  GOAL:   Patient will meet greater than or equal to 90% of their needs  Met with TF  MONITOR:   Vent status, TF tolerance, Labs, Skin  ASSESSMENT:   67 yo female with PMH of HTN, paraplegia, chronic Foley catheter, stage 4 sacral ulcer, DM, and CHF who was admitted on 9/14 with dyspnea r/t fluid overload. Required intubation 9/15.  Discussed patient with RN today. Tolerating TF well. Vital AF 1.2 at 40 ml/h with pro-stat 30 ml BID providing 1352 kcal, 102 gm protein, 779 ml free water daily. Juven 1 packet BID.  Patient is currently intubated on ventilator support MV: 8 L/min Temp (24hrs), Avg:98.4 F (36.9 C), Min:97.6 F (36.4 C), Max:99.3 F (37.4 C)   Labs reviewed. Sodium 150 (H) Free water 200 ml every 8 hours added today. CBG's: [id number] Medications reviewed.    Diet Order:   Diet Order            Diet NPO time specified  Diet effective now              EDUCATION NEEDS:   Education needs have been addressed(with husband)  Skin:  Skin Assessment: Skin Integrity Issues: Skin Integrity Issues:: Stage II, Stage III, Unstageable Stage II: shoulder Stage III: thigh (wound VAC) Unstageable: sacrum  Last BM:  775 ml via colostomy 9/17  Height:   Ht Readings from Last 1 Encounters:  06/15/2018 '5\' 1"'  (1.549 m)    Weight:   Wt Readings from Last 1 Encounters:  06/17/18 56.3 kg    Ideal Body  Weight:  47.7 kg  BMI:  Body mass index is 23.45 kg/m.  Estimated Nutritional Needs:   Kcal:  1300  Protein:  95-110 gm  Fluid:  1.5 L    Molli Barrows, RD, LDN, [place] Pager [phone] After Hours Pager [phone]

## 2018-06-17 NOTE — Progress Notes (Signed)
Sharon Warner  YTK:[id number] DOB: 1950-11-20 DOA: 06/14/2018 PCP: [name], MD    LOS: 4 days   Reason for Consult / Chief Complaint:  Hypotension   Consulting MD and date:  [name] - TRH. 9/14  HPI/Summary of hospital stay:  67 year old female with PMH Paraplegia, Systolic HF (EF 75-17), Stage 4 Ulcer to Sacrum, Right Thigh wound VAC secondary to removal of surgical hardware, Chronic PE, Newly diagnosis DVT to Right LE on Eliquis, Chronic Hypotension (Systolic Baseline 90), OSA not on CPAP    Recent Admission to [facility] with Bacteremia with Staphyloccous capitis secondary to infected hip hardware on Daptomycin and Ertapenem.  Presents to ED 9/14 with acute dyspnea. Husband states that this states symptoms started at 0900. Recent decrease in Lasix from 40 to 25 mg. CXR with no acute infiltrate. On arrival oxygen saturation 100%. BP 97/70. Afebrile, LA 1.3. Given 40 meq lasix with 825 ml output. BP decreased to 78/60. Given 500 ml bolus without improvement. PCCM consulted for evaluation.  On arrival to bedside patient alert and oriented. Fluid bolus still going. BP now 90/65. [facility]ad asked to admit. Shortly after called back as patient was stated to be lethargic with BP 65/28, additional fluid bolus being administered without improvement. Plans to start Levophed and transfer to ICU. On arrival to ICU patient alert and oriented with BP 98/71 without pressors.   Intubated for increased work of breathing.  Spoke to husband who encapsulated her history as follows: was in normal state of health until [facility] 2005. 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  Increasing debility with multiple hospitalizations with worsening sacral decubitus. Had been healing until patient stopped eating due to difficulty swallowing over a year ago. Recent hospitalization at [facility] for removal of protruding hardware.   Subjective:    Levophed continues at 8. Denies pain. On pressure support  12/5.  Objective   Blood pressure (!) 81/57, pulse 83, temperature 98.4 F (36.9 C), temperature source Oral, resp. rate 19, height 5\' 1"  (1.549 m), weight 56.3 kg, SpO2 98 %.    Vent Mode: PSV;CPAP FiO2 (%):  [40 %] 40 % Set Rate:  [16 bmp] 16 bmp Vt Set:  [380 mL] 380 mL PEEP:  [5 cmH20] 5 cmH20 Pressure Support:  [12 cmH20] 12 cmH20 Plateau Pressure:  [18 cmH20-20 cmH20] 19 cmH20   Intake/Output Summary (Last 24 hours) at 06/17/2018 1120 Last data filed at 06/17/2018 1000 Gross per 24 hour  Intake 1885.67 ml  Output 2225 ml  Net -339.33 ml   Filed Weights   06/15/18 0430 06/16/18 0415 06/17/18 0431  Weight: 55.3 kg 55.7 kg 56.3 kg    Examination: General: chronically ill appearing female in NAD lying in bed HEENT: MM pink/moist, coating on lips, JVP flat, temporal wasting Neuro: opens eyes to voice, faint/raspy voice but speech clear, oriented, generalized weakness CV: s1s2 rrr, no m/r/g. Extremities warm. PULM: even/non-labored, lungs bilaterally diminished.  Tolerating PSV 10/5 but SBI>105 on 5/5 GY:FVCB, non-tender, bsx4 active  Extremities: warm/dry, 1-2+ generalized pitting edema.  Marked muscle wasting. Skin: no rashes or lesions.  No purulent drainage from wound VAC  Consults: date of consult/date signed off & final recs:   Procedures: Left PICC >>   Resolved Hospital Problem list     Assessment & Plan:   Acute on Chronic Hypotension Baseline Systolic 90 Increase home midodrine. Will wean NE to MAP of 60 given borderline BP at baseline. Likely related to malnutrition or  persistent capillary leak from low-grade infection.  Acute respiratory failure Currently not tolerating SBT trials, but suspect component of neuromuscular weakness as cause of in respiratory failure. CT shows either pneumonia or simple atelectasis.  Will treat for Acinetobacter pneumonia. Daily SBT and begin mobilization.  Recent Bacteremia with Staphylococcus capitis secondary to  infected right hip hardware  Continue Daptomycin  planned stop date 10/18.   Stage 4 ulcer to sacrum - present on admit Shoulder Decubitus Ulcers - present on admit  Surgical wound to right thigh with VAC placement  P: WOC following, appreciate input  Patient was due for dressing change 9/15   Neurogenic Bladder with Chronic Foley  Hx Recurrent UTI  P: Continue foley catheter  Catheter care per protocol  Recent Diagnosis Right LE DVT  P: Continue Eliquis    Severe Protein-Calorie Malnutrition  At Risk Dysphagia / Aspiration Suspect that this is at the core of her failure to thrive. P: On enteral nutrition.   Hypernatremia from high insensitive losses.  Will increase fee water further.   Disposition / Summary of Today's Plan 06/17/18   Continue to wean pressors. Feeding and daily SBT.    DVT prophylaxis: Eliquis  GI prophylaxis: N/A  Diet: severe nutritional risk. On tube feeds at goal. Mobility: up to chair. Code Status: Full Code  Family Communication: no family at bedside today.  Labs and diagnostic studies   Improving leukocytosis at 20.1 Stable anemia of chronic disease at 8.4 Mild hypokalemia. - replaced. Mild hypernatremia at 150 - increasing. Blood  cultures are negative. Few WBC on sputum Gram stain. Sputum positive for pan-resistant A baumanii MRSA PCR is negative.  CT chest (personally reviewed) - left retrocardiac consolidation consistent with either pneumonia or atelectasis.  CRITICAL CARE Performed by: Kipp Brood   Total critical care time: 40 minutes  Critical care time was exclusive of separately billable procedures and treating other patients.  Critical care was necessary to treat or prevent imminent or life-threatening deterioration.  Critical care was time spent personally by me on the following activities: development of treatment plan with patient and/or surrogate as well as nursing, discussions with consultants, evaluation of  patient's response to treatment, examination of patient, obtaining history from patient or surrogate, ordering and performing treatments and interventions, ordering and review of laboratory studies, ordering and review of radiographic studies, pulse oximetry and re-evaluation of patient's condition.   Kipp Brood, MD [facility] ICU Physician [place]  Pager: [phone] Mobile: [phone] After hours: [phone].
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
## 2018-06-17 NOTE — Evaluation (Signed)
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 Intubated for increased work of breathing secondary to acute hypotension.   Clinical Impression  Pt's husband reports that in July pt was able to be lifted to her power chair and tolerate 4-5 hours at a museum, since then she has been in and out of the hospital. Pt is currently ventilated,  FiO2 40%, PEEP 5. At beginning of Evaluation  SaO2 was 100%O2, and HR was 85bpm. With PROM of LUE/LE conducted for Evaluation pt SaO2 dropped to 76%O2, and her HR increased to 110 bpm and pt had c/o not being able to breathe. RN notified. Within 3 minutes pt SaO2 had rebounded to 100%O2. Pt husband educated that even PROM is currently too much for his wife to tolerate. Based on limited evaluation PT recommends LTACH level care when medically appropriate. PT will return to complete more thorough evaluation as pt can tolerate it.      Follow Up Recommendations LTACH    Equipment Recommendations  None recommended by PT    Recommendations for Other Services       Precautions / Restrictions Precautions Precautions: Fall Restrictions Weight Bearing Restrictions: No                Pertinent Vitals/Pain Pain Assessment: Faces Faces Pain Scale: Hurts a little bit Pain Location: UE with movement     Home Living Family/patient expects to be discharged to:: Private residence Living Arrangements: Spouse/significant other Available Help at Discharge: Available 24 hours/day;Family Type of Home: Apartment Home Access: Elevator     Home Layout: One  level Home Equipment: Wheelchair - power;Hospital bed;Other (comment)(Hoyer lift)      Prior Function Level of Independence: Needs assistance   Gait / Transfers Assistance Needed: lift to power chair, July was able to go to [place] for 4-5 hours  ADL's / Homemaking Assistance Needed: requires assist for all ADLs           Extremity/Trunk Assessment   Upper Extremity Assessment Upper Extremity Assessment: LUE deficits/detail LUE Deficits / Details: P/AAROM limited, strenght grossly assessed at 2/5    Lower Extremity Assessment Lower Extremity Assessment: LLE deficits/detail LLE Deficits / Details: Parapelgic, PROM limited, ankle lacks dorsiflexion, knee lacks full flex/ext  LLE Sensation: decreased light touch;decreased proprioception LLE Coordination: decreased fine motor;decreased gross motor       Communication   Communication: Tracheostomy;Other (comment)(on ventilator)  Cognition Arousal/Alertness: Awake/alert Behavior During Therapy: WFL for tasks assessed/performed Overall Cognitive Status: Difficult to assess                                        General Comments General comments (skin integrity, edema, etc.): Pt on ventilator, FiO2 40%, PEEP 5, SaO2 dropped to 76%O2 with PROM of ankle and knee, HR increased to 110 bpm, pt with c/o not getting enough air, RN notifed, pt recovered to 100%O2 saturation after 3 minutes         Assessment/Plan    PT Assessment Patient needs continued PT services  PT  Problem List Decreased strength;Decreased range of motion;Decreased activity tolerance;Decreased mobility;Cardiopulmonary status limiting activity;Impaired sensation       PT Treatment Interventions Therapeutic activities;Therapeutic exercise;Patient/family education    PT Goals (Current goals can be found in the Care Plan section)  Acute Rehab PT Goals Patient Stated Goal: none stated PT Goal Formulation: With patient/family Time For Goal  Achievement: 07/01/18    Frequency Min 2X/week    AM-PAC PT "6 Clicks" Daily Activity  Outcome Measure Difficulty turning over in bed (including adjusting bedclothes, sheets and blankets)?: Unable Difficulty moving from lying on back to sitting on the side of the bed? : Unable Difficulty sitting down on and standing up from a chair with arms (e.g., wheelchair, bedside commode, etc,.)?: Unable Help needed moving to and from a bed to chair (including a wheelchair)?: Total Help needed walking in hospital room?: Total Help needed climbing 3-5 steps with a railing? : Total 6 Click Score: 6    End of Session Equipment Utilized During Treatment: Oxygen Activity Tolerance: Treatment limited secondary to medical complications (Comment) Patient left: in bed;with call bell/phone within reach;with family/visitor present Nurse Communication: Other (comment)(O2 desaturation) PT Visit Diagnosis: Dizziness and giddiness (R42);Other symptoms and signs involving the nervous system (R29.898);Muscle weakness (generalized) (M62.81)    Time: 1410-1430 PT Time Calculation (min) (ACUTE ONLY): 20 min   Charges:   PT Evaluation $PT Eval High Complexity: 1 High          Sharon Warner PT, DPT Acute Rehabilitation Services Pager [phone] Office [phone]   [place] 06/17/2018, 3:01 PM

## 2018-06-18 DIAGNOSIS — Z1630 Resistance to unspecified antimicrobial drugs: Secondary | ICD-10-CM

## 2018-06-18 DIAGNOSIS — I719 Aortic aneurysm of unspecified site, without rupture: Secondary | ICD-10-CM

## 2018-06-18 DIAGNOSIS — B9689 Other specified bacterial agents as the cause of diseases classified elsewhere: Secondary | ICD-10-CM

## 2018-06-18 DIAGNOSIS — B957 Other staphylococcus as the cause of diseases classified elsewhere: Secondary | ICD-10-CM

## 2018-06-18 DIAGNOSIS — T8451XD Infection and inflammatory reaction due to internal right hip prosthesis, subsequent encounter: Secondary | ICD-10-CM

## 2018-06-18 LAB — CBC WITH DIFFERENTIAL/PLATELET
Abs Immature Granulocytes: 0.1 10*3/uL (ref 0.0–0.1)
BASOS ABS: 0 10*3/uL (ref 0.0–0.1)
BASOS PCT: 0 %
EOS ABS: 0 10*3/uL (ref 0.0–0.7)
Eosinophils Relative: 0 %
HCT: 30 % — ABNORMAL LOW (ref 36.0–46.0)
Hemoglobin: 8.2 g/dL — ABNORMAL LOW (ref 12.0–15.0)
Immature Granulocytes: 1 %
Lymphocytes Relative: 38 %
Lymphs Abs: 4.5 10*3/uL — ABNORMAL HIGH (ref 0.7–4.0)
MCH: 23.2 pg — AB (ref 26.0–34.0)
MCHC: 27.3 g/dL — AB (ref 30.0–36.0)
MCV: 85 fL (ref 78.0–100.0)
MONO ABS: 1 10*3/uL (ref 0.1–1.0)
Monocytes Relative: 8 %
Neutro Abs: 6.2 10*3/uL (ref 1.7–7.7)
Neutrophils Relative %: 53 %
Platelets: 233 10*3/uL (ref 150–400)
RBC: 3.53 MIL/uL — ABNORMAL LOW (ref 3.87–5.11)
RDW: 20.5 % — AB (ref 11.5–15.5)
WBC: 11.9 10*3/uL — AB (ref 4.0–10.5)

## 2018-06-18 LAB — GLUCOSE, CAPILLARY
GLUCOSE-CAPILLARY: 101 mg/dL — AB (ref 70–99)
GLUCOSE-CAPILLARY: 113 mg/dL — AB (ref 70–99)
GLUCOSE-CAPILLARY: 170 mg/dL — AB (ref 70–99)
Glucose-Capillary: 114 mg/dL — ABNORMAL HIGH (ref 70–99)
Glucose-Capillary: 138 mg/dL — ABNORMAL HIGH (ref 70–99)
Glucose-Capillary: 181 mg/dL — ABNORMAL HIGH (ref 70–99)

## 2018-06-18 LAB — BASIC METABOLIC PANEL
Anion gap: 4 — ABNORMAL LOW (ref 5–15)
BUN: 52 mg/dL — ABNORMAL HIGH (ref 8–23)
CHLORIDE: 122 mmol/L — AB (ref 98–111)
CO2: 26 mmol/L (ref 22–32)
CREATININE: 0.52 mg/dL (ref 0.44–1.00)
Calcium: 10.3 mg/dL (ref 8.9–10.3)
GFR calc non Af Amer: >60 mL/min (ref >60)
GLUCOSE: 119 mg/dL — AB (ref 70–99)
Potassium: 3.7 mmol/L (ref 3.5–5.1)
Sodium: 152 mmol/L — ABNORMAL HIGH (ref 135–145)

## 2018-06-18 LAB — CULTURE, BLOOD (ROUTINE X 2)
Culture: NO GROWTH
Culture: NO GROWTH
SPECIAL REQUESTS: ADEQUATE

## 2018-06-18 MED ORDER — VITAL AF 1.2 CAL PO LIQD
1500.0000 mL | ORAL | Status: DC
  Filled 2018-06-18 (×2): qty 1500

## 2018-06-18 MED ORDER — FLUDROCORTISONE ACETATE 0.1 MG PO TABS
0.1000 mg | ORAL_TABLET | Freq: Every day | ORAL | Status: DC
  Administered 2018-06-18 – 2018-06-21 (×4): 0.1 mg
  Filled 2018-06-18 (×4): qty 1

## 2018-06-18 MED ORDER — HYDROCORTISONE NA SUCCINATE PF 100 MG IJ SOLR
50.0000 mg | Freq: Four times a day (QID) | INTRAMUSCULAR | Status: DC
  Administered 2018-06-18 – 2018-06-21 (×13): 50 mg via INTRAVENOUS
  Filled 2018-06-18 (×14): qty 2

## 2018-06-18 MED ORDER — ACETAMINOPHEN 160 MG/5ML PO SOLN
650.0000 mg | Freq: Four times a day (QID) | ORAL | Status: DC | PRN
  Administered 2018-06-18: 650 mg
  Filled 2018-06-18: qty 20.3

## 2018-06-18 MED ORDER — VITAL AF 1.2 CAL PO LIQD
1500.0000 mL | ORAL | Status: DC
  Administered 2018-06-18 – 2018-06-20 (×3): 1500 mL
  Filled 2018-06-18 (×5): qty 1500

## 2018-06-18 NOTE — Progress Notes (Signed)
INFECTIOUS DISEASE PROGRESS NOTE  ID: Sharon Warner is a 67 y.o. female with  Active Problems:   Type 2 diabetes mellitus ([place])   Obstructive sleep apnea   Protein-calorie malnutrition ([place])   Sacral decubitus ulcer, stage IV (HCC)   Catheter-associated urinary tract infection (HCC)   Hypotension (arterial)   Chronic systolic CHF (congestive heart failure) (HCC)   Acute respiratory failure with hypoxia (HCC)   Open wound of right hip   Hypotension  Subjective: Awake and interacts.   Abtx:  Anti-infectives (From admission, onward)   Start     Dose/Rate Route Frequency Ordered Stop   06/18/18 0500  eravacycline (XERAVA) 50 mg in sodium chloride 0.9 % 150 mL IVPB     50 mg 150 mL/hr over 60 Minutes Intravenous Every 12 hours 06/17/18 1617     06/17/18 2200  colistimethate (COLYMYCIN) injection 150 mg  Status:  Discontinued     150 mg Intravenous Every 12 hours 06/17/18 1604 06/17/18 1612   06/17/18 2200  colistimethate ((COLYMYCIN-M)) nebulized solution 150 mg  Status:  Discontinued     150 mg Inhalation 2 times daily 06/17/18 1613 06/17/18 1624   06/17/18 2200  eravacycline (XERAVA) 50 mg in sodium chloride 0.9 % 150 mL IVPB  Status:  Discontinued     50 mg 150 mL/hr over 60 Minutes Intravenous Every 12 hours 06/17/18 1615 06/17/18 1617   06/17/18 2000  colistimethate ((COLYMYCIN-M)) nebulized solution 150 mg     150 mg Inhalation 2 times daily 06/17/18 1638     06/17/18 1630  eravacycline (XERAVA) 56 mg in sodium chloride 0.9 % 150 mL IVPB     1 mg/kg  56.3 kg 150 mL/hr over 60 Minutes Intravenous  Once 06/17/18 1617 06/17/18 1744   06/17/18 1500  eravacycline (XERAVA) 56 mg in sodium chloride 0.9 % 150 mL IVPB  Status:  Discontinued     1 mg/kg  56.3 kg 150 mL/hr over 60 Minutes Intravenous Every 12 hours 06/17/18 1309 06/17/18 1615   06/16/18 2000  DAPTOmycin (CUBICIN) 375 mg in sodium chloride 0.9 % IVPB     375 mg 215 mL/hr over 30 Minutes Intravenous Every 24 hours  06/15/18 0905 07/18/18 1959   06/14/18 1600  meropenem (MERREM) 1 g in sodium chloride 0.9 % 100 mL IVPB  Status:  Discontinued     1 g 200 mL/hr over 30 Minutes Intravenous Every 12 hours 06/14/18 1450 06/16/18 1107   06/14/18 1200  ertapenem (INVANZ) 1,000 mg in sodium chloride 0.9 % 100 mL IVPB  Status:  Discontinued     1 g 200 mL/hr over 30 Minutes Intravenous Every 24 hours 06/15/2018 2354 06/14/18 1450   06/14/18 1200  ceFEPIme (MAXIPIME) 1 g in sodium chloride 0.9 % 100 mL IVPB  Status:  Discontinued     1 g 200 mL/hr over 30 Minutes Intravenous Every 12 hours 06/14/18 0344 06/14/18 0923   06/14/18 1000  DAPTOmycin (CUBICIN) 375 mg in sodium chloride 0.9 % IVPB     375 mg 215 mL/hr over 30 Minutes Intravenous Every 24 hours 06/08/2018 2353 06/15/18 1138   06/14/18 0000  ertapenem (INVANZ) 1,000 mg in sodium chloride 0.9 % 100 mL IVPB  Status:  Discontinued     1 g 200 mL/hr over 30 Minutes Intravenous Every 24 hours 06/12/2018 2353 06/03/2018 2354   06/05/2018 2215  ceFEPIme (MAXIPIME) 1 g in sodium chloride 0.9 % 100 mL IVPB     1 g 200 mL/hr over  30 Minutes Intravenous  Once 06/10/2018 2203 06/09/2018 2326   06/26/2018 2215  vancomycin (VANCOCIN) IVPB 1000 mg/200 mL premix  Status:  Discontinued     1,000 mg 200 mL/hr over 60 Minutes Intravenous  Once 05/31/2018 2203 06/01/2018 2353      Medications:  Scheduled: . apixaban  5 mg Per Tube BID  . aspirin EC  81 mg Oral Daily  . chlorhexidine gluconate (MEDLINE KIT)  15 mL Mouth Rinse BID  . Chlorhexidine Gluconate Cloth  6 each Topical Q0600  . colistimethate  150 mg Inhalation BID  . feeding supplement (PRO-STAT SUGAR FREE 64)  30 mL Per Tube BID  . feeding supplement (VITAL AF 1.2 CAL)  1,000 mL Per Tube Q24H  . free water  200 mL Per Tube Q8H  . mouth rinse  15 mL Mouth Rinse 10 times per day  . midazolam  2 mg Intravenous Once  . midodrine  10 mg Per Tube TID WC  . nutrition supplement (JUVEN)  1 packet Per Tube BID BM  . sodium  chloride flush  10-40 mL Intracatheter Q12H    Objective: Vital signs in last 24 hours: Temp:  [97.6 F (36.4 C)-100.3 F (37.9 C)] 99.3 F (37.4 C) (09/19 0800) Pulse Rate:  [59-120] 108 (09/19 0800) Resp:  [16-36] 24 (09/19 0800) BP: (72-116)/(42-78) 84/59 (09/19 0800) SpO2:  [74 %-100 %] 100 % (09/19 0800) FiO2 (%):  [40 %-100 %] 50 % (09/19 0906) Weight:  [55.4 kg] 55.4 kg (09/19 0500)   General appearance: alert, cooperative, no distress and on vent Resp: rhonchi anterior - bilateral Cardio: tachycardia GI: normal findings: soft, non-tender and abnormal findings:  hypoactive bowel sounds  Lab Results Recent Labs    06/17/18 0423 06/18/18 0528  WBC 14.6* 11.9*  HGB 8.0* 8.2*  HCT 28.9* 30.0*  NA 150* 152*  K 3.9 3.7  CL 118* 122*  CO2 24 26  BUN 44* 52*  CREATININE 0.46 0.52   Liver Panel No results for input(s): PROT, ALBUMIN, AST, ALT, ALKPHOS, BILITOT, BILIDIR, IBILI in the last 72 hours. Sedimentation Rate No results for input(s): ESRSEDRATE in the last 72 hours. C-Reactive Protein No results for input(s): CRP in the last 72 hours.  Microbiology: Recent Results (from the past 240 hour(s))  Culture, blood (Routine x 2)     Status: None   Collection Time: 06/28/2018  1:39 PM  Result Value Ref Range Status   Specimen Description BLOOD RIGHT ANTECUBITAL  Final   Special Requests   Final    BOTTLES DRAWN AEROBIC AND ANAEROBIC Blood Culture results may not be optimal due to an inadequate volume of blood received in culture bottles   Culture   Final    NO GROWTH 5 DAYS Performed at [facility], [place] [address] [place], [place] [address]    Report Status 06/18/2018 FINAL  Final  Culture, blood (Routine x 2)     Status: None   Collection Time: 06/24/2018  2:02 PM  Result Value Ref Range Status   Specimen Description BLOOD BLOOD RIGHT HAND  Final   Special Requests   Final    BOTTLES DRAWN AEROBIC AND ANAEROBIC Blood Culture adequate volume   Culture    Final    NO GROWTH 5 DAYS Performed at [facility], [place] [address] [place], [place] [address]    Report Status 06/18/2018 FINAL  Final  Culture, Urine     Status: Abnormal   Collection Time: 06/14/18  3:22 AM  Result Value Ref Range Status   Specimen Description URINE, RANDOM  Final   Special Requests   Final    NONE Performed at [facility], [address], [place], [place] [address]    Culture MULTIPLE SPECIES PRESENT, SUGGEST RECOLLECTION (A)  Final   Report Status 06/15/2018 FINAL  Final  Respiratory Panel by PCR     Status: None   Collection Time: 06/14/18  4:59 AM  Result Value Ref Range Status   Adenovirus NOT DETECTED NOT DETECTED Final   Coronavirus 229E NOT DETECTED NOT DETECTED Final   Coronavirus HKU1 NOT DETECTED NOT DETECTED Final   Coronavirus NL63 NOT DETECTED NOT DETECTED Final   Coronavirus OC43 NOT DETECTED NOT DETECTED Final   Metapneumovirus NOT DETECTED NOT DETECTED Final   Rhinovirus / Enterovirus NOT DETECTED NOT DETECTED Final   Influenza A NOT DETECTED NOT DETECTED Final   Influenza B NOT DETECTED NOT DETECTED Final   Parainfluenza Virus 1 NOT DETECTED NOT DETECTED Final   Parainfluenza Virus 2 NOT DETECTED NOT DETECTED Final   Parainfluenza Virus 3 NOT DETECTED NOT DETECTED Final   Parainfluenza Virus 4 NOT DETECTED NOT DETECTED Final   Respiratory Syncytial Virus NOT DETECTED NOT DETECTED Final   Bordetella pertussis NOT DETECTED NOT DETECTED Final   Chlamydophila pneumoniae NOT DETECTED NOT DETECTED Final   Mycoplasma pneumoniae NOT DETECTED NOT DETECTED Final    Comment: Performed at [facility], [place] [address] [place], [place] [address]  MRSA PCR Screening     Status: None   Collection Time: 06/14/18  8:51 AM  Result Value Ref Range Status   MRSA by PCR NEGATIVE NEGATIVE Final    Comment:        The GeneXpert MRSA Assay (FDA approved for NASAL specimens only), is one component of a comprehensive MRSA  colonization surveillance program. It is not intended to diagnose MRSA infection nor to guide or monitor treatment for MRSA infections. Performed at [facility], [place] [address], [place], [place] [address]   Culture, respiratory     Status: None (Preliminary result)   Collection Time: 06/15/18  3:05 AM  Result Value Ref Range Status   Specimen Description TRACHEAL ASPIRATE  Final   Special Requests NONE  Final   Gram Stain   Final    FEW WBC PRESENT, PREDOMINANTLY PMN FEW GRAM NEGATIVE RODS    Culture   Final    MODERATE ACINETOBACTER CALCOACETICUS/BAUMANNII COMPLEX Sent to [facility] for further susceptibility testing. Performed at [facility], [place] [address] [place], [place] [address]    Report Status PENDING  Incomplete   Organism ID, Bacteria ACINETOBACTER CALCOACETICUS/BAUMANNII COMPLEX  Final      Susceptibility   Acinetobacter calcoaceticus/baumannii complex - MIC*    CEFTAZIDIME >=64 RESISTANT Resistant     CEFTRIAXONE >=64 RESISTANT Resistant     CIPROFLOXACIN >=4 RESISTANT Resistant     GENTAMICIN >=16 RESISTANT Resistant     IMIPENEM >=16 RESISTANT Resistant     PIP/TAZO >=128 RESISTANT Resistant     TRIMETH/SULFA >=320 RESISTANT Resistant     CEFEPIME >=64 RESISTANT Resistant     AMPICILLIN/SULBACTAM RESISTANT Resistant     * MODERATE ACINETOBACTER CALCOACETICUS/BAUMANNII COMPLEX    Studies/Results: Dg Chest Port 1 View  Result Date: 06/17/2018 CLINICAL DATA:  Hypoxia EXAM: PORTABLE CHEST 1 VIEW COMPARISON:  CT 06/14/2018, radiograph 06/14/2018, 06/24/2018, 05/14/2018 FINDINGS: Endotracheal tube tip is about 15 mm superior to the carina. Esophageal  tube tip is below the diaphragm. Left-sided central venous catheter tip over the SVC. Right lung is clear. No change in small left pleural effusion and airspace disease in the lingula and left base. IMPRESSION: 1. Endotracheal tube tip about 1.5 cm superior to the carina 2. No significant change in small left  pleural effusion and left basilar airspace disease suspicious for a pneumonia. Electronically Signed   By: Donavan Foil M.D.   On: 06/17/2018 17:07     Assessment/Plan: MDR acinetobacter  Staph capitis PJI, resected (9-5)             End date 07-17-18 Paraplegia Sacral decubitus Ao Aneurysm ? MV veg on TTE  Total days of antibiotics: 1 eravacycline, colistin (inhaled)  dapto (end date 07-17-18)  FiO2 unchanged Afebrile, WBC slightly better CXR unchanged.  Will aim for 8 days of eravacycline and colistin Then resume her invanz and dapto to complete the previously specified course  There is no comment on her MV veg? On her [facility] TTE on 9-4. Thickening is mentioned. Would rec TEE to complete this eval. Her BCx have been negative here. She has been on broad spectrum anbx for several weeks. It is possible she seeded her valve when she had Staph capitis bacteremia. Her anbx course would not change from my perspective.  F/u with [facility] ID as per previous  Will follow peripherally.          Bobby Rumpf MD, FACP Infectious Diseases (pager) [phone] [email or web address] 06/18/2018, 9:59 AM  LOS: 5 days

## 2018-06-18 NOTE — Progress Notes (Signed)
[facility] Coordinator will follow pt to support [facility] care for long term IV ABX at DC if needed.    If patient discharges after hours, please call [phone].   Larry Sierras 06/18/2018, 8:43 AM

## 2018-06-18 NOTE — Progress Notes (Signed)
Sharon Warner  NWG:[id number] DOB: 01/08/1951 DOA: 06/25/2018 PCP: [name], MD    LOS: 5 days   Reason for Consult / Chief Complaint:  Hypotension   Consulting MD and date:  [name] - [facility]. 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  Presents to ED 9/14 with acute dyspnea. Husband states that this states symptoms started at 0900. Recent decrease in Lasix from 40 to 25 mg. CXR with no acute infiltrate. On arrival oxygen saturation 100%. BP 97/70. Afebrile, LA 1.3. Given 40 meq lasix with 825 ml output. BP decreased to 78/60. Given 500 ml bolus without improvement. PCCM consulted for evaluation.  On arrival to bedside patient alert and oriented. Fluid bolus still going. BP now 90/65. [facility] asked to admit. Shortly after called back as patient was stated to be lethargic with BP 65/28, additional fluid bolus being administered without improvement. Plans to start Levophed and transfer to ICU. On arrival to ICU patient alert and oriented with BP 98/71 without pressors.   Intubated for increased work of breathing.  Spoke to husband who encapsulated her history as follows: was in normal state of health until [facility] 2005. 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  Increasing debility with multiple hospitalizations with worsening sacral decubitus. Had been healing until patient stopped eating due to difficulty swallowing over a year ago. Recent hospitalization at [facility] for removal of protruding hardware.   Subjective:    Continued vasopressor requirements. Didn't tolerate pressure  support trial today. Minimal return from [place].  Objective   Blood pressure (!) 84/59, pulse (!) 108, temperature 99.3 F (37.4 C), temperature source Oral, resp. rate (!) 24, height 5\' 1"  (1.549 m), weight 55.4 kg, SpO2 100 %.    Vent Mode: PRVC FiO2 (%):  [40 %-100 %] 50 % Set Rate:  [15 bmp] 15 bmp Vt Set:  [390 mL] 390 mL PEEP:  [5 cmH20] 5 cmH20 Pressure Support:  [14 cmH20] 14 cmH20 Plateau Pressure:  [14 cmH20-17 cmH20] 14 cmH20   Intake/Output Summary (Last 24 hours) at 06/18/2018 1052 Last data filed at 06/18/2018 0900 Gross per 24 hour  Intake 1862.34 ml  Output 1660 ml  Net 202.34 ml   Filed Weights   06/16/18 0415 06/17/18 0431 06/18/18 0500  Weight: 55.7 kg 56.3 kg 55.4 kg    Examination: General: chronically ill appearing female in NAD lying in bed HEENT: MM pink/moist, coating on lips, JVP flat, temporal wasting Neuro: opens eyes to voice, faint/raspy voice but speech clear, oriented, generalized weakness CV: s1s2 rrr, no m/r/g. Extremities warm. PULM: even/non-labored, lungs bilaterally diminished.  Tolerating PSV 10/5 but SBI>105 on 5/5 ON:GEXB, non-tender, bsx4 active  Extremities: warm/dry, 1-2+ generalized pitting edema.  Marked muscle wasting. Skin: no rashes or lesions.  No purulent drainage from wound VAC. Small eschars over left shoulder and both ischii. Large stage IV would in mid-back but with no drainage.   Consults: date of consult/date signed off & final recs:   Procedures: Left PICC >>   Resolved Hospital Problem list     Assessment & Plan:   Acute on  Chronic Hypotension Baseline Systolic 90 Increased home midodrine. Add hydrocortisone and fludrocortisone. Will wean NE to MAP of 60 given borderline BP at baseline. Likely related to malnutrition with persistent capillary leak..  Acute respiratory failure Currently not tolerating SBT trials, but suspect component of neuromuscular weakness as cause of in respiratory failure. CT shows  either pneumonia or simple atelectasis.  Will treat for Acinetobacter pneumonia. Daily SBT and begin mobilization.  Recent Bacteremia with Staphylococcus capitis secondary to infected right hip hardware  Continue Daptomycin  planned stop date 10/18.   Stage 4 ulcer to sacrum - present on admit Shoulder Decubitus Ulcers - present on admit  Surgical wound to right thigh with VAC placement  P: WOC following, appreciate input  Patient was due for dressing change 9/15   Neurogenic Bladder with Chronic Foley  Hx Recurrent UTI  P: Continue foley catheter  Catheter care per protocol  Recent Diagnosis Right LE DVT  P: Continue Eliquis   Severe Protein-Calorie Malnutrition  At Risk Dysphagia / Aspiration Suspect that this is at the core of her failure to thrive. P: On enteral nutrition with micronutrient supplementation. Need to reassess nutritional adequacy at this time.  Will recheck inflammatory mediators to assess whether patient is likely to respond to anabolic stimulation.   Hypernatremia from high insensitive losses.  Will increase fee water further.   Disposition / Summary of Today's Plan 06/18/18   Continue to wean pressors. Feeding and daily SBT.    DVT prophylaxis: Eliquis  GI prophylaxis: N/A  Diet: severe nutritional risk. On tube feeds at goal. Juven for micronutrients and HMB supplementation Mobility: up to chair. Code Status: Full Code  Family Communication: no family at bedside today.  Labs and diagnostic studies   Improving leukocytosis at 11.9 Stable anemia of chronic disease at 8.2 Mild hypokalemia. - replaced. Mild hypernatremia at 152 - increasing. Blood  cultures are negative. Few WBC on sputum Gram stain. Sputum positive for pan-resistant A baumanii MRSA PCR is negative.  CT chest (personally reviewed) - left retrocardiac consolidation consistent with either pneumonia or atelectasis.  CRITICAL CARE Performed by: Kipp Brood   Total critical  care time: 40 minutes  Critical care time was exclusive of separately billable procedures and treating other patients.  Critical care was necessary to treat or prevent imminent or life-threatening deterioration.  Critical care was time spent personally by me on the following activities: development of treatment plan with patient and/or surrogate as well as nursing, discussions with consultants, evaluation of patient's response to treatment, examination of patient, obtaining history from patient or surrogate, ordering and performing treatments and interventions, ordering and review of laboratory studies, ordering and review of radiographic studies, pulse oximetry and re-evaluation of patient's condition.   Kipp Brood, MD [facility] ICU Physician [place]  Pager: [phone] Mobile: [phone] After hours: [phone].

## 2018-06-19 DIAGNOSIS — Z1619 Resistance to other specified beta lactam antibiotics: Secondary | ICD-10-CM

## 2018-06-19 DIAGNOSIS — A498 Other bacterial infections of unspecified site: Secondary | ICD-10-CM

## 2018-06-19 LAB — GLUCOSE, CAPILLARY
GLUCOSE-CAPILLARY: 149 mg/dL — AB (ref 70–99)
Glucose-Capillary: 153 mg/dL — ABNORMAL HIGH (ref 70–99)
Glucose-Capillary: 141 mg/dL — ABNORMAL HIGH (ref 70–99)
Glucose-Capillary: 135 mg/dL — ABNORMAL HIGH (ref 70–99)
Glucose-Capillary: 123 mg/dL — ABNORMAL HIGH (ref 70–99)
Glucose-Capillary: 114 mg/dL — ABNORMAL HIGH (ref 70–99)

## 2018-06-19 LAB — COMPREHENSIVE METABOLIC PANEL
ALK PHOS: 80 U/L (ref 38–126)
ALT: 29 U/L (ref 0–44)
ANION GAP: 8 (ref 5–15)
AST: 25 U/L (ref 15–41)
Albumin: 2 g/dL — ABNORMAL LOW (ref 3.5–5.0)
BILIRUBIN TOTAL: 0.4 mg/dL (ref 0.3–1.2)
BUN: 64 mg/dL — ABNORMAL HIGH (ref 8–23)
CO2: 24 mmol/L (ref 22–32)
Calcium: 10.4 mg/dL — ABNORMAL HIGH (ref 8.9–10.3)
Chloride: 124 mmol/L — ABNORMAL HIGH (ref 98–111)
Creatinine, Ser: 0.55 mg/dL (ref 0.44–1.00)
GFR calc non Af Amer: >60 mL/min (ref >60)
Glucose, Bld: 182 mg/dL — ABNORMAL HIGH (ref 70–99)
Potassium: 3.6 mmol/L (ref 3.5–5.1)
SODIUM: 156 mmol/L — AB (ref 135–145)
TOTAL PROTEIN: 5.1 g/dL — AB (ref 6.5–8.1)

## 2018-06-19 LAB — CBC WITH DIFFERENTIAL/PLATELET
Abs Immature Granulocytes: 0.1 10*3/uL (ref 0.0–0.1)
BASOS ABS: 0 10*3/uL (ref 0.0–0.1)
Basophils Relative: 0 %
EOS ABS: 0 10*3/uL (ref 0.0–0.7)
Eosinophils Relative: 0 %
HEMATOCRIT: 28 % — AB (ref 36.0–46.0)
Hemoglobin: 7.6 g/dL — ABNORMAL LOW (ref 12.0–15.0)
Immature Granulocytes: 1 %
Lymphocytes Relative: 47 %
Lymphs Abs: 4.2 10*3/uL — ABNORMAL HIGH (ref 0.7–4.0)
MCH: 23 pg — AB (ref 26.0–34.0)
MCHC: 27.1 g/dL — AB (ref 30.0–36.0)
MCV: 84.6 fL (ref 78.0–100.0)
MONOS PCT: 4 %
Monocytes Absolute: 0.4 10*3/uL (ref 0.1–1.0)
NEUTROS ABS: 4.2 10*3/uL (ref 1.7–7.7)
Neutrophils Relative %: 48 %
Platelets: 249 10*3/uL (ref 150–400)
RBC: 3.31 MIL/uL — ABNORMAL LOW (ref 3.87–5.11)
RDW: 20.2 % — ABNORMAL HIGH (ref 11.5–15.5)
WBC: 8.8 10*3/uL (ref 4.0–10.5)

## 2018-06-19 LAB — C-REACTIVE PROTEIN: CRP: 6.2 mg/dL — AB (ref <1.0)

## 2018-06-19 LAB — PREALBUMIN: Prealbumin: <5 mg/dL — ABNORMAL LOW (ref 18–38)

## 2018-06-19 LAB — PROCALCITONIN: PROCALCITONIN: 0.34 ng/mL

## 2018-06-19 MED ORDER — FAMOTIDINE 40 MG/5ML PO SUSR
20.0000 mg | Freq: Two times a day (BID) | ORAL | Status: DC
  Administered 2018-06-19 – 2018-06-21 (×5): 20 mg
  Filled 2018-06-19 (×5): qty 2.5

## 2018-06-19 MED ORDER — FREE WATER
200.0000 mL | Freq: Four times a day (QID) | Status: DC
  Administered 2018-06-19 – 2018-06-21 (×9): 200 mL

## 2018-06-19 MED ORDER — INSULIN ASPART 100 UNIT/ML ~~LOC~~ SOLN
0.0000 [IU] | SUBCUTANEOUS | Status: DC
  Administered 2018-06-19 (×3): 1 [IU] via SUBCUTANEOUS

## 2018-06-19 MED ORDER — ASPIRIN 81 MG PO CHEW
81.0000 mg | CHEWABLE_TABLET | Freq: Every day | ORAL | Status: DC
  Administered 2018-06-19 – 2018-06-21 (×3): 81 mg
  Filled 2018-06-19 (×2): qty 1

## 2018-06-19 NOTE — Progress Notes (Signed)
Sharon Warner  SEG:[id number] DOB: [birth year] DOA: 06/07/2018 PCP: [name], MD    LOS: 6 days   Reason for Consult / Chief Complaint:  Hypotension   Consulting MD and date:  [name] - TR[facility]. 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  Presents to ED 9/14 with acute dyspnea. Husband states that this states symptoms started at 0900. Recent decrease in Lasix from 40 to 25 mg. CXR with no acute infiltrate. On arrival oxygen saturation 100%. BP 97/70. Afebrile, LA 1.3. Given 40 meq lasix with 825 ml output. BP decreased to 78/60. Given 500 ml bolus without improvement. PCCM consulted for evaluation.  On arrival to bedside patient alert and oriented. Fluid bolus still going. BP now 90/65. [facility] asked to admit. Shortly after called back as patient was stated to be lethargic with BP 65/28, additional fluid bolus being administered without improvement. Plans to start Levophed and transfer to ICU. On arrival to ICU patient alert and oriented with BP 98/71 without pressors.   Intubated for increased work of breathing.  Spoke to husband who encapsulated her history as follows: was in normal state of health until [facility] 2005. 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  Increasing debility with multiple hospitalizations with worsening sacral decubitus. Had been healing until patient stopped eating due to difficulty swallowing over a year ago. Recent hospitalization at [facility] for removal of protruding hardware.   Subjective:    Weaning pressors.  Tolerating SBT 10/5.  Complains of sore  throat.  Objective   Blood pressure 103/69, pulse 61, temperature 99 F (37.2 C), temperature source Oral, resp. rate (!) 22, height 5\' 1"  (1.549 m), weight 53.3 kg, SpO2 100 %.    Vent Mode: PSV;CPAP FiO2 (%):  [40 %-50 %] 40 % Set Rate:  [15 bmp] 15 bmp Vt Set:  [390 mL] 390 mL PEEP:  [5 cmH20] 5 cmH20 Pressure Support:  [10 cmH20] 10 cmH20 Plateau Pressure:  [14 cmH20-18 cmH20] 15 cmH20   Intake/Output Summary (Last 24 hours) at 06/19/2018 0924 Last data filed at 06/19/2018 0800 Gross per 24 hour  Intake 1883.63 ml  Output 1625 ml  Net 258.63 ml   Filed Weights   06/17/18 0431 06/18/18 0500 06/19/18 0428  Weight: 56.3 kg 55.4 kg 53.3 kg    Examination: General: chronically ill appearing female in NAD lying in bed HEENT: MM pink/dryt, coating on lips, JVP flat, temporal wasting. No pressure ulceration around ETT or OGT.  Neuro: opens eyes to voice, generalized weakness CV: s1s2 rrr, no m/r/g. Extremities warm. PULM: even/non-labored, lungs with bronchial breathing L base..  Tolerating PSV 10/5  GG:YIRS, non-tender, bsx4 active. Normal ostomy output  Extremities: warm/dry, 1-2+ generalized pitting edema.  Marked muscle wasting. Skin: no rashes or lesions.  No purulent drainage from wound VAC. Small eschars over left shoulder and both ischii. Large stage IV would in mid-back but with no drainage.   Consults: date of consult/date signed off & final recs:   Procedures: Left PICC >>   Resolved Hospital Problem list     Assessment & Plan:   Acute  on Chronic Hypotension Baseline Systolic 90 Increased home midodrine. Added hydrocortisone and fludrocortisone. Continue to wean NE Likely related to malnutrition with persistent capillary leak..  Acute respiratory failure Currently not tolerating SBT trials, but suspect component of neuromuscular weakness as cause of in respiratory failure. CT shows either pneumonia or simple atelectasis.  Will treat for Acinetobacter  pneumonia. Daily SBT and begin mobilization.  Recent Bacteremia with Staphylococcus capitis secondary to infected right hip hardware  Continue Daptomycin  planned stop date 10/18.   Stage 4 ulcer to sacrum - present on admit Shoulder Decubitus Ulcers - present on admit  Surgical wound to right thigh with VAC placement  P: WOC following, appreciate input  Patient was due for dressing change 9/15   Neurogenic Bladder with Chronic Foley  Hx Recurrent UTI  P: Continue foley catheter  Catheter care per protocol  Recent Diagnosis Right LE DVT  Continue Eliquis    Severe Protein-Calorie Malnutrition  At Risk Dysphagia / Aspiration Suspect that this is at the core of her failure to thrive. On enteral nutrition with micronutrient supplementation. Need to reassess nutritional adequacy at this time.  Will recheck inflammatory mediators to assess whether patient is likely to respond to anabolic stimulation.   Hypernatremia from high insensitive losses.  Will increase fee water further.   Disposition / Summary of Today's Plan 06/19/18   Continue to wean pressors. Feeding and daily SBT.    DVT prophylaxis: Eliquis  GI prophylaxis: N/A  Diet: severe nutritional risk. On tube feeds at goal. Juven for micronutrients and HMB supplementation Mobility: up to chair. Code Status: Full Code  Family Communication: no family at bedside today.  Labs and diagnostic studies   Leukocytosis has resolved with WBC 8.8 Slight worsening anemia of chronic disease at 7.6 Mild hypokalemia. - resolved Mild hypernatremia at 156 - increasing.  Blood  cultures are negative. Few WBC on sputum Gram stain. Sputum positive for pan-resistant A baumanii MRSA PCR is negative.  CT chest (personally reviewed) - left retrocardiac consolidation consistent with either pneumonia or atelectasis.  CRITICAL CARE Performed by: Kipp Brood   Total critical care time: 40 minutes  Critical care time was exclusive  of separately billable procedures and treating other patients.  Critical care was necessary to treat or prevent imminent or life-threatening deterioration.  Critical care was time spent personally by me on the following activities: development of treatment plan with patient and/or surrogate as well as nursing, discussions with consultants, evaluation of patient's response to treatment, examination of patient, obtaining history from patient or surrogate, ordering and performing treatments and interventions, ordering and review of laboratory studies, ordering and review of radiographic studies, pulse oximetry and re-evaluation of patient's condition.   Kipp Brood, MD [facility] ICU Physician [place]  Pager: [phone] Mobile: [phone] After hours: [phone].

## 2018-06-19 NOTE — Progress Notes (Signed)
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 Intubated for increased work of breathing secondary to acute hypotension.     PT Comments    Pt vitals stable and appropriate for more mobility today. Given sacral wounds therapist utilized chair position of bed to come to seated. In seated worked on [employer] of UE and trunk strength with maxAx2 to bring back off of bed surface. With vc pt able to lift head and turn head.  D/c plans remain appropriate at this time. PT will follow acutely.   Follow Up Recommendations  LTACH     Equipment Recommendations  None recommended by PT       Precautions / Restrictions Precautions Precautions: Fall Restrictions Weight Bearing Restrictions: No    Mobility  Bed Mobility Overal bed mobility: Needs Assistance Bed Mobility: Supine to Sit     Supine to sit: Max assist;+2 for physical assistance     General bed mobility comments: utilized chair position of bed to bring pt to seated and then utilized maxAx2 to lift back from bed surface                                       Cognition Arousal/Alertness: Awake/alert Behavior During Therapy: WFL for tasks assessed/performed Overall Cognitive Status: Difficult to assess                                        Exercises General Exercises - Upper Extremity Shoulder Flexion: AAROM;Both;5 reps Elbow Flexion: AAROM;Both;5 reps Elbow Extension: AAROM;Both;5 reps Digit Composite Flexion: AROM;Both;5 reps Other Exercises Other Exercises: lifting back off of bed  surface in chair position x3 for 30 sec each, requiring maxAx2 for support     General Comments General comments (skin integrity, edema, etc.): Pt on ventilator assist with FiO2 40%O2 PEEP 5 SaO2 >90% throughout session, VSS       Pertinent Vitals/Pain Faces Pain Scale: Hurts a little bit Pain Location: UE with movement            PT Goals (current goals can now be found in the care plan section) Acute Rehab PT Goals Patient Stated Goal: none stated PT Goal Formulation: With patient/family Time For Goal Achievement: 07/01/18 Progress towards PT goals: Progressing toward goals    Frequency    Min 2X/week      PT Plan Current plan remains appropriate       AM-PAC PT "6 Clicks" Daily Activity  Outcome Measure  Difficulty turning over in bed (including adjusting bedclothes, sheets and blankets)?: Unable Difficulty moving from lying on back to sitting on the side of the bed? : Unable Difficulty sitting down on and standing up from a chair with arms (e.g., wheelchair, bedside commode, etc,.)?: Unable Help needed moving to and from a bed to chair (including a wheelchair)?: Total Help needed walking in hospital room?: Total Help needed climbing 3-5 steps with a railing? : Total 6 Click Score: 6  End of Session Equipment Utilized During Treatment: Oxygen Activity Tolerance: Treatment limited secondary to medical complications (Comment) Patient left: in bed;with call bell/phone within reach;with family/visitor present Nurse Communication: Other (comment)(O2 desaturation) PT Visit Diagnosis: Dizziness and giddiness (R42);Other symptoms and signs involving the nervous system (R29.898);Muscle weakness (generalized) (M62.81)     Time: 1572-6203 PT Time Calculation (min) (ACUTE ONLY): 21 min  Charges:  $Therapeutic Exercise: 8-22 mins                     Leather Estis B. Migdalia Dk PT, DPT Acute Rehabilitation Services Pager [phone] Office [phone]    [place] 06/19/2018, 3:39 PM

## 2018-06-19 NOTE — Progress Notes (Signed)
Cortrak Tube Team Note:  Consult received to place a Cortrak feeding tube.   A 10 F Cortrak tube was placed in the LEFT nare and secured with a nasal bridle at 69 cm. Per the Cortrak monitor reading the tube tip is gastric, at the pylorus.   No x-ray is required. RN may begin using tube.   If the tube becomes dislodged please keep the tube and contact the Cortrak team at [email or web address] ([email or web address]) for replacement.  If after hours and replacement cannot be delayed, place a NG tube and confirm placement with an abdominal x-ray.    Gaynell Face, MS, RD, LDN Inpatient Clinical Dietitian Pager: [phone] Weekend/After Hours: [phone]

## 2018-06-19 NOTE — Progress Notes (Signed)
Pharmacy Antibiotic Note  Sharon Warner is a 67 y.o. female admitted on 06/18/2018 with sepsis  Presented with SOB, known sacrum ulcer, patient is paralyzed from waist down. Per notes from [facility] was being treated with antibiotics for chronic R hip wound s/p hip replacement and stage 4 sacral ulcer PTA.   Her respiratory culture grew MDR acinetobacter. Sent to [place] for further susceptibility testing.  WBC has been trending down: 31>20>11.9>8.8  Weekly CK:  9/18 = 50  Plan: -eravacycline 50 mg IV q12h through 9/26 -colistimethate 150 mg inhalation BID through 9/26 -daptomycin 375 mg IV q24h through 10/18 -resume ertapenem starting 9/27 -Monitor renal fx, cultures, CK qWeek  Height: 5\' 1"  (154.9 cm) Weight: 117 lb 8.1 oz (53.3 kg) IBW/kg (Calculated) : 47.8  Temp (24hrs), Avg:98.8 F (37.1 C), Min:97.5 F (36.4 C), Max:100.9 F (38.3 C)  Recent Labs  Lab 06/20/2018 1402 06/14/18 0047  06/15/18 0436 06/16/18 0358 06/16/18 1351 06/17/18 0423 06/18/18 0528 06/19/18 0419  WBC 9.6  --    < > 31.3* 20.1*  --  14.6* 11.9* 8.8  CREATININE 0.39*  --    < > 0.49 0.49 0.50 0.46 0.52 0.55  LATICACIDVEN 1.33 1.0  --   --   --   --   --   --   --    < > = values in this interval not displayed.     Antimicrobials this admission: 9/14 cefepime x1 9/15 meropenem >> 9/17 9/15 ertapenem x1 (restart 9/27) 9/15 daptomycin >> 10/18 9/18 inhaled colistin 9/18 >> 9/26 9/18 eravacycline 9/18 >> 9/26  Dose adjustments this admission: N/A   Microbiology results: 9/14 TAE:WYBR 9/14 KVT:XLEZVGJF species 9/15 RVP: neg 9/15 MRSA pcr: neg 9/16 resp cx: MDR acinetobacter  Vertis Kelch, PharmD PGY1 Pharmacy Resident Phone [phone] 06/19/2018       1:45 PM

## 2018-06-20 LAB — CBC WITH DIFFERENTIAL/PLATELET
Abs Immature Granulocytes: 0.1 10*3/uL (ref 0.0–0.1)
BASOS ABS: 0 10*3/uL (ref 0.0–0.1)
BASOS PCT: 0 %
EOS ABS: 0.2 10*3/uL (ref 0.0–0.7)
Eosinophils Relative: 2 %
HCT: 27.1 % — ABNORMAL LOW (ref 36.0–46.0)
Hemoglobin: 7.2 g/dL — ABNORMAL LOW (ref 12.0–15.0)
Immature Granulocytes: 1 %
Lymphocytes Relative: 43 %
Lymphs Abs: 3.1 10*3/uL (ref 0.7–4.0)
MCH: 22.9 pg — AB (ref 26.0–34.0)
MCHC: 26.6 g/dL — AB (ref 30.0–36.0)
MCV: 86.3 fL (ref 78.0–100.0)
MONO ABS: 0.4 10*3/uL (ref 0.1–1.0)
MONOS PCT: 6 %
NEUTROS ABS: 3.5 10*3/uL (ref 1.7–7.7)
Neutrophils Relative %: 48 %
PLATELETS: 256 10*3/uL (ref 150–400)
RBC: 3.14 MIL/uL — ABNORMAL LOW (ref 3.87–5.11)
RDW: 20.1 % — AB (ref 11.5–15.5)
WBC: 7.3 10*3/uL (ref 4.0–10.5)

## 2018-06-20 LAB — GLUCOSE, CAPILLARY
GLUCOSE-CAPILLARY: 107 mg/dL — AB (ref 70–99)
GLUCOSE-CAPILLARY: 107 mg/dL — AB (ref 70–99)
GLUCOSE-CAPILLARY: 114 mg/dL — AB (ref 70–99)
Glucose-Capillary: 105 mg/dL — ABNORMAL HIGH (ref 70–99)
Glucose-Capillary: 113 mg/dL — ABNORMAL HIGH (ref 70–99)
Glucose-Capillary: 108 mg/dL — ABNORMAL HIGH (ref 70–99)

## 2018-06-20 LAB — PROCALCITONIN: PROCALCITONIN: 0.18 ng/mL

## 2018-06-20 NOTE — Progress Notes (Signed)
Metaneb being held at this time due to patient weaning. Seems to be doing well, and I did not want to change trigger for treatment. Currently on 5/5

## 2018-06-20 NOTE — Progress Notes (Signed)
Sharon Warner  BLT:[id number] DOB: 27-Apr-1951 DOA: 06/18/2018 PCP: [name], MD    LOS: 7 days   Reason for Consult / Chief Complaint:  Hypotension   Consulting MD and date:  [name] - TRH. 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  Intubated for increased work of breathing.  Decline largely attributed to severe protein calorie malnutrition with profound neuromuscular weakness.  Subsequently found to have pan resistant Acinetobacter infection.  Seen by infectious diseases and started on Xerava.  Spoke to husband who encapsulated her history as follows: was in normal state of health until [facility] 2005. 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  Increasing debility with multiple hospitalizations with worsening sacral decubitus. Had been healing until patient stopped eating due to difficulty swallowing over a year ago. Recent hospitalization at [facility] for removal of protruding hardware.   Subjective:    Generally off pressors most of the day.  Tolerated pressure support 5/5 since morning.  Reporting some fatigue at the end of her trial.    Objective   Blood pressure 90/63, pulse (!) 109, temperature 98.5 F (36.9 C), temperature source Oral, resp. rate 19, height 5\' 1"  (1.549 m), weight 53.5 kg, SpO2 100 %.    Vent Mode: CPAP;PSV FiO2 (%):  [40 %] 40 % Set Rate:  [15 bmp] 15 bmp Vt Set:  [390 mL] 390 mL PEEP:  [5 cmH20] 5 cmH20 Pressure Support:  [5 AUQ33-35 cmH20] 5 cmH20 Plateau Pressure:  [16 cmH20-18 cmH20] 16 cmH20   Intake/Output Summary (Last 24 hours) at  06/20/2018 1742 Last data filed at 06/20/2018 1700 Gross per 24 hour  Intake 2071.59 ml  Output 2320 ml  Net -248.41 ml   Filed Weights   06/18/18 0500 06/19/18 0428 06/20/18 0600  Weight: 55.4 kg 53.3 kg 53.5 kg    Examination: General: chronically ill appearing female in NAD lying in bed. Color has improved since admission. HEENT: MM pink/dryt, coating on lips, JVP flat, temporal wasting. No pressure ulceration around ETT or OGT.  Neuro: opens eyes to voice, generalized weakness CV: s1s2 rrr, no m/r/g. Extremities warm. PULM: even/non-labored, lungs with bronchial breathing L base..  Tolerating PSV 5/5  KT:GYBW, non-tender, bsx4 active. Normal ostomy output  Extremities: warm/dry, 1-2+ generalized pitting edema.  Marked muscle wasting. Skin: no rashes or lesions.  No purulent drainage from wound VAC. Small eschars over left shoulder and both ischii. Large stage IV would in mid-back but with no drainage.   Consults: date of consult/date signed off & final recs:    Procedures: Left PICC >>  Infectious diseases Resolved Hospital Problem list     Assessment & Plan:   Acute on Chronic Hypotension Baseline Systolic 90 Likely related to malnutrition with persistent capillary leak.  Improving with initiation of Midrin, Florinef, hydrocortisone and improved nutrition and possible resolution of low-grade pneumonia.  Acute respiratory failure Currently on treatment for Acinetobacter pneumonia.  Also large component of neuromuscular weakness.  Patient has surprisingly done well from weaning standpoint today. Trial of extubation tomorrow.  Recent Bacteremia with Staphylococcus capitis secondary to infected right hip  hardware  Daptomycin had course has been completed.  No signs of active infection at this time  Stage 4 ulcer to sacrum - present on admit Shoulder Decubitus Ulcers - present on admit  Surgical wound to right thigh with VAC placement  All wounds appear clean and  noninfected. WOC following, appreciate input  Patient was due for dressing change 9/15   Neurogenic Bladder with Chronic Foley  Hx Recurrent UTI  P: Continue foley catheter  Catheter care per protocol  Recent Diagnosis Right LE DVT  Continue Eliquis    Severe Protein-Calorie Malnutrition  At Risk Dysphagia / Aspiration Suspect that this is at the core of her failure to thrive. On enteral nutrition with micronutrient supplementation. Need to reassess nutritional adequacy at this time.  Will recheck inflammatory mediators to assess whether patient is likely to respond to anabolic stimulation.   Hypernatremia from high insensitive losses.  Will increase fee water further.   Disposition / Summary of Today's Plan 06/20/18   SBT and potential trial of extubation tomorrow.    DVT prophylaxis: Eliquis  GI prophylaxis: N/A  Diet: severe nutritional risk. On tube feeds at goal. Juven for micronutrients and HMB supplementation Mobility: up to chair. Code Status: Full Code  Family Communication: Husband updated at bedside.  Labs and diagnostic studies   Leukocytosis has resolved with WBC 7.8 Slight worsening anemia of chronic disease at 72 Mild hypokalemia. - resolved Mild hypernatremia at 156 - increasing.  Blood  cultures are negative. Few WBC on sputum Gram stain. Sputum positive for pan-resistant A baumanii MRSA PCR is negative.  CT chest (personally reviewed) - left retrocardiac consolidation consistent with either pneumonia or atelectasis.  CRITICAL CARE Performed by: Kipp Brood   Total critical care time: 40 minutes  Critical care time was exclusive of separately billable procedures and treating other patients.  Critical care was necessary to treat or prevent imminent or life-threatening deterioration.  Critical care was time spent personally by me on the following activities: development of treatment plan with patient and/or surrogate as well as nursing,  discussions with consultants, evaluation of patient's response to treatment, examination of patient, obtaining history from patient or surrogate, ordering and performing treatments and interventions, ordering and review of laboratory studies, ordering and review of radiographic studies, pulse oximetry and re-evaluation of patient's condition.   Kipp Brood, MD [facility] ICU Physician [place]  Pager: [phone] Mobile: [phone] After hours: [phone].

## 2018-06-21 LAB — CBC WITH DIFFERENTIAL/PLATELET
Abs Immature Granulocytes: 0 10*3/uL (ref 0.0–0.1)
Basophils Absolute: 0 10*3/uL (ref 0.0–0.1)
Basophils Relative: 0 %
Eosinophils Absolute: 0 10*3/uL (ref 0.0–0.7)
Eosinophils Relative: 0 %
HEMATOCRIT: 27.2 % — AB (ref 36.0–46.0)
Hemoglobin: 7.1 g/dL — ABNORMAL LOW (ref 12.0–15.0)
IMMATURE GRANULOCYTES: 1 %
LYMPHS ABS: 2.4 10*3/uL (ref 0.7–4.0)
Lymphocytes Relative: 44 %
MCH: 22.9 pg — ABNORMAL LOW (ref 26.0–34.0)
MCHC: 26.1 g/dL — ABNORMAL LOW (ref 30.0–36.0)
MCV: 87.7 fL (ref 78.0–100.0)
MONOS PCT: 5 %
Monocytes Absolute: 0.3 10*3/uL (ref 0.1–1.0)
NEUTROS ABS: 2.8 10*3/uL (ref 1.7–7.7)
NEUTROS PCT: 50 %
PLATELETS: 237 10*3/uL (ref 150–400)
RBC: 3.1 MIL/uL — ABNORMAL LOW (ref 3.87–5.11)
RDW: 20.3 % — ABNORMAL HIGH (ref 11.5–15.5)
WBC: 5.5 10*3/uL (ref 4.0–10.5)

## 2018-06-21 LAB — GLUCOSE, CAPILLARY
GLUCOSE-CAPILLARY: 116 mg/dL — AB (ref 70–99)
GLUCOSE-CAPILLARY: 98 mg/dL (ref 70–99)
Glucose-Capillary: 104 mg/dL — ABNORMAL HIGH (ref 70–99)

## 2018-06-21 LAB — BASIC METABOLIC PANEL
BUN: 77 mg/dL — ABNORMAL HIGH (ref 8–23)
CO2: 23 mmol/L (ref 22–32)
Calcium: 9.6 mg/dL (ref 8.9–10.3)
Chloride: >130 mmol/L (ref 98–111)
Creatinine, Ser: 0.49 mg/dL (ref 0.44–1.00)
GFR calc Af Amer: >60 mL/min (ref >60)
GLUCOSE: 114 mg/dL — AB (ref 70–99)
POTASSIUM: 2.8 mmol/L — AB (ref 3.5–5.1)
Sodium: 161 mmol/L (ref 135–145)

## 2018-06-21 MED ORDER — FENTANYL CITRATE (PF) 100 MCG/2ML IJ SOLN
INTRAMUSCULAR | Status: AC
  Filled 2018-06-21: qty 4

## 2018-06-21 MED ORDER — MORPHINE 100MG IN NS 100ML (1MG/ML) PREMIX INFUSION
10.0000 mg/h | INTRAVENOUS | Status: DC
  Administered 2018-06-21: 10 mg/h via INTRAVENOUS
  Filled 2018-06-21: qty 100

## 2018-06-21 MED ORDER — MORPHINE BOLUS VIA INFUSION
5.0000 mg | INTRAVENOUS | Status: DC | PRN
  Administered 2018-06-21 (×2): 5 mg via INTRAVENOUS
  Filled 2018-06-21: qty 20

## 2018-06-21 MED ORDER — ORAL CARE MOUTH RINSE: 15.0000 mL | Freq: Two times a day (BID) | OROMUCOSAL | Status: DC

## 2018-06-21 MED ORDER — MIDAZOLAM HCL 2 MG/2ML IJ SOLN
INTRAMUSCULAR | Status: AC
  Filled 2018-06-21: qty 4

## 2018-06-21 MED ORDER — MORPHINE SULFATE (PF) 2 MG/ML IV SOLN
2.0000 mg | Freq: Once | INTRAVENOUS | Status: AC
  Administered 2018-06-21: 2 mg via INTRAVENOUS
  Filled 2018-06-21: qty 1

## 2018-06-21 MED ORDER — CHLORHEXIDINE GLUCONATE 0.12 % MT SOLN: 15.0000 mL | Freq: Two times a day (BID) | OROMUCOSAL | Status: DC

## 2018-06-22 LAB — SUSCEPTIBILITY RESULT

## 2018-06-22 LAB — SUSCEPTIBILITY, AER + ANAEROB

## 2018-06-24 ENCOUNTER — Telehealth: Payer: Self-pay | Admitting: Pulmonary Disease

## 2018-06-24 NOTE — Telephone Encounter (Signed)
Received death certificate for cremation from [place], sending to Dr Lynetta Mare at [facility] for signing 06/24/18  LM

## 2018-06-25 NOTE — Telephone Encounter (Signed)
Received signed death certificate back from Dr Lynetta Mare, called [employer] spoke to Delilah Shan to let them know it is ready for pickup 06/25/18  LM
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
## 2018-06-30 NOTE — Procedures (Signed)
Extubation Procedure Note  Patient Details:   Name: Sharon Warner DOB: 02-Jun-1951 MRN: [id number]   Airway Documentation:    Vent end date: 2018/07/02 Vent end time: 1018   Evaluation  O2 sats: stable throughout Complications: No apparent complications Patient did tolerate procedure well. Bilateral Breath Sounds: Clear   Yes   Extubated per MD order at this time. Positive cuff leak. Weak cough, but able to clear secretions. Currently on 4LNC  Saunders Glance 07/02/2018, 10:20 AM
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
## 2018-06-30 NOTE — Progress Notes (Signed)
Sharon Warner witnessed Sharon Warner waste 49mL of morphine in the sink. Clint Bolder, RN 2018-07-14 9:47 PM

## 2018-06-30 NOTE — Progress Notes (Signed)
Patient transitioned to comfort care. Chaplain at bedside for family support. Morphine gtt started, patient states she is not in any pain. Is resting comfortably. Patient and family consoled and support given. Will continue to monitor.

## 2018-06-30 NOTE — Progress Notes (Signed)
Chaplain made follow-up visit as pt was actively dying.  Stayed with husband, and son, and friends as the patient passed, offering scripture, prayer, and support. Rabbi came to support family after pt passed.  [place] [facility]

## 2018-06-30 NOTE — Progress Notes (Signed)
Pts son returned to pick up patients belongings. All belongings returned to pts son, including 2 bags 1 cell phone and phone charger. Clint Bolder, RN 2018-06-29 9:48 PM

## 2018-06-30 NOTE — Progress Notes (Addendum)
eLink Physician-Brief Progress Note Patient Name: Sharon Warner DOB: [birth year] MRN: [id number]   Date of Service  July 08, 2018  HPI/Events of Note  Hgb = 7.1.  eICU Interventions  Will order Type and Screen now.      Intervention Category Major Interventions: Other:  Barbara Ahart Cornelia Copa 07/08/18, 6:42 AM

## 2018-06-30 NOTE — Progress Notes (Signed)
Weaned for 1 hour on 5/5 per MD and then placed back on full support. Did very well. Will speak to MD

## 2018-06-30 NOTE — Progress Notes (Signed)
Chaplain responded to call for Comfort Care.  Patient had made the decision to no longer receive treatment, but husband was struggling  "not ready to let go." Chaplain offered support and words of scripture for husband.  He is [place], she, [place]. Chaplain offered sacred words and prayer with husband and son and patient.   [facility]

## 2018-06-30 DEATH — deceased

## 2018-07-06 LAB — CULTURE, RESPIRATORY

## 2018-07-06 LAB — CULTURE, RESPIRATORY W GRAM STAIN

## 2019-05-27 ENCOUNTER — Ambulatory Visit: Payer: Medicare Other | Admitting: Neurology
